# Patient Record
Sex: Male | Born: 1955 | Race: Black or African American | Hispanic: No | Marital: Single | Smoking: Former smoker
Health system: Southern US, Community
[De-identification: ages and names within clinical notes are randomized; demographics above are authoritative.]

## PROBLEM LIST (undated history)

## (undated) DIAGNOSIS — I5032 Chronic diastolic (congestive) heart failure: Secondary | ICD-10-CM

## (undated) DIAGNOSIS — Z972 Presence of dental prosthetic device (complete) (partial): Secondary | ICD-10-CM

## (undated) DIAGNOSIS — F1490 Cocaine use, unspecified, uncomplicated: Secondary | ICD-10-CM

## (undated) DIAGNOSIS — Z8679 Personal history of other diseases of the circulatory system: Secondary | ICD-10-CM

## (undated) DIAGNOSIS — N138 Other obstructive and reflux uropathy: Secondary | ICD-10-CM

## (undated) DIAGNOSIS — N189 Chronic kidney disease, unspecified: Secondary | ICD-10-CM

## (undated) DIAGNOSIS — N320 Bladder-neck obstruction: Secondary | ICD-10-CM

## (undated) DIAGNOSIS — D735 Infarction of spleen: Secondary | ICD-10-CM

## (undated) DIAGNOSIS — K759 Inflammatory liver disease, unspecified: Secondary | ICD-10-CM

## (undated) DIAGNOSIS — Z87898 Personal history of other specified conditions: Secondary | ICD-10-CM

## (undated) DIAGNOSIS — M1990 Unspecified osteoarthritis, unspecified site: Secondary | ICD-10-CM

## (undated) DIAGNOSIS — F1020 Alcohol dependence, uncomplicated: Secondary | ICD-10-CM

## (undated) DIAGNOSIS — E785 Hyperlipidemia, unspecified: Secondary | ICD-10-CM

## (undated) DIAGNOSIS — D563 Thalassemia minor: Secondary | ICD-10-CM

## (undated) DIAGNOSIS — N529 Male erectile dysfunction, unspecified: Secondary | ICD-10-CM

## (undated) DIAGNOSIS — F1411 Cocaine abuse, in remission: Secondary | ICD-10-CM

## (undated) DIAGNOSIS — C61 Malignant neoplasm of prostate: Secondary | ICD-10-CM

## (undated) DIAGNOSIS — J45909 Unspecified asthma, uncomplicated: Secondary | ICD-10-CM

## (undated) DIAGNOSIS — I10 Essential (primary) hypertension: Secondary | ICD-10-CM

## (undated) DIAGNOSIS — N401 Enlarged prostate with lower urinary tract symptoms: Secondary | ICD-10-CM

## (undated) HISTORY — PX: PROSTATE BIOPSY: SHX241

## (undated) HISTORY — PX: OTHER SURGICAL HISTORY: SHX169

## (undated) HISTORY — PX: TOTAL KNEE ARTHROPLASTY: SHX125

---
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
## 1998-03-07 ENCOUNTER — Emergency Department (HOSPITAL_COMMUNITY): Admission: EM | Admit: 1998-03-07 | Discharge: 1998-03-07 | Payer: Self-pay | Admitting: Emergency Medicine

## 2003-03-13 ENCOUNTER — Emergency Department (HOSPITAL_COMMUNITY): Admission: EM | Admit: 2003-03-13 | Discharge: 2003-03-13 | Payer: Self-pay | Admitting: Emergency Medicine

## 2006-11-03 ENCOUNTER — Emergency Department (HOSPITAL_COMMUNITY): Admission: EM | Admit: 2006-11-03 | Discharge: 2006-11-04 | Payer: Self-pay | Admitting: Emergency Medicine

## 2007-05-03 ENCOUNTER — Emergency Department (HOSPITAL_COMMUNITY): Admission: EM | Admit: 2007-05-03 | Discharge: 2007-05-03 | Payer: Self-pay | Admitting: Emergency Medicine

## 2007-06-04 ENCOUNTER — Emergency Department (HOSPITAL_COMMUNITY): Admission: EM | Admit: 2007-06-04 | Discharge: 2007-06-04 | Payer: Self-pay | Admitting: Emergency Medicine

## 2007-11-08 ENCOUNTER — Emergency Department (HOSPITAL_COMMUNITY): Admission: EM | Admit: 2007-11-08 | Discharge: 2007-11-08 | Payer: Self-pay | Admitting: Emergency Medicine

## 2008-04-23 ENCOUNTER — Emergency Department (HOSPITAL_COMMUNITY): Admission: EM | Admit: 2008-04-23 | Discharge: 2008-04-23 | Payer: Self-pay | Admitting: Emergency Medicine

## 2008-11-06 ENCOUNTER — Emergency Department (HOSPITAL_COMMUNITY): Admission: EM | Admit: 2008-11-06 | Discharge: 2008-11-06 | Payer: Self-pay | Admitting: Emergency Medicine

## 2009-08-21 ENCOUNTER — Encounter: Admission: RE | Admit: 2009-08-21 | Discharge: 2009-10-07 | Payer: Self-pay | Admitting: Orthopaedic Surgery

## 2010-08-25 ENCOUNTER — Ambulatory Visit (HOSPITAL_COMMUNITY): Admission: RE | Admit: 2010-08-25 | Discharge: 2010-08-25 | Payer: Self-pay | Admitting: Orthopaedic Surgery

## 2010-12-23 LAB — BODY FLUID CULTURE: Culture: NO GROWTH

## 2010-12-23 LAB — SYNOVIAL CELL COUNT + DIFF, W/ CRYSTALS: WBC, Synovial: 85 /mm3 (ref 0–200)

## 2011-06-24 ENCOUNTER — Other Ambulatory Visit (HOSPITAL_COMMUNITY): Payer: Self-pay | Admitting: Orthopaedic Surgery

## 2011-06-24 DIAGNOSIS — T148XXA Other injury of unspecified body region, initial encounter: Secondary | ICD-10-CM

## 2011-07-10 ENCOUNTER — Ambulatory Visit (HOSPITAL_COMMUNITY): Payer: Self-pay

## 2011-07-10 ENCOUNTER — Encounter (HOSPITAL_COMMUNITY)
Admission: RE | Admit: 2011-07-10 | Discharge: 2011-07-10 | Disposition: A | Discharge status: Home / Self Care | Payer: Medicaid Other | Source: Ambulatory Visit | Attending: Orthopaedic Surgery | Admitting: Orthopaedic Surgery

## 2011-07-10 DIAGNOSIS — T148XXA Other injury of unspecified body region, initial encounter: Secondary | ICD-10-CM

## 2011-07-10 DIAGNOSIS — IMO0002 Reserved for concepts with insufficient information to code with codable children: Secondary | ICD-10-CM | POA: Insufficient documentation

## 2011-07-10 DIAGNOSIS — M1710 Unilateral primary osteoarthritis, unspecified knee: Secondary | ICD-10-CM | POA: Insufficient documentation

## 2011-07-10 MED ORDER — TECHNETIUM TC 99M MEDRONATE IV KIT
25.0000 | PACK | Freq: Once | INTRAVENOUS | Status: AC | PRN
  Administered 2011-07-10: 25 via INTRAVENOUS

## 2011-07-24 LAB — URIC ACID: Uric Acid, Serum: 6.6

## 2011-07-24 LAB — SEDIMENTATION RATE: Sed Rate: 1

## 2011-07-24 LAB — SYNOVIAL CELL COUNT + DIFF, W/ CRYSTALS
Eosinophils-Synovial: 1
Lymphocytes-Synovial Fld: 14
Monocyte-Macrophage-Synovial Fluid: 81
Neutrophil, Synovial: 4
WBC, Synovial: 510 — ABNORMAL HIGH

## 2011-07-24 LAB — DIFFERENTIAL
Basophils Absolute: 1 — ABNORMAL HIGH
Lymphocytes Relative: 43
Lymphs Abs: 2.4
Neutro Abs: 1.5 — ABNORMAL LOW

## 2011-07-24 LAB — CBC
Platelets: 207
RDW: 16.4 — ABNORMAL HIGH
WBC: 5.6

## 2011-07-24 LAB — BODY FLUID CULTURE: Gram Stain: NONE SEEN

## 2011-09-08 ENCOUNTER — Emergency Department (HOSPITAL_COMMUNITY): Admission: EM | Admit: 2011-09-08 | Discharge: 2011-09-08 | Discharge status: Absent without leave | Payer: Self-pay

## 2015-11-26 ENCOUNTER — Emergency Department (HOSPITAL_COMMUNITY): Payer: Medicaid Other

## 2015-11-26 ENCOUNTER — Observation Stay (HOSPITAL_COMMUNITY)
Admission: EM | Admit: 2015-11-26 | Discharge: 2015-11-28 | Disposition: A | Discharge status: Home / Self Care | Payer: Medicaid Other | Attending: Internal Medicine | Admitting: Internal Medicine

## 2015-11-26 ENCOUNTER — Encounter (HOSPITAL_COMMUNITY): Payer: Self-pay

## 2015-11-26 DIAGNOSIS — Z7901 Long term (current) use of anticoagulants: Secondary | ICD-10-CM | POA: Diagnosis not present

## 2015-11-26 DIAGNOSIS — R1013 Epigastric pain: Secondary | ICD-10-CM | POA: Diagnosis present

## 2015-11-26 DIAGNOSIS — D563 Thalassemia minor: Secondary | ICD-10-CM | POA: Diagnosis present

## 2015-11-26 DIAGNOSIS — F1410 Cocaine abuse, uncomplicated: Secondary | ICD-10-CM | POA: Diagnosis not present

## 2015-11-26 DIAGNOSIS — E8729 Other acidosis: Secondary | ICD-10-CM | POA: Diagnosis present

## 2015-11-26 DIAGNOSIS — I2510 Atherosclerotic heart disease of native coronary artery without angina pectoris: Secondary | ICD-10-CM | POA: Insufficient documentation

## 2015-11-26 DIAGNOSIS — I10 Essential (primary) hypertension: Secondary | ICD-10-CM | POA: Diagnosis not present

## 2015-11-26 DIAGNOSIS — E872 Acidosis: Secondary | ICD-10-CM

## 2015-11-26 DIAGNOSIS — Z7982 Long term (current) use of aspirin: Secondary | ICD-10-CM | POA: Diagnosis not present

## 2015-11-26 DIAGNOSIS — D735 Infarction of spleen: Principal | ICD-10-CM | POA: Insufficient documentation

## 2015-11-26 DIAGNOSIS — F10220 Alcohol dependence with intoxication, uncomplicated: Secondary | ICD-10-CM

## 2015-11-26 DIAGNOSIS — D509 Iron deficiency anemia, unspecified: Secondary | ICD-10-CM | POA: Diagnosis not present

## 2015-11-26 DIAGNOSIS — F1911 Other psychoactive substance abuse, in remission: Secondary | ICD-10-CM | POA: Diagnosis present

## 2015-11-26 DIAGNOSIS — R718 Other abnormality of red blood cells: Secondary | ICD-10-CM | POA: Diagnosis not present

## 2015-11-26 DIAGNOSIS — F1020 Alcohol dependence, uncomplicated: Secondary | ICD-10-CM | POA: Diagnosis not present

## 2015-11-26 HISTORY — DX: Essential (primary) hypertension: I10

## 2015-11-26 LAB — COMPREHENSIVE METABOLIC PANEL
ALT: 27 U/L (ref 17–63)
ANION GAP: 20 — AB (ref 5–15)
AST: 31 U/L (ref 15–41)
Albumin: 3.7 g/dL (ref 3.5–5.0)
Alkaline Phosphatase: 54 U/L (ref 38–126)
BUN: 14 mg/dL (ref 6–20)
CHLORIDE: 107 mmol/L (ref 101–111)
CO2: 18 mmol/L — ABNORMAL LOW (ref 22–32)
Calcium: 9.6 mg/dL (ref 8.9–10.3)
Creatinine, Ser: 0.82 mg/dL (ref 0.61–1.24)
Glucose, Bld: 134 mg/dL — ABNORMAL HIGH (ref 65–99)
POTASSIUM: 3.9 mmol/L (ref 3.5–5.1)
Sodium: 145 mmol/L (ref 135–145)
Total Bilirubin: 0.7 mg/dL (ref 0.3–1.2)
Total Protein: 7.2 g/dL (ref 6.5–8.1)

## 2015-11-26 LAB — I-STAT TROPONIN, ED: Troponin i, poc: 0 ng/mL (ref 0.00–0.08)

## 2015-11-26 LAB — URINALYSIS, ROUTINE W REFLEX MICROSCOPIC
Bilirubin Urine: NEGATIVE
GLUCOSE, UA: NEGATIVE mg/dL
Hgb urine dipstick: NEGATIVE
Ketones, ur: 15 mg/dL — AB
LEUKOCYTES UA: NEGATIVE
NITRITE: NEGATIVE
PH: 7 (ref 5.0–8.0)
Protein, ur: NEGATIVE mg/dL
SPECIFIC GRAVITY, URINE: 1.023 (ref 1.005–1.030)

## 2015-11-26 LAB — CBC
HEMATOCRIT: 40.9 % (ref 39.0–52.0)
HEMOGLOBIN: 13.4 g/dL (ref 13.0–17.0)
MCH: 21.6 pg — ABNORMAL LOW (ref 26.0–34.0)
MCHC: 32.8 g/dL (ref 30.0–36.0)
MCV: 65.9 fL — AB (ref 78.0–100.0)
Platelets: 187 10*3/uL (ref 150–400)
RBC: 6.21 MIL/uL — AB (ref 4.22–5.81)
RDW: 17.3 % — ABNORMAL HIGH (ref 11.5–15.5)
WBC: 6.3 10*3/uL (ref 4.0–10.5)

## 2015-11-26 LAB — LIPASE, BLOOD: LIPASE: 21 U/L (ref 11–51)

## 2015-11-26 LAB — RAPID URINE DRUG SCREEN, HOSP PERFORMED
Amphetamines: NOT DETECTED
BARBITURATES: NOT DETECTED
Benzodiazepines: NOT DETECTED
Cocaine: POSITIVE — AB
Opiates: NOT DETECTED
TETRAHYDROCANNABINOL: NOT DETECTED

## 2015-11-26 LAB — LACTIC ACID, PLASMA: LACTIC ACID, VENOUS: 1.5 mmol/L (ref 0.5–2.0)

## 2015-11-26 LAB — ETHANOL: Alcohol, Ethyl (B): 107 mg/dL — ABNORMAL HIGH (ref <5)

## 2015-11-26 MED ORDER — OXYCODONE-ACETAMINOPHEN 5-325 MG PO TABS
1.0000 | ORAL_TABLET | Freq: Once | ORAL | Status: AC
Start: 2015-11-26 — End: 2015-11-26
  Administered 2015-11-26: 1 via ORAL

## 2015-11-26 MED ORDER — ASPIRIN 81 MG PO CHEW
81.0000 mg | CHEWABLE_TABLET | Freq: Every day | ORAL | Status: DC
  Administered 2015-11-26 – 2015-11-27 (×2): 81 mg via ORAL
  Filled 2015-11-26 (×2): qty 1

## 2015-11-26 MED ORDER — PANTOPRAZOLE SODIUM 40 MG PO TBEC
40.0000 mg | DELAYED_RELEASE_TABLET | Freq: Once | ORAL | Status: AC
  Administered 2015-11-26: 40 mg via ORAL
  Filled 2015-11-26: qty 1

## 2015-11-26 MED ORDER — THIAMINE HCL 100 MG/ML IJ SOLN: 100.0000 mg | Freq: Every day | INTRAMUSCULAR | Status: DC

## 2015-11-26 MED ORDER — LORAZEPAM 1 MG PO TABS: 1.0000 mg | ORAL_TABLET | Freq: Four times a day (QID) | ORAL | Status: DC | PRN

## 2015-11-26 MED ORDER — GI COCKTAIL ~~LOC~~
30.0000 mL | Freq: Once | ORAL | Status: AC
  Administered 2015-11-26: 30 mL via ORAL
  Filled 2015-11-26: qty 30

## 2015-11-26 MED ORDER — DIPHENHYDRAMINE HCL 25 MG PO CAPS
50.0000 mg | ORAL_CAPSULE | Freq: Once | ORAL | Status: AC
  Administered 2015-11-26: 50 mg via ORAL
  Filled 2015-11-26: qty 2

## 2015-11-26 MED ORDER — SODIUM CHLORIDE 0.9 % IV SOLN
Freq: Once | INTRAVENOUS | Status: AC
  Administered 2015-11-26: 19:00:00 via INTRAVENOUS

## 2015-11-26 MED ORDER — HYDROMORPHONE HCL 1 MG/ML IJ SOLN
0.5000 mg | Freq: Once | INTRAMUSCULAR | Status: AC
  Administered 2015-11-26: 0.5 mg via INTRAVENOUS
  Filled 2015-11-26: qty 1

## 2015-11-26 MED ORDER — OXYCODONE-ACETAMINOPHEN 5-325 MG PO TABS
ORAL_TABLET | ORAL | Status: AC
  Filled 2015-11-26: qty 1

## 2015-11-26 MED ORDER — IOHEXOL 300 MG/ML  SOLN
100.0000 mL | Freq: Once | INTRAMUSCULAR | Status: AC | PRN
  Administered 2015-11-26: 100 mL via INTRAVENOUS

## 2015-11-26 MED ORDER — SODIUM CHLORIDE 0.9 % IV SOLN
INTRAVENOUS | Status: DC
  Administered 2015-11-27 (×2): via INTRAVENOUS

## 2015-11-26 MED ORDER — SODIUM CHLORIDE 0.9 % IV BOLUS (SEPSIS)
1000.0000 mL | Freq: Once | INTRAVENOUS | Status: AC
  Administered 2015-11-26: 1000 mL via INTRAVENOUS

## 2015-11-26 MED ORDER — ENOXAPARIN SODIUM 40 MG/0.4ML ~~LOC~~ SOLN
40.0000 mg | SUBCUTANEOUS | Status: DC
  Administered 2015-11-26: 40 mg via SUBCUTANEOUS
  Filled 2015-11-26: qty 0.4

## 2015-11-26 MED ORDER — HYDROMORPHONE HCL 1 MG/ML IJ SOLN
1.0000 mg | Freq: Once | INTRAMUSCULAR | Status: AC
  Administered 2015-11-26: 1 mg via INTRAVENOUS
  Filled 2015-11-26: qty 1

## 2015-11-26 MED ORDER — OXYCODONE HCL 5 MG PO TABS: 5.0000 mg | ORAL_TABLET | ORAL | Status: DC | PRN

## 2015-11-26 MED ORDER — DIPHENHYDRAMINE HCL 25 MG PO CAPS
25.0000 mg | ORAL_CAPSULE | ORAL | Status: DC | PRN
  Administered 2015-11-27 (×2): 25 mg via ORAL
  Filled 2015-11-26 (×3): qty 1

## 2015-11-26 MED ORDER — HYDROMORPHONE HCL 1 MG/ML IJ SOLN
1.0000 mg | INTRAMUSCULAR | Status: DC | PRN
  Administered 2015-11-26: 1 mg via INTRAVENOUS
  Filled 2015-11-26: qty 1

## 2015-11-26 MED ORDER — VITAMIN B-1 100 MG PO TABS
100.0000 mg | ORAL_TABLET | Freq: Every day | ORAL | Status: DC
  Administered 2015-11-26 – 2015-11-28 (×3): 100 mg via ORAL
  Filled 2015-11-26 (×3): qty 1

## 2015-11-26 MED ORDER — HYDROCHLOROTHIAZIDE 12.5 MG PO CAPS
12.5000 mg | ORAL_CAPSULE | Freq: Every day | ORAL | Status: DC
  Administered 2015-11-26 – 2015-11-28 (×3): 12.5 mg via ORAL
  Filled 2015-11-26 (×3): qty 1

## 2015-11-26 MED ORDER — ADULT MULTIVITAMIN W/MINERALS CH
1.0000 | ORAL_TABLET | Freq: Every day | ORAL | Status: DC
  Administered 2015-11-26 – 2015-11-28 (×3): 1 via ORAL
  Filled 2015-11-26 (×3): qty 1

## 2015-11-26 MED ORDER — FOLIC ACID 1 MG PO TABS
1.0000 mg | ORAL_TABLET | Freq: Every day | ORAL | Status: DC
  Administered 2015-11-26 – 2015-11-28 (×3): 1 mg via ORAL
  Filled 2015-11-26 (×3): qty 1

## 2015-11-26 MED ORDER — LORAZEPAM 2 MG/ML IJ SOLN: 1.0000 mg | Freq: Four times a day (QID) | INTRAMUSCULAR | Status: DC | PRN

## 2015-11-26 MED ORDER — LORAZEPAM 2 MG/ML IJ SOLN
1.0000 mg | Freq: Once | INTRAMUSCULAR | Status: AC
  Administered 2015-11-26: 1 mg via INTRAVENOUS
  Filled 2015-11-26: qty 1

## 2015-11-26 NOTE — ED Notes (Signed)
Patient here with acute onset on epigastric pain this am, reports cramping and sharp. No nausea, no vomiting, no diarrhea. Reports that he drinks daily

## 2015-11-26 NOTE — ED Notes (Signed)
Pt returns from ct  

## 2015-11-26 NOTE — ED Provider Notes (Signed)
CSN: [id number]     Arrival date & time 11/26/15  1215 History   First MD Initiated Contact with Patient 11/26/15 1519     Chief Complaint  Patient presents with  . Abdominal Pain   Patient is a 60 y.o. male presenting with abdominal pain. The history is provided by the patient.  Abdominal Pain Pain location:  Epigastric Pain quality: aching and sharp   Pain radiates to:  Does not radiate Pain severity:  Severe Onset quality:  Gradual Duration:  1 day Timing:  Intermittent Progression:  Unchanged Chronicity:  New Context: alcohol use   Context: not awakening from sleep, not diet changes, not eating, not previous surgeries and not sick contacts   Relieved by:  None tried Worsened by:  Nothing tried Ineffective treatments:  None tried Associated symptoms: no anorexia, no chest pain, no chills, no constipation, no cough, no diarrhea, no dysuria, no fever, no hematuria, no nausea, no shortness of breath and no vomiting   Risk factors: alcohol abuse   Risk factors: has not had multiple surgeries     Past Medical History  Diagnosis Date  . Hypertension    History reviewed. No pertinent past surgical history. No family history on file. Social History  Substance Use Topics  . Smoking status: Never Smoker   . Smokeless tobacco: None  . Alcohol Use: Yes    Review of Systems  Constitutional: Negative for fever and chills.  HENT: Negative for congestion and dental problem.   Eyes: Negative for pain, discharge and itching.  Respiratory: Negative for cough and shortness of breath.   Cardiovascular: Negative for chest pain.  Gastrointestinal: Positive for abdominal pain. Negative for nausea, vomiting, diarrhea, constipation, blood in stool and anorexia.  Endocrine: Negative for polydipsia and polyuria.  Genitourinary: Negative for dysuria, urgency, hematuria and decreased urine volume.  Musculoskeletal: Negative for neck pain and neck stiffness.  Skin: Negative for rash and wound.   Allergic/Immunologic: Negative for immunocompromised state.  Neurological: Negative for light-headedness, numbness and headaches.  Psychiatric/Behavioral: Negative for behavioral problems and agitation.  All other systems reviewed and are negative.     Allergies  Morphine and related  Home Medications   Prior to Admission medications   Not on File   BP 157/101 mmHg  Pulse 86  Temp(Src) 98.1 F (36.7 C) (Oral)  Resp 20  SpO2 99% Physical Exam  Constitutional: He is oriented to person, place, and time. He appears well-developed and well-nourished. No distress.  HENT:  Head: Normocephalic and atraumatic.  Eyes: Pupils are equal, round, and reactive to light.  Neck: Normal range of motion.  Cardiovascular: Normal rate and regular rhythm.  Exam reveals no friction rub.   No murmur heard. Pulmonary/Chest: Effort normal and breath sounds normal. No respiratory distress. He has no wheezes. He has no rales. He exhibits no tenderness.  Abdominal: Soft. Bowel sounds are normal. He exhibits no distension. There is no tenderness. There is no rebound and no guarding.  Genitourinary: Guaiac negative stool.  Musculoskeletal: Normal range of motion. He exhibits no edema or tenderness.  Neurological: He is alert and oriented to person, place, and time. No cranial nerve deficit. Coordination normal.  Skin: Skin is warm and dry. He is not diaphoretic.  Psychiatric: He has a normal mood and affect. His behavior is normal.  Vitals reviewed.   ED Course  Procedures (including critical care time) Labs Review Labs Reviewed  COMPREHENSIVE METABOLIC PANEL - Abnormal; Notable for the following:    CO2  18 (*)    Glucose, Bld 134 (*)    Anion gap 20 (*)    All other components within normal limits  CBC - Abnormal; Notable for the following:    RBC 6.21 (*)    MCV 65.9 (*)    MCH 21.6 (*)    RDW 17.3 (*)    All other components within normal limits  LIPASE, BLOOD  URINALYSIS, ROUTINE W  REFLEX MICROSCOPIC (NOT AT [facility])  I-STAT TROPOININ, ED  POC OCCULT BLOOD, ED    Imaging Review No results found. I have personally reviewed and evaluated these images and lab results as part of my medical decision-making.   EKG Interpretation None      MDM   Final diagnoses:  Epigastric pain   Patient is a 60 year old man who presents via private vehicle for evaluation treatment of epigastric pain that is intermittent, sharp in nature. It occurred at rest this morning. Patient never had this pain before. Patient denies any nausea vomiting or diarrhea.  Patient does have history of hypertension.  Upon arrival patient afebrile with heart rate 100, blood pressure 137/108. He has a soft nontender abdomen. Hemoccult negative.  Differential diagnosis includes pancreatitis versus gastritis. Do not suspect small bowel obstruction.  Patient given Percocet in triage with minimal relief. Patient given Maalox here with minimal relief. Patient also given Protonix emergency department.  Patient is a heavy alcohol user and drinks a sixpack as well as several 40s per day. Strongly suspect gastritis due to alcohol use. Patient given IV fluids, Ativan to prevent withdrawals and dilaudid for pain.  5:18 PM: Discussed case with hospitalist who requests CT abdomen and pelvis, UDS, ethanol level. Ordered these tests and will call back with results.  CT results showed acute per subacute splenic infarct. Discussed with hospitalist who will admit patient for fluid hydration, evaluation of splenic infarct. Patient given pain control while in the emergency department with Dilaudid.  He is admitted to the hospital service with no further emergency department events.  Discussed case with my attending Dr. Roxanne Mins.  [place] Blanco, [place] [phone]  Delora Fuel, [place] [id number] [phone]

## 2015-11-26 NOTE — H&P (Signed)
History and Physical  Patient Name: Bradley Ray     [id number]    DOB: 1956-05-09    DOA: 11/26/2015 Referring physician: Vira Blanco, MD PCP: Philis Fendt, MD      Chief Complaint: Epigastric pain  HPI: Bradley Ray is a 60 y.o. male with a past medical history significant for HTN and alcoholism who presents with acute epigastric pain.  The patient was in his usual state of health until this morning, when he had sudden onset epigastric pain.  The pain was severe, constant, and worsened through the day until he couldn't do anything but go to the ER.  It was not associated with vomiting, diarrhea, fever.  It was not worse with food.  In the ED, the patient was afebrile, tachycardic, and hypertensive.  K 3.9, HCO3 18, AG 20, Cr 0.8. Lipase normal.  AST/ALT and bili normal.  WBC 6.3, Hgb 13.4 but marked microcytosis.  TNI normal.  EtOH 107.  UDS positive for cocaine.  A CT of the abdomen and pelvis with contrast was remarkable only for a splenic infarct.  Fluids were administered, hydromorphone was given for pain control, and TRH were asked to evaluate for admission.  The patient reported ~2 pints of spirits plus 12 pack of beer plus 1-2 40 oz beverages daily.  He has a long history of alcohol.  He specifically denied cocaine and IVDU, despite UDS results.  No personal history of clot (except reported taking a blood thinner years ago in context of knee replacement, he is unclear if he had DVT/PE).  No family history of thrombosis.  Family history of sickle cell disease and trait, no known personal history.       Review of Systems:  Pt complains of abdominal pain, tremor, diaphoresis.  Some weight loss in context of depression.   Pt denies any dysphagia, unexplained weight loss, swollen glands, B symptoms.  All other systems negative except as just noted or noted in the history of present illness.  Allergies  Allergen Reactions  . Morphine And Related Rash    Prior to  Admission medications   Not on File    Past Medical History  Diagnosis Date  . Hypertension     Past Surgical History  Procedure Laterality Date  . Total knee arthroplasty      Family history: family history includes Arrhythmia in his mother; Pancreatic cancer in his sister; Sickle cell trait in his son; Throat cancer in his father.  No family history of thrombosis. No family history of anemia other than SCD.  Social History: Patient lives alone.  He is on Disability.  He drinks daily (see above).  He denies IVDU.  He walks without a cane or walker.    His partner of 22 years recently left him, and he has been depressed.     Physical Exam: BP 168/93 mmHg  Pulse 114  Temp(Src) 98.1 F (36.7 C) (Oral)  Resp 23  SpO2 96% General appearance: Well-developed, adult male, alert and in no acute distress.   Eyes: Anicteric, conjunctiva pink, lids and lashes normal.  Pupils small, symmetric. ENT: No nasal deformity, discharge, or epistaxis.  OP moist without lesions.   Lymph: No cervical, axillary or supraclavicular lymphadenopathy. Skin: Warm and dry.  No jaundice.  No suspicious rashes or lesions.  No lesions on hands/palms or feet. Cardiac: RRR, nl S1-S2, no murmurs appreciated at all.  Capillary refill is brisk.  JVP normal.  No LE edema.  Radial and  DP pulses 2+ and symmetric. Respiratory: Normal respiratory rate and rhythm.  CTAB without rales or wheezes. Abdomen: Abdomen soft without rigidity.  No real TTP, some voluntary guarding. No ascites, distension.   MSK: No deformities or effusions. Neuro: Cranial nerves all normal.  Sensorium intact and responding to questions, attention normal.  Speech is fluent.  Moves all extremities equally and with normal coordination.   Mild tremor, mild diaphoresis. Psych: Behavior appropriate.  Affect normal.  No evidence of aural or visual hallucinations or delusions.       Labs on Admission:  The metabolic panel shows normal sodium,  potassium, and creatinine. The bicarb is low.  The AG is elevated. Lipase normal EtOH 107 Troponin negative. UDS positive for cocaine. UA only positive for ketones. The complete blood count shows no leukocytosis.  There is microcytosis, marked, without anemia.  Normal platelets.   Radiological Exams on Admission: Ct Abdomen Pelvis W Contrast 11/26/2015 IMPRESSION: Findings consistent with a splenic infarct, likely acute or subacute. Cause for the infarct is not identified. No other acute abnormality is seen. Calcific aortic and coronary atherosclerosis. Small nonobstructing stone lower pole left kidney. Electronically Signed   By: Inge Rise M.D.   On: 11/26/2015 17:59    EKG: Independently reviewed. Sinus tachycardia, rate 106.  QTc long normal 490.  No ST changes.      Assessment/Plan 1. Splenic infarct:  This is new.    Possible diagnostic considerations include IVDU/endocarditis, embolic atherosclerosis.  No afib on ECG, and arrhythmia is doubted.  Cocaine alone or sickle cell trait alone seem unlikely to cause splenic infarct, but are possible.  No evidence of DVT or malignancy. -Echocardiogram -Blood cultures   2. Anion gap acidosis:  This is new.  Suspect alcoholic ketoacidosis/lactic acidosis. -Supportive care with fluids -Check lactate -Repeat BMP in AM  3. Microcytosis:  Thalassemia possible.  Seems out of proportiion for sickle cell trait. -Could pursue Hgb electrophoresis as outpatient  4. Alcoholism:  -SW consult for alcohol recovery resources  5. HTN:  -Restart home HCTZ and aspirin     DVT PPx: Lovenox Diet: Regular Consultants: SW Code Status: FULL Family Communication: Sister, present at bedside  Medical decision making: What exists of the patient's previous chart was reviewed in depth and the case was discussed with Dr. Adela Glimpse. Patient seen 8:05 PM on 11/26/2015.  Disposition Plan:  I recommend admission to med surg bed.  Clinical  condition: stable.  Anticipate observation overnight, follow blood cultures and echocardiogram.  If echo normal, could conceivably be discharged home tomorrow with PCP follow up, +/- Holter monitor.      Edwin Dada [facility] Pager [phone]

## 2015-11-27 ENCOUNTER — Other Ambulatory Visit: Payer: Self-pay | Admitting: Hematology and Oncology

## 2015-11-27 ENCOUNTER — Encounter: Payer: Self-pay | Admitting: Hematology and Oncology

## 2015-11-27 ENCOUNTER — Other Ambulatory Visit (HOSPITAL_COMMUNITY): Payer: Medicaid Other

## 2015-11-27 DIAGNOSIS — I10 Essential (primary) hypertension: Secondary | ICD-10-CM | POA: Diagnosis not present

## 2015-11-27 DIAGNOSIS — D735 Infarction of spleen: Principal | ICD-10-CM

## 2015-11-27 DIAGNOSIS — E860 Dehydration: Secondary | ICD-10-CM | POA: Diagnosis not present

## 2015-11-27 DIAGNOSIS — R718 Other abnormality of red blood cells: Secondary | ICD-10-CM | POA: Diagnosis not present

## 2015-11-27 DIAGNOSIS — R109 Unspecified abdominal pain: Secondary | ICD-10-CM | POA: Diagnosis not present

## 2015-11-27 DIAGNOSIS — F1490 Cocaine use, unspecified, uncomplicated: Secondary | ICD-10-CM

## 2015-11-27 LAB — IRON AND TIBC
Iron: 58 ug/dL (ref 45–182)
Saturation Ratios: 15 % — ABNORMAL LOW (ref 17.9–39.5)
TIBC: 388 ug/dL (ref 250–450)
UIBC: 330 ug/dL

## 2015-11-27 LAB — CBC WITH DIFFERENTIAL/PLATELET
BASOS ABS: 0 10*3/uL (ref 0.0–0.1)
Basophils Relative: 0 %
EOS PCT: 2 %
Eosinophils Absolute: 0.1 10*3/uL (ref 0.0–0.7)
HEMATOCRIT: 41.1 % (ref 39.0–52.0)
HEMOGLOBIN: 13.5 g/dL (ref 13.0–17.0)
LYMPHS PCT: 33 %
Lymphs Abs: 1.4 10*3/uL (ref 0.7–4.0)
MCH: 21.9 pg — AB (ref 26.0–34.0)
MCHC: 32.8 g/dL (ref 30.0–36.0)
MCV: 66.6 fL — ABNORMAL LOW (ref 78.0–100.0)
MONOS PCT: 11 %
Monocytes Absolute: 0.5 10*3/uL (ref 0.1–1.0)
NEUTROS ABS: 2.3 10*3/uL (ref 1.7–7.7)
Neutrophils Relative %: 54 %
Platelets: 171 10*3/uL (ref 150–400)
RBC: 6.17 MIL/uL — AB (ref 4.22–5.81)
RDW: 17.2 % — ABNORMAL HIGH (ref 11.5–15.5)
WBC: 4.3 10*3/uL (ref 4.0–10.5)

## 2015-11-27 LAB — BASIC METABOLIC PANEL
ANION GAP: 13 (ref 5–15)
BUN: 8 mg/dL (ref 6–20)
CHLORIDE: 102 mmol/L (ref 101–111)
CO2: 23 mmol/L (ref 22–32)
Calcium: 9.2 mg/dL (ref 8.9–10.3)
Creatinine, Ser: 0.69 mg/dL (ref 0.61–1.24)
GFR calc Af Amer: >60 mL/min (ref >60)
GLUCOSE: 74 mg/dL (ref 65–99)
POTASSIUM: 4.5 mmol/L (ref 3.5–5.1)
Sodium: 138 mmol/L (ref 135–145)

## 2015-11-27 LAB — FERRITIN: FERRITIN: 624 ng/mL — AB (ref 24–336)

## 2015-11-27 MED ORDER — RIVAROXABAN 15 MG PO TABS
15.0000 mg | ORAL_TABLET | Freq: Two times a day (BID) | ORAL | Status: DC
  Administered 2015-11-27 – 2015-11-28 (×2): 15 mg via ORAL
  Filled 2015-11-27 (×2): qty 1

## 2015-11-27 MED ORDER — RIVAROXABAN 20 MG PO TABS: 20.0000 mg | ORAL_TABLET | Freq: Every day | ORAL | Status: DC

## 2015-11-27 MED ORDER — AMLODIPINE BESYLATE 5 MG PO TABS
5.0000 mg | ORAL_TABLET | Freq: Every day | ORAL | Status: DC
  Administered 2015-11-27 – 2015-11-28 (×2): 5 mg via ORAL
  Filled 2015-11-27 (×2): qty 1

## 2015-11-27 NOTE — Progress Notes (Signed)
ANTICOAGULATION CONSULT NOTE - Initial Consult  Pharmacy Consult for Xarelto Indication: Splenic infarct  Allergies  Allergen Reactions  . Morphine And Related Rash    Patient Measurements: Height: 5\' 5"  (165.1 cm) Weight: 159 lb 1.6 oz (72.167 kg) IBW/kg (Calculated) : 61.5  Vital Signs: Temp: 98.4 F (36.9 C) (02/15 1400) Temp Source: Oral (02/15 1400) BP: 155/92 mmHg (02/15 1400) Pulse Rate: 80 (02/15 1400)  Labs:  Recent Labs  11/26/15 1252 11/27/15 0730  HGB 13.4  --   HCT 40.9  --   PLT 187  --   CREATININE 0.82 0.69    Estimated Creatinine Clearance: 87.6 mL/min (by C-G formula based on Cr of 0.69).   Medical History: Past Medical History  Diagnosis Date  . Hypertension     Assessment: 60 yo M presented with complaints of gastric pain. CT showed splenic infarct. Pharmacy consulted to start Xarelto for DVT treatment. Last VTE prophylactic lovenox dose was yesterday. Hgb stable, plts wnl. No s/s of bleed. SCr stable, CrCL ~65ml/min.  Goal of Therapy:  Monitor platelets by anticoagulation protocol: Yes   Plan:  Start Xarelto 15mg  PO BID x 21 days Followed by Xarelto 20mg  PO daily Monitor CBC, s/s of bleed  Elenor Quinones, PharmD, BCPS Clinical Pharmacist Pager [phone] 11/27/2015 2:25 PM

## 2015-11-27 NOTE — Progress Notes (Signed)
[facility] PROGRESS NOTE  Bradley Ray [id number] DOB: 1956-02-06 DOA: 11/26/2015 PCP: Philis Fendt, MD  Assessment/Plan: 1. Splenic Infarct -Bradley Ray is a pleasant 60 year old gentleman presented to the emergency department with complaints of a gastric pain. Workup in the emergency room included a CT scan of abdomen and pelvis with IV contrast that revealed a splenic infarct. He denies having a history of atrial fibrillation. EKG and presentation showed normal sinus rhythm.  -He appears nontoxic, afebrile, blood cultures pending at the time of dictation although doubt this would be related to septic emboli from endocarditis. -Labs did reveal he was cocaine positive although this would be unusual. -Case was discussed with Dr. Alvy Bimler who reviewed labs, including CBC. Patient with microcytic anemia, Hg of 13.4. Will obtain CBC with differential. Dr Alvy Bimler recommended CML workup. She also recommended starting Bradley Ray on anticoagulation therapy, novel agent would be acceptable.  -Starting Xarelto.  -Await further recs from hematology   2.  Cocaine abuse. -Urine drug screen was positive for cocaine, patient extensively counseled  3.  Microcytic anemia -Labs showing MCV of 65.9. Pending CBC with differential -Hematology consulted  4.  Hypertension -Will start patient on Norvasc 5 mg by mouth daily  Code Status: Full code Family Communication: Family not present Disposition Plan: Hematology consulted, anticipate discharge in the next 24 hours if remains stable   Consultants:  Hematology   HPI/Subjective: Bradley. Ray is a pleasant 60 year old general with a past medical history of hypertension, alcoholism, admitted to medicine service on 11/26/2015 presented with complaints of epigastric pain. Reports symptoms were of sudden onset that occurred the day prior to admission. He denied bloody stools, melena, nausea, vomiting. Initial workup included a CT scan of abdomen with  IV contrast performed in the emergency department which revealed splenic infarct. Patient instruction was positive for cocaine. Case was discussed with Dr. Alvy Bimler who recommended    Objective: Filed Vitals:   11/27/15 0155 11/27/15 0635  BP: 153/94 148/98  Pulse: 89 90  Temp: 98.2 F (36.8 C) 98 F (36.7 C)  Resp: 19 19    Intake/Output Summary (Last 24 hours) at 11/27/15 1406 Last data filed at 11/27/15 1100  Gross per 24 hour  Intake 1024.58 ml  Output   2050 ml  Net -1025.42 ml   Filed Weights   11/26/15 2045  Weight: 72.167 kg (159 lb 1.6 oz)    Exam:   General:  No acute distress, patient is awake and alert oriented  Cardiovascular: Regular rate and rhythm normal S1-S2 no murmurs or gallops  Respiratory: Normal respiratory effort  Abdomen: Abdomen is soft nontender nondistended overall benign abdominal examination  Musculoskeletal: No edema  Data Reviewed: Basic Metabolic Panel:  Recent Labs Lab 11/26/15 1252 11/27/15 0730  NA 145 138  K 3.9 4.5  CL 107 102  CO2 18* 23  GLUCOSE 134* 74  BUN 14 8  CREATININE 0.82 0.69  CALCIUM 9.6 9.2   Liver Function Tests:  Recent Labs Lab 11/26/15 1252  AST 31  ALT 27  ALKPHOS 54  BILITOT 0.7  PROT 7.2  ALBUMIN 3.7    Recent Labs Lab 11/26/15 1252  LIPASE 21   No results for input(s): AMMONIA in the last 168 hours. CBC:  Recent Labs Lab 11/26/15 1252  WBC 6.3  HGB 13.4  HCT 40.9  MCV 65.9*  PLT 187   Cardiac Enzymes: No results for input(s): CKTOTAL, CKMB, CKMBINDEX, TROPONINI in the last 168 hours. BNP (last 3 results)  No results for input(s): BNP in the last 8760 hours.  ProBNP (last 3 results) No results for input(s): PROBNP in the last 8760 hours.  CBG: No results for input(s): GLUCAP in the last 168 hours.  Recent Results (from the past 240 hour(s))  Culture, blood (routine x 2)     Status: None (Preliminary result)   Collection Time: 11/26/15  8:05 PM  Result Value Ref  Range Status   Specimen Description BLOOD RIGHT ANTECUBITAL  Final   Special Requests BOTTLES DRAWN AEROBIC AND ANAEROBIC 5CC EA  Final   Culture NO GROWTH < 24 HOURS  Final   Report Status PENDING  Incomplete  Culture, blood (routine x 2)     Status: None (Preliminary result)   Collection Time: 11/26/15  8:05 PM  Result Value Ref Range Status   Specimen Description BLOOD RIGHT HAND  Final   Special Requests BOTTLES DRAWN AEROBIC AND ANAEROBIC 5CC EA  Final   Culture NO GROWTH < 24 HOURS  Final   Report Status PENDING  Incomplete     Studies: Ct Abdomen Pelvis W Contrast  11/26/2015  CLINICAL DATA:  Acute onset severe epigastric pain today. Initial encounter. EXAM: CT ABDOMEN AND PELVIS WITH CONTRAST TECHNIQUE: Multidetector CT imaging of the abdomen and pelvis was performed using the standard protocol following bolus administration of intravenous contrast. CONTRAST:  100 mL OMNIPAQUE IOHEXOL 300 MG/ML  SOLN COMPARISON:  None. FINDINGS: Mild dependent atelectasis is seen in the lung bases. No pleural or pericardial effusion. Calcific coronary artery disease is noted. Heart size is upper normal. The gallbladder, liver, right adrenal gland, pancreas and right kidney appear normal. A small nonobstructing stone is identified in the lower pole of the left kidney. Wedge-shaped peripheral hypoattenuation measuring approximately 2.5 cm transverse x 2.5 cm AP x 2.1 cm craniocaudal is seen in the posterior, superior spleen consistent with splenic infarct. Cause for the infarct is not identified. Scattered atherosclerosis without aneurysm is noted. The prostate gland is mildly prominent. Urinary bladder and seminal vesicles are unremarkable. The stomach and small and large bowel appear normal. There is no evidence of appendicitis. No lymphadenopathy or fluid is seen. No lytic or sclerotic bony lesion is identified. There is loss of disc space height with endplate spurring at 075-GRM. IMPRESSION: Findings  consistent with a splenic infarct, likely acute or subacute. Cause for the infarct is not identified. No other acute abnormality is seen. Calcific aortic and coronary atherosclerosis. Small nonobstructing stone lower pole left kidney. Electronically Signed   By: Inge Rise M.D.   On: 11/26/2015 17:59    Scheduled Meds: . enoxaparin (LOVENOX) injection  40 mg Subcutaneous Q24H  . folic acid  1 mg Oral Daily  . hydrochlorothiazide  12.5 mg Oral Daily  . multivitamin with minerals  1 tablet Oral Daily  . thiamine  100 mg Oral Daily   Continuous Infusions:   Active Problems:   Splenic infarct   Essential hypertension   Microcytosis   High anion gap metabolic acidosis   Alcohol dependence ([place])    Time spent: 30 min    Kelvin Cellar  [facility] Pager [phone]. If 7PM-7AM, please contact night-coverage at [email or web address], password [facility] 11/27/2015, 2:06 PM

## 2015-11-27 NOTE — Progress Notes (Signed)
[facility] CONSULT NOTE  Patient Care Team: Nolene Ebbs, MD as PCP - General (Internal Medicine)  CHIEF COMPLAINTS/PURPOSE OF CONSULTATION:  Splenic infarct  HISTORY OF PRESENTING ILLNESS:  Bradley Ray 60 y.o. male was admitted through the emergency room after presentation with severe epigastric pain. The patient was off sudden onset and was rated a 10 out of 10 at the time of presentation. Currently, he is pain-free. CT scan of the abdomen showed splenic infarct. The patient had no known risk factors for blood clots. He does not smoke now and does not use testosterone replacement therapy. There is positive family history in his sister which was thought to be provoked by surgeries. He denies any recent change in bowel habits. Denies nausea. In the ER, he was noted to have high alcohol level with testing positive for cocaine He denies recent dehydration No recent immobilization or surgery Denies recent chest pain or shortness of breath No recent leg swelling  MEDICAL HISTORY:  Past Medical History  Diagnosis Date  . Hypertension     SURGICAL HISTORY: Past Surgical History  Procedure Laterality Date  . Total knee arthroplasty      SOCIAL HISTORY: Social History   Social History  . Marital Status: Single    Spouse Name: N/A  . Number of Children: N/A  . Years of Education: N/A   Occupational History  . Not on file.   Social History Main Topics  . Smoking status: Never Smoker   . Smokeless tobacco: Never Used  . Alcohol Use: Yes  . Drug Use: Yes  . Sexual Activity: Not on file   Other Topics Concern  . Not on file   Social History Narrative    FAMILY HISTORY: Family History  Problem Relation Age of Onset  . Arrhythmia Mother     Has pacer  . Throat cancer Father   . Pancreatic cancer Sister   . Clotting disorder Sister     related knee surgeries  . Sickle cell trait Son     ALLERGIES:  is allergic to morphine and  related.  MEDICATIONS:  Current Facility-Administered Medications  Medication Dose Route Frequency Provider Last Rate Last Dose  . amLODipine (NORVASC) tablet 5 mg  5 mg Oral Daily Kelvin Cellar, MD      . diphenhydrAMINE (BENADRYL) capsule 25 mg  25 mg Oral Q4H PRN Rhetta Mura Schorr, NP   25 mg at 11/27/15 1124  . folic acid (FOLVITE) tablet 1 mg  1 mg Oral Daily Edwin Dada, MD   1 mg at 11/27/15 1115  . hydrochlorothiazide (MICROZIDE) capsule 12.5 mg  12.5 mg Oral Daily Edwin Dada, MD   12.5 mg at 11/27/15 1115  . HYDROmorphone (DILAUDID) injection 1 mg  1 mg Intravenous Q4H PRN Edwin Dada, MD   1 mg at 11/26/15 2153  . LORazepam (ATIVAN) tablet 1 mg  1 mg Oral Q6H PRN Edwin Dada, MD       Or  . LORazepam (ATIVAN) injection 1 mg  1 mg Intravenous Q6H PRN Edwin Dada, MD      . multivitamin with minerals tablet 1 tablet  1 tablet Oral Daily Edwin Dada, MD   1 tablet at 11/27/15 1117  . oxyCODONE (Oxy IR/ROXICODONE) immediate release tablet 5 mg  5 mg Oral Q4H PRN Edwin Dada, MD      . Rivaroxaban (XARELTO) tablet 15 mg  15 mg Oral BID WC Reginia Naas, [facility]  Followed by  . [START ON 12/19/2015] rivaroxaban (XARELTO) tablet 20 mg  20 mg Oral Q supper Cecilio Asper Batchelder, RPH      . thiamine (VITAMIN B-1) tablet 100 mg  100 mg Oral Daily Edwin Dada, MD   100 mg at 11/27/15 1115    REVIEW OF SYSTEMS:   Constitutional: Denies fevers, chills or abnormal night sweats Eyes: Denies blurriness of vision, double vision or watery eyes Ears, nose, mouth, throat, and face: Denies mucositis or sore throat Respiratory: Denies cough, dyspnea or wheezes Cardiovascular: Denies palpitation, chest discomfort or lower extremity swelling Gastrointestinal:  Denies nausea, heartburn or change in bowel habits Skin: Denies abnormal skin rashes Lymphatics: Denies new lymphadenopathy or easy  bruising Neurological:Denies numbness, tingling or new weaknesses Behavioral/Psych: Mood is stable, no new changes  All other systems were reviewed with the patient and are negative.  PHYSICAL EXAMINATION: ECOG PERFORMANCE STATUS: 0 - Asymptomatic  Filed Vitals:   11/27/15 0635 11/27/15 1400  BP: 148/98 155/92  Pulse: 90 80  Temp: 98 F (36.7 C) 98.4 F (36.9 C)  Resp: 19 20   Filed Weights   11/26/15 2045  Weight: 159 lb 1.6 oz (72.167 kg)    GENERAL:alert, no distress and comfortable SKIN: skin color, texture, turgor are normal, no rashes or significant lesions EYES: normal, conjunctiva are pink and non-injected, sclera clear OROPHARYNX:no exudate, no erythema and lips, buccal mucosa, and tongue normal  NECK: supple, thyroid normal size, non-tender, without nodularity LYMPH:  no palpable lymphadenopathy in the cervical, axillary or inguinal LUNGS: clear to auscultation and percussion with normal breathing effort HEART: regular rate & rhythm and no murmurs and no lower extremity edema ABDOMEN:abdomen soft, non-tender and normal bowel sounds. No palpable splenomegaly Musculoskeletal:no cyanosis of digits and no clubbing  PSYCH: alert & oriented x 3 with fluent speech NEURO: no focal motor/sensory deficits  LABORATORY DATA:  I have reviewed the data as listed Lab Results  Component Value Date   WBC 6.3 11/26/2015   HGB 13.4 11/26/2015   HCT 40.9 11/26/2015   MCV 65.9* 11/26/2015   PLT 187 11/26/2015    Recent Labs  11/26/15 1252 11/27/15 0730  NA 145 138  K 3.9 4.5  CL 107 102  CO2 18* 23  GLUCOSE 134* 74  BUN 14 8  CREATININE 0.82 0.69  CALCIUM 9.6 9.2  GFRNONAA >60 >60  GFRAA >60 >60  PROT 7.2  --   ALBUMIN 3.7  --   AST 31  --   ALT 27  --   ALKPHOS 54  --   BILITOT 0.7  --     RADIOGRAPHIC STUDIES: I have personally reviewed the radiological images as listed and agreed with the findings in the report. Ct Abdomen Pelvis W Contrast  11/26/2015   CLINICAL DATA:  Acute onset severe epigastric pain today. Initial encounter. EXAM: CT ABDOMEN AND PELVIS WITH CONTRAST TECHNIQUE: Multidetector CT imaging of the abdomen and pelvis was performed using the standard protocol following bolus administration of intravenous contrast. CONTRAST:  100 mL OMNIPAQUE IOHEXOL 300 MG/ML  SOLN COMPARISON:  None. FINDINGS: Mild dependent atelectasis is seen in the lung bases. No pleural or pericardial effusion. Calcific coronary artery disease is noted. Heart size is upper normal. The gallbladder, liver, right adrenal gland, pancreas and right kidney appear normal. A small nonobstructing stone is identified in the lower pole of the left kidney. Wedge-shaped peripheral hypoattenuation measuring approximately 2.5 cm transverse x 2.5 cm AP x 2.1 cm craniocaudal  is seen in the posterior, superior spleen consistent with splenic infarct. Cause for the infarct is not identified. Scattered atherosclerosis without aneurysm is noted. The prostate gland is mildly prominent. Urinary bladder and seminal vesicles are unremarkable. The stomach and small and large bowel appear normal. There is no evidence of appendicitis. No lymphadenopathy or fluid is seen. No lytic or sclerotic bony lesion is identified. There is loss of disc space height with endplate spurring at 075-GRM. IMPRESSION: Findings consistent with a splenic infarct, likely acute or subacute. Cause for the infarct is not identified. No other acute abnormality is seen. Calcific aortic and coronary atherosclerosis. Small nonobstructing stone lower pole left kidney. Electronically Signed   By: Inge Rise M.D.   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, could be related to cocaine use and dehydration with recent alcohol ingestion I recommend treatment with NOAC for minimum 3 months This is an highly unusual site for blood clot and I would recommend ordering testing to exclude  CML/PV as myeloproliferative disorder could cause splenic infarct I noted that his CBC is abnormal with microcytosis and his prior differential from 2008 show basophilia I will follow-up on these test results next week in the outpatient clinic  Microcytosis He is not symptomatic It could be related to undiagnosed thalassemia I will order additional workup for this  Discharge planning The patient can be discharged tomorrow I have tentatively placed in order to see him back in my office on 12/05/2015 around 11:00 to follow-up on the test results above Please call if questions arise  All questions were answered. The patient knows to call the clinic with any problems, questions or concerns.    [facility], [place], MD 11/27/2015 2:27 PM

## 2015-11-28 ENCOUNTER — Telehealth: Payer: Self-pay | Admitting: Hematology and Oncology

## 2015-11-28 ENCOUNTER — Observation Stay (HOSPITAL_BASED_OUTPATIENT_CLINIC_OR_DEPARTMENT_OTHER): Payer: Medicaid Other

## 2015-11-28 DIAGNOSIS — I10 Essential (primary) hypertension: Secondary | ICD-10-CM | POA: Diagnosis not present

## 2015-11-28 DIAGNOSIS — D735 Infarction of spleen: Secondary | ICD-10-CM | POA: Diagnosis not present

## 2015-11-28 HISTORY — PX: TRANSTHORACIC ECHOCARDIOGRAM: SHX275

## 2015-11-28 MED ORDER — RIVAROXABAN (XARELTO) VTE STARTER PACK (15 & 20 MG): ORAL_TABLET | ORAL | Status: DC

## 2015-11-28 MED ORDER — ATORVASTATIN CALCIUM 40 MG PO TABS: 40.0000 mg | ORAL_TABLET | Freq: Every day | ORAL | Status: AC

## 2015-11-28 MED ORDER — AMLODIPINE BESYLATE 10 MG PO TABS: 10.0000 mg | ORAL_TABLET | Freq: Every day | ORAL | Status: DC

## 2015-11-28 MED ORDER — ATORVASTATIN CALCIUM 40 MG PO TABS: 40.0000 mg | ORAL_TABLET | Freq: Every day | ORAL | Status: DC

## 2015-11-28 NOTE — Discharge Summary (Signed)
Physician Discharge Summary  Bradley Ray [id number] DOB: 07/10/1956 DOA: 11/26/2015  PCP: Philis Fendt, MD  Admit date: 11/26/2015 Discharge date: 11/28/2015  Time spent: 35 minutes  Recommendations for Outpatient Follow-up:  1. Patient admitted for splenic infarct, started on Xarelto during this hospitalization. Case manager consulted for prescription assistance.   2. Follow up on 2D Echo, results pending at the time of discharge.  3. Followup on blood pressures, he was hypertensive during this hospitalization     Discharge Diagnoses:  Active Problems:   Splenic infarct   Essential hypertension   Microcytosis   High anion gap metabolic acidosis   Alcohol dependence (Startex)   Discharge Condition: Stable  Diet recommendation: Heart Healthy  Filed Weights   11/26/15 2045  Weight: 72.167 kg (159 lb 1.6 oz)    History of present illness:  Bradley Ray is a 60 y.o. male with a past medical history significant for HTN and alcoholism who presents with acute epigastric pain.  The patient was in his usual state of health until this morning, when he had sudden onset epigastric pain. The pain was severe, constant, and worsened through the day until he couldn't do anything but go to the ER. It was not associated with vomiting, diarrhea, fever. It was not worse with food.  In the ED, the patient was afebrile, tachycardic, and hypertensive. K 3.9, HCO3 18, AG 20, Cr 0.8. Lipase normal. AST/ALT and bili normal. WBC 6.3, Hgb 13.4 but marked microcytosis. TNI normal. EtOH 107. UDS positive for cocaine.  A CT of the abdomen and pelvis with contrast was remarkable only for a splenic infarct. Fluids were administered, hydromorphone was given for pain control, and TRH were asked to evaluate for admission.  The patient reported ~2 pints of spirits plus 12 pack of beer plus 1-2 40 oz beverages daily. He has a long history of alcohol. He specifically denied cocaine and IVDU, despite  UDS results. No personal history of clot (except reported taking a blood thinner years ago in context of knee replacement, he is unclear if he had DVT/PE). No family history of thrombosis. Family history of sickle cell disease and trait, no known personal history.  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 Reports symptoms were of sudden onset that occurred the day prior to admission. He denied bloody stools, melena, nausea, vomiting. Initial workup included a CT scan of abdomen with IV contrast performed in the emergency department which revealed splenic infarct. Patient instruction was positive for cocaine. Case was discussed with Dr. Alvy Bimler who recommendedinitiating anticoagulation. She also recommended myelodysplastic workup. 2D Echo is pending at the time of this dictation, please follow up on his hospital follow up visit. Blood cultures remained sterile. He did not have further episodes of abdominal discomfort and was discharged on 11/28/2015 to his home in stable condition. Has a follow up appointment with Dr Alvy Bimler in week in the office.    Consultations:  Dr Alvy Bimler Hematology  Discharge Exam: Filed Vitals:   11/27/15 2132 11/28/15 0602  BP: 160/97 160/104  Pulse: 79 78  Temp: 98.6 F (37 C) 98.2 F (36.8 C)  Resp: 18 18    General: No acute distress, patient is awake and alert oriented  Cardiovascular: Regular rate and rhythm normal S1-S2 no murmurs or gallops  Respiratory: Normal respiratory effort  Abdomen: Abdomen is soft nontender  nondistended overall benign abdominal examination  Musculoskeletal: No edema  Discharge Instructions   Discharge Instructions    Call MD for:  difficulty breathing, headache or visual disturbances    Complete by:  As directed      Call MD for:  extreme fatigue     Complete by:  As directed      Call MD for:  hives    Complete by:  As directed      Call MD for:  persistant dizziness or light-headedness    Complete by:  As directed      Call MD for:  persistant nausea and vomiting    Complete by:  As directed      Call MD for:  redness, tenderness, or signs of infection (pain, swelling, redness, odor or green/yellow discharge around incision site)    Complete by:  As directed      Call MD for:  severe uncontrolled pain    Complete by:  As directed      Call MD for:  temperature >100.4    Complete by:  As directed      Call MD for:    Complete by:  As directed      Diet - low sodium heart healthy    Complete by:  As directed      Increase activity slowly    Complete by:  As directed           Current Discharge Medication List    START taking these medications   Details  amLODipine (NORVASC) 10 MG tablet Take 1 tablet (10 mg total) by mouth daily. Qty: 30 tablet, Refills: 1    atorvastatin (LIPITOR) 40 MG tablet Take 1 tablet (40 mg total) by mouth daily. Qty: 30 tablet, Refills: 1    Rivaroxaban (XARELTO STARTER PACK) 15 & 20 MG TBPK Take as directed on package: Start with one 15mg  tablet by mouth twice a day with food. On Day 22, switch to one 20mg  tablet once a day with food. Qty: 51 each, Refills: 0      CONTINUE these medications which have NOT CHANGED   Details  aspirin 81 MG tablet Take 81 mg by mouth daily.    hydrochlorothiazide (MICROZIDE) 12.5 MG capsule Take 12.5 mg by mouth daily.       Allergies  Allergen Reactions  . Morphine And Related Rash   Follow-up Information    Follow up with Philis Fendt, MD In 1 week.   Specialty:  Internal Medicine   Contact information:   [place] [address] [phone]       Follow up with Philis Fendt, MD.   Specialty:  Internal Medicine   Contact information:   [place] [address] [phone]       Follow up with [facility],  NI, MD On 12/05/2015.   Specialty:  Hematology and Oncology   Contact information:   [place] [id number] [phone]        The results of significant diagnostics from this hospitalization (including imaging, microbiology, ancillary and laboratory) are listed below for reference.    Significant Diagnostic Studies: Ct Abdomen Pelvis W Contrast  11/26/2015  CLINICAL DATA:  Acute onset severe epigastric pain today. Initial encounter. EXAM: CT ABDOMEN AND PELVIS WITH CONTRAST TECHNIQUE: Multidetector CT imaging of the abdomen and pelvis was performed using the standard protocol following bolus administration of intravenous contrast. CONTRAST:  100 mL OMNIPAQUE IOHEXOL 300 MG/ML  SOLN COMPARISON:  None. FINDINGS: Mild dependent atelectasis is seen in the lung bases. No pleural or pericardial effusion. Calcific coronary artery disease is noted. Heart size is upper normal. The gallbladder, liver, right adrenal gland, pancreas and right kidney appear normal. A small nonobstructing stone is identified in the lower pole of the left kidney. Wedge-shaped peripheral hypoattenuation measuring approximately 2.5 cm transverse x 2.5 cm AP x 2.1 cm craniocaudal is seen in the posterior, superior spleen consistent with splenic infarct. Cause for the infarct is not identified. Scattered atherosclerosis without aneurysm is noted. The prostate gland is mildly prominent. Urinary bladder and seminal vesicles are unremarkable. The stomach and small and large bowel appear normal. There is no evidence of appendicitis. No lymphadenopathy or fluid is seen. No lytic or sclerotic bony lesion is identified. There is loss of disc space height with endplate spurring at 075-GRM. IMPRESSION: Findings consistent with a splenic infarct, likely acute or subacute. Cause for the infarct is not identified. No other acute abnormality is seen. Calcific aortic and coronary atherosclerosis. Small nonobstructing stone lower pole  left kidney. Electronically Signed   By: Inge Rise M.D.   On: 11/26/2015 17:59    Microbiology: Recent Results (from the past 240 hour(s))  Culture, blood (routine x 2)     Status: None (Preliminary result)   Collection Time: 11/26/15  8:05 PM  Result Value Ref Range Status   Specimen Description BLOOD RIGHT ANTECUBITAL  Final   Special Requests BOTTLES DRAWN AEROBIC AND ANAEROBIC 5CC   Final   Culture NO GROWTH 2 DAYS  Final   Report Status PENDING  Incomplete  Culture, blood (routine x 2)     Status: None (Preliminary result)   Collection Time: 11/26/15  8:05 PM  Result Value Ref Range Status   Specimen Description BLOOD RIGHT HAND  Final   Special Requests BOTTLES DRAWN AEROBIC AND ANAEROBIC 5CC  Final   Culture NO GROWTH 2 DAYS  Final   Report Status PENDING  Incomplete     Labs: Basic Metabolic Panel:  Recent Labs Lab 11/26/15 1252 11/27/15 0730  NA 145 138  K 3.9 4.5  CL 107 102  CO2 18* 23  GLUCOSE 134* 74  BUN 14 8  CREATININE 0.82 0.69  CALCIUM 9.6 9.2   Liver Function Tests:  Recent Labs Lab 11/26/15 1252  AST 31  ALT 27  ALKPHOS 54  BILITOT 0.7  PROT 7.2  ALBUMIN 3.7    Recent Labs Lab 11/26/15 1252  LIPASE 21   No results for input(s): AMMONIA in the last 168 hours. CBC:  Recent Labs Lab 11/26/15 1252 11/27/15 1549  WBC 6.3 4.3  NEUTROABS  --  2.3  HGB 13.4 13.5  HCT 40.9 41.1  MCV 65.9* 66.6*  PLT 187 171   Cardiac Enzymes: No results for input(s): CKTOTAL, CKMB, CKMBINDEX, TROPONINI in the last 168 hours. BNP: BNP (last 3 results) No results for input(s): BNP in the last 8760 hours.  ProBNP (last 3 results) No results for input(s): PROBNP in the last 8760 hours.  CBG: No results for input(s): GLUCAP in the last 168 hours.     Signed:  Kelvin Cellar MD.  [facility] 11/28/2015, 1:31 PM

## 2015-11-28 NOTE — Discharge Instructions (Signed)
Information on my medicine - XARELTO (rivaroxaban)  This medication education was reviewed with me or my healthcare representative as part of my discharge preparation.   WHY WAS XARELTO PRESCRIBED FOR YOU? Xarelto was prescribed to treat blood clots that may have been found in the veins of your legs (deep vein thrombosis) or in your lungs (pulmonary embolism) and to reduce the risk of them occurring again.  What do you need to know about Xarelto? The starting dose is one 15 mg tablet taken TWICE daily with food for the FIRST 21 DAYS then on 3/9  the dose is changed to one 20 mg tablet taken ONCE A DAY with your evening meal.  DO NOT stop taking Xarelto without talking to the health care provider who prescribed the medication.  Refill your prescription for 20 mg tablets before you run out.  After discharge, you should have regular check-up appointments with your healthcare provider that is prescribing your Xarelto.  In the future your dose may need to be changed if your kidney function changes by a significant amount.  What do you do if you miss a dose? If you are taking Xarelto TWICE DAILY and you miss a dose, take it as soon as you remember. You may take two 15 mg tablets (total 30 mg) at the same time then resume your regularly scheduled 15 mg twice daily the next day.  If you are taking Xarelto ONCE DAILY and you miss a dose, take it as soon as you remember on the same day then continue your regularly scheduled once daily regimen the next day. Do not take two doses of Xarelto at the same time.   Important Safety Information Xarelto is a blood thinner medicine that can cause bleeding. You should call your healthcare provider right away if you experience any of the following: ? Bleeding from an injury or your nose that does not stop. ? Unusual colored urine (red or dark brown) or unusual colored stools (red or black). ? Unusual bruising for unknown reasons. ? A serious fall or if  you hit your head (even if there is no bleeding).  Some medicines may interact with Xarelto and might increase your risk of bleeding while on Xarelto. To help avoid this, consult your healthcare provider or pharmacist prior to using any new prescription or non-prescription medications, including herbals, vitamins, non-steroidal anti-inflammatory drugs (NSAIDs) and supplements.  This website has more information on Xarelto: [email or web address]-benson.[email or web address]

## 2015-11-28 NOTE — Telephone Encounter (Signed)
Spoke with pt's  Nurse in ref to hosp, f/u on 12/05/15 @10 :30.-per Dr. Alvy Bimler Mailed np packet to pt

## 2015-11-28 NOTE — Care Management (Signed)
Consult for Xarelto. Confirmed with [employer] Xarelto rep , Xarelto is on Medicaid Preferred List , co pay is $3. Spoke with patient , confirmed he does have Medicaid .    Magdalen Spatz RN BSN [phone]

## 2015-11-28 NOTE — Progress Notes (Signed)
Discharge paperwork given to patient. IV removed. Prescriptions given. No questions verbalized.

## 2015-11-28 NOTE — Progress Notes (Signed)
  Echocardiogram 2D Echocardiogram has been performed.  Diamond Nickel 11/28/2015, 12:37 PM

## 2015-11-29 LAB — HEMOGLOBINOPATHY EVALUATION
HGB A2 QUANT: 4.8 % — AB (ref 0.7–3.1)
HGB F QUANT: 0 % (ref 0.0–2.0)
HGB S QUANTITAION: 0 %
Hgb A: 95.2 % (ref 94.0–98.0)
Hgb C: 0 %

## 2015-12-01 LAB — CULTURE, BLOOD (ROUTINE X 2)
CULTURE: NO GROWTH
CULTURE: NO GROWTH

## 2015-12-02 LAB — JAK2 GENOTYPR

## 2015-12-05 ENCOUNTER — Encounter: Payer: Self-pay | Admitting: Hematology and Oncology

## 2015-12-05 ENCOUNTER — Ambulatory Visit (HOSPITAL_BASED_OUTPATIENT_CLINIC_OR_DEPARTMENT_OTHER): Payer: Medicaid Other | Admitting: Hematology and Oncology

## 2015-12-05 ENCOUNTER — Telehealth: Payer: Self-pay | Admitting: Hematology and Oncology

## 2015-12-05 VITALS — BP 123/78 | HR 74 | Temp 98.3°F | Resp 18 | Ht 65.0 in | Wt 164.3 lb

## 2015-12-05 DIAGNOSIS — D563 Thalassemia minor: Secondary | ICD-10-CM | POA: Diagnosis not present

## 2015-12-05 DIAGNOSIS — F1911 Other psychoactive substance abuse, in remission: Secondary | ICD-10-CM

## 2015-12-05 DIAGNOSIS — R718 Other abnormality of red blood cells: Secondary | ICD-10-CM

## 2015-12-05 DIAGNOSIS — D735 Infarction of spleen: Secondary | ICD-10-CM | POA: Diagnosis not present

## 2015-12-05 DIAGNOSIS — F1910 Other psychoactive substance abuse, uncomplicated: Secondary | ICD-10-CM

## 2015-12-05 LAB — BCR-ABL1, CML/ALL, PCR, QUANT

## 2015-12-05 NOTE — Telephone Encounter (Signed)
per pof to sch pt appt-gave pt copy of avs °

## 2015-12-05 NOTE — Assessment & Plan Note (Signed)
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 Multiple investigations were performed. Ultimately, I felt that the most likely cause of the splenic infarct was related to cocaine abuse.  Additional screening past for myeloproliferative disorder were negative  He will need minimum 3 months of anticoagulation therapy. I recommend he continue Xarelto until end of May 2017. Afterwards, I recommend he switch to 162 mg of aspirin. I will bring him back at the end of June to repeat blood work and D-dimer test If d-dimer is elevated, he might need extended duration of anticoagulation therapy

## 2015-12-05 NOTE — Assessment & Plan Note (Signed)
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 I explained to the patient and his daughter the genetic implication to multiple family members

## 2015-12-05 NOTE — Assessment & Plan Note (Signed)
He had history of substance abuse with alcoholism and recent illicit drug use with cocaine I spent some time explaining to the patient the importance of staying abstinent especially in view that the cocaine could be the contributing factor to the splenic infarct.  the patient reassured me that he will not used illicit drugs anymore and will refrain from drinking excessively

## 2015-12-05 NOTE — Progress Notes (Signed)
[facility] Ray OFFICE PROGRESS NOTE  Philis Fendt, MD SUMMARY OF HEMATOLOGIC HISTORY: Please see my original consult note dated 11/27/2015 for further details. He was admitted through the emergency room after presentation with severe epigastric pain. The pain was of sudden onset and was rated a 10 out of 10 at the time of presentation. CT scan of the abdomen dated 11/26/15 showed splenic infarct. The patient had no known risk factors for blood clots. He does not smoke now and does not use testosterone replacement therapy. There is positive family history in his sister which was thought to be provoked by surgeries. He denies any recent change in bowel habits. Denies nausea. In the ER, he was noted to have high alcohol level with testing positive for cocaine He denies recent dehydration No recent immobilization or surgery Denies recent chest pain or shortness of breath No recent leg swelling  The patient was admitted to the hospital from 11/26/2015 to 11/28/2015 and was anticoagulated with Xarelto  INTERVAL HISTORY: Bradley Ray 60 y.o. male returns for  Further follow-up. He denies further abdominal pain. The patient denies any recent signs or symptoms of bleeding such as spontaneous epistaxis, hematuria or hematochezia.  he denies recurrence of substance abuse since recent hospitalization  I have reviewed the past medical history, past surgical history, social history and family history with the patient and they are unchanged from previous note.  ALLERGIES:  is allergic to morphine and related.  MEDICATIONS:  Current Outpatient Prescriptions  Medication Sig Dispense Refill  . amLODipine (NORVASC) 10 MG tablet Take 1 tablet (10 mg total) by mouth daily. 30 tablet 1  . aspirin 81 MG tablet Take 81 mg by mouth daily.    Marland Kitchen atorvastatin (LIPITOR) 40 MG tablet Take 1 tablet (40 mg total) by mouth daily. 30 tablet 1  . hydrochlorothiazide (MICROZIDE) 12.5 MG capsule Take  12.5 mg by mouth daily.    . Rivaroxaban (XARELTO STARTER PACK) 15 & 20 MG TBPK Take as directed on package: Start with one 15mg  tablet by mouth twice a day with food. On Day 22, switch to one 20mg  tablet once a day with food. 51 each 0   No current facility-administered medications for this visit.     REVIEW OF SYSTEMS:   Constitutional: Denies fevers, chills or night sweats Eyes: Denies blurriness of vision Ears, nose, mouth, throat, and face: Denies mucositis or sore throat Respiratory: Denies cough, dyspnea or wheezes Cardiovascular: Denies palpitation, chest discomfort or lower extremity swelling Gastrointestinal:  Denies nausea, heartburn or change in bowel habits Skin: Denies abnormal skin rashes Lymphatics: Denies new lymphadenopathy or easy bruising Neurological:Denies numbness, tingling or new weaknesses Behavioral/Psych: Mood is stable, no new changes  All other systems were reviewed with the patient and are negative.  PHYSICAL EXAMINATION: ECOG PERFORMANCE STATUS: 0 - Asymptomatic  Filed Vitals:   12/05/15 1050  BP: 123/78  Pulse: 74  Temp: 98.3 F (36.8 C)  Resp: 18   Filed Weights   12/05/15 1050  Weight: 164 lb 4.8 oz (74.526 kg)    GENERAL:alert, no distress and comfortable SKIN: skin color, texture, turgor are normal, no rashes or significant lesions EYES: normal, Conjunctiva are pink and non-injected, sclera clear Musculoskeletal:no cyanosis of digits and no clubbing  NEURO: alert & oriented x 3 with fluent speech, no focal motor/sensory deficits  LABORATORY DATA:  I have reviewed the data as listed No results found for this or any previous visit (from the past 48 hour(s)).  Lab Results  Component Value Date   WBC 4.3 11/27/2015   HGB 13.5 11/27/2015   HCT 41.1 11/27/2015   MCV 66.6* 11/27/2015   PLT 171 11/27/2015    RADIOGRAPHIC STUDIES: I have personally reviewed the radiological images as listed and agreed with the findings in the  report.  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 Multiple investigations were performed. Ultimately, I felt that the most likely cause of the splenic infarct was related to cocaine abuse.  Additional screening past for myeloproliferative disorder were negative  He will need minimum 3 months of anticoagulation therapy. I recommend he continue Xarelto until end of May 2017. Afterwards, I recommend he switch to 162 mg of aspirin. I will bring him back at the end of June to repeat blood work and D-dimer test If d-dimer is elevated, he might need extended duration of anticoagulation therapy  Thalassemia trait, beta  The patient is noted to have microcytosis and subsequent workup revealed that he  Has thalassemia trait. I explained to the patient and his daughter the genetic implication to multiple family members    Substance abuse in remission  He had history of substance abuse with alcoholism and recent illicit drug use with cocaine I spent some time explaining to the patient the importance of staying abstinent especially in view that the cocaine could be the contributing factor to the splenic infarct.  the patient reassured me that he will not used illicit drugs anymore and will refrain from drinking excessively   All questions were answered. The patient knows to call the clinic with any problems, questions or concerns. No barriers to learning was detected.  I spent 20 minutes counseling the patient face to face. The total time spent in the appointment was 25 minutes and more than 50% was on counseling.     [facility], Claude Waldman, MD 2/23/20173:01 PM

## 2016-04-01 ENCOUNTER — Other Ambulatory Visit (HOSPITAL_BASED_OUTPATIENT_CLINIC_OR_DEPARTMENT_OTHER): Payer: Medicaid Other

## 2016-04-01 DIAGNOSIS — D735 Infarction of spleen: Secondary | ICD-10-CM

## 2016-04-01 DIAGNOSIS — R718 Other abnormality of red blood cells: Secondary | ICD-10-CM

## 2016-04-01 LAB — CBC WITH DIFFERENTIAL/PLATELET
BASO%: 0.9 % (ref 0.0–2.0)
Basophils Absolute: 0.1 10*3/uL (ref 0.0–0.1)
EOS%: 2.2 % (ref 0.0–7.0)
Eosinophils Absolute: 0.1 10*3/uL (ref 0.0–0.5)
HEMATOCRIT: 41.7 % (ref 38.4–49.9)
HEMOGLOBIN: 13.7 g/dL (ref 13.0–17.1)
LYMPH#: 2 10*3/uL (ref 0.9–3.3)
LYMPH%: 37.5 % (ref 14.0–49.0)
MCH: 20.8 pg — AB (ref 27.2–33.4)
MCHC: 32.9 g/dL (ref 32.0–36.0)
MCV: 63.3 fL — ABNORMAL LOW (ref 79.3–98.0)
MONO#: 0.6 10*3/uL (ref 0.1–0.9)
MONO%: 11.5 % (ref 0.0–14.0)
NEUT%: 47.9 % (ref 39.0–75.0)
NEUTROS ABS: 2.6 10*3/uL (ref 1.5–6.5)
NRBC: 0 % (ref 0–0)
Platelets: 272 10*3/uL (ref 140–400)
RBC: 6.59 10*6/uL — ABNORMAL HIGH (ref 4.20–5.82)
RDW: 16.5 % — AB (ref 11.0–14.6)
WBC: 5.4 10*3/uL (ref 4.0–10.3)

## 2016-04-02 LAB — D-DIMER, QUANTITATIVE: D-DIMER: <0.2 mg/L FEU (ref 0.00–0.49)

## 2016-04-06 ENCOUNTER — Ambulatory Visit (HOSPITAL_BASED_OUTPATIENT_CLINIC_OR_DEPARTMENT_OTHER): Payer: Medicaid Other | Admitting: Hematology and Oncology

## 2016-04-06 VITALS — BP 131/80 | HR 85 | Temp 98.1°F | Resp 17 | Ht 65.0 in | Wt 168.4 lb

## 2016-04-06 DIAGNOSIS — D735 Infarction of spleen: Secondary | ICD-10-CM

## 2016-04-06 DIAGNOSIS — D563 Thalassemia minor: Secondary | ICD-10-CM

## 2016-04-07 ENCOUNTER — Encounter: Payer: Self-pay | Admitting: Hematology and Oncology

## 2016-04-07 NOTE — Progress Notes (Signed)
[facility] OFFICE PROGRESS NOTE  Philis Fendt, MD SUMMARY OF HEMATOLOGIC HISTORY:  Please see my original consult note dated 11/27/2015 for further details. He was admitted through the emergency room after presentation with severe epigastric pain. The pain was of sudden onset and was rated a 10 out of 10 at the time of presentation. CT scan of the abdomen dated 11/26/15 showed splenic infarct. The patient had no known risk factors for blood clots. He does not smoke now and does not use testosterone replacement therapy. There is positive family history in his sister which was thought to be provoked by surgeries. He denies any recent change in bowel habits. Denies nausea. In the ER, he was noted to have high alcohol level with testing positive for cocaine He denies recent dehydration No recent immobilization or surgery Denies recent chest pain or shortness of breath No recent leg swelling  The patient was admitted to the hospital from 11/26/2015 to 11/28/2015 and was anticoagulated with Xarelto until end of May 2017. After that, he remained on aspirin therapy  INTERVAL HISTORY: Bradley Ray 60 y.o. male returns for further follow-up. He feels well. He is compliant taking aspirin therapy. The patient denies any recent signs or symptoms of bleeding such as spontaneous epistaxis, hematuria or hematochezia. He denies recent drug abuse  I have reviewed the past medical history, past surgical history, social history and family history with the patient and they are unchanged from previous note.  ALLERGIES:  is allergic to morphine and related.  MEDICATIONS:  Current Outpatient Prescriptions  Medication Sig Dispense Refill  . amLODipine (NORVASC) 10 MG tablet Take 1 tablet (10 mg total) by mouth daily. 30 tablet 1  . aspirin 81 MG tablet Take 81 mg by mouth daily.    Marland Kitchen atorvastatin (LIPITOR) 40 MG tablet Take 1 tablet (40 mg total) by mouth daily. 30 tablet 1  .  hydrochlorothiazide (MICROZIDE) 12.5 MG capsule Take 12.5 mg by mouth daily.     No current facility-administered medications for this visit.     REVIEW OF SYSTEMS:   Constitutional: Denies fevers, chills or night sweats Eyes: Denies blurriness of vision Ears, nose, mouth, throat, and face: Denies mucositis or sore throat Respiratory: Denies cough, dyspnea or wheezes Cardiovascular: Denies palpitation, chest discomfort or lower extremity swelling Gastrointestinal:  Denies nausea, heartburn or change in bowel habits Skin: Denies abnormal skin rashes Lymphatics: Denies new lymphadenopathy or easy bruising Neurological:Denies numbness, tingling or new weaknesses Behavioral/Psych: Mood is stable, no new changes  All other systems were reviewed with the patient and are negative.  PHYSICAL EXAMINATION: ECOG PERFORMANCE STATUS: 0 - Asymptomatic  Filed Vitals:   04/06/16 1041  BP: 131/80  Pulse: 85  Temp: 98.1 F (36.7 C)  Resp: 17   Filed Weights   04/06/16 1041  Weight: 168 lb 6.4 oz (76.386 kg)    GENERAL:alert, no distress and comfortable SKIN: skin color, texture, turgor are normal, no rashes or significant lesions EYES: normal, Conjunctiva are pink and non-injected, sclera clear Musculoskeletal:no cyanosis of digits and no clubbing  NEURO: alert & oriented x 3 with fluent speech, no focal motor/sensory deficits  LABORATORY DATA:  I have reviewed the data as listed     Component Value Date/Time   NA 138 11/27/2015 0730   K 4.5 11/27/2015 0730   CL 102 11/27/2015 0730   CO2 23 11/27/2015 0730   GLUCOSE 74 11/27/2015 0730   BUN 8 11/27/2015 0730   CREATININE 0.69  11/27/2015 0730   CALCIUM 9.2 11/27/2015 0730   PROT 7.2 11/26/2015 1252   ALBUMIN 3.7 11/26/2015 1252   AST 31 11/26/2015 1252   ALT 27 11/26/2015 1252   ALKPHOS 54 11/26/2015 1252   BILITOT 0.7 11/26/2015 1252   GFRNONAA >60 11/27/2015 0730   GFRAA >60 11/27/2015 0730    No results found for: SPEP,  UPEP  Lab Results  Component Value Date   WBC 5.4 04/01/2016   NEUTROABS 2.6 04/01/2016   HGB 13.7 04/01/2016   HCT 41.7 04/01/2016   MCV 63.3* 04/01/2016   PLT 272 04/01/2016      Chemistry      Component Value Date/Time   NA 138 11/27/2015 0730   K 4.5 11/27/2015 0730   CL 102 11/27/2015 0730   CO2 23 11/27/2015 0730   BUN 8 11/27/2015 0730   CREATININE 0.69 11/27/2015 0730      Component Value Date/Time   CALCIUM 9.2 11/27/2015 0730   ALKPHOS 54 11/26/2015 1252   AST 31 11/26/2015 1252   ALT 27 11/26/2015 1252   BILITOT 0.7 11/26/2015 1252     ASSESSMENT & PLAN:  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 Multiple investigations were performed. Ultimately, I felt that the most likely cause of the splenic infarct was related to cocaine abuse.  Additional screening past for myeloproliferative disorder were negative  He will need minimum 3 months of anticoagulation therapy. He had completed his treatment with Xarelto at the end of May 2017. Afterwards, I recommend he switch to 162 mg of aspirin indefinitely  Recent repeat blood work and D-dimer test were negative  He does not need return follow-up.  Thalassemia trait, beta  The patient is noted to have microcytosis and subsequent workup revealed that he has thalassemia trait. I explained to the patient and his daughter the genetic implication to multiple family members       All questions were answered. The patient knows to call the clinic with any problems, questions or concerns. No barriers to learning was detected.  I spent 15 minutes counseling the patient face to face. The total time spent in the appointment was 20 minutes and more than 50% was on counseling.     [facility], Narely Nobles, MD 6/27/20178:26 AM

## 2016-04-07 NOTE — Assessment & Plan Note (Signed)
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 Multiple investigations were performed. Ultimately, I felt that the most likely cause of the splenic infarct was related to cocaine abuse.  Additional screening past for myeloproliferative disorder were negative  He will need minimum 3 months of anticoagulation therapy. He had completed his treatment with Xarelto at the end of May 2017. Afterwards, I recommend he switch to 162 mg of aspirin indefinitely  Recent repeat blood work and D-dimer test were negative  He does not need return follow-up.

## 2016-04-07 NOTE — Assessment & Plan Note (Signed)
The patient is noted to have microcytosis and subsequent workup revealed that he has thalassemia trait. I explained to the patient and his daughter the genetic implication to multiple family members

## 2016-05-28 ENCOUNTER — Emergency Department (HOSPITAL_COMMUNITY)
Admission: EM | Admit: 2016-05-28 | Discharge: 2016-05-28 | Disposition: A | Discharge status: Home / Self Care | Payer: Medicaid Other | Attending: Emergency Medicine | Admitting: Emergency Medicine

## 2016-05-28 ENCOUNTER — Encounter (HOSPITAL_COMMUNITY): Payer: Self-pay | Admitting: Emergency Medicine

## 2016-05-28 ENCOUNTER — Emergency Department (HOSPITAL_COMMUNITY): Payer: Medicaid Other

## 2016-05-28 DIAGNOSIS — W293XXA Contact with powered garden and outdoor hand tools and machinery, initial encounter: Secondary | ICD-10-CM | POA: Insufficient documentation

## 2016-05-28 DIAGNOSIS — Z7982 Long term (current) use of aspirin: Secondary | ICD-10-CM | POA: Insufficient documentation

## 2016-05-28 DIAGNOSIS — Y939 Activity, unspecified: Secondary | ICD-10-CM | POA: Diagnosis not present

## 2016-05-28 DIAGNOSIS — Z96659 Presence of unspecified artificial knee joint: Secondary | ICD-10-CM | POA: Diagnosis not present

## 2016-05-28 DIAGNOSIS — I10 Essential (primary) hypertension: Secondary | ICD-10-CM | POA: Insufficient documentation

## 2016-05-28 DIAGNOSIS — S61210A Laceration without foreign body of right index finger without damage to nail, initial encounter: Secondary | ICD-10-CM | POA: Diagnosis not present

## 2016-05-28 DIAGNOSIS — Y999 Unspecified external cause status: Secondary | ICD-10-CM | POA: Insufficient documentation

## 2016-05-28 DIAGNOSIS — Y929 Unspecified place or not applicable: Secondary | ICD-10-CM | POA: Insufficient documentation

## 2016-05-28 DIAGNOSIS — S61219A Laceration without foreign body of unspecified finger without damage to nail, initial encounter: Secondary | ICD-10-CM

## 2016-05-28 MED ORDER — CEPHALEXIN 500 MG PO CAPS
500.0000 mg | ORAL_CAPSULE | Freq: Two times a day (BID) | ORAL | 0 refills | Status: DC
Start: 2016-05-28 — End: 2016-08-10

## 2016-05-28 MED ORDER — TETANUS-DIPHTH-ACELL PERTUSSIS 5-2.5-18.5 LF-MCG/0.5 IM SUSP
0.5000 mL | Freq: Once | INTRAMUSCULAR | Status: AC
  Administered 2016-05-28: 0.5 mL via INTRAMUSCULAR
  Filled 2016-05-28: qty 0.5

## 2016-05-28 MED ORDER — LIDOCAINE HCL (PF) 1 % IJ SOLN
5.0000 mL | Freq: Once | INTRAMUSCULAR | Status: AC
  Administered 2016-05-28: 5 mL
  Filled 2016-05-28: qty 5

## 2016-05-28 NOTE — ED Notes (Signed)
Declined W/C at D/C and was escorted to lobby by RN. 

## 2016-05-28 NOTE — ED Provider Notes (Signed)
[facility] DEPT Provider Note   CSN: [id number] Arrival date & time: 05/28/16  1140   By signing my name below, I, Bradley Ray, attest that this documentation has been prepared under the direction and in the presence of non-physician practitioner, Shary Decamp, PA-C. Electronically Signed: Evelene Ray, Scribe. 05/28/2016. 11:54 AM.   History   Chief Complaint Chief Complaint  Patient presents with  . Hand Injury    The history is provided by the patient. No language interpreter was used.     HPI Comments:  Bradley Ray is a 60 y.o. male who presents to the Emergency Department complaining of laceration to the right hand which occurred ~ 20 minutes ago. Pt was using a battery powered hedge trimmer when he injured the finger; states he heard a snap. throbbing, 3/10, pain to the right index fingerTetanus status is unknown. Pt has no other complaints or symptoms at this time. No alleviating factors noted.   Past Medical History:  Diagnosis Date  . Hypertension     Patient Active Problem List   Diagnosis Date Noted  . 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 Essential hypertension 11/26/2015  . 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 High anion gap metabolic acidosis [id number]  . Substance abuse in remission 11/26/2015    Past Surgical History:  Procedure Laterality Date  . TOTAL KNEE ARTHROPLASTY         Home Medications    Prior to Admission medications   Medication Sig Start Date End Date Taking? Authorizing Provider  amLODipine (NORVASC) 10 MG tablet Take 1 tablet (10 mg total) by mouth daily. 11/29/15   Kelvin Cellar, MD  aspirin 81 MG tablet Take 81 mg by mouth daily.    Historical Provider, MD  atorvastatin (LIPITOR) 40 MG tablet Take 1 tablet (40 mg total) by mouth daily. 11/28/15   Kelvin Cellar, MD  hydrochlorothiazide (MICROZIDE) 12.5 MG capsule Take 12.5 mg by mouth daily.    Historical Provider, MD    Family History Family History  Problem Relation Age of  Onset  . Arrhythmia Mother     Has pacer  . Throat cancer Father   . Pancreatic cancer Sister   . Clotting disorder Sister     related knee surgeries  . Sickle cell trait Son     Social History Social History  Substance Use Topics  . Smoking status: Never Smoker  . Smokeless tobacco: Never Used  . Alcohol use Yes     Allergies   Morphine and related   Review of Systems Review of Systems  Constitutional: Negative for fever.  Skin: Positive for wound.  Neurological: Negative for weakness.   Physical Exam Updated Vital Signs BP 135/88 (BP Location: Right Arm)   Pulse 103   Temp 98 F (36.7 C) (Oral)   SpO2 99%   Physical Exam  Constitutional: He is oriented to person, place, and time. He appears well-developed and well-nourished. No distress.  HENT:  Head: Normocephalic and atraumatic.  Eyes: Conjunctivae are normal.  Cardiovascular: Normal rate.   Pulmonary/Chest: Effort normal.  Abdominal: He exhibits no distension.  Musculoskeletal:  3rd Digit: Pt able to extended PIP joint, but unable to extend DIP with isolation.  Neurological: He is alert and oriented to person, place, and time.  Skin: Skin is warm and dry. Capillary refill takes less than 2 seconds.  1 cm linear laceration to right second digit in between the DIP and PIP; Bottom of wound visualized  Sensation and motor intact Bleeding controlled  Psychiatric: He has a normal mood and affect.  Nursing note and vitals reviewed.  ED Treatments / Results  DIAGNOSTIC STUDIES:  Oxygen Saturation is 99% on RA, normal by my interpretation.    COORDINATION OF CARE:  11:54 AM Discussed treatment plan with pt at bedside and pt agreed to plan.  Labs (all labs ordered are listed, but only abnormal results are displayed) Labs Reviewed - No data to display  EKG  EKG Interpretation None       Radiology Dg Finger Middle Right  Result Date: 05/28/2016 CLINICAL DATA:  Finger cut with hedge clippers  EXAM: RIGHT THIRD FINGER 2+V COMPARISON:  None. FINDINGS: Frontal, oblique, and lateral views were obtained. There it is an incomplete fracture along the dorsal aspect of the cortex of the midportion of the third middle phalanx with a small a avulsed fragment. Complete fracture not seen. There is soft tissue swelling in this area. No other fracture. No dislocation. The joint spaces appear normal. No radiopaque foreign body. IMPRESSION: Incomplete fracture along the cortex of the dorsal aspect of the third middle phalanx with a small avulsed fragment. No other fracture. No dislocation. Soft tissue swelling. Electronically Signed   By: Lowella Grip III M.D.   On: 05/28/2016 12:29    Procedures .Marland KitchenLaceration Repair Date/Time: 05/28/2016 1:43 PM Performed by: Shary Decamp Authorized by: Shary Decamp   Consent:    Consent obtained:  Verbal Anesthesia (see MAR for exact dosages):    Anesthesia method:  Nerve block   Block needle gauge:  25 G   Block anesthetic:  Lidocaine 1% w/o epi   Block injection procedure:  Anatomic landmarks identified Laceration details:    Location:  Finger   Finger location:  R long finger   Length (cm):  1 Repair type:    Repair type:  Simple Pre-procedure details:    Preparation:  Patient was prepped and draped in usual sterile fashion Exploration:    Hemostasis achieved with:  Direct pressure   Wound exploration: wound explored through full range of motion   Treatment:    Area cleansed with:  Betadine   Amount of cleaning:  Extensive   Irrigation solution:  Sterile saline   Irrigation method:  Pressure wash Skin repair:    Repair method:  Sutures   Suture size:  6-0   Wound skin closure material used: Vicryl.   Suture technique:  Simple interrupted   Number of sutures:  3 Approximation:    Vermilion border: well-aligned   Post-procedure details:    Dressing:  Antibiotic ointment, sterile dressing and bulky dressing   Patient tolerance of procedure:   Tolerated well, no immediate complications   (including critical care time)  Medications Ordered in ED Medications - No data to display  12:56 PM Discussed case with Dr. Grandville Silos (Hand Surgeon)   Initial Impression / Assessment and Plan / ED Course  I have reviewed the triage vital signs and the nursing notes.  Pertinent labs & imaging results that were available during my care of the patient were reviewed by me and considered in my medical decision making (see chart for details).  Clinical Course    Final Clinical Impressions(s) / ED Diagnoses  I have reviewed and evaluated the relevant imaging studies.  I have reviewed the relevant previous healthcare records.I obtained HPI from historian.  ED Course:  Assessment: Pt is a 60yM who presents with right 3rd digit laceration between DIP and PIP. On exam, pt in NAD. Nontoxic/nonseptic appearing. VSS.  Afebrile. Pt unable to extend DIP, able to extend PIP. Laceration 1cm in length. Bottom of wound visualized. Imaging shows incomplete fracture along cortex of dorsal aspect of third middle phalanx. Suspect extensor tendon injury with DIP. Consult to Hand (Dr. Grandville Silos) who has seen pt. Repaired in ED with Three 6-0 Vicryl after copious irrigation. Placed in splint. Tetanus given. Given Rx ABX. Plan is to DC home with follow up to hand Surgery. At time of discharge, Patient is in no acute distress. Vital Signs are stable. Patient is able to ambulate. Patient able to tolerate PO.   Disposition/Plan:  DC Home Additional Verbal discharge instructions given and discussed with patient.  Pt Instructed to f/u with Hand in the next week for evaluation and treatment of symptoms. Return precautions given Pt acknowledges and agrees with plan  Supervising Physician Leo Grosser, MD   Final diagnoses:  Finger laceration, initial encounter    New Prescriptions New Prescriptions   No medications on file   I personally performed the services  described in this documentation, which was scribed in my presence. The recorded information has been reviewed and is accurate.     Shary Decamp, PA-C 05/28/16 1345    Leo Grosser, MD 05/29/16 [phone]

## 2016-05-28 NOTE — Discharge Instructions (Addendum)
Keep bandage on, keep it clean and dry until you return to the office next week     Please read and follow all provided instructions.  Your diagnoses today include:  1. Finger laceration, initial encounter     Tests performed today include: Vital signs. See below for your results today.   Medications prescribed:  Take as prescribed   Home care instructions:  Follow any educational materials contained in this packet.  Follow-up instructions: Please follow-up with Orthopedics for further evaluation of symptoms and treatment   Return instructions:  Please return to the Emergency Department if you do not get better, if you get worse, or new symptoms OR  - Fever (temperature greater than 101.66F)  - Bleeding that does not stop with holding pressure to the area    -Severe pain (please note that you may be more sore the day after your accident)  - Chest Pain  - Difficulty breathing  - Severe nausea or vomiting  - Inability to tolerate food and liquids  - Passing out  - Skin becoming red around your wounds  - Change in mental status (confusion or lethargy)  - New numbness or weakness    Please return if you have any other emergent concerns.  Additional Information:  Your vital signs today were: BP 135/88 (BP Location: Right Arm)    Pulse 103    Temp 98 F (36.7 C) (Oral)    SpO2 99%  If your blood pressure (BP) was elevated above 135/85 this visit, please have this repeated by your doctor within one month. ---------------

## 2016-05-28 NOTE — Consult Note (Signed)
ORTHOPAEDIC CONSULTATION HISTORY & PHYSICAL REQUESTING PHYSICIAN: Leo Grosser, MD  Chief Complaint: R LF laceration  HPI: Bradley Ray is a 60 y.o. male who lacerated his R LF on the dorsum today with a hedge trimmer.  Has been noted to have mild DIP ext lag  Past Medical History:  Diagnosis Date  . Hypertension    Past Surgical History:  Procedure Laterality Date  . TOTAL KNEE ARTHROPLASTY     Social History   Social History  . Marital status: Single    Spouse name: N/A  . Number of children: N/A  . Years of education: N/A   Social History Main Topics  . Smoking status: Never Smoker  . Smokeless tobacco: Never Used  . Alcohol use Yes  . Drug use:   . Sexual activity: Not Asked   Other Topics Concern  . None   Social History Narrative  . None   Family History  Problem Relation Age of Onset  . Arrhythmia Mother     Has pacer  . Throat cancer Father   . Pancreatic cancer Sister   . Clotting disorder Sister     related knee surgeries  . Sickle cell trait Son    Allergies  Allergen Reactions  . Morphine And Related Rash   Prior to Admission medications   Medication Sig Start Date End Date Taking? Authorizing Provider  amLODipine (NORVASC) 10 MG tablet Take 1 tablet (10 mg total) by mouth daily. 11/29/15   Kelvin Cellar, MD  aspirin 81 MG tablet Take 81 mg by mouth daily.    Historical Provider, MD  atorvastatin (LIPITOR) 40 MG tablet Take 1 tablet (40 mg total) by mouth daily. 11/28/15   Kelvin Cellar, MD  hydrochlorothiazide (MICROZIDE) 12.5 MG capsule Take 12.5 mg by mouth daily.    Historical Provider, MD   Dg Finger Middle Right  Result Date: 05/28/2016 CLINICAL DATA:  Finger cut with hedge clippers EXAM: RIGHT THIRD FINGER 2+V COMPARISON:  None. FINDINGS: Frontal, oblique, and lateral views were obtained. There it is an incomplete fracture along the dorsal aspect of the cortex of the midportion of the third middle phalanx with a small a avulsed  fragment. Complete fracture not seen. There is soft tissue swelling in this area. No other fracture. No dislocation. The joint spaces appear normal. No radiopaque foreign body. IMPRESSION: Incomplete fracture along the cortex of the dorsal aspect of the third middle phalanx with a small avulsed fragment. No other fracture. No dislocation. Soft tissue swelling. Electronically Signed   By: Lowella Grip III M.D.   On: 05/28/2016 12:29    Positive ROS: All other systems have been reviewed and were otherwise negative with the exception of those mentioned in the HPI and as above.  Physical Exam: Vitals: Refer to EMR. Constitutional:  WD, WN, NAD HEENT:  NCAT, EOMI Neuro/Psych:  Alert & oriented to person, place, and time; appropriate mood & affect Lymphatic: No generalized extremity edema or lymphadenopathy Extremities / MSK:  The extremities are normal with respect to appearance, ranges of motion, joint stability, muscle strength/tone, sensation, & perfusion except as otherwise noted:  25 degree ext lag at DIPJ of R LF, with xverse dorsal lac 1cm in length in zone 2  Assessment: Probably R LF extensor tendon lac in Zone 2  Plan: EDP to provide 1st aid measures (irrigation, closure, splinting, antibiotics on D/C) Office will call patient to arrange f/u for Monday for wound check, and if still with significant DIP ext lag,  proceed with surgery, possibly Tuesday afternoon.  Rayvon Char [place] Silos, [address] [place], [place]  [address] Office: [phone] Mobile: [phone]  05/28/2016, 1:24 PM

## 2016-05-28 NOTE — ED Triage Notes (Signed)
Pt reports 3rd digit right hand cut by hedge trimmer. Pt holding pressure to wound. Bleeding controlled. CR <2sec.

## 2016-05-28 NOTE — ED Notes (Signed)
Suture cart set up at bedside  

## 2016-05-29 ENCOUNTER — Other Ambulatory Visit: Payer: Self-pay | Admitting: Orthopedic Surgery

## 2016-06-01 ENCOUNTER — Encounter (HOSPITAL_BASED_OUTPATIENT_CLINIC_OR_DEPARTMENT_OTHER): Payer: Self-pay | Admitting: *Deleted

## 2016-06-01 NOTE — H&P (Signed)
Bradley Ray is an 60 y.o. male.   CC / Reason for Visit: Right long finger laceration HPI: This patient returns to clinic today after being seen in the emergency department for a transverse right long finger laceration just distal to his PIP joint.  He has a DIP droop, and a laceration with 3 dissolvable sutures.  He reports that he has approximately 2 tablets left of antibiotics and that he has not been taking any pain medication.  Past Medical History:  Diagnosis Date  . Hypertension     Past Surgical History:  Procedure Laterality Date  . TOTAL KNEE ARTHROPLASTY      Family History  Problem Relation Age of Onset  . Arrhythmia Mother     Has pacer  . Throat cancer Father   . Pancreatic cancer Sister   . Clotting disorder Sister     related knee surgeries  . Sickle cell trait Son    Social History:  reports that he quit smoking about 10 years ago. His smoking use included Cigarettes. He has a 10.00 pack-year smoking history. He has never used smokeless tobacco. He reports that he drinks about 1.8 oz of alcohol per week . He reports that he uses drugs, including "Crack" cocaine, about 1 time per week.  Allergies:  Allergies  Allergen Reactions  . Morphine And Related Rash    No prescriptions prior to admission.    No results found for this or any previous visit (from the past 48 hour(s)). No results found.  Review of Systems  All other systems reviewed and are negative.   Height 5' 5.5" (1.664 m), weight 76.2 kg (168 lb). Physical Exam  Constitutional:  WD, WN, NAD HEENT:  NCAT, EOMI Neuro/Psych:  Alert & oriented to person, place, and time; appropriate mood & affect Lymphatic: No generalized UE edema or lymphadenopathy Extremities / MSK:  Both UE are normal with respect to appearance, ranges of motion, joint stability, muscle strength/tone, sensation, & perfusion except as otherwise noted:  The right long finger has a transverse laceration just distal to the PIP.   There is a 30 droop and the patient is unable to actively extend his DIP joint.  There is light touch sensibility on both the radial and ulnar aspects of the finger distal to the laceration,  however there is altered sensibility on the dorsum distal to the laceration.  All other digits have full range of motion.  Labs / Xrays:  No radiographic studies obtained today.  Assessment:  Right long finger extensor tendon laceration  Plan: Findings are discussed with the patient.  He will proceed with surgical repair of the right long finger extensor tendon. The details of the operative procedure were discussed with the patient.  Questions were invited and answered.  In addition to the goal of the procedure, the risks of the procedure to include but not limited to bleeding; infection; damage to the nerves or blood vessels that could result in bleeding, numbness, weakness, chronic pain, and the need for additional procedures; stiffness; the need for revision surgery; and anesthetic risks were reviewed.  No specific outcome was guaranteed or implied.  Informed consent was obtained.   Jalyah Weinheimer A., MD 06/01/2016, 6:07 PM

## 2016-06-02 ENCOUNTER — Ambulatory Visit (HOSPITAL_BASED_OUTPATIENT_CLINIC_OR_DEPARTMENT_OTHER)
Admission: RE | Admit: 2016-06-02 | Discharge: 2016-06-02 | Disposition: A | Discharge status: Home / Self Care | Payer: Medicaid Other | Source: Ambulatory Visit | Attending: Orthopedic Surgery | Admitting: Orthopedic Surgery

## 2016-06-02 ENCOUNTER — Encounter (HOSPITAL_BASED_OUTPATIENT_CLINIC_OR_DEPARTMENT_OTHER): Payer: Self-pay

## 2016-06-02 ENCOUNTER — Encounter (HOSPITAL_BASED_OUTPATIENT_CLINIC_OR_DEPARTMENT_OTHER)
Admission: RE | Disposition: A | Discharge status: Home / Self Care | Payer: Self-pay | Source: Ambulatory Visit | Attending: Orthopedic Surgery

## 2016-06-02 ENCOUNTER — Ambulatory Visit (HOSPITAL_BASED_OUTPATIENT_CLINIC_OR_DEPARTMENT_OTHER): Payer: Medicaid Other | Admitting: Anesthesiology

## 2016-06-02 DIAGNOSIS — E669 Obesity, unspecified: Secondary | ICD-10-CM | POA: Insufficient documentation

## 2016-06-02 DIAGNOSIS — I10 Essential (primary) hypertension: Secondary | ICD-10-CM | POA: Diagnosis not present

## 2016-06-02 DIAGNOSIS — S62622B Displaced fracture of medial phalanx of right middle finger, initial encounter for open fracture: Secondary | ICD-10-CM | POA: Insufficient documentation

## 2016-06-02 DIAGNOSIS — Z87891 Personal history of nicotine dependence: Secondary | ICD-10-CM | POA: Diagnosis not present

## 2016-06-02 DIAGNOSIS — Z7982 Long term (current) use of aspirin: Secondary | ICD-10-CM | POA: Insufficient documentation

## 2016-06-02 DIAGNOSIS — S66322A Laceration of extensor muscle, fascia and tendon of right middle finger at wrist and hand level, initial encounter: Secondary | ICD-10-CM | POA: Diagnosis present

## 2016-06-02 DIAGNOSIS — W278XXA Contact with other nonpowered hand tool, initial encounter: Secondary | ICD-10-CM | POA: Insufficient documentation

## 2016-06-02 DIAGNOSIS — Z96659 Presence of unspecified artificial knee joint: Secondary | ICD-10-CM | POA: Insufficient documentation

## 2016-06-02 DIAGNOSIS — Z79899 Other long term (current) drug therapy: Secondary | ICD-10-CM | POA: Insufficient documentation

## 2016-06-02 DIAGNOSIS — E785 Hyperlipidemia, unspecified: Secondary | ICD-10-CM | POA: Diagnosis not present

## 2016-06-02 DIAGNOSIS — Z6828 Body mass index (BMI) 28.0-28.9, adult: Secondary | ICD-10-CM | POA: Insufficient documentation

## 2016-06-02 HISTORY — PX: REPAIR EXTENSOR TENDON: SHX5382

## 2016-06-02 SURGERY — REPAIR, TENDON, EXTENSOR
Anesthesia: Monitor Anesthesia Care | Site: Finger | Laterality: Right

## 2016-06-02 MED ORDER — GLYCOPYRROLATE 0.2 MG/ML IJ SOLN: 0.2000 mg | Freq: Once | INTRAMUSCULAR | Status: DC | PRN

## 2016-06-02 MED ORDER — SCOPOLAMINE 1 MG/3DAYS TD PT72: 1.0000 | MEDICATED_PATCH | Freq: Once | TRANSDERMAL | Status: DC | PRN

## 2016-06-02 MED ORDER — PROMETHAZINE HCL 25 MG/ML IJ SOLN: 6.2500 mg | INTRAMUSCULAR | Status: DC | PRN

## 2016-06-02 MED ORDER — CEFAZOLIN SODIUM-DEXTROSE 2-4 GM/100ML-% IV SOLN
INTRAVENOUS | Status: AC
Start: 2016-06-02 — End: 2016-06-02
  Filled 2016-06-02: qty 100

## 2016-06-02 MED ORDER — LIDOCAINE HCL (PF) 1 % IJ SOLN
INTRAMUSCULAR | Status: DC | PRN
  Administered 2016-06-02: 5 mL

## 2016-06-02 MED ORDER — LACTATED RINGERS IV SOLN
INTRAVENOUS | Status: DC
  Administered 2016-06-02: 11:00:00 via INTRAVENOUS

## 2016-06-02 MED ORDER — LIDOCAINE HCL (PF) 1 % IJ SOLN
INTRAMUSCULAR | Status: AC
  Filled 2016-06-02: qty 30

## 2016-06-02 MED ORDER — LIDOCAINE-EPINEPHRINE (PF) 1 %-1:200000 IJ SOLN
INTRAMUSCULAR | Status: AC
  Filled 2016-06-02: qty 30

## 2016-06-02 MED ORDER — MIDAZOLAM HCL 2 MG/2ML IJ SOLN
1.0000 mg | INTRAMUSCULAR | Status: DC | PRN
  Administered 2016-06-02: 2 mg via INTRAVENOUS

## 2016-06-02 MED ORDER — PROPOFOL 10 MG/ML IV BOLUS
INTRAVENOUS | Status: DC | PRN
  Administered 2016-06-02 (×5): 20 mg via INTRAVENOUS

## 2016-06-02 MED ORDER — CEFAZOLIN SODIUM-DEXTROSE 2-4 GM/100ML-% IV SOLN
2.0000 g | INTRAVENOUS | Status: AC
  Administered 2016-06-02: 2 g via INTRAVENOUS

## 2016-06-02 MED ORDER — FENTANYL CITRATE (PF) 100 MCG/2ML IJ SOLN
50.0000 ug | INTRAMUSCULAR | Status: DC | PRN
  Administered 2016-06-02: 100 ug via INTRAVENOUS

## 2016-06-02 MED ORDER — BUPIVACAINE-EPINEPHRINE (PF) 0.5% -1:200000 IJ SOLN
INTRAMUSCULAR | Status: AC
  Filled 2016-06-02: qty 30

## 2016-06-02 MED ORDER — MIDAZOLAM HCL 2 MG/2ML IJ SOLN
INTRAMUSCULAR | Status: AC
  Filled 2016-06-02: qty 2

## 2016-06-02 MED ORDER — BUPIVACAINE-EPINEPHRINE 0.5% -1:200000 IJ SOLN
INTRAMUSCULAR | Status: DC | PRN
  Administered 2016-06-02: 5 mL

## 2016-06-02 MED ORDER — BUPIVACAINE HCL (PF) 0.25 % IJ SOLN
INTRAMUSCULAR | Status: AC
  Filled 2016-06-02: qty 30

## 2016-06-02 MED ORDER — ONDANSETRON HCL 4 MG/2ML IJ SOLN
INTRAMUSCULAR | Status: DC | PRN
  Administered 2016-06-02: 4 mg via INTRAVENOUS

## 2016-06-02 MED ORDER — LIDOCAINE 2% (20 MG/ML) 5 ML SYRINGE
INTRAMUSCULAR | Status: DC | PRN
  Administered 2016-06-02: 30 mg via INTRAVENOUS

## 2016-06-02 MED ORDER — LIDOCAINE-EPINEPHRINE 1 %-1:100000 IJ SOLN
INTRAMUSCULAR | Status: AC
  Filled 2016-06-02: qty 1

## 2016-06-02 MED ORDER — FENTANYL CITRATE (PF) 100 MCG/2ML IJ SOLN: 25.0000 ug | INTRAMUSCULAR | Status: DC | PRN

## 2016-06-02 MED ORDER — LACTATED RINGERS IV SOLN
INTRAVENOUS | Status: DC
  Administered 2016-06-02 (×2): via INTRAVENOUS

## 2016-06-02 MED ORDER — FENTANYL CITRATE (PF) 100 MCG/2ML IJ SOLN
INTRAMUSCULAR | Status: AC
Start: 2016-06-02 — End: 2016-06-02
  Filled 2016-06-02: qty 2

## 2016-06-02 SURGICAL SUPPLY — 62 items
BANDAGE COBAN STERILE 2 (GAUZE/BANDAGES/DRESSINGS) ×2 IMPLANT
BLADE MINI RND TIP GREEN BEAV (BLADE) IMPLANT
BLADE SURG 15 STRL LF DISP TIS (BLADE) ×1 IMPLANT
BLADE SURG 15 STRL SS (BLADE) ×1
BNDG COHESIVE 4X5 TAN STRL (GAUZE/BANDAGES/DRESSINGS) ×2 IMPLANT
BNDG CONFORM 2 STRL LF (GAUZE/BANDAGES/DRESSINGS) ×2 IMPLANT
BNDG ESMARK 4X9 LF (GAUZE/BANDAGES/DRESSINGS) ×2 IMPLANT
BNDG GAUZE ELAST 4 BULKY (GAUZE/BANDAGES/DRESSINGS) ×2 IMPLANT
CHLORAPREP W/TINT 26ML (MISCELLANEOUS) ×2 IMPLANT
CORDS BIPOLAR (ELECTRODE) ×2 IMPLANT
COVER BACK TABLE 60X90IN (DRAPES) ×2 IMPLANT
COVER MAYO STAND STRL (DRAPES) ×2 IMPLANT
CUFF TOURNIQUET SINGLE 18IN (TOURNIQUET CUFF) IMPLANT
DECANTER SPIKE VIAL GLASS SM (MISCELLANEOUS) IMPLANT
DRAPE EXTREMITY T 121X128X90 (DRAPE) ×2 IMPLANT
DRAPE SURG 17X23 STRL (DRAPES) ×2 IMPLANT
DRSG EMULSION OIL 3X3 NADH (GAUZE/BANDAGES/DRESSINGS) ×2 IMPLANT
GAUZE SPONGE 4X4 12PLY STRL (GAUZE/BANDAGES/DRESSINGS) ×2 IMPLANT
GLOVE BIO SURGEON STRL SZ7.5 (GLOVE) ×2 IMPLANT
GLOVE BIOGEL PI IND STRL 7.0 (GLOVE) ×1 IMPLANT
GLOVE BIOGEL PI IND STRL 8 (GLOVE) ×1 IMPLANT
GLOVE BIOGEL PI INDICATOR 7.0 (GLOVE) ×1
GLOVE BIOGEL PI INDICATOR 8 (GLOVE) ×1
GLOVE ECLIPSE 6.5 STRL STRAW (GLOVE) ×2 IMPLANT
GOWN STRL REUS W/ TWL LRG LVL3 (GOWN DISPOSABLE) ×2 IMPLANT
GOWN STRL REUS W/TWL LRG LVL3 (GOWN DISPOSABLE) ×2
GOWN STRL REUS W/TWL XL LVL3 (GOWN DISPOSABLE) ×2 IMPLANT
K-WIRE .045X4 (WIRE) ×2 IMPLANT
LOOP VESSEL MAXI BLUE (MISCELLANEOUS) IMPLANT
LOOP VESSEL MINI RED (MISCELLANEOUS) IMPLANT
NEEDLE HYPO 25X1 1.5 SAFETY (NEEDLE) IMPLANT
NS IRRIG 1000ML POUR BTL (IV SOLUTION) ×2 IMPLANT
PACK BASIN DAY SURGERY FS (CUSTOM PROCEDURE TRAY) ×2 IMPLANT
PADDING CAST ABS 3INX4YD NS (CAST SUPPLIES)
PADDING CAST ABS 4INX4YD NS (CAST SUPPLIES) ×1
PADDING CAST ABS COTTON 3X4 (CAST SUPPLIES) IMPLANT
PADDING CAST ABS COTTON 4X4 ST (CAST SUPPLIES) ×1 IMPLANT
SLEEVE SCD COMPRESS KNEE MED (MISCELLANEOUS) IMPLANT
SLING ARM FOAM STRAP LRG (SOFTGOODS) IMPLANT
SPLINT PLASTER CAST XFAST 3X15 (CAST SUPPLIES) IMPLANT
SPLINT PLASTER CAST XFAST 4X15 (CAST SUPPLIES) IMPLANT
SPLINT PLASTER XTRA FAST SET 4 (CAST SUPPLIES)
SPLINT PLASTER XTRA FASTSET 3X (CAST SUPPLIES)
SPONGE GAUZE 4X4 12PLY STER LF (GAUZE/BANDAGES/DRESSINGS) ×4 IMPLANT
STOCKINETTE 6  STRL (DRAPES) ×1
STOCKINETTE 6 STRL (DRAPES) ×1 IMPLANT
SUT ETHIBOND 3-0 V-5 (SUTURE) IMPLANT
SUT FIBERWIRE 2-0 18 17.9 3/8 (SUTURE)
SUT MERSILENE 4 0 P 3 (SUTURE) IMPLANT
SUT PROLENE 6 0 P 1 18 (SUTURE) ×2 IMPLANT
SUT SILK 4 0 PS 2 (SUTURE) IMPLANT
SUT STEEL 4 (SUTURE) IMPLANT
SUT SUPRAMID 3-0 (SUTURE) IMPLANT
SUT SUPRAMID 4-0 (SUTURE) ×2 IMPLANT
SUT VICRYL RAPIDE 4-0 (SUTURE) ×2 IMPLANT
SUT VICRYL RAPIDE 4/0 PS 2 (SUTURE) ×2 IMPLANT
SUTURE FIBERWR 2-0 18 17.9 3/8 (SUTURE) IMPLANT
SYR BULB 3OZ (MISCELLANEOUS) ×2 IMPLANT
SYRINGE 10CC LL (SYRINGE) IMPLANT
TOWEL OR 17X24 6PK STRL BLUE (TOWEL DISPOSABLE) ×2 IMPLANT
TUBE CONNECTING 20X1/4 (TUBING) IMPLANT
UNDERPAD 30X30 (UNDERPADS AND DIAPERS) ×2 IMPLANT

## 2016-06-02 NOTE — Anesthesia Postprocedure Evaluation (Signed)
Anesthesia Post Note  Patient: Bradley Ray  Procedure(s) Performed: Procedure(s) (LRB): RIGHT LONG FINGER EXTENSOR TENDON REPAIR (Right)  Patient location during evaluation: PACU Anesthesia Type: MAC Level of consciousness: awake and alert Pain management: pain level controlled Vital Signs Assessment: post-procedure vital signs reviewed and stable Respiratory status: spontaneous breathing, nonlabored ventilation, respiratory function stable and patient connected to nasal cannula oxygen Cardiovascular status: stable and blood pressure returned to baseline Anesthetic complications: no    Last Vitals:  Vitals:   06/02/16 1345 06/02/16 1400  BP: 106/73 123/68  Pulse: 72 78  Resp: 16 20  Temp: 36.7 C     Last Pain:  Vitals:   06/02/16 1345  TempSrc:   PainSc: 0-No pain                 Effie Berkshire

## 2016-06-02 NOTE — Interval H&P Note (Signed)
History and Physical Interval Note:  06/02/2016 12:51 PM  Bradley Ray  has presented today for surgery, with the diagnosis of RIGHT LONG FINGER EXTENSOR TENDON INJURY [phone]  The various methods of treatment have been discussed with the patient and family. After consideration of risks, benefits and other options for treatment, the patient has consented to  Procedure(s): RIGHT LONG FINGER EXTENSOR TENDON REPAIR (Right) as a surgical intervention .  The patient's history has been reviewed, patient examined, no change in status, stable for surgery.  I have reviewed the patient's chart and labs.  Questions were answered to the patient's satisfaction.     Thomasena Vandenheuvel A.

## 2016-06-02 NOTE — H&P (View-Only) (Signed)
ORTHOPAEDIC CONSULTATION HISTORY & PHYSICAL REQUESTING PHYSICIAN: Leo Grosser, MD  Chief Complaint: R LF laceration  HPI: Bradley Ray is a 60 y.o. male who lacerated his R LF on the dorsum today with a hedge trimmer.  Has been noted to have mild DIP ext lag  Past Medical History:  Diagnosis Date  . Hypertension    Past Surgical History:  Procedure Laterality Date  . TOTAL KNEE ARTHROPLASTY     Social History   Social History  . Marital status: Single    Spouse name: N/A  . Number of children: N/A  . Years of education: N/A   Social History Main Topics  . Smoking status: Never Smoker  . Smokeless tobacco: Never Used  . Alcohol use Yes  . Drug use:   . Sexual activity: Not Asked   Other Topics Concern  . None   Social History Narrative  . None   Family History  Problem Relation Age of Onset  . Arrhythmia Mother     Has pacer  . Throat cancer Father   . Pancreatic cancer Sister   . Clotting disorder Sister     related knee surgeries  . Sickle cell trait Son    Allergies  Allergen Reactions  . Morphine And Related Rash   Prior to Admission medications   Medication Sig Start Date End Date Taking? Authorizing Provider  amLODipine (NORVASC) 10 MG tablet Take 1 tablet (10 mg total) by mouth daily. 11/29/15   Kelvin Cellar, MD  aspirin 81 MG tablet Take 81 mg by mouth daily.    Historical Provider, MD  atorvastatin (LIPITOR) 40 MG tablet Take 1 tablet (40 mg total) by mouth daily. 11/28/15   Kelvin Cellar, MD  hydrochlorothiazide (MICROZIDE) 12.5 MG capsule Take 12.5 mg by mouth daily.    Historical Provider, MD   Dg Finger Middle Right  Result Date: 05/28/2016 CLINICAL DATA:  Finger cut with hedge clippers EXAM: RIGHT THIRD FINGER 2+V COMPARISON:  None. FINDINGS: Frontal, oblique, and lateral views were obtained. There it is an incomplete fracture along the dorsal aspect of the cortex of the midportion of the third middle phalanx with a small a avulsed  fragment. Complete fracture not seen. There is soft tissue swelling in this area. No other fracture. No dislocation. The joint spaces appear normal. No radiopaque foreign body. IMPRESSION: Incomplete fracture along the cortex of the dorsal aspect of the third middle phalanx with a small avulsed fragment. No other fracture. No dislocation. Soft tissue swelling. Electronically Signed   By: Lowella Grip III M.D.   On: 05/28/2016 12:29    Positive ROS: All other systems have been reviewed and were otherwise negative with the exception of those mentioned in the HPI and as above.  Physical Exam: Vitals: Refer to EMR. Constitutional:  WD, WN, NAD HEENT:  NCAT, EOMI Neuro/Psych:  Alert & oriented to person, place, and time; appropriate mood & affect Lymphatic: No generalized extremity edema or lymphadenopathy Extremities / MSK:  The extremities are normal with respect to appearance, ranges of motion, joint stability, muscle strength/tone, sensation, & perfusion except as otherwise noted:  25 degree ext lag at DIPJ of R LF, with xverse dorsal lac 1cm in length in zone 2  Assessment: Probably R LF extensor tendon lac in Zone 2  Plan: EDP to provide 1st aid measures (irrigation, closure, splinting, antibiotics on D/C) Office will call patient to arrange f/u for Monday for wound check, and if still with significant DIP ext lag,  proceed with surgery, possibly Tuesday afternoon.  Rayvon Char [place] Silos, [address] [place], [place]  [address] Office: [phone] Mobile: [phone]  05/28/2016, 1:24 PM

## 2016-06-02 NOTE — Op Note (Signed)
06/02/2016  12:51 PM  PATIENT:  Bradley Ray  60 y.o. male  PRE-OPERATIVE DIAGNOSIS:  R LF skin and extensor tendon laceration in zone 2  POST-OPERATIVE DIAGNOSIS:  Same  PROCEDURE:   1. Excisional debridement of skin & tendon & bone associated with open fracture    2. Simple closure of traumatic wound, R LF, 2.5cm    3. Repair of R LF extensor tendon laceration in zone 2, including DIP pinning    4. Removal of sutures under anesthesia, placed by another practitioner   SURGEON: Rayvon Char. [place] Silos, MD  PHYSICIAN ASSISTANT: Morley Kos, OPA-C  ANESTHESIA:  local and MAC  SPECIMENS:  None  DRAINS:   None  EBL:  less than 50 mL  PREOPERATIVE INDICATIONS:  Bradley Ray is a  60 y.o. male with an injury to the right long finger created from hedge tremors, resulting in extensor tendon laceration in zone 2 in addition to the traumatic wound of the skin.  The risks benefits and alternatives were discussed with the patient preoperatively including but not limited to the risks of infection, bleeding, nerve injury, cardiopulmonary complications, the need for revision surgery, among others, and the patient verbalized understanding and consented to proceed.  OPERATIVE IMPLANTS: 0.045 inch K wire 1  OPERATIVE PROCEDURE:  After receiving prophylactic antibiotics, the patient was escorted to the operative theatre and placed in a supine position.  A surgical "time-out" was performed during which the planned procedure, proposed operative site, and the correct patient identity were compared to the operative consent and agreement confirmed by the circulating nurse according to current facility policy.  A digital block was performed by me with a mixture of lidocaine and Marcaine bearing epinephrine.  Following application of a tourniquet to the operative extremity, the exposed skin was prepped with Chloraprep and draped in the usual sterile fashion.  The limb was exsanguinated with an Esmarch  bandage and the tourniquet inflated to approximately 143mmHg higher than systolic BP.  The aspects of the open traumatic wound were addressed first, with removal of the sutures.  Next, the skin edges were sharply debrided excisionally with scissors and forceps, to bring the d-dimer lies jagged tissue at the edges.  Next, to create greater exposure, the traumatic wound was incorporated into a surgical approach for the deeper structures, extending one limb proximally and one limb distally on opposite sides of the digit.  This created flaps a to be retracted.  There were a couple of bone fragments where the hedge trimmer had injured the tendon and also the bone, and these were excised.  The distal tendon edges, particularly of the distal stump, were also trimmed back 1/2-1 mm to arrive it more healthy-appearing tendon.  The wound was copiously irrigated.  Thus with the open wound and devitalized structures having been thus treated, attention was directed to repair of the extensor tendon.  It was lacerated transversely in zone 2.  This was repaired with a running suture of 4-0 Supramid, as a 2 strand running stitch.  This nearly fully approximated the tendon, although it was snug with the joint fully extended.  The suture was tied.  A 0.045 inch K wire was driven longitudinally from the tip of the digit across the DIP joint securing it, protecting the tendon repair.  It was clipped and the end allowed to retract deep to the skin.  With the tendon having been repaired in the finger thus stabilized, the tourniquet was released, some additional hemostasis obtained with  bipolar electrocautery.  The traumatic open wound was first closed with interrupted 4-0 Vicryl Rapide suture.  Next, the surgically created incision was closed with similar suture.  A splint dressing was applied with a dorsal tongue blade component that immobilized the DIP joint alone, allowing the MP and PIP joints to flex.  He was taken to the recovery  room in stable condition.  DISPOSITION: He'll be discharged home today, returning to see therapy in a proximally 5 days to have the splint dressing removed, a protective splint fabricated and initiation of rehabilitation.  He will return to see me in approximately 10-15 days for a splint and wound check.

## 2016-06-02 NOTE — Discharge Instructions (Addendum)
Discharge Instructions   You have a dressing with a splint incorporated in it. Move your fingers as much as possible, making a full fist and fully opening the fist. Elevate your hand to reduce pain & swelling of the digits.  Ice over the operative site may be helpful to reduce pain & swelling.  DO NOT USE HEAT. Pain medicine has been prescribed for you.  Use your medicine as needed over the first 48 hours, and then you can begin to taper your use.  You may use Tylenol in place of your prescribed pain medication, but not IN ADDITION to it. Leave the dressing in place until you return to our office.  You may shower, but keep the bandage clean & dry.  You may drive a car when you are off of prescription pain medications and can safely control your vehicle with both hands. Our office will call you to arrange follow-up   Please call [phone] during normal business hours or [phone] after hours for any problems. Including the following:  - excessive redness of the incisions - drainage for more than 4 days - fever of more than 101.5 F  *Please note that pain medications will not be refilled after hours or on weekends.   Post Anesthesia Home Care Instructions  Activity: Get plenty of rest for the remainder of the day. A responsible adult should stay with you for 24 hours following the procedure.  For the next 24 hours, DO NOT: -Drive a car -Paediatric nurse -Drink alcoholic beverages -Take any medication unless instructed by your physician -Make any legal decisions or sign important papers.  Meals: Start with liquid foods such as gelatin or soup. Progress to regular foods as tolerated. Avoid greasy, spicy, heavy foods. If nausea and/or vomiting occur, drink only clear liquids until the nausea and/or vomiting subsides. Call your physician if vomiting continues.  Special Instructions/Symptoms: Your throat may feel dry or sore from the anesthesia or the breathing tube placed in  your throat during surgery. If this causes discomfort, gargle with warm salt water. The discomfort should disappear within 24 hours.  If you had a scopolamine patch placed behind your ear for the management of post- operative nausea and/or vomiting:  1. The medication in the patch is effective for 72 hours, after which it should be removed.  Wrap patch in a tissue and discard in the trash. Wash hands thoroughly with soap and water. 2. You may remove the patch earlier than 72 hours if you experience unpleasant side effects which may include dry mouth, dizziness or visual disturbances. 3. Avoid touching the patch. Wash your hands with soap and water after contact with the patch.

## 2016-06-02 NOTE — Interval H&P Note (Signed)
History and Physical Interval Note:  06/02/2016 12:51 PM  Bradley Ray  has presented today for surgery, with the diagnosis of RIGHT LONG FINGER EXTENSOR TENDON INJURY [phone]  The various methods of treatment have been discussed with the patient and family. After consideration of risks, benefits and other options for treatment, the patient has consented to  Procedure(s): RIGHT LONG FINGER EXTENSOR TENDON REPAIR (Right) as a surgical intervention .  The patient's history has been reviewed, patient examined, no change in status, stable for surgery.  I have reviewed the patient's chart and labs.  Questions were answered to the patient's satisfaction.     Shaquayla Klimas A.

## 2016-06-02 NOTE — Anesthesia Preprocedure Evaluation (Addendum)
Anesthesia Evaluation  Patient identified by MRN, date of birth, ID band Patient awake    Reviewed: Allergy & Precautions, NPO status , Patient's Chart, lab work & pertinent test results  History of Anesthesia Complications Negative for: history of anesthetic complications  Airway Mallampati: III  TM Distance: >3 FB Neck ROM: Full    Dental  (+) Upper Dentures, Lower Dentures   Pulmonary neg shortness of breath, neg sleep apnea, neg COPD, neg recent URI, former smoker,    Pulmonary exam normal breath sounds clear to auscultation       Cardiovascular hypertension, Pt. on medications (-) angina(-) Past MI, (-) Cardiac Stents, (-) CABG and (-) DOE  Rhythm:Regular Rate:Normal  TTE 11/28/2015: Study Conclusions  - Left ventricle: The cavity size was normal. There was mild focal   basal hypertrophy of the septum. Systolic function was normal.   The estimated ejection fraction was in the range of 50% to 55%.   Possible hypokinesis of the mid inferoseptal and apical septal   myocardium. There was an increased relative contribution of   atrial contraction to ventricular filling. Doppler parameters are   consistent with abnormal left ventricular relaxation (grade 1   diastolic dysfunction). - Aortic valve: Trileaflet; normal thickness, mildly calcified   leaflets. - Mitral valve: Calcified annulus. Mild diffuse thickening and   calcification of the anterior leaflet. - Pulmonic valve: There was trivial regurgitation.   Neuro/Psych neg Seizures    GI/Hepatic negative GI ROS, (+)     substance abuse (reports last use was 11/2015)  cocaine use,   Endo/Other  negative endocrine ROSneg diabetes  Renal/GU negative Renal ROS     Musculoskeletal   Abdominal (+) - obese,   Peds  Hematology  (+) Blood dyscrasia, anemia ,   Anesthesia Other Findings HLD, h/o splenic infarct  Reproductive/Obstetrics                             Anesthesia Physical Anesthesia Plan  ASA: II  Anesthesia Plan: MAC   Post-op Pain Management:    Induction: Intravenous  Airway Management Planned: Natural Airway and Nasal Cannula  Additional Equipment:   Intra-op Plan:   Post-operative Plan:   Informed Consent: I have reviewed the patients History and Physical, chart, labs and discussed the procedure including the risks, benefits and alternatives for the proposed anesthesia with the patient or authorized representative who has indicated his/her understanding and acceptance.   Dental advisory given  Plan Discussed with:   Anesthesia Plan Comments:         Anesthesia Quick Evaluation

## 2016-06-02 NOTE — Transfer of Care (Signed)
Immediate Anesthesia Transfer of Care Note  Patient: Bradley Ray  Procedure(s) Performed: Procedure(s): RIGHT LONG FINGER EXTENSOR TENDON REPAIR (Right)  Patient Location: PACU  Anesthesia Type:MAC  Level of Consciousness: awake, alert  and oriented  Airway & Oxygen Therapy: Patient Spontanous Breathing and Patient connected to face mask oxygen  Post-op Assessment: Report given to RN and Post -op Vital signs reviewed and stable  Post vital signs: Reviewed and stable  Last Vitals:  Vitals:   06/02/16 1037 06/02/16 1345  BP: 115/77   Pulse: 75   Resp: 20   Temp: 36.8 C 36.7 C    Last Pain:  Vitals:   06/02/16 1345  TempSrc:   PainSc: 0-No pain         Complications: No apparent anesthesia complications

## 2016-06-03 NOTE — Addendum Note (Signed)
Addendum  created 06/03/16 1327 by Ernesta Amble Jelisha Weed, CRNA   Charge Capture section accepted

## 2016-06-04 ENCOUNTER — Encounter (HOSPITAL_BASED_OUTPATIENT_CLINIC_OR_DEPARTMENT_OTHER): Payer: Self-pay | Admitting: Orthopedic Surgery

## 2016-06-11 ENCOUNTER — Encounter: Payer: Self-pay | Admitting: Occupational Therapy

## 2016-06-11 ENCOUNTER — Ambulatory Visit: Payer: Medicaid Other | Attending: Orthopedic Surgery | Admitting: Occupational Therapy

## 2016-06-11 DIAGNOSIS — M6281 Muscle weakness (generalized): Secondary | ICD-10-CM

## 2016-06-11 DIAGNOSIS — M79644 Pain in right finger(s): Secondary | ICD-10-CM | POA: Insufficient documentation

## 2016-06-11 DIAGNOSIS — R29898 Other symptoms and signs involving the musculoskeletal system: Secondary | ICD-10-CM | POA: Diagnosis present

## 2016-06-11 DIAGNOSIS — M25641 Stiffness of right hand, not elsewhere classified: Secondary | ICD-10-CM | POA: Diagnosis not present

## 2016-06-11 NOTE — Patient Instructions (Signed)
  WEARING SCHEDULE:  Wear splint at ALL times except for hygiene care 1x/day. Keep finger straight while cleaning  PURPOSE:  To prevent movement and for protection until injury can heal  CARE OF SPLINT:  Keep splint away from heat sources including: stove, radiator or furnace, or a car in sunlight. The splint can melt and will no longer fit you properly  Keep away from pets and children  Clean the splint with rubbing alcohol 1-2 times per day.  * During this time, make sure you also clean your hand/arm as instructed by your therapist and/or perform dressing changes as needed. Then dry hand/arm completely before replacing splint. (When cleaning hand/arm, keep it immobilized in same position until splint is replaced)  PRECAUTIONS/POTENTIAL PROBLEMS: *If you notice or experience increased pain, swelling, numbness, or a lingering reddened area from the splint: Contact your therapist immediately by calling [phone]. You must wear the splint for protection, but we will get you scheduled for adjustments as quickly as possible.  (If only straps or hooks need to be replaced and NO adjustments to the splint need to be made, just call the office ahead and let them know you are coming in)  If you have any medical concerns or signs of infection, please call your doctor immediately

## 2016-06-11 NOTE — Therapy (Signed)
[facility] [address] [place], [place], [address] Phone: [phone]   Fax:  [phone]  Occupational Therapy Evaluation  Patient Details  Name: Bradley Ray MRN: [id number] Date of Birth: [birth year] Referring Provider: Dr. Rayvon Char. [place] Silos  Encounter Date: 06/11/2016      OT End of Session - 06/11/16 1138    Visit Number 1   Number of Visits 4   Authorization Type MCD - awaiting authorization   OT Start Time [id number]   OT Stop Time 1115   OT Time Calculation (min) 60 min   Equipment Utilized During Treatment splint   Activity Tolerance Patient tolerated treatment well      Past Medical History:  Diagnosis Date  . Hypertension     Past Surgical History:  Procedure Laterality Date  . REPAIR EXTENSOR TENDON Right 06/02/2016   Procedure: RIGHT LONG FINGER EXTENSOR TENDON REPAIR;  Surgeon: Milly Jakob, MD;  Location: [facility];  Service: Orthopedics;  Laterality: Right;  . TOTAL KNEE ARTHROPLASTY      There were no vitals filed for this visit.      Subjective Assessment - 06/11/16 1023    Subjective  No pain today, it just itches now   Pertinent History see EPIC   Limitations per extensor tendon protocol zone II   Patient Stated Goals to get my finger better   Currently in Pain? No/denies           [facility] OT Assessment - 06/11/16 0001      Assessment   Diagnosis s/p extensor tendon repair zone II Rt long finger   Referring Provider Dr. Rayvon Char. Thompson   Onset Date 06/02/16  is surgery date (original injury 05/28/16)   Assessment Pt arrived wrapped and protected Rt long finger   Prior Therapy none     Precautions   Precautions --  per protocol   Precaution Comments per extensor tendon protocol zone II   Required Braces or Orthoses Other Brace/Splint   Other Brace/Splint finger gutter splint to immobolize PIP/DIP joints of involved finger in full extension (per protocol)      Restrictions   Weight Bearing Restrictions No     Balance Screen   Has the patient fallen in the past 6 months No   Has the patient had a decrease in activity level because of a fear of falling?  No   Is the patient reluctant to leave their home because of a fear of falling?  No     Home  Environment   Additional Comments Pt lives in apt.    Lives With Alone  but girlfriend currently there to assist with all ADL's     Prior Function   Level of Independence Independent   Vocation On disability   Vocation Requirements none     ADL   ADL comments Pt requires assist for dressing/bathing. Girlfriend doing all IADLS at this time     Mobility   Mobility Status Independent   Mobility Status Comments occasionally walks with cane if Lt knee is bothering him     Written Expression   Dominant Hand Right   Handwriting --  currently unable      Edema   Edema mild Rt long finger                  OT Treatments/Exercises (OP) - 06/11/16 0001      ADLs   ADL Comments Carefully unwrapped protective coban and dorsal  finger splint pt arrived in while keeping finger in full extension. Cleaned hand and finger and instructed pt in hygiene care, and precautions. Pt also instructed to wear finger splint while in shower with latex free glove on over finger splint. Pt provided with finger compression stockinette, several gloves, coban, and extra straps/hooks.      Splinting   Splinting Fabricated and fitted finger gutter splint for Rt long finger with PIP and DIP joints in full extension per protocol. Issued splint and educated pt in wear and care.               OT Education - 06/11/16 1107    Education provided Yes   Education Details splint wear and care, hygiene care, and precautions   Person(s) Educated Patient   Methods Explanation;Demonstration;Handout   Comprehension Verbalized understanding;Returned demonstration          OT Short Term Goals - 06/11/16 1145       OT SHORT TERM GOAL #1   Title Independent with splint wear and care   Baseline issued, may need adjustments   Time 4   Period Weeks   Status On-going     OT SHORT TERM GOAL #2   Title Independent with initial HEP for A/ROM   Baseline Dependent - unable to begin until 4 weeks post-op   Time 4   Period Weeks   Status New           OT Long Term Goals - 06/11/16 1146      OT [place] #1   Title Independent with updated HEP    Baseline Dependent d/t current precautions   Time 8   Period Weeks   Status New     OT LONG TERM GOAL #2   Title Pt to demo adequate ROM Rt long finger to make 90% or greater composite fist    Baseline dependent d/t current precautions   Time 8   Period Weeks   Status New     OT LONG TERM GOAL #3   Title Pt to return to using Rt hand as dominant hand for BADLS including writing and eating   Baseline dependent at this time   Time 8   Period Weeks   Status New     OT LONG TERM GOAL #4   Title Pt to demo 25 lbs grip strength or greater Rt hand for opening containers/jars   Baseline dependent d/t current precautions   Time 8   Period Weeks   Status New               Plan - 06/11/16 1140    Clinical Impression Statement Pt is a 60 y.o. male who presents to outpatient rehab fully wrapped and protected s/p extensor tendon repair, zone II [id number]) Rt long finger with surgery date 06/02/16. Pt is Rt handed, therefore limited with all ADLS including writing and feeding self.    Rehab Potential Good   OT Frequency --  3 visits over 10 week duration requested from MCD (Due to protocol)    OT Treatment/Interventions Self-care/ADL training;Moist Heat;Fluidtherapy;DME and/or AE instruction;Splinting;Patient/family education;Contrast Bath;Compression bandaging;Therapeutic exercises;Scar mobilization;Therapeutic activities;Ultrasound;Cryotherapy;Passive range of motion;Electrical Stimulation;Parrafin;Manual Therapy   Plan Pt to return on 06/30/16  at 4 weeks post-op to progress per protocol. (Pt advised to call and return sooner if any problems arise with splint - pt agreed)    Consulted and Agree with Plan of Care Patient      Patient will benefit from skilled  therapeutic intervention in order to improve the following deficits and impairments:  Decreased coordination, Decreased range of motion, Impaired flexibility, Increased edema, Impaired sensation, Decreased knowledge of precautions, Decreased skin integrity, Impaired UE functional use, Pain, Decreased mobility, Decreased strength  Visit Diagnosis: Stiffness of right hand, not elsewhere classified - Plan: Ot plan of care cert/re-cert  Pain in right finger(s) - Plan: Ot plan of care cert/re-cert  Other symptoms and signs involving the musculoskeletal system - Plan: Ot plan of care cert/re-cert  Muscle weakness (generalized) - Plan: Ot plan of care cert/re-cert    Problem List Patient Active Problem List   Diagnosis Date Noted  . 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 Essential hypertension 11/26/2015  . 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 High anion gap metabolic acidosis [id number]  . 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 [place], [place], [address] Phone: [phone]   Fax:  [phone]  Name: Bradley Ray MRN: [id number] Date of Birth: 1955/11/19

## 2016-06-29 ENCOUNTER — Ambulatory Visit: Payer: Medicaid Other | Attending: Orthopedic Surgery | Admitting: *Deleted

## 2016-06-29 ENCOUNTER — Encounter: Payer: Self-pay | Admitting: *Deleted

## 2016-06-29 DIAGNOSIS — M6281 Muscle weakness (generalized): Secondary | ICD-10-CM

## 2016-06-29 DIAGNOSIS — M79644 Pain in right finger(s): Secondary | ICD-10-CM | POA: Insufficient documentation

## 2016-06-29 DIAGNOSIS — R29898 Other symptoms and signs involving the musculoskeletal system: Secondary | ICD-10-CM | POA: Diagnosis present

## 2016-06-29 DIAGNOSIS — M25641 Stiffness of right hand, not elsewhere classified: Secondary | ICD-10-CM | POA: Insufficient documentation

## 2016-06-29 NOTE — Therapy (Signed)
[facility] [address] [place], [place], [address] Phone: [phone]   Fax:  [phone]  Occupational Therapy Treatment  Patient Details  Name: Bradley Ray MRN: [id number] Date of Birth: 1956/03/19 Referring Provider: Dr. Rayvon Char. [place] Silos  Encounter Date: 06/29/2016      OT End of Session - 06/29/16 1125    Visit Number 2   Number of Visits 4   Authorization Type MCD -    Authorization - Visit Number 2   Authorization - Number of Visits 4   OT Start Time 0933   OT Stop Time 1011   OT Time Calculation (min) 38 min   Equipment Utilized During Treatment splint   Activity Tolerance Patient tolerated treatment well   Behavior During Therapy WFL for tasks assessed/performed      Past Medical History:  Diagnosis Date  . Hypertension     Past Surgical History:  Procedure Laterality Date  . REPAIR EXTENSOR TENDON Right 06/02/2016   Procedure: RIGHT LONG FINGER EXTENSOR TENDON REPAIR;  Surgeon: Milly Jakob, MD;  Location: [facility];  Service: Orthopedics;  Laterality: Right;  . TOTAL KNEE ARTHROPLASTY      There were no vitals filed for this visit.      Subjective Assessment - 06/29/16 0938    Subjective  Pt denies pain, he reports itching "especially at the tip" of R LF.   Pertinent History see EPIC   Limitations per extensor tendon protocol zone II   Patient Stated Goals to get my finger better   Currently in Pain? No/denies   Multiple Pain Sites No                      OT Treatments/Exercises (OP) - 06/29/16 0001      Exercises   Exercises Hand     Hand Exercises   Other Hand Exercises Upgraded home program as per protocol for R LF extensor tendon repair zone II at 4 weeks post-op. Pt was instructed in and performed AROM for blocked MP, blocked PIP, active gentle composite fist, all exercises followed by full extension of RLF.  Pt was educated in precaution of  wearing splint between exercise sessions and at noc for protection, as well as monitoring for possible extensor lag to LF. He performed all ex's in clinic today and verbalized understanding of monitoring for extensor lag RLF.     Splinting   Splinting Splint check and adjustments to straps that were not adhering. Issued finger stockinette as well. Pt I'ly reports cleaning splint and using sterile water for wound care. No drainage noted, no s/s of possible infection noted at this time L LF. Pt I'ly donned and doffed splint in clinic                OT Education - 06/29/16 1124    Education provided Yes   Education Details Splint wear and care, home program per protocol for extensor tendon repair @ 4 weeks post-op.   Person(s) Educated Patient   Methods Explanation;Demonstration;Handout   Comprehension Verbalized understanding;Returned demonstration          OT Short Term Goals - 06/11/16 1145      OT SHORT TERM GOAL #1   Title Independent with splint wear and care   Baseline issued, may need adjustments   Time 4   Period Weeks   Status On-going     OT SHORT TERM GOAL #2   Title Independent with  initial HEP for A/ROM   Baseline Dependent - unable to begin until 4 weeks post-op   Time 4   Period Weeks   Status New           OT Long Term Goals - 06/11/16 1146      OT LONG TERM GOAL #1   Title Independent with updated HEP    Baseline Dependent d/t current precautions   Time 8   Period Weeks   Status New     OT LONG TERM GOAL #2   Title Pt to demo adequate ROM Rt long finger to make 90% or greater composite fist    Baseline dependent d/t current precautions   Time 8   Period Weeks   Status New     OT LONG TERM GOAL #3   Title Pt to return to using Rt hand as dominant hand for BADLS including writing and eating   Baseline dependent at this time   Time 8   Period Weeks   Status New     OT LONG TERM GOAL #4   Title Pt to demo 25 lbs grip strength or  greater Rt hand for opening containers/jars   Baseline dependent d/t current precautions   Time 8   Period Weeks   Status New               Plan - 06/29/16 1126    Clinical Impression Statement Pt home program was upgraded today per protocol following extensor tendon repair to RLF. He is currently 4 weeks post op and began gentle active blocked MP, PIP flexion/extension and gentle composite fisting today followed by full digital extension in clinic. He was also instructed to monitor for possible extensor lag and verbalized understanding of this. He will follow up in 2 weeks for progression of home program per protocol R LF.   Rehab Potential Good   OT Frequency --  Eval + 3 visits over 10 week duration (due to insurance - MCD)   OT Treatment/Interventions Self-care/ADL training;Moist Heat;Fluidtherapy;DME and/or AE instruction;Splinting;Patient/family education;Contrast Bath;Compression bandaging;Therapeutic exercises;Scar mobilization;Therapeutic activities;Ultrasound;Cryotherapy;Passive range of motion;Electrical Stimulation;Parrafin;Manual Therapy   Plan Pt will return in 2 weeks for progression and upgrade of home program per protocol following RLF extensor tendon repair zone 2. He will be 6 weeks post-op in 2 weeks (Pt was advised to call sooner if any questions/concern arise esp. extenor tendon lag - pt agreed)   Consulted and Agree with Plan of Care Patient      Patient will benefit from skilled therapeutic intervention in order to improve the following deficits and impairments:  Decreased coordination, Decreased range of motion, Impaired flexibility, Increased edema, Impaired sensation, Decreased knowledge of precautions, Decreased skin integrity, Impaired UE functional use, Pain, Decreased mobility, Decreased strength  Visit Diagnosis: Pain in right finger(s)  Other symptoms and signs involving the musculoskeletal system  Muscle weakness (generalized)  Stiffness of right  hand, not elsewhere classified    Problem List Patient Active Problem List   Diagnosis Date Noted  . 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 Essential hypertension 11/26/2015  . 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 High anion gap metabolic acidosis [id number]  . Substance abuse in remission 11/26/2015    Almyra Deforest, OTR/L 06/29/2016, 11:34 AM  [facility] [address] [place], [place], [address] Phone: [phone]   Fax:  [phone]  Name: JACCOB MONDO MRN: [id number] Date of Birth: 1956-01-26

## 2016-06-29 NOTE — Patient Instructions (Addendum)
MP Flexion (Active Blocked)    Using other hand to brace base of middle finger, bend as far as possible with tip joint held straight. Repeat _5-10_ times. Do _4-6_ sessions per day.  MP Extension (Active Blocked)    Hold base of middle finger with other hand. Straighten thumb. Repeat 5-10_ times. Do _4-6_ sessions per day.  PIP Flexion (Active Blocked)    Hold large knuckle straight using other hand. Bend middle joint of middle finger as far as possible. Hold _5__ seconds. Repeat 5-10_ times. Do __4-6_ sessions per day.   Gently make a fist with your Right hand, do not force movement. Hold 3-5 seconds, repeat 5-10 times, 4-6 sessions a day. Straighten fingers after bending.   Wear splint in-between all exercises and at night for protection.  Continue to clean finger as previously instructed.

## 2016-07-14 ENCOUNTER — Ambulatory Visit: Payer: Medicaid Other | Attending: Orthopedic Surgery | Admitting: Occupational Therapy

## 2016-07-14 DIAGNOSIS — M79644 Pain in right finger(s): Secondary | ICD-10-CM | POA: Diagnosis not present

## 2016-07-14 DIAGNOSIS — M25641 Stiffness of right hand, not elsewhere classified: Secondary | ICD-10-CM | POA: Insufficient documentation

## 2016-07-14 DIAGNOSIS — R29898 Other symptoms and signs involving the musculoskeletal system: Secondary | ICD-10-CM | POA: Insufficient documentation

## 2016-07-14 NOTE — Patient Instructions (Signed)
   START WITH ALL ACTIVE EXERCISES FIRST. THEN PROGRESS TO PASSIVE EXERCISES BELOW   1. PIP / DIP Composite Flexion (Passive Stretch)    Use other hand to bend middle and tip joints of ___long___ finger. Hold __5-10__ seconds. Repeat __10__ times. Do __6__ sessions per day.   2. MP / PIP / DIP Composite Flexion (Passive Stretch)    Use other hand to bend __long____ finger at big knuckle and middle joint only - NOT TIP JOINT. Hold _10___ seconds. Repeat __10__ times. Do __6__ sessions per day. Then straighten finger all the way keeping big knuckle bent!  PIP Extension (Active Controlled With Wrist and MP Flexion)    Keep wrist neutral, NOT bent. Hold big knuckles bent at __75__ flexion, straighten end joints of fingers. Repeat _10_ times. Do __6__ sessions per day.  Copyright  VHI. All rights reserved.

## 2016-07-14 NOTE — Therapy (Signed)
[facility] [address] [place], [place], [address] Phone: [phone]   Fax:  [phone]  Occupational Therapy Treatment  Patient Details  Name: Bradley Ray MRN: [id number] Date of Birth: Jul 21, 1956 Referring Provider: Dr. Rayvon Ray. [place] Silos  Encounter Date: 07/14/2016      OT End of Session - 07/14/16 1010    Visit Number 3   Number of Visits 4   Authorization Type MCD -    OT Start Time 0925   OT Stop Time 1015   OT Time Calculation (min) 50 min   Activity Tolerance Patient tolerated treatment well      Past Medical History:  Diagnosis Date  . Hypertension     Past Surgical History:  Procedure Laterality Date  . REPAIR EXTENSOR TENDON Right 06/02/2016   Procedure: RIGHT LONG FINGER EXTENSOR TENDON REPAIR;  Surgeon: Bradley Jakob, MD;  Location: [facility];  Service: Orthopedics;  Laterality: Right;  . TOTAL KNEE ARTHROPLASTY      There were no vitals filed for this visit.      Subjective Assessment - 07/14/16 0934    Subjective  No pain at rest. I have a "rod" at my tip joint that will not be removed until my next MD appt on 08/08/16 and I can't bend my tip joint   Pertinent History Rt long finger extensor tendon repair zone II on 06/02/16 with DIP pinning    Limitations per extensor tendon protocol zone II, no DIP motion until pin removed   Patient Stated Goals to get my finger better   Currently in Pain? Yes   Pain Score 7    Pain Location Finger (Comment which one)  long   Pain Orientation Right   Pain Descriptors / Indicators Sharp;Sore   Pain Type Acute pain   Pain Onset More than a month ago   Aggravating Factors  P/ROM   Pain Relieving Factors Rest            [facility] OT Assessment - 07/14/16 0001      Right Hand AROM   R Long PIP 0-100 75 Degrees  ext WNL's     Right Hand PROM   R Long PIP 0-100 85 Degrees  after stretching                  OT  Treatments/Exercises (OP) - 07/14/16 0001      ADLs   ADL Comments Pt reports "rod" at tip joint. Surgery notes indicate DIP pinning and pt reports will not be removed until 08/08/16. Pt not allowed to bend DIP joint until then.  Also noted pain up to 9/10 with P/ROM at moderate edema at PIP joint Rt long finger. Pt advised to take tylenol 30 min. prior to next appt and to ice as needed after exercising     Exercises   Exercises Hand     Hand Exercises   PIPJ Flexion AROM;PROM;10 reps   PIPJ Extension AROM;PROM;10 reps  with MP blocked in flexion   Other Hand Exercises Reviewed previously issued A/ROM exercises. Began P/ROM ex's per 6 week post op protocol (with exception to no movement at DIP joint)      Modalities   Modalities Fluidotherapy     RUE Fluidotherapy   Number Minutes Fluidotherapy 12 Minutes   RUE Fluidotherapy Location Hand;Wrist   Comments to decrease pain and stiffness                OT  Education - 07/14/16 1002    Education provided Yes   Education Details Review of A/ROM HEP, Issued P/ROM HEP per 6 week post op protocol   Person(s) Educated Patient   Methods Explanation;Demonstration;Handout   Comprehension Verbalized understanding;Returned demonstration          OT Short Term Goals - 07/14/16 1011      OT SHORT TERM GOAL #1   Title Independent with splint wear and care   Baseline issued, may need adjustments   Time 4   Period Weeks   Status Achieved     OT SHORT TERM GOAL #2   Title Independent with initial HEP for A/ROM   Baseline Dependent - unable to begin until 4 weeks post-op   Time 4   Period Weeks   Status Achieved           OT Long Term Goals - 06/11/16 1146      OT LONG TERM GOAL #1   Title Independent with updated HEP    Baseline Dependent d/t current precautions   Time 8   Period Weeks   Status New     OT LONG TERM GOAL #2   Title Pt to demo adequate ROM Rt long finger to make 90% or greater composite fist     Baseline dependent d/t current precautions   Time 8   Period Weeks   Status New     OT LONG TERM GOAL #3   Title Pt to return to using Rt hand as dominant hand for BADLS including writing and eating   Baseline dependent at this time   Time 8   Period Weeks   Status New     OT LONG TERM GOAL #4   Title Pt to demo 25 lbs grip strength or greater Rt hand for opening containers/jars   Baseline dependent d/t current precautions   Time 8   Period Weeks   Status New               Plan - 07/14/16 1011    Clinical Impression Statement Pt met STG's. Pt progressing with protocol, but remains stiff, sore and swollen at PIP joint Rt long finger.    Rehab Potential Good   OT Treatment/Interventions Self-care/ADL training;Moist Heat;Fluidtherapy;DME and/or AE instruction;Splinting;Patient/family education;Contrast Bath;Compression bandaging;Therapeutic exercises;Scar mobilization;Therapeutic activities;Ultrasound;Cryotherapy;Passive range of motion;Electrical Stimulation;Parrafin;Manual Therapy   Plan Pt to return in 2 weeks to progress protocol - begin light strengthening and  d/c splint during day per 8 week post-op protocol   Consulted and Agree with Plan of Care Patient      Patient will benefit from skilled therapeutic intervention in order to improve the following deficits and impairments:  Decreased coordination, Decreased range of motion, Impaired flexibility, Increased edema, Impaired sensation, Decreased knowledge of precautions, Decreased skin integrity, Impaired UE functional use, Pain, Decreased mobility, Decreased strength  Visit Diagnosis: Pain in right finger(s)  Other symptoms and signs involving the musculoskeletal system  Stiffness of right hand, not elsewhere classified    Problem List Patient Active Problem List   Diagnosis Date Noted  . 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 Essential hypertension 11/26/2015  . 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 High anion gap  metabolic acidosis 11/26/2015  . Substance abuse in remission 11/26/2015    Bradley Ray, OTR/L 07/14/2016, 10:17 AM  [facility] [address] [place], , [address] Phone: [phone]   Fax:  [phone]  Name: Bradley Ray MRN: [id number] Date of   Birth: 04-01-1956

## 2016-07-28 ENCOUNTER — Ambulatory Visit: Payer: Medicaid Other | Admitting: Occupational Therapy

## 2016-08-10 ENCOUNTER — Ambulatory Visit
Admission: RE | Admit: 2016-08-10 | Discharge: 2016-08-10 | Disposition: A | Discharge status: Home / Self Care | Payer: Medicaid Other | Source: Ambulatory Visit | Attending: Radiation Oncology | Admitting: Radiation Oncology

## 2016-08-10 ENCOUNTER — Encounter: Payer: Self-pay | Admitting: Medical Oncology

## 2016-08-10 ENCOUNTER — Encounter: Payer: Self-pay | Admitting: Radiation Oncology

## 2016-08-10 VITALS — BP 133/87 | HR 75 | Resp 16 | Ht 65.0 in | Wt 178.2 lb

## 2016-08-10 DIAGNOSIS — C61 Malignant neoplasm of prostate: Secondary | ICD-10-CM | POA: Diagnosis not present

## 2016-08-10 DIAGNOSIS — Z79899 Other long term (current) drug therapy: Secondary | ICD-10-CM | POA: Diagnosis not present

## 2016-08-10 DIAGNOSIS — Z808 Family history of malignant neoplasm of other organs or systems: Secondary | ICD-10-CM | POA: Diagnosis not present

## 2016-08-10 DIAGNOSIS — Z885 Allergy status to narcotic agent status: Secondary | ICD-10-CM | POA: Diagnosis not present

## 2016-08-10 DIAGNOSIS — Z87891 Personal history of nicotine dependence: Secondary | ICD-10-CM | POA: Insufficient documentation

## 2016-08-10 DIAGNOSIS — Z800 Family history of malignant neoplasm of digestive organs: Secondary | ICD-10-CM | POA: Diagnosis not present

## 2016-08-10 DIAGNOSIS — Z7982 Long term (current) use of aspirin: Secondary | ICD-10-CM | POA: Insufficient documentation

## 2016-08-10 DIAGNOSIS — I10 Essential (primary) hypertension: Secondary | ICD-10-CM | POA: Insufficient documentation

## 2016-08-10 HISTORY — DX: Malignant neoplasm of prostate: C61

## 2016-08-10 NOTE — Progress Notes (Signed)
GU Location of Tumor / Histology:   If Prostate Cancer, Gleason Score is ( 3+3=6) and PSA is (6.48)  Bradley Ray had a routine follow up with [facility] and a PSA was drawn which proved to be elevated. [facility] then, referred him to Dr. Jeffie Pollock.  Biopsies of prostate (if applicable) revealed:   Past/Anticipated interventions by urology, if any: biopsy, referral to Dr. Tammi Klippel to discuss "seeds."  Past/Anticipated interventions by medical oncology, if any: no  Weight changes, if any: reports a weight gain of 8 lbs in two months   Bowel/Bladder complaints, if any: Denies dysuria, hematuria, leakage or incontinence. IPSS 16 with incomplete emptying, frequency, intermittency, weak stream and nocturia x 3. Denies any bowel complaints.  Nausea/Vomiting, if any: no  Pain issues, if any:  no  SAFETY ISSUES:  Prior radiation? no  Pacemaker/ICD? no  Possible current pregnancy? no  Is the patient on methotrexate? no  Current Complaints / other details:  60 year old male. Has a girlfriend (same one for 24 years but, they live seperately). Disabled. Three kids. [employer] bus to consult.

## 2016-08-10 NOTE — Progress Notes (Signed)
See progress note under physician encounter. 

## 2016-08-10 NOTE — Progress Notes (Signed)
Radiation Oncology         [phone] ________________________________  Initial Outpatient Consultation  Name: Bradley Ray MRN: [id number]  Date: 08/10/2016  DOB: Jan 30, 1956  [id number] A Bradley Cooks, MD  Bradley Seal, MD   REFERRING PHYSICIAN: [name], MD  DIAGNOSIS: 60 y.o. gentleman with stage T1c adenocarcinoma of the prostate with a Gleason's score of 3+3 and a PSA of 6.48    ICD-9-CM ICD-10-CM   1. Malignant neoplasm of prostate ([place]) Bradley Ray is a 60 y.o. gentleman.  He was noted to have an elevated PSA of 6.48 at a routine follow up with [facility].  Accordingly, he was referred for evaluation in urology by Dr. Jeffie Ray on 04/15/16,  digital rectal examination was performed at that time revealing no nodularity and a normal sized prostate.  The patient proceeded to transrectal ultrasound with 12 biopsies of the prostate on 07/02/16.  The prostate volume measured 48.44 cc.  Out of 12 core biopsies,4 were positive.  The maximum Gleason score was 6, and this was seen in the left mid, left apex, right mid lateral, and right apex lateral.  The patient reviewed the biopsy results with his urologist and he has kindly been referred today for discussion of potential radiation treatment options.    PREVIOUS RADIATION THERAPY: No  PAST MEDICAL HISTORY:  has a past medical history of Hypertension and Prostate cancer ([place]).    PAST SURGICAL HISTORY: Past Surgical History:  Procedure Laterality Date  . PROSTATE BIOPSY    . REPAIR EXTENSOR TENDON Right 06/02/2016   Procedure: RIGHT LONG FINGER EXTENSOR TENDON REPAIR;  Surgeon: Bradley Jakob, MD;  Location: [facility];  Service: Orthopedics;  Laterality: Right;  . TOTAL KNEE ARTHROPLASTY      FAMILY HISTORY: family history includes Arrhythmia in his mother; Cancer in his maternal aunt, maternal uncle, other, and sister; Clotting disorder in his sister; Pancreatic cancer in his  sister; Sickle cell trait in his son; Throat cancer in his father.  SOCIAL HISTORY:  reports that he quit smoking about 10 years ago. His smoking use included Cigarettes. He has a 10.00 pack-year smoking history. He has never used smokeless tobacco. He reports that he drinks about 1.8 oz of alcohol per week . He reports that he uses drugs, including "Crack" cocaine, about 1 time per week.  ALLERGIES: Morphine and related  MEDICATIONS:  Current Outpatient Prescriptions  Medication Sig Dispense Refill  . amLODipine (NORVASC) 10 MG tablet Take 1 tablet (10 mg total) by mouth daily. 30 tablet 1  . aspirin 81 MG tablet Take 81 mg by mouth daily.    Marland Kitchen atorvastatin (LIPITOR) 40 MG tablet Take 1 tablet (40 mg total) by mouth daily. 30 tablet 1  . hydrochlorothiazide (MICROZIDE) 12.5 MG capsule Take 12.5 mg by mouth daily.     No current facility-administered medications for this encounter.     REVIEW OF SYSTEMS:  A 15 point review of systems is documented in the electronic medical record. This was obtained by the nursing staff. However, I reviewed this with the patient to discuss relevant findings and make appropriate changes.  Pertinent items are noted in HPI..  The patient completed an IPSS and IIEF questionnaire.  His IPSS score was 16 indicating moderate urinary outflow obstructive symptoms. He is positive for incomplete emptying, frequency, intermittency, weak stream, and nocturia x 3. He indicated that his erectile function is 15 to complete sexual activity  half of the time. Additionally, patient notes an 8 lb weight gain in the past 2 months. He denies nausea/vomiting, or pain at this time.   PHYSICAL EXAM: This patient is in no acute distress.  He is alert and oriented.   height is 5\' 5"  (1.651 m) and weight is 178 lb 3.2 oz (80.8 kg). His blood pressure is 133/87 and his pulse is 75. His respiration is 16 and oxygen saturation is 100%.  He exhibits no respiratory distress or labored breathing.   He appears neurologically intact.  His mood is pleasant.  His affect is appropriate.  Please note the digital rectal exam findings described above.  KPS = 100  100 - Normal; no complaints; no evidence of disease. 90   - Able to carry on normal activity; minor signs or symptoms of disease. 80   - Normal activity with effort; some signs or symptoms of disease. 52   - Cares for self; unable to carry on normal activity or to do active work. 60   - Requires occasional assistance, but is able to care for most of his personal needs. 50   - Requires considerable assistance and frequent medical care. 66   - Disabled; requires special care and assistance. 30   - Severely disabled; hospital admission is indicated although death not imminent. 106   - Very sick; hospital admission necessary; active supportive treatment necessary. 10   - Moribund; fatal processes progressing rapidly. 0     - Dead  Karnofsky DA, Abelmann [place], Craver LS and Burchenal [facility] [phone]) The use of the nitrogen mustards in the palliative treatment of carcinoma: with particular reference to bronchogenic carcinoma Cancer 1 634-56   LABORATORY DATA:  Lab Results  Component Value Date   WBC 5.4 04/01/2016   HGB 13.7 04/01/2016   HCT 41.7 04/01/2016   MCV 63.3 (L) 04/01/2016   PLT 272 04/01/2016   Lab Results  Component Value Date   NA 138 11/27/2015   K 4.5 11/27/2015   CL 102 11/27/2015   CO2 23 11/27/2015   Lab Results  Component Value Date   ALT 27 11/26/2015   AST 31 11/26/2015   ALKPHOS 54 11/26/2015   BILITOT 0.7 11/26/2015     RADIOGRAPHY: No results found.    IMPRESSION: This gentleman is a 60 yo gentleman with stage T1c adenocarcinoma of the prostate with a Gleason's score of 3+3 and a PSA of 6.48.  His T-Stage, Gleason's Score, and PSA put him into the favorable risk group.  Accordingly he is eligible for a variety of potential treatment options including active surveillance, prostatectomy, IMRT and prostate  seed implant.  PLAN:Today I reviewed the findings and workup thus far.  We discussed the natural history of prostate cancer.  We reviewed the the implications of T-stage, Gleason's Score, and PSA on decision-making and outcomes in prostate cancer.  We discussed radiation treatment in the management of prostate cancer with regard to the logistics and delivery of external beam radiation treatment as well as the logistics and delivery of prostate brachytherapy.  We compared and contrasted each of these approaches and also compared these against prostatectomy.  The patient expressed interest in prostate brachytherapy.  I filled out a patient counseling form for him with relevant treatment diagrams and we retained a copy for our records.   The patient would like to proceed with prostate brachytherapy.  I will share my findings with Dr. Jeffie Ray and move forward with scheduling the procedure in the  near future.     I enjoyed meeting with him today, and will look forward to participating in the care of this very nice gentleman.   I spent time face to face with the patient and more than 50% of that time was spent in counseling and/or coordination of care.   ------------------------------------------------  Sheral Apley. Tammi Klippel, M.D.  This document serves as a record of services personally performed by Tyler Pita, MD. It was created on his behalf by Bethann Humble, a trained medical scribe. The creation of this record is based on the scribe's personal observations and the provider's statements to them. This document has been checked and approved by the attending provider.

## 2016-08-13 ENCOUNTER — Telehealth: Payer: Self-pay | Admitting: *Deleted

## 2016-08-13 NOTE — Telephone Encounter (Signed)
CALLED PATIENT TO INFORM OF PRE-SEED APPT. FOR 08-21-16, SPOKE WITH PATIENT AND HE IS AWARE OF THIS APPT.

## 2016-08-17 ENCOUNTER — Other Ambulatory Visit: Payer: Self-pay | Admitting: Urology

## 2016-08-18 ENCOUNTER — Telehealth: Payer: Self-pay | Admitting: *Deleted

## 2016-08-18 NOTE — Telephone Encounter (Signed)
CALLED PATIENT TO INFORM OF IMPLANT DATE, SPOKE WITH PATIENT AND HE IS AWARE OF HIS IMPLANT DATE

## 2016-08-20 ENCOUNTER — Telehealth: Payer: Self-pay | Admitting: *Deleted

## 2016-08-20 NOTE — Progress Notes (Signed)
  Radiation Oncology         [phone] ________________________________  Name: Bradley Ray MRN: [id number]  Date: 08/21/2016  DOB: [birth year]  SIMULATION AND TREATMENT PLANNING NOTE PUBIC ARCH STUDY  [id number] A Avbuere, MD  Nolene Ebbs, MD  DIAGNOSIS: 60 y.o. gentleman with stage T1c adenocarcinoma of the prostate with a Gleason's score of 3+3 and a PSA of 6.48     ICD-9-CM ICD-10-CM   1. 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 He was brought to the radiation planning suite and placed supine on the CT couch. A 3-dimensional image study set was obtained in upload to the planning computer. There, on each axial slice, I contoured the prostate gland. Then, using three-dimensional radiation planning tools I reconstructed the prostate in view of the structures from the transperineal needle pathway to assess for possible pubic arch interference. In doing so, I did not appreciate any pubic arch interference. Also, the patient's prostate volume was estimated based on the drawn structure. The volume was 46 cc.  Given the pubic arch appearance and prostate volume, patient remains a good candidate to proceed with prostate seed implant. Today, he freely provided informed written consent to proceed.    PLAN: The patient will undergo prostate seed implant.   ________________________________  Sheral Apley. Tammi Klippel, M.D.

## 2016-08-20 NOTE — Telephone Encounter (Signed)
Called patient to remind of appts. for 08-21-16, spoke with patient and he is aware of these appts.

## 2016-08-21 ENCOUNTER — Encounter (HOSPITAL_BASED_OUTPATIENT_CLINIC_OR_DEPARTMENT_OTHER)
Admission: RE | Admit: 2016-08-21 | Discharge: 2016-08-21 | Disposition: A | Discharge status: Home / Self Care | Payer: Medicaid Other | Source: Ambulatory Visit | Attending: Urology | Admitting: Urology

## 2016-08-21 ENCOUNTER — Ambulatory Visit
Admission: RE | Admit: 2016-08-21 | Discharge: 2016-08-21 | Disposition: A | Discharge status: Home / Self Care | Payer: Medicaid Other | Source: Ambulatory Visit | Attending: Radiation Oncology | Admitting: Radiation Oncology

## 2016-08-21 ENCOUNTER — Ambulatory Visit (HOSPITAL_BASED_OUTPATIENT_CLINIC_OR_DEPARTMENT_OTHER)
Admission: RE | Admit: 2016-08-21 | Discharge: 2016-08-21 | Disposition: A | Discharge status: Home / Self Care | Payer: Medicaid Other | Source: Ambulatory Visit | Attending: Urology | Admitting: Urology

## 2016-08-21 DIAGNOSIS — C61 Malignant neoplasm of prostate: Secondary | ICD-10-CM | POA: Diagnosis present

## 2016-08-21 DIAGNOSIS — C7949 Secondary malignant neoplasm of other parts of nervous system: Secondary | ICD-10-CM

## 2016-08-21 NOTE — Progress Notes (Addendum)
Radiation Oncology         [phone] ________________________________  Initial Outpatient Consultation  Name: Bradley Ray MRN: [id number]  Date: 08/21/2016  DOB: 1956/02/13  [id number] A Bradley Cooks, MD  Bradley Ebbs, MD   REFERRING PHYSICIAN: Nolene Ebbs, MD  DIAGNOSIS: 60 y.o. gentleman with stage T1c adenocarcinoma of the prostate with a Gleason's score of 3+3 and a PSA of 6.48    ICD-9-CM ICD-10-CM   1. Malignant neoplasm of prostate (Bradley Ray) Bradley Ray is a 60 y.o. gentleman.  He was noted to have an elevated PSA of 6.48 at a routine follow up with [facility].  Accordingly, he was referred for evaluation in urology by Dr. Jeffie Pollock on 04/15/16,  digital rectal examination was performed at that time revealing no nodularity and a normal sized prostate.  The patient proceeded to transrectal ultrasound with 12 biopsies of the prostate on 07/02/16.  The prostate volume measured 48.44 cc.  Out of 12 core biopsies,4 were positive.  The maximum Gleason score was 6, and this was seen in the left mid, left apex, right mid lateral, and right apex lateral.  The patient reviewed the biopsy results with his urologist and he was seen on 08/10/16 to discuss options for his treatment. He has elected to proceed with radioactive seed implant. He comes today for pre-seed simulation to confirm gland size and to make sure his anatomy remains appropriate for seed placement. He is tentatively scheduled for this procedure 10/16/15. Of note his pre-seed IPSS was 16 indicating moderate symptoms.    PREVIOUS RADIATION THERAPY: No  PAST MEDICAL HISTORY: Past Medical History:  Diagnosis Date  . Hypertension   . Prostate cancer ([place])      PAST SURGICAL HISTORY: Past Surgical History:  Procedure Laterality Date  . PROSTATE BIOPSY    . REPAIR EXTENSOR TENDON Right 06/02/2016   Procedure: RIGHT LONG FINGER EXTENSOR TENDON REPAIR;  Surgeon: Milly Jakob, MD;   Location: [place];  Service: Orthopedics;  Laterality: Right;  . TOTAL KNEE ARTHROPLASTY      FAMILY HISTORY: . Family History  Problem Relation Age of Onset  . Arrhythmia Mother     Has pacer  . Throat cancer Father   . Pancreatic cancer Sister   . Clotting disorder Sister     related knee surgeries  . Cancer Sister     pancreatic  . Sickle cell trait Son   . Cancer Maternal Aunt   . Cancer Maternal Uncle   . Cancer Other     SOCIAL HISTORY:  reports that he quit smoking about 10 years ago. His smoking use included Cigarettes. He has a 10.00 pack-year smoking history. He has never used smokeless tobacco. He reports that he drinks about 1.8 oz of alcohol per week . He reports that he uses drugs, including "Crack" cocaine, about 1 time per week.  ALLERGIES: Morphine and related  MEDICATIONS:  Current Outpatient Prescriptions  Medication Sig Dispense Refill  . amLODipine (NORVASC) 10 MG tablet Take 1 tablet (10 mg total) by mouth daily. 30 tablet 1  . aspirin 81 MG tablet Take 81 mg by mouth daily.    Bradley Ray atorvastatin (LIPITOR) 40 MG tablet Take 1 tablet (40 mg total) by mouth daily. 30 tablet 1  . hydrochlorothiazide (MICROZIDE) 12.5 MG capsule Take 12.5 mg by mouth daily.     No current facility-administered medications for this encounter.     REVIEW OF  SYSTEMS: The patient reports he is doing well and denies any concerns with bowel or bladder activity at this time. No other complaints are verbalized.  PHYSICAL EXAM:  In general this is a well appearing African American male in no acute distress. He's alert and oriented x4 and appropriate throughout the examination. Cardiopulmonary assessment is negative for acute distress and he exhibits normal effort.    KPS = 100  100 - Normal; no complaints; no evidence of disease. 90   - Able to carry on normal activity; minor signs or symptoms of disease. 80   - Normal activity with effort; some signs or symptoms of  disease. 10   - Cares for self; unable to carry on normal activity or to do active work. 60   - Requires occasional assistance, but is able to care for most of his personal needs. 50   - Requires considerable assistance and frequent medical care. 52   - Disabled; requires special care and assistance. 12   - Severely disabled; hospital admission is indicated although death not imminent. 4   - Very sick; hospital admission necessary; active supportive treatment necessary. 10   - Moribund; fatal processes progressing rapidly. 0     - Dead  Karnofsky DA, Abelmann [place], Craver LS and Burchenal [facility] [phone]) The use of the nitrogen mustards in the palliative treatment of carcinoma: with particular reference to bronchogenic carcinoma Cancer 1 634-56   LABORATORY DATA:  Lab Results  Component Value Date   WBC 5.4 04/01/2016   HGB 13.7 04/01/2016   HCT 41.7 04/01/2016   MCV 63.3 (L) 04/01/2016   PLT 272 04/01/2016   Lab Results  Component Value Date   NA 138 11/27/2015   K 4.5 11/27/2015   CL 102 11/27/2015   CO2 23 11/27/2015   Lab Results  Component Value Date   ALT 27 11/26/2015   AST 31 11/26/2015   ALKPHOS 54 11/26/2015   BILITOT 0.7 11/26/2015     RADIOGRAPHY: Dg Chest 2 View  Result Date: 08/21/2016 CLINICAL DATA:  Preop for seed implant prostate cancer EXAM: CHEST  2 VIEW COMPARISON:  None. FINDINGS: Cardiomediastinal silhouette is unremarkable. Mild elevation of the right hemidiaphragm. No infiltrate or pleural effusion. No pulmonary edema. Mild degenerative changes lower thoracic spine. IMPRESSION: No active cardiopulmonary disease. Electronically Signed   By: Lahoma Crocker M.D.   On: 08/21/2016 14:11      IMPRESSION:  1. 60 y.o. Bradley Ray is a 60 yo gentleman with stage T1c adenocarcinoma of the prostate with a Gleason's score of 3+3 and a PSA of 6.48.  Again we discussed the role of radiotherapy to treat the patient's cancer, and specifically we reviewed the imaging of his  contours from his pre-seed simulation today. His gland size is 46 cc, and his skeletal pelvic anatomy is appropriate to proceed. We then reviewed Dr. Johny Shears recommendations for radioactive seed implant. The patient is interested in moving forward, and we have discussed risks, benefits, short, and long term effects, as well as perioperative expectations. He has signed written consent and will call with any questions or concerns prior to his procedure.  In a visit lasting 25 minutes, greater than 50% of the time was spent face to face discussing perioperative expectations, and coordinating the patient's care.    Carola Rhine, PAC

## 2016-08-31 ENCOUNTER — Encounter: Payer: Self-pay | Admitting: Occupational Therapy

## 2016-08-31 NOTE — Therapy (Signed)
[facility] [address]  [place], [place], [address] Phone: [phone]   Fax:  [phone]  Patient Details  Name: Bradley Ray MRN: [id number] Date of Birth: Mar 25, 1956 Referring Provider:  Dr. Milly Jakob Encounter Date: 08/31/2016   OCCUPATIONAL THERAPY DISCHARGE SUMMARY  Visits from Start of Care: 3  Current functional level related to goals / functional outcomes:     OT Short Term Goals - 07/14/16 1011      OT SHORT TERM GOAL #1   Title Independent with splint wear and care   Baseline issued, may need adjustments   Time 4   Period Weeks   Status Achieved     OT SHORT TERM GOAL #2   Title Independent with initial HEP for A/ROM   Baseline Dependent - unable to begin until 4 weeks post-op   Time 4   Period Weeks   Status Achieved     Pt met no LTG's d/t not returning for last visit to begin strengthening and assess goals.    Remaining deficits: Unknown secondary to not returning   Education / Equipment: Splint wear and care, A/ROM and P/ROM HEP   Plan: Patient agrees to discharge.  Patient goals were partially met. Patient is being discharged due to not returning since the last visit.  on 07/14/16. ????       Carey Bullocks, OTR/L 08/31/2016, 10:03 AM  [facility] [address]  [place], [place], [address] Phone: [phone]   Fax:  [phone]

## 2016-10-07 ENCOUNTER — Telehealth: Payer: Self-pay | Admitting: *Deleted

## 2016-10-07 ENCOUNTER — Encounter (HOSPITAL_BASED_OUTPATIENT_CLINIC_OR_DEPARTMENT_OTHER): Payer: Self-pay | Admitting: *Deleted

## 2016-10-07 NOTE — Telephone Encounter (Signed)
Called patient to remind of labs for 10-08-16 for implant on 10-15-16, lvm for a return call

## 2016-10-08 ENCOUNTER — Encounter (HOSPITAL_BASED_OUTPATIENT_CLINIC_OR_DEPARTMENT_OTHER): Payer: Self-pay | Admitting: *Deleted

## 2016-10-08 LAB — CBC
HEMATOCRIT: 40.2 % (ref 39.0–52.0)
HEMOGLOBIN: 13.2 g/dL (ref 13.0–17.0)
MCH: 20.6 pg — AB (ref 26.0–34.0)
MCHC: 32.8 g/dL (ref 30.0–36.0)
MCV: 62.6 fL — AB (ref 78.0–100.0)
Platelets: 215 10*3/uL (ref 150–400)
RBC: 6.42 MIL/uL — ABNORMAL HIGH (ref 4.22–5.81)
RDW: 16.5 % — AB (ref 11.5–15.5)
WBC: 5.2 10*3/uL (ref 4.0–10.5)

## 2016-10-08 LAB — COMPREHENSIVE METABOLIC PANEL
ALBUMIN: 4.2 g/dL (ref 3.5–5.0)
ALK PHOS: 58 U/L (ref 38–126)
ALT: 39 U/L (ref 17–63)
ANION GAP: 10 (ref 5–15)
AST: 47 U/L — ABNORMAL HIGH (ref 15–41)
BILIRUBIN TOTAL: 0.9 mg/dL (ref 0.3–1.2)
BUN: 14 mg/dL (ref 6–20)
CALCIUM: 9 mg/dL (ref 8.9–10.3)
CO2: 25 mmol/L (ref 22–32)
Chloride: 105 mmol/L (ref 101–111)
Creatinine, Ser: 0.8 mg/dL (ref 0.61–1.24)
GLUCOSE: 104 mg/dL — AB (ref 65–99)
POTASSIUM: 3 mmol/L — AB (ref 3.5–5.1)
Sodium: 140 mmol/L (ref 135–145)
TOTAL PROTEIN: 7.1 g/dL (ref 6.5–8.1)

## 2016-10-08 LAB — APTT: APTT: 26 s (ref 24–36)

## 2016-10-08 LAB — PROTIME-INR
INR: 0.91
Prothrombin Time: 12.2 seconds (ref 11.4–15.2)

## 2016-10-08 NOTE — Progress Notes (Addendum)
NPO AFTER MN.  ARRIVE AT 0600.  CURRENT LAB RESULTS, CXR, AND EKG IN CHART AND EPIC.  WILL TAKE LIPITOR AND NORVASC AM DOS W/ SIPS OF WATER AND DO FLEET ENEMA.  NOTED ABNORMAL LAB RESULTS AND LEFT MESSAGE VIA PHONE FOR PAM, OR SCHEDULER, FOR DR [facility] FOR FURTHER ORDERS.    ADDENDUM:  RECEIVED CALL BACK FROM DR [facility] OFFICE,  STATED POTASSIUM IS LOW BUT OK BUT TO REVIEW W/ ANESTHESIA AND PT TO CONTINUE ASA.  ADDENDUM:  REVIEWED CHART W/ DR Iona Beard ROSE MDA,  STATED DUE TO PT BEING COCAINE USER , PT WILL NEED TO BE DONE IN MAIN OR SETTING.  CALLED AND LM VIA PHONE FOR PAM , OR SCHEDULER, ABOUT MOVING PT AND TO CALL IF ANY QUESTIONS.

## 2016-10-09 ENCOUNTER — Ambulatory Visit: Admission: RE | Admit: 2016-10-09 | Payer: Medicaid Other | Source: Ambulatory Visit

## 2016-10-13 ENCOUNTER — Encounter (HOSPITAL_COMMUNITY): Payer: Self-pay | Admitting: *Deleted

## 2016-10-14 ENCOUNTER — Telehealth: Payer: Self-pay | Admitting: *Deleted

## 2016-10-14 NOTE — Anesthesia Preprocedure Evaluation (Signed)
Anesthesia Evaluation  Patient identified by MRN, date of birth, ID band Patient awake    Reviewed: Allergy & Precautions, NPO status , Patient's Chart, lab work & pertinent test results  Airway Mallampati: II  TM Distance: >3 FB Neck ROM: Full    Dental no notable dental hx.    Pulmonary neg pulmonary ROS, former smoker,    Pulmonary exam normal breath sounds clear to auscultation       Cardiovascular hypertension, Normal cardiovascular exam Rhythm:Regular Rate:Normal  Increased risk for periop MI and/or CVA because of cocaine use   Neuro/Psych negative neurological ROS  negative psych ROS   GI/Hepatic negative GI ROS, (+)     substance abuse  alcohol use and cocaine use, Hepatitis -  Endo/Other  negative endocrine ROS  Renal/GU negative Renal ROS  negative genitourinary   Musculoskeletal negative musculoskeletal ROS (+)   Abdominal   Peds negative pediatric ROS (+)  Hematology negative hematology ROS (+)   Anesthesia Other Findings   Reproductive/Obstetrics negative OB ROS                             Anesthesia Physical Anesthesia Plan  ASA: III  Anesthesia Plan: General   Post-op Pain Management:    Induction: Intravenous  Airway Management Planned: LMA  Additional Equipment:   Intra-op Plan:   Post-operative Plan: Extubation in OR  Informed Consent: I have reviewed the patients History and Physical, chart, labs and discussed the procedure including the risks, benefits and alternatives for the proposed anesthesia with the patient or authorized representative who has indicated his/her understanding and acceptance.   Dental advisory given  Plan Discussed with: CRNA and Surgeon  Anesthesia Plan Comments:         Anesthesia Quick Evaluation

## 2016-10-14 NOTE — H&P (Signed)
CC/HPI: [name] will be undergoing brachytherapy with placement of radioactive seeds next month with Dr Jeffie Pollock. Since last evaluation, the patient has not had any exacerbation of lower urinary tract symptoms.  Denies any other unexplained health problems. No recent fevers or infections. He states feeling well and is eager to proceed with the planned procedure to treat prostate cancer.     ALLERGIES: Morphine Sulfate - Skin Rash    MEDICATIONS: Aspirin 81 mg tablet, chewable 1 tablet PO Daily  Hydrochlorothiazide 12.5 mg capsule 1 capsule PO Daily  Levaquin 500 mg tablet 1 tablet morning before procedure, 1 tablet morning of procedure, 1 tablet morning after procedure  Amlodipine Besylate 10 mg tablet 1 tablet PO Daily  Atorvastatin Calcium 40 mg tablet 1 tablet PO Daily     GU PSH: Complex Uroflow - 07/02/2016 Prostate Needle Biopsy - 07/02/2016      PSH Notes: left ankle surgery 6 screws 2 metal rods placed   NON-GU PSH: Surgical Pathology, Gross And Microscopic Examination For Prostate Needle - 07/02/2016 Total Knee Replacement, Right    GU PMH: Prostate Cancer, T1c Nx Mx Gleason 6 disease. - 07/29/2016 Elevated PSA - 07/02/2016, He has an elevated PSA with a benign exam. This was his first PSA. , - 04/15/2016 Atrophy of testis, He has moderate left testicular atrophy. - 04/15/2016 Bladder-neck obstruction, He has obstructive and irritative voiding symptoms. - 04/15/2016    NON-GU PMH: Arthritis Encounter for general adult medical examination without abnormal findings, Encounter for preventive health examination Hypercholesterolemia Hypertension    FAMILY HISTORY: Hypertension - Mother Pacemaker Placement - Mother   SOCIAL HISTORY: Marital Status: Single Current Smoking Status: Patient does not smoke anymore.  Does drink.  Drinks 1 caffeinated drink per day. Patient's occupation is/was disabled.     Notes: 2 sons 1 daughter    REVIEW OF SYSTEMS:    GU Review Male:   Patient  reports frequent urination, get up at night to urinate, and trouble starting your stream. Patient denies hard to postpone urination, burning/ pain with urination, leakage of urine, stream starts and stops, have to strain to urinate , erection problems, and penile pain.  Gastrointestinal (Upper):   Patient denies nausea, vomiting, and indigestion/ heartburn.  Gastrointestinal (Lower):   Patient denies diarrhea and constipation.  Constitutional:   Patient reports night sweats. Patient denies fever, weight loss, and fatigue.  Skin:   Patient denies skin rash/ lesion and itching.  Eyes:   Patient denies blurred vision and double vision.  Ears/ Nose/ Throat:   Patient denies sore throat and sinus problems.  Hematologic/Lymphatic:   Patient denies swollen glands and easy bruising.  Cardiovascular:   Patient denies leg swelling and chest pains.  Respiratory:   Patient denies cough and shortness of breath.  Endocrine:   Patient denies excessive thirst.  Musculoskeletal:   Patient reports back pain and joint pain.   Neurological:   Patient denies headaches and dizziness.  Psychologic:   Patient denies depression and anxiety.   VITAL SIGNS:      10/02/2016 11:15 AM  Weight 178 lb / 80.74 kg  Height 65 in / 165.1 cm  BP 136/88 mmHg  Pulse 89 /min  Temperature 97.7 F / 36 C  BMI 29.6 kg/m   GU PHYSICAL EXAMINATION:    Anus and Perineum: No hemorrhoids. No anal stenosis. No rectal fissure, no anal fissure. No edema, no dimple, no perineal tenderness, no anal tenderness.  Scrotum: No lesions. No edema. No cysts. No  warts.  Epididymides: Right: no spermatocele, no masses, no cysts, no tenderness, no induration, no enlargement. Left: no spermatocele, no masses, no cysts, no tenderness, no induration, no enlargement.  Testes: Atrophic left testis. No tenderness, no swelling, no enlargement left testis. No tenderness, no swelling, no enlargement right testis. Normal location left testis. Normal location  right testis. No mass, no cyst, no varicocele, no hydrocele left testis. No mass, no cyst, no varicocele, no hydrocele right testis.   Urethral Meatus: Normal size. No lesion, no wart, no discharge, no polyp. Normal location.  Penis: Penis uncircumcised. No foreskin warts, no cracks. No dorsal peyronie's plaques, no left corporal peyronie's plaques, no right corporal peyronie's plaques, no scarring, no shaft warts. No balanitis, no meatal stenosis.   Prostate: Prostate 2 + size. Left lobe normal consistency, right lobe normal consistency. Symmetrical lobes. No prostate nodule. Left lobe no tenderness, right lobe no tenderness.   Seminal Vesicles: Nonpalpable.  Sphincter Tone: Normal sphincter. No rectal tenderness. No rectal mass.    MULTI-SYSTEM PHYSICAL EXAMINATION:    Constitutional: Well-nourished. No physical deformities. Normally developed. Good grooming.  Neck: Neck symmetrical, not swollen. Normal tracheal position.  Respiratory: No labored breathing, no use of accessory muscles. CTA.  Cardiovascular: Normal temperature, normal extremity pulses, no swelling, no varicosities. S1/S2. RRR.  Lymphatic: No enlargement of neck, axillae, groin.  Skin: No paleness, no jaundice, no cyanosis. No lesion, no ulcer, no rash.  Neurologic / Psychiatric: Oriented to time, oriented to place, oriented to person. No depression, no anxiety, no agitation.  Gastrointestinal: Obese abdomen. No mass, no tenderness, no rigidity.   Musculoskeletal: Spine, ribs, pelvis no bilateral tenderness. Normal gait and station of head and neck.     PAST DATA REVIEWED:  Source Of History:  Patient  Lab Test Review:   PSA  Records Review:   AUA Symptom Score, Pathology Reports, Previous Patient Records  Urine Test Review:   Urinalysis   05/18/16 04/15/16 12/13/15  PSA  Total PSA 3.87  3.69 ng/dl 6.48 ng/dl  Free PSA  0.93 ng/dl   % Free PSA  25 %     04/15/16  Hormones  Testosterone, Total 274.9 pg/dL    10/02/16   Urinalysis  Urine Appearance Clear   Urine Color Yellow   Urine Glucose Neg   Urine Bilirubin Neg   Urine Ketones Neg   Urine Specific Gravity 1.025   Urine Blood Neg   Urine pH 5.5   Urine Protein Neg   Urine Urobilinogen 0.2   Urine Nitrites Neg   Urine Leukocyte Esterase Neg    PROCEDURES:          Urinalysis Dipstick Dipstick Cont'd  Color: Yellow Bilirubin: Neg  Appearance: Clear Ketones: Neg  Specific Gravity: 1.025 Blood: Neg  pH: 5.5 Protein: Neg  Glucose: Neg Urobilinogen: 0.2    Nitrites: Neg    Leukocyte Esterase: Neg    ASSESSMENT:      ICD-10 Details  1 GU:   Prostate Cancer - C61    PLAN:           Orders Labs Urine Culture and Sensitivity          Schedule Return Visit/Planned Activity: Keep Scheduled Appointment - Schedule Surgery          Document Letter(s):  Created for Patient: Clinical Summary         Notes:   PE and UA wnl. He is doing well with no changes in voiding symptoms or other s/s of  systemic infection. I see no barriers from My perspective that would prevent him from proceeding with brachytherapy next month.

## 2016-10-14 NOTE — Telephone Encounter (Signed)
Called patient to remind of implant for 10-15-16, spoke with patient and he is aware of this procedure.

## 2016-10-15 ENCOUNTER — Ambulatory Visit (HOSPITAL_COMMUNITY): Payer: Medicaid Other | Admitting: Anesthesiology

## 2016-10-15 ENCOUNTER — Encounter (HOSPITAL_COMMUNITY)
Admission: RE | Disposition: A | Discharge status: Home / Self Care | Payer: Self-pay | Source: Ambulatory Visit | Attending: Urology

## 2016-10-15 ENCOUNTER — Ambulatory Visit (HOSPITAL_COMMUNITY)
Admission: RE | Admit: 2016-10-15 | Discharge: 2016-10-15 | Disposition: A | Discharge status: Home / Self Care | Payer: Medicaid Other | Source: Ambulatory Visit | Attending: Urology | Admitting: Urology

## 2016-10-15 ENCOUNTER — Ambulatory Visit (HOSPITAL_COMMUNITY): Payer: Medicaid Other

## 2016-10-15 ENCOUNTER — Encounter (HOSPITAL_COMMUNITY): Payer: Self-pay | Admitting: *Deleted

## 2016-10-15 DIAGNOSIS — C61 Malignant neoplasm of prostate: Secondary | ICD-10-CM | POA: Insufficient documentation

## 2016-10-15 DIAGNOSIS — Z885 Allergy status to narcotic agent status: Secondary | ICD-10-CM | POA: Insufficient documentation

## 2016-10-15 DIAGNOSIS — E7800 Pure hypercholesterolemia, unspecified: Secondary | ICD-10-CM | POA: Insufficient documentation

## 2016-10-15 DIAGNOSIS — Z96651 Presence of right artificial knee joint: Secondary | ICD-10-CM | POA: Insufficient documentation

## 2016-10-15 DIAGNOSIS — Z7982 Long term (current) use of aspirin: Secondary | ICD-10-CM | POA: Diagnosis not present

## 2016-10-15 DIAGNOSIS — Z8249 Family history of ischemic heart disease and other diseases of the circulatory system: Secondary | ICD-10-CM | POA: Insufficient documentation

## 2016-10-15 DIAGNOSIS — Z87891 Personal history of nicotine dependence: Secondary | ICD-10-CM | POA: Insufficient documentation

## 2016-10-15 DIAGNOSIS — I10 Essential (primary) hypertension: Secondary | ICD-10-CM | POA: Insufficient documentation

## 2016-10-15 HISTORY — DX: Inflammatory liver disease, unspecified: K75.9

## 2016-10-15 HISTORY — PX: CYSTOSCOPY: SHX5120

## 2016-10-15 HISTORY — DX: Alcohol dependence, uncomplicated: F10.20

## 2016-10-15 HISTORY — DX: Benign prostatic hyperplasia with lower urinary tract symptoms: N40.1

## 2016-10-15 HISTORY — DX: Hyperlipidemia, unspecified: E78.5

## 2016-10-15 HISTORY — DX: Male erectile dysfunction, unspecified: N52.9

## 2016-10-15 HISTORY — DX: Benign prostatic hyperplasia with lower urinary tract symptoms: N13.8

## 2016-10-15 HISTORY — DX: Infarction of spleen: D73.5

## 2016-10-15 HISTORY — DX: Cocaine use, unspecified, uncomplicated: F14.90

## 2016-10-15 HISTORY — DX: Thalassemia minor: D56.3

## 2016-10-15 HISTORY — PX: RADIOACTIVE SEED IMPLANT: SHX5150

## 2016-10-15 HISTORY — DX: Presence of dental prosthetic device (complete) (partial): Z97.2

## 2016-10-15 LAB — RAPID URINE DRUG SCREEN, HOSP PERFORMED
Amphetamines: NOT DETECTED
BENZODIAZEPINES: NOT DETECTED
Barbiturates: NOT DETECTED
COCAINE: NOT DETECTED
Opiates: NOT DETECTED
Tetrahydrocannabinol: POSITIVE — AB

## 2016-10-15 SURGERY — INSERTION, RADIATION SOURCE, PROSTATE
Anesthesia: General | Site: Prostate

## 2016-10-15 MED ORDER — MIDAZOLAM HCL 2 MG/2ML IJ SOLN
INTRAMUSCULAR | Status: AC
  Filled 2016-10-15: qty 2

## 2016-10-15 MED ORDER — OXYCODONE HCL 5 MG/5ML PO SOLN: 5.0000 mg | Freq: Once | ORAL | Status: DC | PRN

## 2016-10-15 MED ORDER — LIDOCAINE 2% (20 MG/ML) 5 ML SYRINGE
INTRAMUSCULAR | Status: DC | PRN
  Administered 2016-10-15: 75 mg via INTRAVENOUS

## 2016-10-15 MED ORDER — CIPROFLOXACIN IN D5W 400 MG/200ML IV SOLN
INTRAVENOUS | Status: AC
  Filled 2016-10-15: qty 200

## 2016-10-15 MED ORDER — MIDAZOLAM HCL 5 MG/5ML IJ SOLN
INTRAMUSCULAR | Status: DC | PRN
  Administered 2016-10-15: 2 mg via INTRAVENOUS

## 2016-10-15 MED ORDER — IOHEXOL 300 MG/ML  SOLN
INTRAMUSCULAR | Status: DC | PRN
  Administered 2016-10-15: 7 mL

## 2016-10-15 MED ORDER — PROPOFOL 10 MG/ML IV BOLUS
INTRAVENOUS | Status: DC | PRN
  Administered 2016-10-15: 200 mg via INTRAVENOUS

## 2016-10-15 MED ORDER — ONDANSETRON HCL 4 MG/2ML IJ SOLN
INTRAMUSCULAR | Status: AC
  Filled 2016-10-15: qty 2

## 2016-10-15 MED ORDER — SODIUM CHLORIDE 0.9 % IR SOLN
Status: DC | PRN
  Administered 2016-10-15: 1000 mL

## 2016-10-15 MED ORDER — ONDANSETRON HCL 4 MG/2ML IJ SOLN
INTRAMUSCULAR | Status: DC | PRN
  Administered 2016-10-15: 4 mg via INTRAVENOUS

## 2016-10-15 MED ORDER — LIDOCAINE 2% (20 MG/ML) 5 ML SYRINGE
INTRAMUSCULAR | Status: AC
  Filled 2016-10-15: qty 5

## 2016-10-15 MED ORDER — PROMETHAZINE HCL 25 MG/ML IJ SOLN: 6.2500 mg | INTRAMUSCULAR | Status: DC | PRN

## 2016-10-15 MED ORDER — FENTANYL CITRATE (PF) 100 MCG/2ML IJ SOLN
INTRAMUSCULAR | Status: AC
  Filled 2016-10-15: qty 2

## 2016-10-15 MED ORDER — HYDROCODONE-ACETAMINOPHEN 5-325 MG PO TABS: 1.0000 | ORAL_TABLET | Freq: Four times a day (QID) | ORAL | 0 refills | Status: DC | PRN

## 2016-10-15 MED ORDER — LACTATED RINGERS IV SOLN
INTRAVENOUS | Status: DC
  Administered 2016-10-15: 07:00:00 via INTRAVENOUS

## 2016-10-15 MED ORDER — CIPROFLOXACIN HCL 500 MG PO TABS: 500.0000 mg | ORAL_TABLET | Freq: Two times a day (BID) | ORAL | 0 refills | Status: DC

## 2016-10-15 MED ORDER — FLEET ENEMA 7-19 GM/118ML RE ENEM: 1.0000 | ENEMA | Freq: Once | RECTAL | Status: DC

## 2016-10-15 MED ORDER — PROPOFOL 10 MG/ML IV BOLUS
INTRAVENOUS | Status: AC
  Filled 2016-10-15: qty 20

## 2016-10-15 MED ORDER — KETOROLAC TROMETHAMINE 30 MG/ML IJ SOLN
30.0000 mg | Freq: Once | INTRAMUSCULAR | Status: AC | PRN
  Administered 2016-10-15: 30 mg via INTRAVENOUS
  Filled 2016-10-15: qty 1

## 2016-10-15 MED ORDER — FENTANYL CITRATE (PF) 100 MCG/2ML IJ SOLN
25.0000 ug | INTRAMUSCULAR | Status: DC | PRN
  Administered 2016-10-15 (×2): 50 ug via INTRAVENOUS

## 2016-10-15 MED ORDER — CIPROFLOXACIN IN D5W 400 MG/200ML IV SOLN
400.0000 mg | INTRAVENOUS | Status: AC
  Administered 2016-10-15: 400 mg via INTRAVENOUS

## 2016-10-15 MED ORDER — FENTANYL CITRATE (PF) 100 MCG/2ML IJ SOLN
INTRAMUSCULAR | Status: DC | PRN
  Administered 2016-10-15: 50 ug via INTRAVENOUS
  Administered 2016-10-15 (×2): 25 ug via INTRAVENOUS
  Administered 2016-10-15 (×2): 50 ug via INTRAVENOUS

## 2016-10-15 MED ORDER — OXYCODONE HCL 5 MG PO TABS: 5.0000 mg | ORAL_TABLET | Freq: Once | ORAL | Status: DC | PRN

## 2016-10-15 MED ORDER — ARTIFICIAL TEARS OP OINT
TOPICAL_OINTMENT | OPHTHALMIC | Status: AC
  Filled 2016-10-15: qty 3.5

## 2016-10-15 SURGICAL SUPPLY — 24 items
BAG URINE DRAINAGE (UROLOGICAL SUPPLIES) ×3 IMPLANT
BLADE CLIPPER SURG (BLADE) ×3 IMPLANT
CATH FOLEY 2WAY SLVR  5CC 16FR (CATHETERS) ×1
CATH FOLEY 2WAY SLVR 5CC 16FR (CATHETERS) ×2 IMPLANT
CATH ROBINSON RED A/P 20FR (CATHETERS) ×3 IMPLANT
DRAPE SURG IRRIG POUCH 19X23 (DRAPES) ×3 IMPLANT
DRSG TEGADERM 4X4.75 (GAUZE/BANDAGES/DRESSINGS) ×3 IMPLANT
DRSG TEGADERM 8X12 (GAUZE/BANDAGES/DRESSINGS) ×3 IMPLANT
GAUZE SPONGE 4X4 12PLY STRL (GAUZE/BANDAGES/DRESSINGS) IMPLANT
GLOVE BIOGEL PI IND STRL 8 (GLOVE) ×10 IMPLANT
GLOVE BIOGEL PI INDICATOR 8 (GLOVE) ×5
GLOVE SURG SS PI 8.0 STRL IVOR (GLOVE) IMPLANT
GOWN STRL REUS W/TWL LRG LVL3 (GOWN DISPOSABLE) ×3 IMPLANT
GOWN STRL REUS W/TWL XL LVL3 (GOWN DISPOSABLE) ×3 IMPLANT
HOLDER FOLEY CATH W/STRAP (MISCELLANEOUS) ×3 IMPLANT
IV NS 1000ML (IV SOLUTION) ×1
IV NS 1000ML BAXH (IV SOLUTION) ×2 IMPLANT
MARKER SKIN DUAL TIP RULER LAB (MISCELLANEOUS) ×3 IMPLANT
PACK CYSTO (CUSTOM PROCEDURE TRAY) ×3 IMPLANT
STRIP CLOSURE SKIN 1/2X4 (GAUZE/BANDAGES/DRESSINGS) ×3 IMPLANT
SYR 10ML LL (SYRINGE) ×3 IMPLANT
TOWEL OR 17X26 10 PK STRL BLUE (TOWEL DISPOSABLE) ×3 IMPLANT
UNDERPAD 30X30 INCONTINENT (UNDERPADS AND DIAPERS) ×3 IMPLANT
WATER STERILE IRR 1000ML UROMA (IV SOLUTION) ×3 IMPLANT

## 2016-10-15 NOTE — Op Note (Signed)
PATIENT:  Bradley Ray  PRE-OPERATIVE DIAGNOSIS:  Adenocarcinoma of the prostate  POST-OPERATIVE DIAGNOSIS:  Same  PROCEDURE:  Procedure(s): 1. I-125 radioactive seed implantation 2. Cystoscopy  SURGEON:  Surgeon(s): [name] MD  Radiation oncologist: Dr. Tyler Pita  ANESTHESIA:  General  EBL:  Minimal  DRAINS: 31 French Foley catheter  INDICATION: [name] is a 61 y.o. with Stage T1c Nx Mx, Gleason 6 prostate cancer who has elected brachytherapy for treatment.  Description of procedure: After informed consent the patient was brought to the major OR, placed on the table and administered general anesthesia. He was then moved to the modified lithotomy position with his perineum perpendicular to the floor. His perineum and genitalia were then sterilely prepped. An official timeout was then performed. A 16 French Foley catheter was then placed in the bladder and filled with dilute contrast, a rectal tube was placed in the rectum and the transrectal ultrasound probe was placed in the rectum and affixed to the stand. He was then sterilely draped.  The sterile grid was installed.   Anchor needles were then placed.   Real time ultrasonography was used along with the seed planning software spot-pro version 3.1-00. This was used to develop the seed plan including the number of needles as well as number of seeds required for complete and adequate coverage. Real-time ultrasonography was then used along with the previously developed plan and the Nucletron device to implant a total of 65 seeds using 22 needles for a target dose of 145 Gy. This proceeded without difficulty or complication.  A Foley catheter was then removed as well as the transrectal ultrasound probe and rectal probe. Flexible cystoscopy was then performed using the 17 French flexible scope which revealed a normal urethra throughout its length down to the sphincter which appeared intact.  There was a small clot in the  urethra.  The prostatic urethra was 3cm with bilobar hyperplasia with mild obstruction. The bladder was then entered and fully and systematically inspected.  The ureteral orifices were noted to be of normal configuration and position. The mucosa revealed no evidence of tumors. There were also no stones identified within the bladder.  no seeds or spacers were seen and/or removed from the bladder.  The cystoscope was then removed.  The drapes were removed.  The perineum was cleaned and dressed.  He was taken out of the lithotomy position and was awakened and taken to recovery room in stable and satisfactory condition. He tolerated procedure well and there were no intraoperative complications.

## 2016-10-15 NOTE — Progress Notes (Signed)
Pt states he has not smoked marijuana today. Poor historian with meds, unsure which med he took this am, microzide vs norvasc

## 2016-10-15 NOTE — Anesthesia Procedure Notes (Signed)
Procedure Name: LMA Insertion Date/Time: 10/15/2016 7:56 AM Performed by: Lollie Sails Pre-anesthesia Checklist: Patient identified, Emergency Drugs available, Suction available, Patient being monitored and Timeout performed Patient Re-evaluated:Patient Re-evaluated prior to inductionOxygen Delivery Method: Circle system utilized Preoxygenation: Pre-oxygenation with 100% oxygen Intubation Type: IV induction Ventilation: Mask ventilation without difficulty LMA: LMA inserted LMA Size: 5.0 Number of attempts: 1 Placement Confirmation: positive ETCO2 and breath sounds checked- equal and bilateral Tube secured with: Tape Dental Injury: Teeth and Oropharynx as per pre-operative assessment

## 2016-10-15 NOTE — Interval H&P Note (Signed)
History and Physical Interval Note:  He is doing well without complaints and is ready to proceed with the seed implant for his Gleason 6 prostate cancer.   10/15/2016 7:22 AM  Bradley Ray  has presented today for surgery, with the diagnosis of prostate cancer  The various methods of treatment have been discussed with the patient and family. After consideration of risks, benefits and other options for treatment, the patient has consented to  Procedure(s): RADIOACTIVE SEED IMPLANT/BRACHYTHERAPY IMPLANT (N/A) as a surgical intervention .  The patient's history has been reviewed, patient examined, no change in status, stable for surgery.  I have reviewed the patient's chart and labs.  Questions were answered to the patient's satisfaction.     Scotti Kosta J

## 2016-10-15 NOTE — Anesthesia Postprocedure Evaluation (Addendum)
Anesthesia Post Note  Patient: Bradley Ray  Procedure(s) Performed: Procedure(s) (LRB): RADIOACTIVE SEED IMPLANT/BRACHYTHERAPY IMPLANT (N/A) CYSTOSCOPY FLEXIBLE (N/A)  Patient location during evaluation: PACU Anesthesia Type: General Level of consciousness: awake and alert Pain management: pain level controlled Vital Signs Assessment: post-procedure vital signs reviewed and stable Respiratory status: spontaneous breathing, nonlabored ventilation, respiratory function stable and patient connected to nasal cannula oxygen Cardiovascular status: blood pressure returned to baseline and stable Postop Assessment: no signs of nausea or vomiting Anesthetic complications: no       Last Vitals:  Vitals:   10/15/16 0937 10/15/16 0945  BP:  (!) 145/91  Pulse: 88 88  Resp: 14 16  Temp:      Last Pain:  Vitals:   10/15/16 0945  TempSrc:   PainSc: 7                  Shadow Stiggers S

## 2016-10-15 NOTE — Transfer of Care (Signed)
Immediate Anesthesia Transfer of Care Note  Patient: Bradley Ray  Procedure(s) Performed: Procedure(s) with comments: RADIOACTIVE SEED IMPLANT/BRACHYTHERAPY IMPLANT (N/A) -    104    SEEDS IMPLANTED CYSTOSCOPY FLEXIBLE (N/A) - NO SEEDS FOUND IN BLADDER  Patient Location: PACU  Anesthesia Type:General  Level of Consciousness: awake, sedated and patient cooperative  Airway & Oxygen Therapy: Patient Spontanous Breathing and Patient connected to face mask oxygen  Post-op Assessment: Report given to RN and Post -op Vital signs reviewed and stable  Post vital signs: Reviewed and stable  Last Vitals:  Vitals:   10/15/16 0608  BP: (!) 147/79  Pulse: 91  Resp: 18  Temp: 36.8 C    Last Pain:  Vitals:   10/15/16 0608  TempSrc: Oral  PainSc:       Patients Stated Pain Goal: 4 ([phone] [id number])  Complications: No apparent anesthesia complications

## 2016-10-15 NOTE — Discharge Instructions (Addendum)
General Anesthesia, Adult, Care After These instructions provide you with information about caring for yourself after your procedure. Your health care provider may also give you more specific instructions. Your treatment has been planned according to current medical practices, but problems sometimes occur. Call your health care provider if you have any problems or questions after your procedure. What can I expect after the procedure? After the procedure, it is common to have:  Vomiting.  A sore throat.  Mental slowness. It is common to feel:  Nauseous.  Cold or shivery.  Sleepy.  Tired.  Sore or achy, even in parts of your body where you did not have surgery. Follow these instructions at home: For at least 24 hours after the procedure:  Do not:  Participate in activities where you could fall or become injured.  Drive.  Use heavy machinery.  Drink alcohol.  Take sleeping pills or medicines that cause drowsiness.  Make important decisions or sign legal documents.  Take care of children on your own.  Rest. Eating and drinking  If you vomit, drink water, juice, or soup when you can drink without vomiting.  Drink enough fluid to keep your urine clear or pale yellow.  Make sure you have little or no nausea before eating solid foods.  Follow the diet recommended by your health care provider. General instructions  Have a responsible adult stay with you until you are awake and alert.  Return to your normal activities as told by your health care provider. Ask your health care provider what activities are safe for you.  Take over-the-counter and prescription medicines only as told by your health care provider.  If you smoke, do not smoke without supervision.  Keep all follow-up visits as told by your health care provider. This is important. Contact a health care provider if:  You continue to have nausea or vomiting at home, and medicines are not helpful.  You  cannot drink fluids or start eating again.  You cannot urinate after 8-12 hours.  You develop a skin rash.  You have fever.  You have increasing redness at the site of your procedure. Get help right away if:  You have difficulty breathing.  You have chest pain.  You have unexpected bleeding.  You feel that you are having a life-threatening or urgent problem. This information is not intended to replace advice given to you by your health care provider. Make sure you discuss any questions you have with your health care provider. Document Released: 01/04/2001 Document Revised: 03/02/2016 Document Reviewed: 09/12/2015 Elsevier Interactive Patient Education  2017 [employer] for Prostate Cancer, Care After Refer to this sheet in the next few weeks. These instructions provide you with information on caring for yourself after your procedure. Your health care provider may also give you more specific instructions. Your treatment has been planned according to current medical practices, but problems sometimes occur. Call your health care provider if you have any problems or questions after your procedure. What can I expect after the procedure? The area behind the scrotum will probably be tender and bruised. For a short period of time you may have:  Difficulty passing urine. You may need a catheter for a few days to a month.  Blood in the urine or semen.  A feeling of constipation because of prostate swelling.  Frequent feeling of an urgent need to urinate. For a long period of time you may have:  Inflammation of the rectum. This  happens in about 2% of people who have the procedure.  Erection problems. These vary with age and occur in about 15-40% of men.  Difficulty urinating. This is caused by scarring in the urethra.  Diarrhea. Follow these instructions at home:  Take medicines only as directed by your health care provider.  You will probably have a  catheter in your bladder for several days. You will have blood in the urine bag and should drink a lot of fluids to keep it a light red color.  Keep all follow-up visits as directed by your health care provider. If you have a catheter, it will be removed during one of these visits.  Try not to sit directly on the area behind the scrotum. A soft cushion can decrease the discomfort. Ice packs may also be helpful for the discomfort. Do not put ice directly on the skin.  Shower and wash the area behind the scrotum gently. Do not sit in a tub.  If you have had the brachytherapy that uses the seeds, limit your close contact with children and pregnant women for 2 months because of the radiation still in the prostate. After that period of time, the levels drop off quickly. Get help right away if:  You have a fever.  You have chills.  You have shortness of breath.  You have chest pain.  You have thick blood, like tomato juice, in the urine bag.  Your catheter is blocked so urine cannot get into the bag. Your bladder area or lower abdomen may be swollen.  There is excessive bleeding from your rectum. It is normal to have a little blood mixed with your stool.  There is severe discomfort in the treated area that does not go away with pain medicine.  You have abdominal discomfort.  You have severe nausea or vomiting.  You develop any new or unusual symptoms. This information is not intended to replace advice given to you by your health care provider. Make sure you discuss any questions you have with your health care provider. Document Released: 10/31/2010 Document Revised: 03/11/2016 Document Reviewed: 03/21/2013 Elsevier Interactive Patient Education  2017 [employer].

## 2016-10-15 NOTE — Progress Notes (Signed)
  Radiation Oncology         [phone] ________________________________  Name: Bradley Ray MRN: [id number]  Date: 10/15/2016  DOB: [birth year]       Prostate Seed Implant  [id number] A Avbuere, MD  No ref. provider found  DIAGNOSIS:  61 y.o. gentleman with stage T1c adenocarcinoma of the prostate with a Gleason's score of 3+3 and a PSA of 6.48     ICD-9-CM ICD-10-CM   1. Prostate cancer ([place]) 185 C61 DG Chest 2 View     DG Chest 2 View    PROCEDURE: Insertion of radioactive I-125 seeds into the prostate gland.  RADIATION DOSE: 145 Gy, definitive therapy.  TECHNIQUE: [name] was brought to the operating room with the urologist. He was placed in the dorsolithotomy position. He was catheterized and a rectal tube was inserted. The perineum was shaved, prepped and draped. The ultrasound probe was then introduced into the rectum to see the prostate gland.  TREATMENT DEVICE: A needle grid was attached to the ultrasound probe stand and anchor needles were placed.  3D PLANNING: The prostate was imaged in 3D using a sagittal sweep of the prostate probe. These images were transferred to the planning computer. There, the prostate, urethra and rectum were defined on each axial reconstructed image. Then, the software created an optimized 3D plan and a few seed positions were adjusted. The quality of the plan was reviewed using [facility] information for the target and the following two organs at risk:  Urethra and Rectum.  Then the accepted plan was uploaded to the seed Selectron afterloading unit.  PROSTATE VOLUME STUDY:  Using transrectal ultrasound the volume of the prostate was verified to be 43.79 cc.  SPECIAL TREATMENT PROCEDURE/SUPERVISION AND HANDLING: The Nucletron FIRST system was used to place the needles under sagittal guidance. A total of 22 needles were used to deposit 65 seeds in the prostate gland. The individual seed activity was 0.504 mCi.  COMPLEX SIMULATION: At the end of the  procedure, an anterior radiograph of the pelvis was obtained to document seed positioning and count. Cystoscopy was performed to check the urethra and bladder.  MICRODOSIMETRY: At the end of the procedure, the patient was emitting 0.1 mR/hr at 1 meter. Accordingly, he was considered safe for hospital discharge.  PLAN: The patient will return to the radiation oncology clinic for post implant CT dosimetry in three weeks.   ________________________________  Sheral Apley Tammi Klippel, M.D.

## 2016-10-15 NOTE — Progress Notes (Signed)
Bradley Ray -Certified Therapeutic Physicist- in - scanned area- area cleared by [place].

## 2016-10-15 NOTE — Progress Notes (Signed)
  October 15, 2016  Patient: Bradley Ray  Date of Birth: 1955-11-03  Date of Visit: 08/17/2016    To Whom It May Concern:  Anna Genre was present with her father, Bradley Ray, for his surgery on 10/15/16.  Sincerely,  Gaynelle Cage RN

## 2016-10-16 ENCOUNTER — Encounter (HOSPITAL_COMMUNITY): Payer: Self-pay | Admitting: Urology

## 2016-11-04 ENCOUNTER — Telehealth: Payer: Self-pay | Admitting: *Deleted

## 2016-11-04 NOTE — Telephone Encounter (Signed)
CALLED PATIENT TO REMIND OF POST SEED APPTS. FOR 11-05-16, LVM FOR A RETURN CALL

## 2016-11-05 ENCOUNTER — Ambulatory Visit
Admission: RE | Admit: 2016-11-05 | Discharge: 2016-11-05 | Disposition: A | Discharge status: Home / Self Care | Payer: Medicaid Other | Source: Ambulatory Visit | Attending: Radiation Oncology | Admitting: Radiation Oncology

## 2016-11-05 DIAGNOSIS — Y842 Radiological procedure and radiotherapy as the cause of abnormal reaction of the patient, or of later complication, without mention of misadventure at the time of the procedure: Secondary | ICD-10-CM | POA: Insufficient documentation

## 2016-11-05 DIAGNOSIS — R350 Frequency of micturition: Secondary | ICD-10-CM | POA: Insufficient documentation

## 2016-11-05 DIAGNOSIS — R3911 Hesitancy of micturition: Secondary | ICD-10-CM | POA: Insufficient documentation

## 2016-11-05 DIAGNOSIS — Z7982 Long term (current) use of aspirin: Secondary | ICD-10-CM | POA: Insufficient documentation

## 2016-11-05 DIAGNOSIS — C61 Malignant neoplasm of prostate: Secondary | ICD-10-CM | POA: Diagnosis present

## 2016-11-05 DIAGNOSIS — Z885 Allergy status to narcotic agent status: Secondary | ICD-10-CM | POA: Insufficient documentation

## 2016-11-05 DIAGNOSIS — Z79899 Other long term (current) drug therapy: Secondary | ICD-10-CM | POA: Diagnosis not present

## 2016-11-05 NOTE — Progress Notes (Signed)
Radiation Oncology         [phone]) [phone] ________________________________  Name: AADEN LONARDO MRN: [id number]  Date: 11/05/2016  DOB: 06-13-1956  Follow-Up Visit Note  CC: Philis Fendt, MD  Nolene Ebbs, MD  Diagnosis:  61 y.o. gentleman with stage T1c adenocarcinoma of the prostate with a Gleason's score of 3+3 and a PSA of 6.48  No diagnosis found.  Interval Since Last Radiation: 3 weeks 10/15/16 radioactive seed implant  Narrative:  The patient returns today for routine follow-up.  He reports increased urinary frequency and urinary hesitation symptoms. He filled out a questionnaire regarding urinary function today providing and overall IPSS score of 16 which is stable as compared with his pretreatment IPSS.  He reports mild dysuria that resolves with AZO. He denies hematuria, leakage, or urinary incontinence.  His pre-implant score was 16. He denies any bowel symptoms. He denies significant fatigue.  ALLERGIES:  is allergic to morphine and related.  Meds: Current Outpatient Prescriptions  Medication Sig Dispense Refill  . acetaminophen (TYLENOL) 500 MG tablet Take 1,000 mg by mouth daily as needed for moderate pain or headache.    Marland Kitchen amLODipine (NORVASC) 10 MG tablet Take 1 tablet (10 mg total) by mouth daily. (Patient taking differently: Take 10 mg by mouth every morning. ) 30 tablet 1  . aspirin 81 MG tablet Take 162 mg by mouth daily.     Marland Kitchen atorvastatin (LIPITOR) 40 MG tablet Take 1 tablet (40 mg total) by mouth daily. (Patient taking differently: Take 40 mg by mouth every morning. ) 30 tablet 1  . hydrochlorothiazide (MICROZIDE) 12.5 MG capsule Take 12.5 mg by mouth every morning.     Marland Kitchen HYDROcodone-acetaminophen (NORCO) 5-325 MG tablet Take 1 tablet by mouth every 6 (six) hours as needed for moderate pain. 10 tablet 0   No current facility-administered medications for this encounter.     Physical Findings: The patient is in no acute distress. Patient is alert and  oriented.  oral temperature is 98.2 F (36.8 C). His blood pressure is 115/79 and his pulse is 72. His respiration is 16 and oxygen saturation is 100%. .  No significant changes.  Lab Findings: Lab Results  Component Value Date   WBC 5.2 10/08/2016   HGB 13.2 10/08/2016   HCT 40.2 10/08/2016   MCV 62.6 (L) 10/08/2016   PLT 215 10/08/2016    Radiographic Findings:  Patient underwent CT imaging in our clinic for post implant dosimetry. The CT appears to demonstrate an adequate distribution of radioactive seeds throughout the prostate gland. There no seeds in her near the rectum. I suspect the final radiation plan and dosimetry will show appropriate coverage of the prostate gland.   Impression: T1c adenocarcinoma of the prostate with a Gleason's score of 3+3 and a PSA of 6.48. The patient is recovering from the effects of radiation. His urinary symptoms should gradually improve over the next 4-6 months. We talked about this today. He is encouraged by his improvement already and is otherwise please with his outcome.   Plan: Today, I spent time talking to the patient about his prostate seed implant and resolving urinary symptoms. We also talked about long-term follow-up for prostate cancer following seed implant. He understands that ongoing PSA determinations and digital rectal exams will help perform surveillance to rule out disease recurrence. He understands what to expect with his PSA measures. Patient was also educated today about some of the long-term effects from radiation including a small risk for rectal  bleeding and possibly erectile dysfunction. We talked about some of the general management approaches to these potential complications. However, I did encourage the patient to contact our office or return at any point if he has questions or concerns related to his previous radiation and prostate cancer.  Freeman Caldron, PAC

## 2016-11-05 NOTE — Progress Notes (Signed)
  Radiation Oncology         [phone]) [phone] ________________________________  Name: Bradley Ray MRN: [id number]  Date: 11/05/2016  DOB: Nov 19, 1955  COMPLEX SIMULATION NOTE  NARRATIVE:  The patient was brought to the [facility] suite today following prostate seed implantation approximately one month ago.  Identity was confirmed.  All relevant records and images related to the planned course of therapy were reviewed.  Then, the patient was set-up supine.  CT images were obtained.  The CT images were loaded into the planning software.  Then the prostate and rectum were contoured.  Treatment planning then occurred.  The implanted iodine 125 seeds were identified by the physics staff for projection of radiation distribution  I have requested : 3D Simulation  I have requested a DVH of the following structures: Prostate and rectum.    ________________________________  Sheral Apley Tammi Klippel, M.D.

## 2016-11-05 NOTE — Progress Notes (Signed)
Vitals stable. Denies pain. Pre seed IPSS 16 and post seed IPSS 16. Patient reports dysuria. Reports dysuria resolved with AZO for two day but, has since returned. Denies hematuria, leakage or urinary incontinence. Denies fatigue. Denies bowel any complaints.  BP 115/79 (BP Location: Left Arm, Patient Position: Sitting, Cuff Size: Normal)   Pulse 72   Temp 98.2 F (36.8 C) (Oral)   Resp 16   SpO2 100%  Wt Readings from Last 3 Encounters:  10/15/16 171 lb (77.6 kg)  08/10/16 178 lb 3.2 oz (80.8 kg)  06/02/16 170 lb 3.2 oz (77.2 kg)

## 2016-11-16 ENCOUNTER — Encounter: Payer: Self-pay | Admitting: Radiation Oncology

## 2016-11-16 DIAGNOSIS — C61 Malignant neoplasm of prostate: Secondary | ICD-10-CM | POA: Diagnosis not present

## 2016-11-17 ENCOUNTER — Emergency Department (HOSPITAL_COMMUNITY)
Admission: EM | Admit: 2016-11-17 | Discharge: 2016-11-17 | Disposition: A | Discharge status: Home / Self Care | Payer: Medicaid Other | Attending: Emergency Medicine | Admitting: Emergency Medicine

## 2016-11-17 ENCOUNTER — Encounter (HOSPITAL_COMMUNITY): Payer: Self-pay

## 2016-11-17 DIAGNOSIS — Z79899 Other long term (current) drug therapy: Secondary | ICD-10-CM | POA: Insufficient documentation

## 2016-11-17 DIAGNOSIS — Z8546 Personal history of malignant neoplasm of prostate: Secondary | ICD-10-CM | POA: Insufficient documentation

## 2016-11-17 DIAGNOSIS — N3000 Acute cystitis without hematuria: Secondary | ICD-10-CM | POA: Diagnosis not present

## 2016-11-17 DIAGNOSIS — I10 Essential (primary) hypertension: Secondary | ICD-10-CM | POA: Insufficient documentation

## 2016-11-17 DIAGNOSIS — Z7982 Long term (current) use of aspirin: Secondary | ICD-10-CM | POA: Insufficient documentation

## 2016-11-17 DIAGNOSIS — R339 Retention of urine, unspecified: Secondary | ICD-10-CM | POA: Diagnosis present

## 2016-11-17 DIAGNOSIS — Z87891 Personal history of nicotine dependence: Secondary | ICD-10-CM | POA: Insufficient documentation

## 2016-11-17 DIAGNOSIS — Z96651 Presence of right artificial knee joint: Secondary | ICD-10-CM | POA: Insufficient documentation

## 2016-11-17 LAB — CBC WITH DIFFERENTIAL/PLATELET
BASOS ABS: 0 10*3/uL (ref 0.0–0.1)
BASOS PCT: 0 %
EOS ABS: 0.1 10*3/uL (ref 0.0–0.7)
Eosinophils Relative: 2 %
HCT: 38.1 % — ABNORMAL LOW (ref 39.0–52.0)
HEMOGLOBIN: 12.6 g/dL — AB (ref 13.0–17.0)
LYMPHS PCT: 26 %
Lymphs Abs: 1.9 10*3/uL (ref 0.7–4.0)
MCH: 20.4 pg — AB (ref 26.0–34.0)
MCHC: 33.1 g/dL (ref 30.0–36.0)
MCV: 61.7 fL — ABNORMAL LOW (ref 78.0–100.0)
MONO ABS: 0.6 10*3/uL (ref 0.1–1.0)
Monocytes Relative: 8 %
NEUTROS PCT: 64 %
Neutro Abs: 4.6 10*3/uL (ref 1.7–7.7)
PLATELETS: 188 10*3/uL (ref 150–400)
RBC: 6.18 MIL/uL — AB (ref 4.22–5.81)
RDW: 16.5 % — ABNORMAL HIGH (ref 11.5–15.5)
WBC: 7.2 10*3/uL (ref 4.0–10.5)

## 2016-11-17 LAB — BASIC METABOLIC PANEL
Anion gap: 13 (ref 5–15)
BUN: 11 mg/dL (ref 6–20)
CO2: 24 mmol/L (ref 22–32)
Calcium: 9.4 mg/dL (ref 8.9–10.3)
Chloride: 100 mmol/L — ABNORMAL LOW (ref 101–111)
Creatinine, Ser: 0.84 mg/dL (ref 0.61–1.24)
GFR calc Af Amer: >60 mL/min (ref >60)
GFR calc non Af Amer: >60 mL/min (ref >60)
Glucose, Bld: 97 mg/dL (ref 65–99)
POTASSIUM: 2.6 mmol/L — AB (ref 3.5–5.1)
SODIUM: 137 mmol/L (ref 135–145)

## 2016-11-17 LAB — URINALYSIS, ROUTINE W REFLEX MICROSCOPIC
BILIRUBIN URINE: NEGATIVE
Glucose, UA: NEGATIVE mg/dL
Hgb urine dipstick: NEGATIVE
KETONES UR: NEGATIVE mg/dL
LEUKOCYTES UA: NEGATIVE
Nitrite: POSITIVE — AB
PROTEIN: NEGATIVE mg/dL
SQUAMOUS EPITHELIAL / LPF: NONE SEEN
Specific Gravity, Urine: 1.017 (ref 1.005–1.030)
pH: 5 (ref 5.0–8.0)

## 2016-11-17 MED ORDER — LEVOFLOXACIN 750 MG PO TABS
750.0000 mg | ORAL_TABLET | Freq: Once | ORAL | Status: AC
  Administered 2016-11-17: 750 mg via ORAL
  Filled 2016-11-17: qty 1

## 2016-11-17 MED ORDER — POTASSIUM CHLORIDE CRYS ER 20 MEQ PO TBCR
40.0000 meq | EXTENDED_RELEASE_TABLET | Freq: Once | ORAL | Status: AC
  Administered 2016-11-17: 40 meq via ORAL
  Filled 2016-11-17: qty 2

## 2016-11-17 MED ORDER — LEVOFLOXACIN 750 MG PO TABS: 750.0000 mg | ORAL_TABLET | Freq: Every day | ORAL | 0 refills | Status: AC

## 2016-11-17 NOTE — ED Triage Notes (Signed)
Pt states that on Jan 4 he had radiation seeds for prostate cancer. Pt states he has been unable to void in the past three days, just dribbling. C/o of dysuria

## 2016-11-17 NOTE — Discharge Instructions (Signed)
Please take your antibiotics starting tomorrow once a day. Please call to schedule appointment with your urologist in the next few days. Please take care of your fully catheter. If symptoms return or worsen, please return to the nearest emergency department.

## 2016-11-17 NOTE — ED Notes (Signed)
Bladder scan 936

## 2016-11-17 NOTE — ED Notes (Signed)
ED Provider at bedside. 

## 2016-11-17 NOTE — ED Notes (Signed)
Patient able to ambulate independently  

## 2016-11-17 NOTE — ED Provider Notes (Signed)
[facility] DEPT Provider Note   CSN: [id number] Arrival date & time: 11/17/16  1927     History   Chief Complaint Chief Complaint  Patient presents with  . Urinary Retention    HPI Bradley Ray is a 61 y.o. male with past medical history significant for prostate cancer status post radiation seed placement last month who presents with 3 days of urinary retention, dysuria, and worsening abdominal pain/pressure. Patient reports that he had a prostate procedure placing radioactive seeds to treat his prostate cancer 1 month ago. He says that initially, for the first 2 weeks, he had dysuria that improved with Azo. He says that he continued to do well until Sunday when he had onset of dysuria again. He said this led to inability to urinate. He says that he then had worsening abdominal pressure building in his lower abdomen. He says that pain continue to a 10 out of 10 severity prompting him to seek evaluation today. He says he was able to dribble out small amounts of urine.  Fevers, chills, chest pain, shortness of breath, nausea, vomiting, diarrhea. He says that he was having constipation because of his bladder pressure and abdominal pain. He says the pain did not radiate and it was tightness.     HPI  Past Medical History:  Diagnosis Date  . Alcoholism (HCC)    per pt as of 10-08-2016 as cut down drinking to 2-3 cans per day --- per epic documentation long hx dependence  . Beta thalassemia trait    per hematologist/ oncologist note- dr gorrsch--  per blood work-up  dx 02/ 2017  . BPH with obstruction/lower urinary tract symptoms   . Cocaine use    last used as of 10-08-2016 pt stated "had a little powder before christmas"  . ED (erectile dysfunction)   . Hepatitis    states was 30 yrs ago from "food"  . Hyperlipidemia   . Hypertension   . Prostate cancer (HCC) urologist-  dr wrenn/  oncologist-  dr manning   dx 07-02-2016,  T1c,  Gleason 3+3,  PSA 6.48,  48.44cc  . 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 Wears dentures    upper    Patient Active Problem List   Diagnosis Date Noted  . Malignant neoplasm of prostate (HCC) 08/10/2016  . 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 Essential hypertension 11/26/2015  . 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 High anion gap metabolic acidosis 11/26/2015  . Substance abuse in remission 11/26/2015    Past Surgical History:  Procedure Laterality Date  . CYSTOSCOPY N/A 10/15/2016   Procedure: CYSTOSCOPY FLEXIBLE;  Surgeon: John Wrenn, MD;  Location: [facility];  Service: Urology;  Laterality: N/A;  NO SEEDS FOUND IN BLADDER  . ORIF LEFT ANKLE  1980\'s  . PROSTATE BIOPSY    . RADIOACTIVE SEED IMPLANT N/A 10/15/2016   Procedure: RADIOACTIVE SEED IMPLANT/BRACHYTHERAPY IMPLANT;  Surgeon: John Wrenn, MD;  Location: [facility];  Service: Urology;  Laterality: N/A;     66     SEEDS IMPLANTED  . REPAIR EXTENSOR TENDON Right 06/02/2016   Procedure: RIGHT LONG FINGER EXTENSOR TENDON REPAIR;  Surgeon: Milly Jakob, MD;  Location: [place];  Service: Orthopedics;  Laterality: Right;  . TOTAL KNEE ARTHROPLASTY Right 2010  approx.  . TRANSTHORACIC ECHOCARDIOGRAM  11/28/2015   mild focal basal hypertrophy of septum,  ef 50-55%, possible hypokinesis of mid inferoseptal and apical septal myocardium, grade 1 diastolic dysfunction/  trivial PR       Home Medications    Prior to Admission medications   Medication Sig Start Date End Date Taking? Authorizing Provider  acetaminophen (TYLENOL) 500 MG tablet Take 1,000 mg by mouth daily as needed for moderate pain or headache.    Historical Provider, MD  amLODipine (NORVASC) 10 MG tablet Take 1 tablet (10 mg total) by mouth daily. Patient taking differently: Take 10 mg by mouth every morning.  11/29/15   Kelvin Cellar, MD  aspirin 81 MG tablet Take 162 mg by mouth daily.      Historical Provider, MD  atorvastatin (LIPITOR) 40 MG tablet Take 1 tablet (40 mg total) by mouth daily. Patient taking differently: Take 40 mg by mouth every morning.  11/28/15   Kelvin Cellar, MD  hydrochlorothiazide (MICROZIDE) 12.5 MG capsule Take 12.5 mg by mouth every morning.     Historical Provider, MD  HYDROcodone-acetaminophen (NORCO) 5-325 MG tablet Take 1 tablet by mouth every 6 (six) hours as needed for moderate pain. 10/15/16   [name], MD    Family History Family History  Problem Relation Age of Onset  . Arrhythmia Mother     Has pacer  . Throat cancer Father   . Pancreatic cancer Sister   . Clotting disorder Sister     related knee surgeries  . Cancer Sister     pancreatic  . Sickle cell trait Son   . Cancer Maternal Aunt   . Cancer Maternal Uncle   . Cancer Other     Social History Social History  Substance Use Topics  . Smoking status: Former Smoker    Packs/day: 1.00    Years: 30.00    Types: Cigarettes    Quit date: 06/01/2006  . Smokeless tobacco: Never Used  . Alcohol use 9.6 - 13.8 oz/week    14 - 21 Cans of beer, 2 Shots of liquor per week     Comment: per pt 10/13/16- 2-3 cans daily, shots 2 per weekend (per epic documentation (feb 2017) admitted to  2 pints spirits, 12 pk beer, and 1-2 40oz drink daily)     Allergies   Morphine and related   Review of Systems Review of Systems  Constitutional: Negative for activity change, chills, diaphoresis, fatigue and fever.  HENT: Negative for congestion and rhinorrhea.   Eyes: Negative for visual disturbance.  Respiratory: Negative for cough, chest tightness, shortness of breath, wheezing and stridor.   Cardiovascular: Negative for chest pain, palpitations and leg swelling.  Gastrointestinal: Positive for abdominal distention and abdominal pain. Negative for blood in stool, constipation, diarrhea, nausea and vomiting.  Genitourinary: Positive for difficulty urinating and dysuria. Negative for flank  pain, frequency and hematuria.  Musculoskeletal: Negative for back pain, gait problem, neck pain and neck stiffness.  Skin: Negative for rash and wound.  Neurological: Negative for dizziness, weakness, light-headedness and headaches.  Psychiatric/Behavioral: Negative for agitation.  All other systems reviewed and are negative.    Physical Exam Updated Vital Signs BP 116/89   Pulse 92   Temp 99 F (37.2 C) (Oral)   Resp 22   Ht 5\' 5"  (1.651 m)   Wt 176 lb (79.8 kg)   SpO2 99%   BMI 29.29 kg/m   Physical Exam  Constitutional: He appears well-developed and well-nourished. No distress.  HENT:  Head: Normocephalic and atraumatic.  Mouth/Throat: Oropharynx is clear and moist.  Eyes: Conjunctivae are normal.  Neck: Neck supple.  Cardiovascular: Normal rate and regular rhythm.   No murmur heard. Pulmonary/Chest: Effort normal and breath sounds normal. No stridor. No respiratory distress. He exhibits no tenderness.  Abdominal: Soft. There is no tenderness.  No CVA tenderness on exam.  Genitourinary: No penile tenderness.  Genitourinary Comments: Foley catheter in place upon my initial evaluation. Foley catheter placed in triage.  Musculoskeletal: He exhibits no edema or tenderness.  Neurological: He is alert. No sensory deficit.  Skin: Skin is warm and dry. Capillary refill takes less than 2 seconds. He is not diaphoretic. No erythema.  Psychiatric: He has a normal mood and affect.  Nursing note and vitals reviewed.    ED Treatments / Results  Labs (all labs ordered are listed, but only abnormal results are displayed) Labs Reviewed  URINALYSIS, ROUTINE W REFLEX MICROSCOPIC - Abnormal; Notable for the following:       Result Value   Color, Urine AMBER (*)    Nitrite POSITIVE (*)    Bacteria, UA FEW (*)    All other components within normal limits  CBC WITH DIFFERENTIAL/PLATELET - Abnormal; Notable for the following:    RBC 6.18 (*)    Hemoglobin 12.6 (*)    HCT 38.1 (*)     MCV 61.7 (*)    MCH 20.4 (*)    RDW 16.5 (*)    All other components within normal limits  BASIC METABOLIC PANEL - Abnormal; Notable for the following:    Potassium 2.6 (*)    Chloride 100 (*)    All other components within normal limits  URINE CULTURE    EKG  EKG Interpretation  Date/Time:  Tuesday November 17 2016 22:04:22 EST Ventricular Rate:  91 PR Interval:    QRS Duration: 106 QT Interval:  382 QTC Calculation: 470 R Axis:   6 Text Interpretation:  Sinus rhythm Prolonged PR interval Borderline low voltage, extremity leads When compared to prior, no significant changes were seen.  No sTEMI Confirmed by [facility] MD, [place] [id number]) on 11/17/2016 11:30:24 PM       Radiology No results found.  Procedures Procedures (including critical care time)  Medications Ordered in ED Medications  potassium chloride SA (K-DUR,KLOR-CON) CR tablet 40 mEq (40 mEq Oral Given 11/17/16 2210)  levofloxacin (LEVAQUIN) tablet 750 mg (750 mg Oral Given 11/17/16 2225)     Initial Impression / Assessment and Plan / ED Course  I have reviewed the triage vital signs and the nursing notes.  Pertinent labs & imaging results that were available during my care of the patient were reviewed by me and considered in my medical decision making (see chart for details).     ELYJIAH BOROWSKY is a 61 y.o. male with past medical history significant for prostate cancer status post radiation seed placement last month who presents with 3 days of urinary retention, dysuria, and worsening abdominal pain/pressure.  History and exam are seen above.  While in triage, patient had a bladder scan performed showing almost 1 L of urine in his bladder. Foley catheter was placed with immediate relief of abdominal pain, pressure, and retention. Patient denied any bleeding and says that the urine color is secondary to his Azo use. He says he has continued to have no other symptoms. On exam, patient had no abdominal  tenderness and Foley catheter was in place.  Given the dysuria patient had preceding symptoms, patient will have urinalysis to look for infection causing his retention. Patient  will also have screening blood testing to look for elevation in creatinine given the 3 days of obstruction.   Anticipate discharge with Foley catheter in place if laboratory testing is reassuring.  Urinalysis showed evidence of nitrites and bacteria. Patient will be treated for urinary tract infection. Laboratory testing also showed evidence of hypokalemia with a potassium of 2.6. EKG was unremarkable. Patient given oral potassium supplementation.  Neurology was called and they recommended discharge with Foley remaining in place. They will see him in clinic in the next few days. They agreed with plan for antibiotics and close follow-up. Patient had resolution of his abdominal pain and hypertension with a Foley.  Patient discharged in good condition with understanding of return precautions for worsening infection will return of retention.    Final Clinical Impressions(s) / ED Diagnoses   Final diagnoses:  Urinary retention  Acute cystitis without hematuria    New Prescriptions New Prescriptions   LEVOFLOXACIN (LEVAQUIN) 750 MG TABLET    Take 1 tablet (750 mg total) by mouth daily.    Clinical Impression: 1. Urinary retention   2. Acute cystitis without hematuria     Disposition: Discharge  Condition: Good  I have discussed the results, Dx and Tx plan with the pt(& family if present). He/she/they expressed understanding and agree(s) with the plan. Discharge instructions discussed at great length. Strict return precautions discussed and pt &/or family have verbalized understanding of the instructions. No further questions at time of discharge.    New Prescriptions   LEVOFLOXACIN (LEVAQUIN) 750 MG TABLET    Take 1 tablet (750 mg total) by mouth daily.    Follow Up: [facility] Schedule an appointment as soon as possible for a visit    [place] [id number] [place] [id number]  If symptoms worsen     Courtney Paris, MD 11/17/16 2330

## 2016-11-19 LAB — URINE CULTURE: CULTURE: NO GROWTH

## 2016-11-22 NOTE — Progress Notes (Signed)
  Radiation Oncology         [phone] ________________________________  Name: LAQUARIUS STEMARIE MRN: [id number]  Date: 11/16/2016  DOB: 03/25/1956  3D Planning Note   Prostate Brachytherapy Post-Implant Dosimetry  Diagnosis: 61 y.o. gentleman with stage T1c adenocarcinoma of the prostate with a Gleason's score of 3+3 and a PSA of 6.48  Narrative: On a previous date, VEIKKO BELOTTI returned following prostate seed implantation for post implant planning. He underwent CT scan complex simulation to delineate the three-dimensional structures of the pelvis and demonstrate the radiation distribution.  Since that time, the seed localization, and complex isodose planning with dose volume histograms have now been completed.  Results:   Prostate Coverage - The dose of radiation delivered to the 90% or more of the prostate gland (D90) was 110.87% of the prescription dose. This exceeds our goal of greater than 90%. Rectal Sparing - The volume of rectal tissue receiving the prescription dose or higher was 0.0 cc. This falls under our thresholds tolerance of 1.0 cc.  Impression: The prostate seed implant appears to show adequate target coverage and appropriate rectal sparing.  Plan:  The patient will continue to follow with urology for ongoing PSA determinations. I would anticipate a high likelihood for local tumor control with minimal risk for rectal morbidity.  ________________________________  Sheral Apley Tammi Klippel, M.D.

## 2016-11-26 ENCOUNTER — Emergency Department (HOSPITAL_COMMUNITY)
Admission: EM | Admit: 2016-11-26 | Discharge: 2016-11-26 | Disposition: A | Discharge status: Home / Self Care | Payer: Medicaid Other | Attending: Emergency Medicine | Admitting: Emergency Medicine

## 2016-11-26 ENCOUNTER — Encounter (HOSPITAL_COMMUNITY): Payer: Self-pay | Admitting: *Deleted

## 2016-11-26 DIAGNOSIS — R338 Other retention of urine: Secondary | ICD-10-CM

## 2016-11-26 DIAGNOSIS — Z8546 Personal history of malignant neoplasm of prostate: Secondary | ICD-10-CM | POA: Diagnosis not present

## 2016-11-26 DIAGNOSIS — I10 Essential (primary) hypertension: Secondary | ICD-10-CM | POA: Insufficient documentation

## 2016-11-26 DIAGNOSIS — R339 Retention of urine, unspecified: Secondary | ICD-10-CM | POA: Diagnosis present

## 2016-11-26 DIAGNOSIS — Z79899 Other long term (current) drug therapy: Secondary | ICD-10-CM | POA: Insufficient documentation

## 2016-11-26 DIAGNOSIS — Z87891 Personal history of nicotine dependence: Secondary | ICD-10-CM | POA: Insufficient documentation

## 2016-11-26 DIAGNOSIS — Z96651 Presence of right artificial knee joint: Secondary | ICD-10-CM | POA: Diagnosis not present

## 2016-11-26 DIAGNOSIS — Z7982 Long term (current) use of aspirin: Secondary | ICD-10-CM | POA: Diagnosis not present

## 2016-11-26 LAB — URINALYSIS, ROUTINE W REFLEX MICROSCOPIC
BACTERIA UA: NONE SEEN
Bilirubin Urine: NEGATIVE
Glucose, UA: NEGATIVE mg/dL
Ketones, ur: NEGATIVE mg/dL
Leukocytes, UA: NEGATIVE
Nitrite: POSITIVE — AB
PH: 6 (ref 5.0–8.0)
Protein, ur: NEGATIVE mg/dL
SPECIFIC GRAVITY, URINE: 1.012 (ref 1.005–1.030)

## 2016-11-26 NOTE — ED Provider Notes (Signed)
[facility] DEPT Provider Note   CSN: [id number] Arrival date & time: 11/26/16  0419  Time seen 05:05 AM   History   Chief Complaint Chief Complaint  Patient presents with  . Urinary Retention    HPI Bradley Ray is a 61 y.o. male.  HPI  patient states he had radiation seeds inserted and is prostate on January 4. He was seen on February 6 with inability to urinate for about 3 days and was found to have about a liter of urine in his bladder. He had a Foley catheter inserted at that time. He was seen yesterday, February 14 at urologist office and they removed his Foley catheter. He was able to urinate in the office. He states however during the day he started noticing his stream was getting smaller. He finally was unable to urinate about 7 PM. He states about 1 AM he started having lower abdominal discomfort similar to what he had before. He denies nausea, vomiting, or diaphoresis.  PCP Philis Fendt, MD Urology Dr Jeffie Pollock  Past Medical History:  Diagnosis Date  . Alcoholism ([place])    per pt as of 10-08-2016 as cut down drinking to 2-3 cans per day --- per epic documentation long hx dependence  . Beta thalassemia trait    per hematologist/ oncologist note- dr Dorita Sciara--  per blood work-up  dx 02/ 2017  . BPH with obstruction/lower urinary tract symptoms   . Cocaine use    last used as of 10-08-2016 pt stated "had a little powder before christmas"  . ED (erectile dysfunction)   . Hepatitis    states was 30 yrs ago from "food"  . Hyperlipidemia   . Hypertension   . Prostate cancer [facility]) urologist-  dr wrenn/  oncologist-  dr Tammi Klippel   dx 07-02-2016,  T1c,  Gleason 3+3,  PSA 6.48,  48.44cc  . 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 Wears dentures    upper    Patient Active Problem List   Diagnosis Date Noted  . Malignant neoplasm of prostate ([place])  08/10/2016  . 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 Essential hypertension 11/26/2015  . 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 High anion gap metabolic acidosis [id number]  . Substance abuse in remission 11/26/2015    Past Surgical History:  Procedure Laterality Date  . CYSTOSCOPY N/A 10/15/2016   Procedure: CYSTOSCOPY FLEXIBLE;  Surgeon: [name], MD;  Location: [facility];  Service: Urology;  Laterality: N/A;  NO SEEDS FOUND IN BLADDER  . ORIF LEFT ANKLE  1980's  . PROSTATE BIOPSY    . RADIOACTIVE SEED IMPLANT N/A 10/15/2016   Procedure: RADIOACTIVE SEED IMPLANT/BRACHYTHERAPY IMPLANT;  Surgeon: [name], MD;  Location: [facility];  Service: Urology;  Laterality: N/A;     66    SEEDS IMPLANTED  . REPAIR EXTENSOR TENDON Right 06/02/2016   Procedure: RIGHT LONG FINGER EXTENSOR TENDON REPAIR;  Surgeon: Milly Jakob, MD;  Location: [facility];  Service: Orthopedics;  Laterality: Right;  . TOTAL KNEE ARTHROPLASTY Right 2010  approx.  . TRANSTHORACIC ECHOCARDIOGRAM  11/28/2015   mild focal basal hypertrophy of septum, ef 50-55%, possible hypokinesis of mid inferoseptal and apical septal myocardium, grade 1 diastolic dysfunction/  trivial PR       Home Medications    Prior to Admission medications   Medication Sig Start Date End Date  Taking? Authorizing Provider  acetaminophen (TYLENOL) 500 MG tablet Take 1,000 mg by mouth daily as needed for moderate pain or headache.   Yes Historical Provider, MD  amLODipine (NORVASC) 10 MG tablet Take 1 tablet (10 mg total) by mouth daily. Patient taking differently: Take 10 mg by mouth every morning.  11/29/15  Yes Kelvin Cellar, MD  aspirin 81 MG tablet Take 162 mg by mouth daily.    Yes Historical Provider, MD  atorvastatin (LIPITOR) 40 MG tablet Take 1 tablet (40 mg total) by mouth daily. Patient taking differently: Take 40 mg by mouth every morning.  11/28/15  Yes Kelvin Cellar, MD  hydrochlorothiazide (MICROZIDE) 12.5 MG capsule Take 12.5 mg  by mouth every morning.    Yes Historical Provider, MD  HYDROcodone-acetaminophen (NORCO) 5-325 MG tablet Take 1 tablet by mouth every 6 (six) hours as needed for moderate pain. 10/15/16  Yes [name], MD  tamsulosin (FLOMAX) 0.4 MG CAPS capsule Take 0.4 mg by mouth at bedtime.   Yes Historical Provider, MD    Family History Family History  Problem Relation Age of Onset  . Arrhythmia Mother     Has pacer  . Throat cancer Father   . Pancreatic cancer Sister   . Clotting disorder Sister     related knee surgeries  . Cancer Sister     pancreatic  . Sickle cell trait Son   . Cancer Maternal Aunt   . Cancer Maternal Uncle   . Cancer Other     Social History Social History  Substance Use Topics  . Smoking status: Former Smoker    Packs/day: 1.00    Years: 30.00    Types: Cigarettes    Quit date: 06/01/2006  . Smokeless tobacco: Never Used  . Alcohol use 9.6 - 13.8 oz/week    14 - 21 Cans of beer, 2 Shots of liquor per week     Comment: per pt 10/13/16- 2-3 cans daily, shots 2 per weekend (per epic documentation (feb 2017) admitted to  2 pints spirits, 12 pk beer, and 1-2 40oz drink daily)  on disability for his back   Allergies   Morphine and related   Review of Systems Review of Systems  All other systems reviewed and are negative.    Physical Exam Updated Vital Signs BP 129/98 (BP Location: Left Arm)   Pulse 111   Temp 98.1 F (36.7 C) (Oral)   Resp 24   Ht 5\' 5"  (1.651 m)   Wt 176 lb (79.8 kg)   SpO2 96%   BMI 29.29 kg/m   Vital signs normal except for tachycardia   Physical Exam  Constitutional: He appears well-developed and well-nourished. No distress.  Patient seen after his Foley catheter was inserted. He states he feels much improved.  HENT:  Head: Normocephalic and atraumatic.  Right Ear: External ear normal.  Left Ear: External ear normal.  Nose: Nose normal.  Eyes: Conjunctivae and EOM are normal.  Neck: Normal range of motion.    Pulmonary/Chest: Effort normal. No respiratory distress.  Abdominal: Soft. Bowel sounds are normal. He exhibits no distension. There is no tenderness.  Abdomen examined after Foley catheter was placed. Patient's noted to have about 600 mL in his Foley bag.  Nursing note and vitals reviewed.    ED Treatments / Results  Labs (all labs ordered are listed, but only abnormal results are displayed) Results for orders placed or performed during the hospital encounter of 11/26/16  Urinalysis, Routine w reflex microscopic  Result Value Ref Range   Color, Urine AMBER (A) YELLOW   APPearance CLEAR CLEAR   Specific Gravity, Urine 1.012 1.005 - 1.030   pH 6.0 5.0 - 8.0   Glucose, UA NEGATIVE NEGATIVE mg/dL   Hgb urine dipstick LARGE (A) NEGATIVE   Bilirubin Urine NEGATIVE NEGATIVE   Ketones, ur NEGATIVE NEGATIVE mg/dL   Protein, ur NEGATIVE NEGATIVE mg/dL   Nitrite POSITIVE (A) NEGATIVE   Leukocytes, UA NEGATIVE NEGATIVE   RBC / HPF TOO NUMEROUS TO COUNT 0 - 5 RBC/hpf   WBC, UA 0-5 0 - 5 WBC/hpf   Bacteria, UA NONE SEEN NONE SEEN   Squamous Epithelial / LPF 0-5 (A) NONE SEEN   Laboratory interpretation all normal except hematuria c/w catheter insertion    Procedures Procedures (including critical care time)    Initial Impression / Assessment and Plan / ED Course  I have reviewed the triage vital signs and the nursing notes.  Pertinent labs & imaging results that were available during my care of the patient were reviewed by me and considered in my medical decision making (see chart for details).  Bladder scan done by nursing staff on arrival shows 622 mL. Foley catheter was inserted by nursing staff.  UA was sent off to make sure he does not have infection that is making him have urinary retention again.  Patient's urine just has a lot of blood in it. He was not started on antibiotics. He is to let Dr. Ralene Muskrat office know that he had to come back this morning and have the catheter  reinserted. They will probably want to recheck him again in the next 1-2 weeks.  Final Clinical Impressions(s) / ED Diagnoses   Final diagnoses:  Acute urinary retention    Plan discharge  Rolland Porter, MD, Barbette Or, MD 11/26/16 [phone]

## 2016-11-26 NOTE — Discharge Instructions (Signed)
Call Dr Ralene Muskrat office this morning to let them know you had to have the catheter reinserted this morning. He will probably want to check you again in 1-2 weeks.

## 2016-11-26 NOTE — ED Triage Notes (Signed)
Patient states he just had a foley removed yest after having it in for 2 weeks. States he had radiation seeds implanted Jan. 2018, states he hasn't been able to urinated since 9pm last night

## 2016-11-26 NOTE — ED Notes (Signed)
Pt given leg bag to go home with.

## 2016-11-26 NOTE — ED Notes (Signed)
Dr. Tomi Bamberger made aware of pt bladder scan of 634ml. VO for foley obtained.

## 2017-01-08 ENCOUNTER — Emergency Department (HOSPITAL_COMMUNITY)
Admission: EM | Admit: 2017-01-08 | Discharge: 2017-01-08 | Disposition: A | Discharge status: Home / Self Care | Payer: Medicaid Other | Attending: Emergency Medicine | Admitting: Emergency Medicine

## 2017-01-08 ENCOUNTER — Encounter (HOSPITAL_COMMUNITY): Payer: Self-pay | Admitting: *Deleted

## 2017-01-08 DIAGNOSIS — Z87891 Personal history of nicotine dependence: Secondary | ICD-10-CM | POA: Insufficient documentation

## 2017-01-08 DIAGNOSIS — Z7982 Long term (current) use of aspirin: Secondary | ICD-10-CM | POA: Diagnosis not present

## 2017-01-08 DIAGNOSIS — Z96641 Presence of right artificial hip joint: Secondary | ICD-10-CM | POA: Insufficient documentation

## 2017-01-08 DIAGNOSIS — R339 Retention of urine, unspecified: Secondary | ICD-10-CM | POA: Diagnosis not present

## 2017-01-08 DIAGNOSIS — I10 Essential (primary) hypertension: Secondary | ICD-10-CM | POA: Insufficient documentation

## 2017-01-08 DIAGNOSIS — Z8546 Personal history of malignant neoplasm of prostate: Secondary | ICD-10-CM | POA: Insufficient documentation

## 2017-01-08 NOTE — ED Triage Notes (Signed)
Pt reports having foley cath placed here about a month ago, since then pt has seen urologist who placed a foley, pt reports here for replacement bag, pt states, "I cannot mess my mom's car up." pt unable to purchase a bag d/t being on a fixed income

## 2017-01-08 NOTE — Discharge Instructions (Signed)
Please go to your scheduled urology appointment with [facility] on Monday.   Get help right away if: You develop chills or fever. Your catheter stops draining urine. Your catheter falls out. You start to develop increased bleeding that does not respond to rest and increased fluid intake.

## 2017-01-08 NOTE — ED Provider Notes (Signed)
[place] DEPT Provider Note   CSN: [id number] Arrival date & time: 01/08/17  1623   By signing my name below, I, Delton Prairie, attest that this documentation has been prepared under the direction and in the presence of  Javeon Macmurray- PA-C. Electronically Signed: Delton Prairie, ED Scribe. 01/08/17. 5:51 PM.   History   Chief Complaint Chief Complaint  Patient presents with  . Urinary Retention    needs replacement foley cath bag    HPI Comments:  Bradley Ray is a 61 y.o. male who presents to the Emergency Department requesting to have his foley catheter bag after it "burst" today. Pt states he has had the foley catheter in place for about 1 month now. Pt has a follow up with an urologist at [facility] on 01/11/17. No alleviating factors noted. Pt denies fevers, chills, nausea, vomiting or any other associated symptoms. No other complaints noted.   The history is provided by the patient. No language interpreter was used.    Past Medical History:  Diagnosis Date  . Alcoholism ([place])    per pt as of 10-08-2016 as cut down drinking to 2-3 cans per day --- per epic documentation long hx dependence  . Beta thalassemia trait    per hematologist/ oncologist note- dr Dorita Sciara--  per blood work-up  dx 02/ 2017  . BPH with obstruction/lower urinary tract symptoms   . Cocaine use    last used as of 10-08-2016 pt stated "had a little powder before christmas"  . ED (erectile dysfunction)   . Hepatitis    states was 30 yrs ago from "food"  . Hyperlipidemia   . Hypertension   . Prostate cancer [facility]) urologist-  dr wrenn/  oncologist-  dr Tammi Klippel   dx 07-02-2016,  T1c,  Gleason 3+3,  PSA 6.48,  48.44cc  . 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 Wears dentures    upper    Patient Active Problem List   Diagnosis Date Noted  . Malignant neoplasm of prostate ([place])  08/10/2016  . 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 Essential hypertension 11/26/2015  . 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 High anion gap metabolic acidosis 28/41/3244  . Substance abuse in remission 11/26/2015    Past Surgical History:  Procedure Laterality Date  . CYSTOSCOPY N/A 10/15/2016   Procedure: CYSTOSCOPY FLEXIBLE;  Surgeon: [name], MD;  Location: [facility];  Service: Urology;  Laterality: N/A;  NO SEEDS FOUND IN BLADDER  . ORIF LEFT ANKLE  1980's  . PROSTATE BIOPSY    . RADIOACTIVE SEED IMPLANT N/A 10/15/2016   Procedure: RADIOACTIVE SEED IMPLANT/BRACHYTHERAPY IMPLANT;  Surgeon: [name], MD;  Location: [facility];  Service: Urology;  Laterality: N/A;     66    SEEDS IMPLANTED  . REPAIR EXTENSOR TENDON Right 06/02/2016   Procedure: RIGHT LONG FINGER EXTENSOR TENDON REPAIR;  Surgeon: Milly Jakob, MD;  Location: [facility];  Service: Orthopedics;  Laterality: Right;  . TOTAL KNEE ARTHROPLASTY Right 2010  approx.  . TRANSTHORACIC ECHOCARDIOGRAM  11/28/2015   mild focal basal hypertrophy of septum, ef 50-55%, possible hypokinesis of mid inferoseptal and apical septal myocardium, grade 1 diastolic dysfunction/  trivial PR    Home Medications    Prior to Admission medications   Medication Sig Start Date End Date Taking? Authorizing Provider  acetaminophen (TYLENOL) 500 MG tablet Take 1,000  mg by mouth daily as needed for moderate pain or headache.    Historical Provider, MD  amLODipine (NORVASC) 10 MG tablet Take 1 tablet (10 mg total) by mouth daily. Patient taking differently: Take 10 mg by mouth every morning.  11/29/15   Kelvin Cellar, MD  aspirin 81 MG tablet Take 162 mg by mouth daily.     Historical Provider, MD  atorvastatin (LIPITOR) 40 MG tablet Take 1 tablet (40 mg total) by mouth daily. Patient taking differently: Take 40 mg by mouth every morning.  11/28/15   Kelvin Cellar, MD  hydrochlorothiazide (MICROZIDE) 12.5 MG capsule Take 12.5 mg by mouth every  morning.     Historical Provider, MD  HYDROcodone-acetaminophen (NORCO) 5-325 MG tablet Take 1 tablet by mouth every 6 (six) hours as needed for moderate pain. 10/15/16   [name], MD  tamsulosin (FLOMAX) 0.4 MG CAPS capsule Take 0.4 mg by mouth at bedtime.    Historical Provider, MD    Family History Family History  Problem Relation Age of Onset  . Arrhythmia Mother     Has pacer  . Throat cancer Father   . Pancreatic cancer Sister   . Clotting disorder Sister     related knee surgeries  . Cancer Sister     pancreatic  . Sickle cell trait Son   . Cancer Maternal Aunt   . Cancer Maternal Uncle   . Cancer Other     Social History Social History  Substance Use Topics  . Smoking status: Former Smoker    Packs/day: 1.00    Years: 30.00    Types: Cigarettes    Quit date: 06/01/2006  . Smokeless tobacco: Never Used  . Alcohol use 9.6 - 13.8 oz/week    14 - 21 Cans of beer, 2 Shots of liquor per week     Comment: per pt 10/13/16- 2-3 cans daily, shots 2 per weekend (per epic documentation (feb 2017) admitted to  2 pints spirits, 12 pk beer, and 1-2 40oz drink daily)     Allergies   Morphine and related   Review of Systems Review of Systems  Constitutional: Negative for chills and fever.  Gastrointestinal: Negative for nausea and vomiting.    Physical Exam Updated Vital Signs BP 136/80 (BP Location: Left Arm)   Pulse 98   Temp 98.3 F (36.8 C) (Oral)   Resp 18   Ht 5\' 5"  (1.651 m)   Wt 79.8 kg   SpO2 98%   BMI 29.29 kg/m   Physical Exam  Constitutional: He appears well-developed and well-nourished.  Well appearing  HENT:  Head: Normocephalic and atraumatic.  Nose: Nose normal.  Eyes: Conjunctivae and EOM are normal.  Neck: Normal range of motion.  Cardiovascular: Normal rate, regular rhythm and normal heart sounds.   Pulmonary/Chest: Effort normal and breath sounds normal. No respiratory distress.  Normal work of breathing. No respiratory distress noted.     Abdominal: Soft. There is no tenderness.  Musculoskeletal: Normal range of motion.  Neurological: He is alert.  Skin: Skin is warm.  Psychiatric: He has a normal mood and affect. His behavior is normal.  Nursing note and vitals reviewed.    ED Treatments / Results  DIAGNOSTIC STUDIES:  Oxygen Saturation is 96% on RA, normal by my interpretation.    COORDINATION OF CARE:  5:47 PM Discussed treatment plan with pt at bedside and pt agreed to plan.  Labs (all labs ordered are listed, but only abnormal results are displayed) Labs  Reviewed - No data to display  EKG  EKG Interpretation None       Radiology No results found.  Procedures Procedures (including critical care time)  Medications Ordered in ED Medications - No data to display   Initial Impression / Assessment and Plan / ED Course  I have reviewed the triage vital signs and the nursing notes.  Pertinent labs & imaging results that were available during my care of the patient were reviewed by me and considered in my medical decision making (see chart for details).    Patient presents to ED to have Foley catheter replaced due to it "bursting" today. He has no fevers, chills, or any other systemic symptoms. No apparent distress, afebrile, hemodynamically stable. Heart and lung sounds clear. Abdomen soft and nontender. Patient has an appointment already made on Monday with his urologist at [facility]. Patient given strict instructions to make sure to go to his scheduled appointment. I feel patient is safe for discharge at this time. Reasons to immediately return to the ED discussed.   Final Clinical Impressions(s) / ED Diagnoses   Final diagnoses:  Urinary retention    New Prescriptions Discharge Medication List as of 01/08/2017  6:05 PM    I personally performed the services described in this documentation, which was scribed in my presence. The recorded information has been reviewed and is accurate.     [place], [place] 01/08/17 1813    Veryl Speak, MD 01/08/17 [phone]

## 2017-01-12 ENCOUNTER — Emergency Department (HOSPITAL_COMMUNITY)
Admission: EM | Admit: 2017-01-12 | Discharge: 2017-01-12 | Disposition: A | Discharge status: Home / Self Care | Payer: Medicaid Other | Attending: Emergency Medicine | Admitting: Emergency Medicine

## 2017-01-12 ENCOUNTER — Encounter (HOSPITAL_COMMUNITY): Payer: Self-pay | Admitting: Emergency Medicine

## 2017-01-12 DIAGNOSIS — Z8546 Personal history of malignant neoplasm of prostate: Secondary | ICD-10-CM | POA: Diagnosis not present

## 2017-01-12 DIAGNOSIS — Z7982 Long term (current) use of aspirin: Secondary | ICD-10-CM | POA: Diagnosis not present

## 2017-01-12 DIAGNOSIS — Z87891 Personal history of nicotine dependence: Secondary | ICD-10-CM | POA: Insufficient documentation

## 2017-01-12 DIAGNOSIS — I10 Essential (primary) hypertension: Secondary | ICD-10-CM | POA: Insufficient documentation

## 2017-01-12 DIAGNOSIS — R339 Retention of urine, unspecified: Secondary | ICD-10-CM

## 2017-01-12 DIAGNOSIS — N390 Urinary tract infection, site not specified: Secondary | ICD-10-CM | POA: Insufficient documentation

## 2017-01-12 LAB — URINALYSIS, ROUTINE W REFLEX MICROSCOPIC
Bilirubin Urine: NEGATIVE
GLUCOSE, UA: NEGATIVE mg/dL
Ketones, ur: NEGATIVE mg/dL
Nitrite: NEGATIVE
Protein, ur: 100 mg/dL — AB
SPECIFIC GRAVITY, URINE: 1.006 (ref 1.005–1.030)
SQUAMOUS EPITHELIAL / LPF: NONE SEEN
pH: 6 (ref 5.0–8.0)

## 2017-01-12 MED ORDER — LIDOCAINE HCL 2 % EX GEL
1.0000 "application " | Freq: Once | CUTANEOUS | Status: AC
  Administered 2017-01-12: 1 via TOPICAL

## 2017-01-12 MED ORDER — CIPROFLOXACIN HCL 500 MG PO TABS: 500.0000 mg | ORAL_TABLET | Freq: Two times a day (BID) | ORAL | 0 refills | Status: DC

## 2017-01-12 MED ORDER — CIPROFLOXACIN HCL 500 MG PO TABS
500.0000 mg | ORAL_TABLET | Freq: Once | ORAL | Status: AC
  Administered 2017-01-12: 500 mg via ORAL
  Filled 2017-01-12: qty 1

## 2017-01-12 NOTE — ED Provider Notes (Addendum)
By signing my name below, I, Georgette Shell, attest that this documentation has been prepared under the direction and in the presence of Roman Forest, DO. Electronically Signed: Georgette Shell, ED Scribe. 01/12/17. 1:35 AM.  TIME SEEN: 1:29 AM  CHIEF COMPLAINT:  Chief Complaint  Patient presents with  . Urinary Retention   HPI:  HPI Comments: Bradley Ray is a 61 y.o. male with h/o alcoholism, cocaine use, hepatitis, prostate cancer, HLD, and HTN, who presents to the Emergency Department complaining of urinary retention onset 10pm last night. Pt has h/o prostate cancer and reports he has had reoccurring problems with urinary retention. He was seen by a urologist Quillian Quince B. Rukstalis, MD) at [facility] for catheter removal early yesterday and given one dose of Cipro. Has been followed by Dr. Jeffie Pollock.  He has not tried any OTC medications PTA. Pt denies fever, chills, nausea, vomiting, or any other associated symptoms.Catheter has been in placed in the emergency department and patient reports feeling much better.  No new medications other than a dose of Cipro at the urologist office yesterday. No back pain. No focal neurologic deficits. Urinary retention thought secondary to his history of prostate cancer per patient.  PCP: Philis Fendt, MD  Urologist: [name], MD  ROS: See HPI Constitutional: no fever  Eyes: no drainage  ENT: no runny nose   Cardiovascular:  no chest pain  Resp: no SOB  GI: no vomiting GU: no dysuria Integumentary: no rash  Allergy: no hives  Musculoskeletal: no leg swelling  Neurological: no slurred speech ROS otherwise negative  PAST MEDICAL HISTORY/PAST SURGICAL HISTORY:  Past Medical History:  Diagnosis Date  . Alcoholism ([place])    per pt as of 10-08-2016 as cut down drinking to 2-3 cans per day --- per epic documentation long hx dependence  . Beta thalassemia trait    per hematologist/ oncologist note- dr Dorita Sciara--  per blood work-up  dx 02/ 2017  . BPH with  obstruction/lower urinary tract symptoms   . Cocaine use    last used as of 10-08-2016 pt stated "had a little powder before christmas"  . ED (erectile dysfunction)   . Hepatitis    states was 30 yrs ago from "food"  . Hyperlipidemia   . Hypertension   . Prostate cancer [facility]) urologist-  dr wrenn/  oncologist-  dr Tammi Klippel   dx 07-02-2016,  T1c,  Gleason 3+3,  PSA 6.48,  48.44cc  . 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 Wears dentures    upper    MEDICATIONS:  Prior to Admission medications   Medication Sig Start Date End Date Taking? Authorizing Provider  acetaminophen (TYLENOL) 500 MG tablet Take 1,000 mg by mouth daily as needed for moderate pain or headache.    Historical Provider, MD  amLODipine (NORVASC) 10 MG tablet Take 1 tablet (10 mg total) by mouth daily. Patient taking differently: Take 10 mg by mouth every morning.  11/29/15   Kelvin Cellar, MD  aspirin 81 MG tablet Take 162 mg by mouth daily.     Historical Provider, MD  atorvastatin (LIPITOR) 40 MG tablet Take 1 tablet (40 mg total) by mouth daily. Patient taking differently: Take 40 mg by mouth every morning.  11/28/15   Kelvin Cellar, MD  hydrochlorothiazide (MICROZIDE) 12.5 MG capsule Take 12.5 mg by mouth every morning.  Historical Provider, MD  HYDROcodone-acetaminophen (NORCO) 5-325 MG tablet Take 1 tablet by mouth every 6 (six) hours as needed for moderate pain. 10/15/16   [name], MD  tamsulosin (FLOMAX) 0.4 MG CAPS capsule Take 0.4 mg by mouth at bedtime.    Historical Provider, MD    ALLERGIES:  Allergies  Allergen Reactions  . Morphine And Related Rash    SOCIAL HISTORY:  Social History  Substance Use Topics  . Smoking status: Former Smoker    Packs/day: 1.00    Years: 30.00    Types: Cigarettes    Quit date: 06/01/2006  . Smokeless tobacco: Never Used  . Alcohol use 9.6  - 13.8 oz/week    14 - 21 Cans of beer, 2 Shots of liquor per week     Comment: per pt 10/13/16- 2-3 cans daily, shots 2 per weekend (per epic documentation (feb 2017) admitted to  2 pints spirits, 12 pk beer, and 1-2 40oz drink daily)    FAMILY HISTORY: Family History  Problem Relation Age of Onset  . Arrhythmia Mother     Has pacer  . Throat cancer Father   . Pancreatic cancer Sister   . Clotting disorder Sister     related knee surgeries  . Cancer Sister     pancreatic  . Sickle cell trait Son   . Cancer Maternal Aunt   . Cancer Maternal Uncle   . Cancer Other     EXAM: BP (!) 147/102 (BP Location: Left Arm)   Pulse (!) 136   Temp 98.8 F (37.1 C) (Oral)   Resp (!) 24   SpO2 98%  CONSTITUTIONAL: Alert and oriented and responds appropriately to questions. Well-appearing; well-nourished HEAD: Normocephalic EYES: Conjunctivae clear, pupils appear equal, EOMI ENT: normal nose; moist mucous membranes NECK: Supple, no meningismus, no nuchal rigidity, no LAD  CARD: RRR; S1 and S2 appreciated; no murmurs, no clicks, no rubs, no gallops RESP: Normal chest excursion without splinting or tachypnea; breath sounds clear and equal bilaterally; no wheezes, no rhonchi, no rales, no hypoxia or respiratory distress, speaking full sentences ABD/GI: Normal bowel sounds; non-distended; soft, non-tender, no rebound, no guarding, no peritoneal signs, no hepatosplenomegaly BACK:  The back appears normal and is non-tender to palpation, there is no CVA tenderness EXT: Normal ROM in all joints; non-tender to palpation; no edema; normal capillary refill; no cyanosis, no calf tenderness or swelling    SKIN: Normal color for age and race; warm; no rash NEURO: Moves all extremities equally, Normal gait, normal sensation diffusely PSYCH: The patient's mood and manner are appropriate. Grooming and personal hygiene are appropriate.  MEDICAL DECISION MAKING: Patient here with recurrent urinary retention. He  states he was told by the [place] urologist that if he had another episode of urinary retention he would likely have to perform self-catheterization at home. Fully catheter was placed here for a bladder that had over 500 mL of urine on bladder scan. Initially was tachycardic, tachypneic but this has resolved since catheter has been placed and he feels much better. Abdominal exam is benign. No fever. No vomiting, flank pain. Urine does show large blood, large leukocytes but rare bacteria. This may be contamination from frequent Foley catheters. We'll send urine culture and start him on Cipro. We'll have him call his urologist in the morning for further recommendations. Patient and wife are comfortable with this plan.  At this time, I do not feel there is any life-threatening condition present. I have reviewed and discussed  all results (EKG, imaging, lab, urine as appropriate) and exam findings with patient/family. I have reviewed nursing notes and appropriate previous records.  I feel the patient is safe to be discharged home without further emergent workup and can continue workup as an outpatient as needed. Discussed usual and customary return precautions. Patient/family verbalize understanding and are comfortable with this plan.  Outpatient follow-up has been provided if needed. All questions have been answered.   I personally performed the services described in this documentation, which was scribed in my presence. The recorded information has been reviewed and is accurate.    [place], DO 01/12/17 [place], DO 01/12/17 7001

## 2017-01-12 NOTE — ED Triage Notes (Signed)
Pt presents with urinary retention; last urination was 2 hr PTA; pt recently seen at [facility] for catheter removal

## 2017-01-12 NOTE — ED Notes (Signed)
Patient left at this time with all belongings. Attached leg bag and provided extra foley equipment as requested. Pt denies questions about foley care.

## 2017-01-13 LAB — URINE CULTURE: Culture: NO GROWTH

## 2017-03-15 NOTE — Addendum Note (Signed)
Addendum  created 03/15/17 0952 by Tristen Pennino, MD   Sign clinical note    

## 2018-04-11 ENCOUNTER — Other Ambulatory Visit: Payer: Self-pay

## 2018-04-11 ENCOUNTER — Emergency Department (HOSPITAL_COMMUNITY): Payer: Medicaid Other

## 2018-04-11 ENCOUNTER — Emergency Department (HOSPITAL_COMMUNITY)
Admission: EM | Admit: 2018-04-11 | Discharge: 2018-04-11 | Disposition: A | Discharge status: Home / Self Care | Payer: Medicaid Other | Attending: Emergency Medicine | Admitting: Emergency Medicine

## 2018-04-11 ENCOUNTER — Encounter (HOSPITAL_COMMUNITY): Payer: Self-pay | Admitting: Emergency Medicine

## 2018-04-11 DIAGNOSIS — Z7982 Long term (current) use of aspirin: Secondary | ICD-10-CM | POA: Diagnosis not present

## 2018-04-11 DIAGNOSIS — Z87891 Personal history of nicotine dependence: Secondary | ICD-10-CM | POA: Diagnosis not present

## 2018-04-11 DIAGNOSIS — M25571 Pain in right ankle and joints of right foot: Secondary | ICD-10-CM | POA: Diagnosis not present

## 2018-04-11 DIAGNOSIS — I10 Essential (primary) hypertension: Secondary | ICD-10-CM | POA: Diagnosis not present

## 2018-04-11 DIAGNOSIS — Z79899 Other long term (current) drug therapy: Secondary | ICD-10-CM | POA: Diagnosis not present

## 2018-04-11 MED ORDER — OXYCODONE-ACETAMINOPHEN 5-325 MG PO TABS: 1.0000 | ORAL_TABLET | Freq: Four times a day (QID) | ORAL | 0 refills | Status: DC | PRN

## 2018-04-11 MED ORDER — OXYCODONE-ACETAMINOPHEN 5-325 MG PO TABS
1.0000 | ORAL_TABLET | Freq: Once | ORAL | Status: AC
  Administered 2018-04-11: 1 via ORAL
  Filled 2018-04-11: qty 1

## 2018-04-11 NOTE — Discharge Instructions (Addendum)
The x-ray and exam of your ankle was reassuring.  I have written you prescription for Percocet which can help with pain.  This medicine can make you drowsy so please do not drive or work while taking it.  Please follow-up with the bone doctor if your pain is not improving (I have listed his information below.)  Return to the ER if you have any new or concerning symptoms like fever, redness or increased swelling around the joint.

## 2018-04-11 NOTE — ED Provider Notes (Signed)
[facility] EMERGENCY DEPARTMENT Provider Note   CSN: [id number] Arrival date & time: 04/11/18  0354     History   Chief Complaint Chief Complaint  Patient presents with  . Ankle Pain    HPI Bradley Ray is a 62 y.o. male.  HPI  Bradley Ray is a 62yo male with a history of hypertension, hyperlipidemia, alcoholism who presents to the emergency department for evaluation of right ankle pain.  Patient reports that he was doing the dishes 3 weeks ago and abruptly noticed right medial ankle pain.  He denies inciting injury or twisting the ankle in any way.  States that the pain has gradually worsened throughout this week.  Reports pain is severe, constant and feels throbbing in nature.  Pain is located over the medial aspect of the right ankle and does not radiate.  He also noticed some mild swelling in that area.  Pain is acutely worsened with weightbearing or ankle movement.  He is tried taking Aleve without significant improvement.  He is also been soaking the foot in warm water, but that has not helped much either.  He has been using a crutch to help him ambulate.  He denies fever, chills, redness over the ankle, joint pain elsewhere, open wound, numbness, weakness.  Past Medical History:  Diagnosis Date  . Alcoholism ([place])    per pt as of 10-08-2016 as cut down drinking to 2-3 cans per day --- per epic documentation long hx dependence  . Beta thalassemia trait    per hematologist/ oncologist note- dr Dorita Sciara--  per blood work-up  dx 02/ 2017  . BPH with obstruction/lower urinary tract symptoms   . Cocaine use    last used as of 10-08-2016 pt stated "had a little powder before christmas"  . ED (erectile dysfunction)   . Hepatitis    states was 30 yrs ago from "food"  . Hyperlipidemia   . Hypertension   . Prostate cancer [facility]) urologist-  dr wrenn/  oncologist-  dr Tammi Klippel   dx 07-02-2016,  T1c,  Gleason 3+3,  PSA 6.48,  48.44cc  . 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 Wears dentures    upper    Patient Active Problem List   Diagnosis Date Noted  . Malignant neoplasm of prostate ([place]) 08/10/2016  . 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 Essential hypertension 11/26/2015  . 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 High anion gap metabolic acidosis 65/68/1275  . Substance abuse in remission ([place]) 11/26/2015    Past Surgical History:  Procedure Laterality Date  . CYSTOSCOPY N/A 10/15/2016   Procedure: CYSTOSCOPY FLEXIBLE;  Surgeon: [name], MD;  Location: [facility];  Service: Urology;  Laterality: N/A;  NO SEEDS FOUND IN BLADDER  . ORIF LEFT ANKLE  1980's  . PROSTATE BIOPSY    . RADIOACTIVE SEED IMPLANT N/A 10/15/2016   Procedure: RADIOACTIVE SEED IMPLANT/BRACHYTHERAPY IMPLANT;  Surgeon: [name], MD;  Location: [facility];  Service: Urology;  Laterality: N/A;     66    SEEDS IMPLANTED  . REPAIR EXTENSOR TENDON Right 06/02/2016   Procedure: RIGHT LONG FINGER EXTENSOR TENDON REPAIR;  Surgeon: Milly Jakob, MD;  Location: [facility];  Service: Orthopedics;  Laterality: Right;  . TOTAL KNEE ARTHROPLASTY Right 2010  approx.  . TRANSTHORACIC ECHOCARDIOGRAM  11/28/2015   mild focal basal hypertrophy  of septum, ef 50-55%, possible hypokinesis of mid inferoseptal and apical septal myocardium, grade 1 diastolic dysfunction/  trivial PR        Home Medications    Prior to Admission medications   Medication Sig Start Date End Date Taking? Authorizing Provider  acetaminophen (TYLENOL) 500 MG tablet Take 1,000 mg by mouth daily as needed for moderate pain or headache.    [provider]  amLODipine (NORVASC) 10 MG tablet Take 1 tablet (10 mg total) by mouth daily. Patient taking differently: Take 10 mg by mouth every morning.  11/29/15   Kelvin Cellar, MD  aspirin 81 MG tablet Take 162 mg by mouth daily.     [provider]  atorvastatin (LIPITOR) 40 MG tablet Take 1 tablet (40 mg total) by mouth daily. Patient taking differently: Take 40 mg by mouth every morning.  11/28/15   Kelvin Cellar, MD  ciprofloxacin (CIPRO) 500 MG tablet Take 1 tablet (500 mg total) by mouth 2 (two) times daily. 01/12/17   Ward, Delice Bison, DO  hydrochlorothiazide (MICROZIDE) 12.5 MG capsule Take 12.5 mg by mouth every morning.     [provider]  HYDROcodone-acetaminophen (NORCO) 5-325 MG tablet Take 1 tablet by mouth every 6 (six) hours as needed for moderate pain. 10/15/16   [name], MD  tamsulosin (FLOMAX) 0.4 MG CAPS capsule Take 0.4 mg by mouth at bedtime.    [provider]    Family History Family History  Problem Relation Age of Onset  . Arrhythmia Mother        Has pacer  . Throat cancer Father   . Pancreatic cancer Sister   . Clotting disorder Sister        related knee surgeries  . Cancer Sister        pancreatic  . Sickle cell trait Son   . Cancer Maternal Aunt   . Cancer Maternal Uncle   . Cancer Other     Social History Social History   Tobacco Use  . Smoking status: Former Smoker    Packs/day: 1.00    Years: 30.00    Pack years: 30.00    Types: Cigarettes    Last attempt to quit: 06/01/2006    Years since quitting: 11.8  . Smokeless tobacco: Never Used  Substance Use Topics  . Alcohol use: Yes    Alcohol/week: 9.6 - 13.8 oz    Types: 14 - 21 Cans of beer, 2 Shots of liquor per week    Comment: per pt 10/13/16- 2-3 cans daily, shots 2 per weekend (per epic documentation (feb 2017) admitted to  2 pints spirits, 12 pk beer, and 1-2 40oz drink daily)  . Drug use: Yes    Types: "Crack" cocaine, Marijuana    Comment: 10/13/16  states no cocaine since before Christmas,, 2 marijuana weekly     Allergies   Morphine and related   Review of Systems Review of Systems  Constitutional: Negative for chills and fever.  Musculoskeletal: Positive for arthralgias, gait problem  (painful) and joint swelling.  Skin: Negative for color change, rash and wound.  Neurological: Negative for weakness and numbness.     Physical Exam Updated Vital Signs BP 112/82 (BP Location: Right Arm)   Pulse 94   Temp 98.2 F (36.8 C) (Oral)   Resp 20   SpO2 99%   Physical Exam  Constitutional: He appears well-developed and well-nourished. No distress.  Sitting at bedside in no apparent distress, nontoxic-appearing.  HENT:  Head: Normocephalic and atraumatic.  Eyes: Right eye exhibits no discharge. Left eye exhibits no discharge.  Pulmonary/Chest: Effort normal. No respiratory distress.  Musculoskeletal:  Right ankle with tenderness to palpation over the medial malleolus with mild swelling overlying. Full active dorsiflexion/plantarflexion, although painful. No pain with passive dorsiflexion or plantarflexion.  No erythema, warmth, ecchymosis, or deformity appreciated. No break in skin. No pain to fifth metatarsal area or navicular region. Achilles intact. Good pedal pulse and cap refill of toes. Sensation to light touch intact in the toes.  Neurological: He is alert. Coordination normal.  Skin: Skin is warm and dry. Capillary refill takes less than 2 seconds. He is not diaphoretic.  Psychiatric: He has a normal mood and affect. His behavior is normal.  Nursing note and vitals reviewed.    ED Treatments / Results  Labs (all labs ordered are listed, but only abnormal results are displayed) Labs Reviewed - No data to display  EKG None  Radiology Dg Ankle Complete Right  Result Date: 04/11/2018 CLINICAL DATA:  Progressive right ankle pain for 3 weeks. EXAM: RIGHT ANKLE - COMPLETE 3+ VIEW COMPARISON:  None. FINDINGS: There is no evidence of fracture or dislocation. There is an ankle effusion. There is no evidence of arthropathy or other focal bone abnormality. Vascular calcifications around the ankle. IMPRESSION: Ankle joint effusion. Bones appear normal. Arterial vascular  calcifications. Electronically Signed   By: Lorriane Shire M.D.   On: 04/11/2018 08:48    Procedures Procedures (including critical care time)  Medications Ordered in ED Medications  oxyCODONE-acetaminophen (PERCOCET/ROXICET) 5-325 MG per tablet 1 tablet (has no administration in time range)     Initial Impression / Assessment and Plan / ED Course  I have reviewed the triage vital signs and the nursing notes.  Pertinent labs & imaging results that were available during my care of the patient were reviewed by me and considered in my medical decision making (see chart for details).    X-ray right ankle reveals ankle joint effusion.  No acute bony abnormality.  Foot is neurovascularly intact on exam.  No erythema, warmth, fevers or pain with passive ROM and do not suspect septic joint at this time.  Patient will be discharged with ASO brace for comfort.  Have also discharged him with a short course of pain medication.  Have instructed him to follow-up with orthopedics if his pain is not improving.  Counseled him on reasons to return and he agrees.  Discussed this patient with Dr. Sherry Ruffing who agrees with plan to discharge home.  Final Clinical Impressions(s) / ED Diagnoses   Final diagnoses:  Acute right ankle pain    ED Discharge Orders        Ordered    oxyCODONE-acetaminophen (PERCOCET/ROXICET) 5-325 MG tablet  Every 6 hours PRN     04/11/18 0937       Glyn Ade, PA-C 04/11/18 0941    Tegeler, Gwenyth Allegra, MD 04/11/18 [id number]

## 2018-04-11 NOTE — ED Triage Notes (Signed)
Pt reports right ankle pain x3 weeks, denies any falls or injuries, states pain came on all of a sudden while he was standing up and doing dishes. No redness or swelling noted to ankle, pedal pulses intact. Pt states he cannot bear weight on ankle

## 2018-06-06 ENCOUNTER — Other Ambulatory Visit: Payer: Self-pay | Admitting: Urology

## 2018-06-07 NOTE — Patient Instructions (Addendum)
Bradley Ray  06/07/2018   Your procedure is scheduled on: 06/16/2018   Report to [facility] Main  Entrance   Report to admitting at   [place] AM    Call this number if you have problems the morning of surgery [phone]   Remember: Do not eat food or drink liquids :After Midnight.     Take these medicines the morning of surgery with A SIP OF WATER: Amlodipine ( Norvasc)                                 You may not have any metal on your body including hair pins and              piercings  Do not wear jewelry, , lotions, powders or perfumes, deodorant                          Men may shave face and neck.   Do not bring valuables to the hospital. [name].  Contacts, dentures or bridgework may not be worn into surgery.  Leave suitcase in the car. After surgery it may be brought to your room.                     Please read over the following fact sheets you were given: _____________________________________________________________________             [facility] - Preparing for Surgery Before surgery, you can play an important role.  Because skin is not sterile, your skin needs to be as free of germs as possible.  You can reduce the number of germs on your skin by washing with CHG (chlorahexidine gluconate) soap before surgery.  CHG is an antiseptic cleaner which kills germs and bonds with the skin to continue killing germs even after washing. Please DO NOT use if you have an allergy to CHG or antibacterial soaps.  If your skin becomes reddened/irritated stop using the CHG and inform your nurse when you arrive at Short Stay. Do not shave (including legs and underarms) for at least 48 hours prior to the first CHG shower.  You may shave your face/neck. Please follow these instructions carefully:  1.  Shower with CHG Soap the night before surgery and the  morning of Surgery.  2.  If you choose to wash your  hair, wash your hair first as usual with your  normal  shampoo.  3.  After you shampoo, rinse your hair and body thoroughly to remove the  shampoo.                           4.  Use CHG as you would any other liquid soap.  You can apply chg directly  to the skin and wash                       Gently with a scrungie or clean washcloth.  5.  Apply the CHG Soap to your body ONLY FROM THE NECK DOWN.   Do not use on face/ open  Wound or open sores. Avoid contact with eyes, ears mouth and genitals (private parts).                       Wash face,  Genitals (private parts) with your normal soap.             6.  Wash thoroughly, paying special attention to the area where your surgery  will be performed.  7.  Thoroughly rinse your body with warm water from the neck down.  8.  DO NOT shower/wash with your normal soap after using and rinsing off  the CHG Soap.                9.  Pat yourself dry with a clean towel.            10.  Wear clean pajamas.            11.  Place clean sheets on your bed the night of your first shower and do not  sleep with pets. Day of Surgery : Do not apply any lotions/deodorants the morning of surgery.  Please wear clean clothes to the hospital/surgery center.  FAILURE TO FOLLOW THESE INSTRUCTIONS MAY RESULT IN THE CANCELLATION OF YOUR SURGERY PATIENT SIGNATURE_________________________________  NURSE SIGNATURE__________________________________  ________________________________________________________________________

## 2018-06-08 ENCOUNTER — Encounter (HOSPITAL_COMMUNITY)
Admission: RE | Admit: 2018-06-08 | Discharge: 2018-06-08 | Disposition: A | Discharge status: Home / Self Care | Payer: Medicaid Other | Source: Ambulatory Visit | Attending: Urology | Admitting: Urology

## 2018-06-08 ENCOUNTER — Encounter (HOSPITAL_COMMUNITY): Payer: Self-pay

## 2018-06-08 ENCOUNTER — Other Ambulatory Visit: Payer: Self-pay

## 2018-06-08 DIAGNOSIS — Z01810 Encounter for preprocedural cardiovascular examination: Secondary | ICD-10-CM | POA: Diagnosis present

## 2018-06-08 DIAGNOSIS — Z01812 Encounter for preprocedural laboratory examination: Secondary | ICD-10-CM | POA: Insufficient documentation

## 2018-06-08 HISTORY — DX: Unspecified osteoarthritis, unspecified site: M19.90

## 2018-06-08 LAB — CBC
HEMATOCRIT: 37.6 % — AB (ref 39.0–52.0)
HEMOGLOBIN: 12.3 g/dL — AB (ref 13.0–17.0)
MCH: 20.7 pg — ABNORMAL LOW (ref 26.0–34.0)
MCHC: 32.7 g/dL (ref 30.0–36.0)
MCV: 63.4 fL — AB (ref 78.0–100.0)
Platelets: 236 10*3/uL (ref 150–400)
RBC: 5.93 MIL/uL — AB (ref 4.22–5.81)
RDW: 18.2 % — ABNORMAL HIGH (ref 11.5–15.5)
WBC: 5.5 10*3/uL (ref 4.0–10.5)

## 2018-06-08 LAB — BASIC METABOLIC PANEL
Anion gap: 11 (ref 5–15)
BUN: 10 mg/dL (ref 8–23)
CO2: 28 mmol/L (ref 22–32)
Calcium: 9.1 mg/dL (ref 8.9–10.3)
Chloride: 104 mmol/L (ref 98–111)
Creatinine, Ser: 0.86 mg/dL (ref 0.61–1.24)
GFR calc non Af Amer: >60 mL/min (ref >60)
Glucose, Bld: 113 mg/dL — ABNORMAL HIGH (ref 70–99)
POTASSIUM: 3.1 mmol/L — AB (ref 3.5–5.1)
SODIUM: 143 mmol/L (ref 135–145)

## 2018-06-08 NOTE — Progress Notes (Signed)
Bmp DONE 06-08-18 routed to Dr. Jeffie Pollock via epic.

## 2018-06-08 NOTE — Progress Notes (Signed)
Echo 2017 epic

## 2018-06-15 NOTE — H&P (Signed)
CC: I have prostate cancer.  HPI: Bradley Ray is a 62 year-old male established patient who is here evaluation for treatment of prostate cancer.  His prostate cancer was diagnosed 07/02/2016. His PSA at his time of diagnosis was 3.87. His most recent PSA is 0.13.   05/30/18. Bradley Ray returns today in f/u. He was seen on April 19, 2018 for an exacerbation of voiding symptoms. The tamsulosin was increased to 0.8mg  daily. He still has nocturia 2-3x with a slow intermittent stream with some dysuria. He feels he eventually empties. His IPSS is 22. He has had no hematuria. He has urgency and can have UUI.   He has a history of low risk prostate cancer that was treated with a seed implant on 10/15/16. His prostate was 15ml. His PSA has fallen further to 0.13. His course was complicated by prolonged retention and prostatitis and previously did CIC but not recently.   GU Hx: His prostate was 43ml in size. He had 4 cores with 5% Gleason 6 with 2 on the left and 2 on the right without clustering.       CC: AUA Questions Scoring.  HPI:     AUA Symptom Score: 50% of the time he has the sensation of not emptying his bladder completely when finished urinating. Less than 50% of the time he has to urinate again fewer than two hours after he has finished urinating. 50% of the time he has to start and stop again several times when he urinates. More than 50% of the time he finds it difficult to postpone urination. Almost always he has a weak urinary stream. Less than 50% of the time he has to push or strain to begin urination. He has to get up to urinate 3 times from the time he goes to bed until the time he gets up in the morning.   Calculated AUA Symptom Score: 22    ALLERGIES: Morphine Sulfate - Skin Rash    MEDICATIONS: Aspirin 81 mg tablet,chewable 1 tablet PO Daily  Hydrochlorothiazide 12.5 mg capsule 1 capsule PO Daily  Tamsulosin Hcl 0.4 mg capsule 2 capsule PO Q HS  Amlodipine Besylate 10 mg tablet 1  tablet PO Daily  Atorvastatin Calcium 40 mg tablet 1 tablet PO Daily     GU PSH: Complex cystometrogram, w/ void pressure and urethral pressure profile studies, any technique - 01/15/2017 Complex Uroflow - 01/15/2017, 12/16/2016, 07/02/2016 Cystoscopy - 12/16/2016 Emg surf Electrd - 01/15/2017 Intrabd voidng Press - 01/15/2017 Prostate Needle Biopsy - 07/02/2016 TRANSPERI NEEDLE PLACE, PROS - 2018      PSH Notes: left ankle surgery 6 screws 2 metal rods placed   NON-GU PSH: Surgical Pathology, Gross And Microscopic Examination For Prostate Needle - 07/02/2016 Total Knee Replacement, Right    GU PMH: History of prostate cancer - 04/19/2018, (Improving), His PSA has fallen nicely and I will get a repeat today and at f/u in 6 months. , - 11/02/2017 Straining on Urination - 04/19/2018 Urinary Urgency - 04/19/2018 Nocturia - 11/02/2017 Hemorrhagic cystitis - 08/17/2017 Urinary Retention (Improving), Bradley Ray is voiding to completion but he appears to have a minimally symptomatic UTI today. I will send a culture and treat accordingly. - 06/07/2017, - 02/24/2017 (Stable), He had persistent obstruction on UDS on 4/6. I am going to get a culture today and will have him return in a week. He will remove the foley in the morning and f/u later in the day for a PVR. He will hold the oxybutynin  prior to the foley removal. , - 02/10/2017 (Stable), He failed his voiding trial today and has a 3cm prostate with bilobar hyperplasia. His options are to replace the foley and give him a voiding trial in 3-4 weeks with urodynamics or consider a Urolift procedure. I have reviewed the risks of bleeding, infection, injury to adjacent structures, incontinence, strictures, encrustation, sexual/ejaculatory dysfunction, thrombotic events and anesthetic complications. He wanted to go ahead and schedule the Urolift but after review we found that it is not a covered benefit on Medicaid. I will put him on finasteride and reviewed the side effects. he will  return for urodynamics in 4 weeks with OV to follow. . A foley was replaced today. , - 12/16/2016, - 2018 Urinary Tract Inf, Unspec site - 06/07/2017 Urinary Frequency - 2018 Prostate Cancer, T1c Nx Mx Gleason 6 disease. - 07/29/2016 Elevated PSA - 07/02/2016, He has an elevated PSA with a benign exam. This was his first PSA. , - 2017 Bladder-neck stenosis/contracture, He has obstructive and irritative voiding symptoms. - 2017 Testicular atrophy, He has moderate left testicular atrophy. - 2017    NON-GU PMH: Arthritis Encounter for general adult medical examination without abnormal findings, Encounter for preventive health examination Hypercholesterolemia Hypertension    FAMILY HISTORY: Death - Father Hypertension - Mother Pacemaker Placement - Mother   SOCIAL HISTORY: Marital Status: Single Preferred Language: English; Ethnicity: Not Hispanic Or Latino; Race: Black or African American Current Smoking Status: Patient does not smoke anymore. Has not smoked since 05/12/2008.   Tobacco Use Assessment Completed: Used Tobacco in last 30 days? Does not drink caffeine. Patient's occupation is/was disabled.    REVIEW OF SYSTEMS:    GU Review Male:   Patient reports frequent urination and burning/ pain with urination. Patient denies hard to postpone urination, get up at night to urinate, leakage of urine, stream starts and stops, trouble starting your stream, have to strain to urinate , erection problems, and penile pain.  Gastrointestinal (Upper):   Patient denies nausea, vomiting, and indigestion/ heartburn.  Gastrointestinal (Lower):   Patient denies diarrhea and constipation.  Constitutional:   Patient denies fever, night sweats, weight loss, and fatigue.  Skin:   Patient denies skin rash/ lesion and itching.  Eyes:   Patient denies blurred vision and double vision.  Ears/ Nose/ Throat:   Patient denies sore throat and sinus problems.  Hematologic/Lymphatic:   Patient denies swollen glands  and easy bruising.  Cardiovascular:   Patient denies leg swelling and chest pains.  Respiratory:   Patient denies cough and shortness of breath.  Endocrine:   Patient denies excessive thirst.  Musculoskeletal:   Patient denies back pain and joint pain.  Neurological:   Patient denies headaches and dizziness.  Psychologic:   Patient denies anxiety and depression.   Notes: weak stream    VITAL SIGNS:      05/30/2018 12:29 PM  Weight 171 lb / 77.56 kg  BP 132/84 mmHg  Heart Rate 101 /min   MULTI-SYSTEM PHYSICAL EXAMINATION:    Constitutional: Well-nourished. No physical deformities. Normally developed. Good grooming.  Respiratory: No labored breathing, no use of accessory muscles. CTA  Cardiovascular: Normal temperature, RRR without murmur.     PAST DATA REVIEWED:  Source Of History:  Patient   05/23/18 11/02/17 07/08/17 05/18/16 04/15/16 12/13/15  PSA  Total PSA 0.13 ng/mL 0.61 ng/mL 0.67 ng/mL 3.87  3.69 ng/dl 6.48 ng/dl  Free PSA     0.93 ng/dl   % Free PSA  25 %     04/15/16  Hormones  Testosterone, Total 274.9 pg/dL    PROCEDURES:         Flexible Cystoscopy - 52000  Risks, benefits, and some of the potential complications of the procedure were discussed. 54ml of 2% lidocaine jelly was instilled intraurethrally.     Meatus:  Normal size. Normal location. Normal condition.  Urethra:  No strictures.  External Sphincter:  Normal.  Verumontanum:  Normal.  Prostate:  Obstructing. Enlarged median lobe. Mild hyperplasia. The lateral lobes a small but there is an obstructing, ball valving middle lobe.   Bladder Neck:  Non-obstructing.  Ureteral Orifices:  Normal location. Normal size. Normal shape. Effluxed clear urine.  Bladder:  Mild trabeculation. No tumors. Normal mucosa. No stones.      The procedure was well tolerated and there were no complications.        Flow Rate - 51741  Peak Flow Rate: 2 cc/sec  Total Void Time: 2:12 min:sec  Flow Time: 0:37 min:sec   Voided Volume: 176 cc  Average Flow Rate: 4 cc/sec  Time of Peak Flow: 0:34 min:sec         Urinalysis Dipstick Dipstick Cont'd  Color: Yellow Bilirubin: Neg mg/dL  Appearance: Clear Ketones: Neg mg/dL  Specific Gravity: 1.025 Blood: Neg ery/uL  pH: 6.0 Protein: Neg mg/dL  Glucose: Neg mg/dL Urobilinogen: 0.2 mg/dL    Nitrites: Neg    Leukocyte Esterase: Neg leu/uL    ASSESSMENT:      ICD-10 Details  1 GU:   History of prostate cancer - Z85.46 His PSA continues to fall.   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 I have discussed options and will get him set up for a TURP of the middle lobe. I reviewd the risks of a TURP including bleeding, infection, incontinence, stricture, need for secondary procedures, ejaculatory and erectile dysfunction, thrombotic events, fluid overload and anesthetic complications. I explained that 95% of men will have relief of the obstructive symptoms and about 70% will have relief of the irritative symptoms.   3   Weak Urinary Stream - R39.12   4   Dysuria - R30.0    PLAN:           Schedule Return Visit/Planned Activity: Next Available Appointment - Schedule Surgery          Document Letter(s):  Created for Patient: Clinical Summary

## 2018-06-16 ENCOUNTER — Ambulatory Visit (HOSPITAL_COMMUNITY): Payer: Medicaid Other | Admitting: Registered Nurse

## 2018-06-16 ENCOUNTER — Other Ambulatory Visit: Payer: Self-pay

## 2018-06-16 ENCOUNTER — Encounter (HOSPITAL_COMMUNITY): Payer: Self-pay | Admitting: *Deleted

## 2018-06-16 ENCOUNTER — Encounter (HOSPITAL_COMMUNITY)
Admission: RE | Disposition: A | Discharge status: Home / Self Care | Payer: Self-pay | Source: Ambulatory Visit | Attending: Urology

## 2018-06-16 ENCOUNTER — Observation Stay (HOSPITAL_COMMUNITY)
Admission: RE | Admit: 2018-06-16 | Discharge: 2018-06-17 | Disposition: A | Discharge status: Home / Self Care | Payer: Medicaid Other | Source: Ambulatory Visit | Attending: Urology | Admitting: Urology

## 2018-06-16 DIAGNOSIS — Z8546 Personal history of malignant neoplasm of prostate: Secondary | ICD-10-CM | POA: Insufficient documentation

## 2018-06-16 DIAGNOSIS — I10 Essential (primary) hypertension: Secondary | ICD-10-CM | POA: Diagnosis not present

## 2018-06-16 DIAGNOSIS — Z79899 Other long term (current) drug therapy: Secondary | ICD-10-CM | POA: Diagnosis not present

## 2018-06-16 DIAGNOSIS — Z7982 Long term (current) use of aspirin: Secondary | ICD-10-CM | POA: Diagnosis not present

## 2018-06-16 DIAGNOSIS — Z23 Encounter for immunization: Secondary | ICD-10-CM | POA: Insufficient documentation

## 2018-06-16 DIAGNOSIS — N138 Other obstructive and reflux uropathy: Secondary | ICD-10-CM | POA: Insufficient documentation

## 2018-06-16 DIAGNOSIS — Z96651 Presence of right artificial knee joint: Secondary | ICD-10-CM | POA: Diagnosis not present

## 2018-06-16 DIAGNOSIS — N401 Enlarged prostate with lower urinary tract symptoms: Principal | ICD-10-CM | POA: Insufficient documentation

## 2018-06-16 DIAGNOSIS — N320 Bladder-neck obstruction: Secondary | ICD-10-CM | POA: Diagnosis not present

## 2018-06-16 DIAGNOSIS — R300 Dysuria: Secondary | ICD-10-CM | POA: Diagnosis not present

## 2018-06-16 DIAGNOSIS — R3912 Poor urinary stream: Secondary | ICD-10-CM | POA: Diagnosis not present

## 2018-06-16 DIAGNOSIS — E7800 Pure hypercholesterolemia, unspecified: Secondary | ICD-10-CM | POA: Insufficient documentation

## 2018-06-16 DIAGNOSIS — Z87891 Personal history of nicotine dependence: Secondary | ICD-10-CM | POA: Diagnosis not present

## 2018-06-16 DIAGNOSIS — N4289 Other specified disorders of prostate: Secondary | ICD-10-CM | POA: Diagnosis not present

## 2018-06-16 DIAGNOSIS — Z885 Allergy status to narcotic agent status: Secondary | ICD-10-CM | POA: Diagnosis not present

## 2018-06-16 HISTORY — PX: TRANSURETHRAL RESECTION OF PROSTATE: SHX73

## 2018-06-16 LAB — RAPID URINE DRUG SCREEN, HOSP PERFORMED
Amphetamines: NOT DETECTED
BARBITURATES: NOT DETECTED
Benzodiazepines: NOT DETECTED
COCAINE: NOT DETECTED
OPIATES: NOT DETECTED
TETRAHYDROCANNABINOL: POSITIVE — AB

## 2018-06-16 SURGERY — TURP (TRANSURETHRAL RESECTION OF PROSTATE)
Anesthesia: General

## 2018-06-16 MED ORDER — HYDROMORPHONE HCL 1 MG/ML IJ SOLN
0.5000 mg | INTRAMUSCULAR | Status: DC | PRN
  Administered 2018-06-16: 1 mg via INTRAVENOUS
  Filled 2018-06-16: qty 1

## 2018-06-16 MED ORDER — DEXAMETHASONE SODIUM PHOSPHATE 10 MG/ML IJ SOLN
INTRAMUSCULAR | Status: DC | PRN
  Administered 2018-06-16: 8 mg via INTRAVENOUS

## 2018-06-16 MED ORDER — SENNOSIDES-DOCUSATE SODIUM 8.6-50 MG PO TABS: 1.0000 | ORAL_TABLET | Freq: Every evening | ORAL | Status: DC | PRN

## 2018-06-16 MED ORDER — ONDANSETRON HCL 4 MG/2ML IJ SOLN
INTRAMUSCULAR | Status: DC | PRN
  Administered 2018-06-16: 4 mg via INTRAVENOUS

## 2018-06-16 MED ORDER — LACTATED RINGERS IV SOLN
INTRAVENOUS | Status: DC
  Administered 2018-06-16: 07:00:00 via INTRAVENOUS

## 2018-06-16 MED ORDER — ZOLPIDEM TARTRATE 5 MG PO TABS: 5.0000 mg | ORAL_TABLET | Freq: Every evening | ORAL | Status: DC | PRN

## 2018-06-16 MED ORDER — DEXAMETHASONE SODIUM PHOSPHATE 10 MG/ML IJ SOLN
INTRAMUSCULAR | Status: AC
  Filled 2018-06-16: qty 1

## 2018-06-16 MED ORDER — FENTANYL CITRATE (PF) 100 MCG/2ML IJ SOLN
INTRAMUSCULAR | Status: AC
  Administered 2018-06-16: 50 ug via INTRAVENOUS
  Filled 2018-06-16: qty 2

## 2018-06-16 MED ORDER — BISACODYL 10 MG RE SUPP: 10.0000 mg | Freq: Every day | RECTAL | Status: DC | PRN

## 2018-06-16 MED ORDER — LIDOCAINE 2% (20 MG/ML) 5 ML SYRINGE
INTRAMUSCULAR | Status: AC
  Filled 2018-06-16: qty 5

## 2018-06-16 MED ORDER — ONDANSETRON HCL 4 MG/2ML IJ SOLN: 4.0000 mg | INTRAMUSCULAR | Status: DC | PRN

## 2018-06-16 MED ORDER — FENTANYL CITRATE (PF) 100 MCG/2ML IJ SOLN
INTRAMUSCULAR | Status: AC
  Filled 2018-06-16: qty 2

## 2018-06-16 MED ORDER — AMLODIPINE BESYLATE 10 MG PO TABS
10.0000 mg | ORAL_TABLET | Freq: Every day | ORAL | Status: DC
  Administered 2018-06-17: 10 mg via ORAL
  Filled 2018-06-16: qty 1

## 2018-06-16 MED ORDER — HYOSCYAMINE SULFATE 0.125 MG SL SUBL
0.1250 mg | SUBLINGUAL_TABLET | SUBLINGUAL | Status: DC | PRN
  Administered 2018-06-16: 0.125 mg via SUBLINGUAL
  Filled 2018-06-16 (×2): qty 1

## 2018-06-16 MED ORDER — EPHEDRINE SULFATE-NACL 50-0.9 MG/10ML-% IV SOSY
PREFILLED_SYRINGE | INTRAVENOUS | Status: DC | PRN
  Administered 2018-06-16: 10 mg via INTRAVENOUS

## 2018-06-16 MED ORDER — HYDROCHLOROTHIAZIDE 12.5 MG PO CAPS
12.5000 mg | ORAL_CAPSULE | Freq: Every day | ORAL | Status: DC
  Administered 2018-06-16 – 2018-06-17 (×2): 12.5 mg via ORAL
  Filled 2018-06-16 (×2): qty 1

## 2018-06-16 MED ORDER — PNEUMOCOCCAL VAC POLYVALENT 25 MCG/0.5ML IJ INJ
0.5000 mL | INJECTION | INTRAMUSCULAR | Status: AC
  Administered 2018-06-17: 0.5 mL via INTRAMUSCULAR
  Filled 2018-06-16: qty 0.5

## 2018-06-16 MED ORDER — FENTANYL CITRATE (PF) 100 MCG/2ML IJ SOLN
INTRAMUSCULAR | Status: DC | PRN
  Administered 2018-06-16: 50 ug via INTRAVENOUS
  Administered 2018-06-16 (×2): 25 ug via INTRAVENOUS
  Administered 2018-06-16: 50 ug via INTRAVENOUS
  Administered 2018-06-16 (×2): 25 ug via INTRAVENOUS

## 2018-06-16 MED ORDER — MIDAZOLAM HCL 5 MG/5ML IJ SOLN
INTRAMUSCULAR | Status: DC | PRN
  Administered 2018-06-16: 2 mg via INTRAVENOUS

## 2018-06-16 MED ORDER — POTASSIUM CHLORIDE IN NACL 20-0.45 MEQ/L-% IV SOLN
INTRAVENOUS | Status: DC
  Administered 2018-06-16 – 2018-06-17 (×3): via INTRAVENOUS
  Filled 2018-06-16 (×4): qty 1000

## 2018-06-16 MED ORDER — LIDOCAINE 2% (20 MG/ML) 5 ML SYRINGE
INTRAMUSCULAR | Status: DC | PRN
  Administered 2018-06-16: 80 mg via INTRAVENOUS

## 2018-06-16 MED ORDER — SODIUM CHLORIDE 0.9 % IR SOLN
Status: DC | PRN
  Administered 2018-06-16: 9000 mL

## 2018-06-16 MED ORDER — ATORVASTATIN CALCIUM 40 MG PO TABS
40.0000 mg | ORAL_TABLET | Freq: Every day | ORAL | Status: DC
  Administered 2018-06-16 – 2018-06-17 (×2): 40 mg via ORAL
  Filled 2018-06-16 (×2): qty 1

## 2018-06-16 MED ORDER — PROMETHAZINE HCL 25 MG/ML IJ SOLN: 6.2500 mg | INTRAMUSCULAR | Status: DC | PRN

## 2018-06-16 MED ORDER — PROPOFOL 10 MG/ML IV BOLUS
INTRAVENOUS | Status: DC | PRN
  Administered 2018-06-16: 200 mg via INTRAVENOUS

## 2018-06-16 MED ORDER — ONDANSETRON HCL 4 MG/2ML IJ SOLN
INTRAMUSCULAR | Status: AC
  Filled 2018-06-16: qty 2

## 2018-06-16 MED ORDER — ACETAMINOPHEN 325 MG PO TABS: 650.0000 mg | ORAL_TABLET | ORAL | Status: DC | PRN

## 2018-06-16 MED ORDER — CEFAZOLIN SODIUM-DEXTROSE 2-4 GM/100ML-% IV SOLN
2.0000 g | INTRAVENOUS | Status: AC
  Administered 2018-06-16: 2 g via INTRAVENOUS
  Filled 2018-06-16: qty 100

## 2018-06-16 MED ORDER — SODIUM CHLORIDE 0.9 % IR SOLN
3000.0000 mL | Status: DC
  Administered 2018-06-16: 3000 mL

## 2018-06-16 MED ORDER — FENTANYL CITRATE (PF) 100 MCG/2ML IJ SOLN
25.0000 ug | INTRAMUSCULAR | Status: DC | PRN
  Administered 2018-06-16 (×2): 50 ug via INTRAVENOUS

## 2018-06-16 MED ORDER — FLEET ENEMA 7-19 GM/118ML RE ENEM: 1.0000 | ENEMA | Freq: Once | RECTAL | Status: DC | PRN

## 2018-06-16 MED ORDER — OXYCODONE HCL 5 MG PO TABS: 5.0000 mg | ORAL_TABLET | ORAL | Status: DC | PRN

## 2018-06-16 MED ORDER — PROPOFOL 10 MG/ML IV BOLUS
INTRAVENOUS | Status: AC
  Filled 2018-06-16: qty 20

## 2018-06-16 MED ORDER — MIDAZOLAM HCL 2 MG/2ML IJ SOLN
INTRAMUSCULAR | Status: AC
  Filled 2018-06-16: qty 2

## 2018-06-16 SURGICAL SUPPLY — 15 items
BAG URINE DRAINAGE (UROLOGICAL SUPPLIES) ×2 IMPLANT
BAG URO CATCHER STRL LF (MISCELLANEOUS) ×2 IMPLANT
CATH FOLEY 3WAY 30CC 22FR (CATHETERS) ×2 IMPLANT
ELECT REM PT RETURN 15FT ADLT (MISCELLANEOUS) IMPLANT
GLOVE SURG SS PI 8.0 STRL IVOR (GLOVE) ×2 IMPLANT
GOWN STRL REUS W/TWL XL LVL3 (GOWN DISPOSABLE) ×2 IMPLANT
HOLDER FOLEY CATH W/STRAP (MISCELLANEOUS) ×2 IMPLANT
LOOP CUT BIPOLAR 24F LRG (ELECTROSURGICAL) ×2 IMPLANT
MANIFOLD NEPTUNE II (INSTRUMENTS) ×2 IMPLANT
PACK CYSTO (CUSTOM PROCEDURE TRAY) ×2 IMPLANT
SET ASPIRATION TUBING (TUBING) IMPLANT
SYR 30ML LL (SYRINGE) ×2 IMPLANT
SYRINGE IRR TOOMEY STRL 70CC (SYRINGE) ×2 IMPLANT
TUBING CONNECTING 10 (TUBING) ×2 IMPLANT
TUBING UROLOGY SET (TUBING) ×2 IMPLANT

## 2018-06-16 NOTE — Anesthesia Postprocedure Evaluation (Signed)
Anesthesia Post Note  Patient: Bradley Ray  Procedure(s) Performed: TRANSURETHRAL RESECTION OF THE PROSTATE (TURP) (N/A )     Patient location during evaluation: PACU Anesthesia Type: General Level of consciousness: awake and alert Pain management: pain level controlled Vital Signs Assessment: post-procedure vital signs reviewed and stable Respiratory status: spontaneous breathing, nonlabored ventilation, respiratory function stable and patient connected to nasal cannula oxygen Cardiovascular status: blood pressure returned to baseline and stable Postop Assessment: no apparent nausea or vomiting Anesthetic complications: no    Last Vitals:  Vitals:   06/16/18 1013 06/16/18 1424  BP: 139/90 125/80  Pulse: 96 (!) 105  Resp: 14 16  Temp: 36.5 C 36.7 C  SpO2: 96% 94%    Last Pain:  Vitals:   06/16/18 1424  TempSrc: Oral  PainSc:                  Ranger Petrich S

## 2018-06-16 NOTE — Progress Notes (Signed)
Patient ID: Bradley Ray, male   DOB: 03-Oct-1956, 62 y.o.   MRN: [id number]  [name] is doing well post TURP.   Urine is clear.  .BP 125/80 (BP Location: Right Arm)   Pulse (!) 105   Temp 98.1 F (36.7 C) (Oral)   Resp 16   Ht 5\' 5"  (1.651 m)   Wt 78.8 kg   SpO2 94%   BMI 28.91 kg/m   He will have a voiding trial in the AM and can be discharged when voiding.

## 2018-06-16 NOTE — Anesthesia Procedure Notes (Signed)
Procedure Name: LMA Insertion Date/Time: 06/16/2018 8:37 AM Performed by: Victoriano Lain, CRNA Pre-anesthesia Checklist: Patient identified, Emergency Drugs available, Suction available, Patient being monitored and Timeout performed Patient Re-evaluated:Patient Re-evaluated prior to induction Oxygen Delivery Method: Circle system utilized Preoxygenation: Pre-oxygenation with 100% oxygen Induction Type: IV induction LMA: LMA with gastric port inserted LMA Size: 4.0 Number of attempts: 1 Placement Confirmation: positive ETCO2 and breath sounds checked- equal and bilateral Tube secured with: Tape Dental Injury: Teeth and Oropharynx as per pre-operative assessment

## 2018-06-16 NOTE — Transfer of Care (Signed)
Immediate Anesthesia Transfer of Care Note  Patient: Bradley Ray  Procedure(s) Performed: TRANSURETHRAL RESECTION OF THE PROSTATE (TURP) (N/A )  Patient Location: PACU  Anesthesia Type:General  Level of Consciousness: awake, alert , oriented and patient cooperative  Airway & Oxygen Therapy: Patient Spontanous Breathing and Patient connected to face mask oxygen  Post-op Assessment: Report given to RN, Post -op Vital signs reviewed and stable and Patient moving all extremities  Post vital signs: Reviewed and stable  Last Vitals:  Vitals Value Taken Time  BP    Temp    Pulse 101 06/16/2018  9:23 AM  Resp 20 06/16/2018  9:24 AM  SpO2 97 % 06/16/2018  9:23 AM  Vitals shown include unvalidated device data.  Last Pain:  Vitals:   06/16/18 0716  TempSrc: Oral      Patients Stated Pain Goal: 3 (95/74/73 4037)  Complications: No apparent anesthesia complications

## 2018-06-16 NOTE — Interval H&P Note (Signed)
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 After consideration of risks, benefits and other options for treatment, the patient has consented to  Procedure(s): TRANSURETHRAL RESECTION OF THE PROSTATE (TURP) (N/A) as a surgical intervention .  The patient's history has been reviewed, patient examined, no change in status, stable for surgery.  I have reviewed the patient's chart and labs.  Questions were answered to the patient's satisfaction.     [name]

## 2018-06-16 NOTE — Progress Notes (Signed)
4th floor aware pt will in 1418 in 20 minutes

## 2018-06-16 NOTE — Anesthesia Preprocedure Evaluation (Signed)
Anesthesia Evaluation  Patient identified by MRN, date of birth, ID band Patient awake    Reviewed: Allergy & Precautions, NPO status , Patient's Chart, lab work & pertinent test results  Airway Mallampati: II  TM Distance: >3 FB Neck ROM: Full    Dental no notable dental hx.    Pulmonary neg pulmonary ROS, former smoker,    Pulmonary exam normal breath sounds clear to auscultation       Cardiovascular hypertension, Normal cardiovascular exam Rhythm:Regular Rate:Normal     Neuro/Psych negative neurological ROS  negative psych ROS   GI/Hepatic negative GI ROS, (+)     substance abuse  alcohol use,   Endo/Other  negative endocrine ROS  Renal/GU negative Renal ROS  negative genitourinary   Musculoskeletal negative musculoskeletal ROS (+)   Abdominal   Peds negative pediatric ROS (+)  Hematology negative hematology ROS (+)   Anesthesia Other Findings   Reproductive/Obstetrics negative OB ROS                             Anesthesia Physical Anesthesia Plan  ASA: III  Anesthesia Plan: General   Post-op Pain Management:    Induction: Intravenous  PONV Risk Score and Plan: 2 and Ondansetron, Dexamethasone and Treatment may vary due to age or medical condition  Airway Management Planned: LMA  Additional Equipment:   Intra-op Plan:   Post-operative Plan: Extubation in OR  Informed Consent: I have reviewed the patients History and Physical, chart, labs and discussed the procedure including the risks, benefits and alternatives for the proposed anesthesia with the patient or authorized representative who has indicated his/her understanding and acceptance.   Dental advisory given  Plan Discussed with: CRNA and Surgeon  Anesthesia Plan Comments:         Anesthesia Quick Evaluation

## 2018-06-16 NOTE — Op Note (Signed)
Preoperative diagnosis: 1. Bladder outlet obstruction secondary to BPH with history of prostate cancer and a seed implant  Postoperative diagnosis:  2. Bladder outlet obstruction secondary to BPH with history of prostate cancer and a seed implant  Procedure:  1. Cystoscopy 2. Transurethral Resection of the prostate  Surgeon: [name]. M.D.  Anesthesia: general  Complications: None  EBL: minimal   Specimens: 1. Prostate chips and seeds  Disposition of specimens: Pathology  Indication: Bradley Ray is a patient with bladder outlet obstruction secondary to benign prostatic hyperplasia.  He also has a history of prostate cancer treated with a seed implant.  He has a ball valving middle lobe with obstruction and has failed medical therapy.   After reviewing the management options for treatment, he elected to proceed with the above surgical procedure(s).  I will perform a limited TURP because of the prior radiation. We have discussed the potential benefits and risks of the procedure, side effects of the proposed treatment, the likelihood of the patient achieving the goals of the procedure, and any potential problems that might occur during the procedure or recuperation. Informed consent has been obtained.  Description of procedure:  The patient was taken to the operating room and general  anesthesia was induced.  The patient was placed in the dorsal lithotomy position, prepped and draped in the usual sterile fashion, and preoperative antibiotics were administered. A preoperative time-out was performed.   Cystourethroscopy was performed.  The patient's urethra was examined and he has a 3cm prostatic urethra with minimal lateral lobe enlargement but an obstructing middle lobe.  There is some blanching of the mucosa from radiation effect.    The bladder was then systematically examined in its entirety. There was moderate  trabeculation with no evidence of any bladder tumors or stones.   There was some radiation neovascularity at the bladder neck.  The ureteral orifices were identified and marked so as to be avoided during the procedure.  The prostate adenoma was then resected utilizing loop cautery resection with the bipolar cutting loop.  The middle lobe was resected to bladder neck fibers and the resection was then carried along the floor  back to the verumontanum.  Some resection of the lateral lobes was performed to create a smooth channel but the anterior lateral lobes and anterior prostate was avoided.  Several seeds were unroofed and removed during the resection. Care was taken not to resect distal to the verumontanum.  At the completion of the procedure the bladder was evacuated free of chips and seeds and hemostasis was insured.  Final inspection revealed intact ureteral orifices, a widely patent TUR channel and an intact external sphincter.   Hemostasis was then achieved with the cautery and the bladder was emptied and reinspected with no significant bleeding noted at the end of the procedure.  The scope was removed and pressure on the bladder produced a good stream.    A 68fr 3 way catheter was then placed into the bladder with the aid of a catheter guide and placed on continuous bladder irrigation after hand irrigation returned clear.  The patient appeared to tolerate the procedure well and without complications.  The patient was able to be awakened and transferred to the recovery unit in satisfactory condition.

## 2018-06-16 NOTE — Discharge Instructions (Signed)
Transurethral Resection of the Prostate, Care After °Refer to this sheet in the next few weeks. These instructions provide you with information about caring for yourself after your procedure. Your health care provider may also give you more specific instructions. Your treatment has been planned according to current medical practices, but problems sometimes occur. Call your health care provider if you have any problems or questions after your procedure. °What can I expect after the procedure? °After the procedure, it is common to have: °· Mild pain in your lower abdomen. °· Soreness or mild discomfort in your penis from having the catheter inserted during the procedure. °· A feeling of urgency when you need to urinate. °· A small amount of blood in your urine. You may notice some small blood clots in your urine. These are normal. ° °Follow these instructions at home: °Medicines ° °· Take over-the-counter and prescription medicines only as told by your health care provider. °· Do not drive or operate heavy machinery while taking prescription pain medicine. °· Do not drive for 24 hours if you received a sedative. °· If you were prescribed antibiotic medicine, take it as told by your health care provider. Do not stop taking the antibiotic even if you start to feel better. °Activity °· Return to your normal activities as told by your health care provider. Ask your health care provider what activities are safe for you. °· Do not lift anything that is heavier than 10 lb (4.5 kg) for 3 weeks after your procedure, or as long as told by your health care provider. °· Avoid intense physical activity for as long as told by your health care provider. °· Walk at least one time every day. This helps to prevent blood clots. You may increase your physical activity gradually as you start to feel better. °Lifestyle °· Do not drink alcohol for as long as told by your health care provider. This is especially important if you are taking  prescription pain medicines. °· Do not engage in sexual activity until your health care provider says that you can do this. °General instructions °· Do not take baths, swim, or use a hot tub until your health care provider approves. °· Drink enough fluid to keep your urine clear or pale yellow. °· Urinate as soon as you feel the need to. Do not try to hold your urine for long periods of time. °· If your health care provider approves, you may take a stool softener for 2-3 weeks to prevent you from straining to have a bowel movement. °· Wear compression stockings as told by your health care provider. These stockings help to prevent blood clots and reduce swelling in your legs. °· Keep all follow-up visits as told by your health care provider. This is important. °Contact a health care provider if: °· You have difficulty urinating. °· You have a fever. °· You have pain that gets worse or does not improve with medicine. °· You have blood in your urine that does not go away after 1 week of resting and drinking more fluids. °· You have swelling in your penis or testicles. °Get help right away if: °· You are unable to urinate. °· You are having more blood clots in your urine instead of fewer. °· You have: °? Large blood clots. °? A lot of blood in your urine. °? Pain in your back or lower abdomen. °? Pain or swelling in your legs. °? Chills and you are shaking. °This information is not intended to   replace advice given to you by your health care provider. Make sure you discuss any questions you have with your health care provider. °Document Released: 09/28/2005 Document Revised: 05/31/2016 Document Reviewed: 06/20/2015 °[employer] Interactive Patient Education © 2017 [employer] ° °

## 2018-06-17 ENCOUNTER — Encounter (HOSPITAL_COMMUNITY): Payer: Self-pay | Admitting: Urology

## 2018-06-17 DIAGNOSIS — N401 Enlarged prostate with lower urinary tract symptoms: Secondary | ICD-10-CM | POA: Diagnosis not present

## 2018-06-17 LAB — HIV ANTIBODY (ROUTINE TESTING W REFLEX): HIV SCREEN 4TH GENERATION: NONREACTIVE

## 2018-06-17 NOTE — Discharge Summary (Signed)
Patient ID: Bradley Ray MRN: [id number] DOB/AGE: [birth year] 62 y.o.  Admit date: 06/16/2018 Discharge date: 06/17/2018  Primary Care Physician:  Nolene Ebbs, MD  Discharge Diagnoses:  N40.1 Present on Admission: . BPH with urinary obstruction   Consults:  None   Discharge Medications: Allergies as of 06/17/2018      Reactions   Morphine And Related Rash      Medication List    TAKE these medications   acetaminophen 500 MG tablet Commonly known as:  TYLENOL Take 1,000 mg by mouth daily as needed for moderate pain or headache.   amLODipine 10 MG tablet Commonly known as:  NORVASC Take 1 tablet (10 mg total) by mouth daily. What changed:  when to take this   aspirin 81 MG tablet Take 162 mg by mouth daily.   atorvastatin 40 MG tablet Commonly known as:  LIPITOR Take 1 tablet (40 mg total) by mouth daily. What changed:  when to take this   hydrochlorothiazide 12.5 MG capsule Commonly known as:  MICROZIDE Take 12.5 mg by mouth every morning.   oxyCODONE-acetaminophen 5-325 MG tablet Commonly known as:  PERCOCET/ROXICET Take 1 tablet by mouth every 6 (six) hours as needed for severe pain.        Significant Diagnostic Studies:  No results found.  Brief H and P: For complete details please refer to admission H and P, but in brief the pt is admitted for TURP for mgmt of BPH with obstruction.  Hospital Course: No intraoperative or postoperative issues. He voided adequately following catheter tremoval and was discharged on POD 1 Active Problems:   BPH with urinary obstruction   Day of Discharge BP 122/80 (BP Location: Left Arm)   Pulse 79   Temp 97.6 F (36.4 C) (Oral)   Resp 18   Ht 5\' 5"  (1.651 m)   Wt 78.8 kg   SpO2 94%   BMI 28.91 kg/m   Results for orders placed or performed during the hospital encounter of 06/16/18 (from the past 24 hour(s))  HIV antibody (Routine Testing)     Status: None   Collection Time: 06/16/18 10:31 AM  Result Value  Ref Range   HIV Screen 4th Generation wRfx Non Reactive Non Reactive    Physical Exam: General: Alert and awake oriented x3 not in any acute distress. HEENT: anicteric sclera, pupils reactive to light and accommodation CVS: S1-S2 clear no murmur rubs or gallops Chest: clear to auscultation bilaterally, no wheezing rales or rhonchi Abdomen: soft nontender, nondistended, normal bowel sounds, no organomegaly Extremities: no cyanosis, clubbing or edema noted bilaterally Neuro: Cranial nerves II-XII intact, no focal neurological deficits  Disposition:  Home  Diet:  Regular  Activity:  Discussed with pt   Disposition and Follow-up:    Followup arranged  TESTS THAT NEED FOLLOW-UP  Path review  DISCHARGE FOLLOW-UP Follow-up Information    Karen Kays, NP On 07/06/2018.   Specialty:  Nurse Practitioner Why:  11am Contact information: [address] [place] [address] [phone]           Time spent on Discharge:  10 mins  Signed: Jorja Loa 06/17/2018, 7:38 AM

## 2018-12-11 DIAGNOSIS — Z87448 Personal history of other diseases of urinary system: Secondary | ICD-10-CM

## 2018-12-11 HISTORY — DX: Personal history of other diseases of urinary system: Z87.448

## 2019-01-06 ENCOUNTER — Emergency Department (HOSPITAL_COMMUNITY): Payer: Medicaid Other

## 2019-01-06 ENCOUNTER — Encounter (HOSPITAL_COMMUNITY): Payer: Self-pay | Admitting: Emergency Medicine

## 2019-01-06 ENCOUNTER — Inpatient Hospital Stay (HOSPITAL_COMMUNITY)
Admission: EM | Admit: 2019-01-06 | Discharge: 2019-01-18 | DRG: 441 | Disposition: A | Discharge status: Home / Self Care | Payer: Medicaid Other | Attending: Internal Medicine | Admitting: Internal Medicine

## 2019-01-06 ENCOUNTER — Other Ambulatory Visit: Payer: Self-pay

## 2019-01-06 DIAGNOSIS — I130 Hypertensive heart and chronic kidney disease with heart failure and stage 1 through stage 4 chronic kidney disease, or unspecified chronic kidney disease: Secondary | ICD-10-CM | POA: Diagnosis present

## 2019-01-06 DIAGNOSIS — D696 Thrombocytopenia, unspecified: Secondary | ICD-10-CM | POA: Diagnosis not present

## 2019-01-06 DIAGNOSIS — N138 Other obstructive and reflux uropathy: Secondary | ICD-10-CM | POA: Diagnosis present

## 2019-01-06 DIAGNOSIS — I5032 Chronic diastolic (congestive) heart failure: Secondary | ICD-10-CM | POA: Diagnosis present

## 2019-01-06 DIAGNOSIS — R34 Anuria and oliguria: Secondary | ICD-10-CM | POA: Diagnosis not present

## 2019-01-06 DIAGNOSIS — K7200 Acute and subacute hepatic failure without coma: Principal | ICD-10-CM | POA: Diagnosis present

## 2019-01-06 DIAGNOSIS — Z800 Family history of malignant neoplasm of digestive organs: Secondary | ICD-10-CM

## 2019-01-06 DIAGNOSIS — F1290 Cannabis use, unspecified, uncomplicated: Secondary | ICD-10-CM | POA: Diagnosis present

## 2019-01-06 DIAGNOSIS — R079 Chest pain, unspecified: Secondary | ICD-10-CM | POA: Diagnosis present

## 2019-01-06 DIAGNOSIS — F1020 Alcohol dependence, uncomplicated: Secondary | ICD-10-CM | POA: Diagnosis present

## 2019-01-06 DIAGNOSIS — K824 Cholesterolosis of gallbladder: Secondary | ICD-10-CM | POA: Diagnosis present

## 2019-01-06 DIAGNOSIS — M25571 Pain in right ankle and joints of right foot: Secondary | ICD-10-CM | POA: Diagnosis not present

## 2019-01-06 DIAGNOSIS — Z808 Family history of malignant neoplasm of other organs or systems: Secondary | ICD-10-CM

## 2019-01-06 DIAGNOSIS — E781 Pure hyperglyceridemia: Secondary | ICD-10-CM | POA: Diagnosis present

## 2019-01-06 DIAGNOSIS — N183 Chronic kidney disease, stage 3 (moderate): Secondary | ICD-10-CM | POA: Diagnosis present

## 2019-01-06 DIAGNOSIS — E876 Hypokalemia: Secondary | ICD-10-CM | POA: Diagnosis present

## 2019-01-06 DIAGNOSIS — Z8249 Family history of ischemic heart disease and other diseases of the circulatory system: Secondary | ICD-10-CM

## 2019-01-06 DIAGNOSIS — Z96651 Presence of right artificial knee joint: Secondary | ICD-10-CM | POA: Diagnosis present

## 2019-01-06 DIAGNOSIS — R319 Hematuria, unspecified: Secondary | ICD-10-CM | POA: Diagnosis not present

## 2019-01-06 DIAGNOSIS — Z832 Family history of diseases of the blood and blood-forming organs and certain disorders involving the immune mechanism: Secondary | ICD-10-CM

## 2019-01-06 DIAGNOSIS — Z9079 Acquired absence of other genital organ(s): Secondary | ICD-10-CM

## 2019-01-06 DIAGNOSIS — E785 Hyperlipidemia, unspecified: Secondary | ICD-10-CM | POA: Diagnosis present

## 2019-01-06 DIAGNOSIS — Z885 Allergy status to narcotic agent status: Secondary | ICD-10-CM

## 2019-01-06 DIAGNOSIS — Z87891 Personal history of nicotine dependence: Secondary | ICD-10-CM

## 2019-01-06 DIAGNOSIS — F1911 Other psychoactive substance abuse, in remission: Secondary | ICD-10-CM | POA: Diagnosis present

## 2019-01-06 DIAGNOSIS — N170 Acute kidney failure with tubular necrosis: Secondary | ICD-10-CM | POA: Diagnosis present

## 2019-01-06 DIAGNOSIS — N401 Enlarged prostate with lower urinary tract symptoms: Secondary | ICD-10-CM | POA: Diagnosis present

## 2019-01-06 DIAGNOSIS — R7989 Other specified abnormal findings of blood chemistry: Secondary | ICD-10-CM

## 2019-01-06 DIAGNOSIS — R52 Pain, unspecified: Secondary | ICD-10-CM

## 2019-01-06 DIAGNOSIS — E669 Obesity, unspecified: Secondary | ICD-10-CM | POA: Diagnosis present

## 2019-01-06 DIAGNOSIS — Z6831 Body mass index (BMI) 31.0-31.9, adult: Secondary | ICD-10-CM

## 2019-01-06 DIAGNOSIS — D638 Anemia in other chronic diseases classified elsewhere: Secondary | ICD-10-CM | POA: Diagnosis present

## 2019-01-06 DIAGNOSIS — R945 Abnormal results of liver function studies: Secondary | ICD-10-CM

## 2019-01-06 DIAGNOSIS — K7589 Other specified inflammatory liver diseases: Secondary | ICD-10-CM | POA: Diagnosis present

## 2019-01-06 DIAGNOSIS — F1010 Alcohol abuse, uncomplicated: Secondary | ICD-10-CM

## 2019-01-06 DIAGNOSIS — N179 Acute kidney failure, unspecified: Secondary | ICD-10-CM

## 2019-01-06 DIAGNOSIS — C61 Malignant neoplasm of prostate: Secondary | ICD-10-CM | POA: Diagnosis present

## 2019-01-06 DIAGNOSIS — D563 Thalassemia minor: Secondary | ICD-10-CM | POA: Diagnosis present

## 2019-01-06 DIAGNOSIS — E872 Acidosis: Secondary | ICD-10-CM | POA: Diagnosis present

## 2019-01-06 LAB — CBC WITH DIFFERENTIAL/PLATELET
Abs Immature Granulocytes: 0.03 10*3/uL (ref 0.00–0.07)
BASOS PCT: 1 %
Basophils Absolute: 0 10*3/uL (ref 0.0–0.1)
Eosinophils Absolute: 0.1 10*3/uL (ref 0.0–0.5)
Eosinophils Relative: 1 %
HEMATOCRIT: 38.5 % — AB (ref 39.0–52.0)
Hemoglobin: 13.1 g/dL (ref 13.0–17.0)
IMMATURE GRANULOCYTES: 1 %
LYMPHS PCT: 29 %
Lymphs Abs: 1.6 10*3/uL (ref 0.7–4.0)
MCH: 22.2 pg — ABNORMAL LOW (ref 26.0–34.0)
MCHC: 34 g/dL (ref 30.0–36.0)
MCV: 65.3 fL — ABNORMAL LOW (ref 80.0–100.0)
MONO ABS: 0.1 10*3/uL (ref 0.1–1.0)
MONOS PCT: 2 %
NEUTROS ABS: 3.7 10*3/uL (ref 1.7–7.7)
Neutrophils Relative %: 66 %
PLATELETS: 224 10*3/uL (ref 150–400)
RBC: 5.9 MIL/uL — ABNORMAL HIGH (ref 4.22–5.81)
RDW: 18.6 % — AB (ref 11.5–15.5)
WBC MORPHOLOGY: INCREASED
WBC: 5.5 10*3/uL (ref 4.0–10.5)
nRBC: 0.5 % — ABNORMAL HIGH (ref 0.0–0.2)

## 2019-01-06 LAB — COMPREHENSIVE METABOLIC PANEL
ALBUMIN: 3.7 g/dL (ref 3.5–5.0)
ALT: 2204 U/L — ABNORMAL HIGH (ref 0–44)
AST: 5520 U/L — AB (ref 15–41)
Alkaline Phosphatase: 108 U/L (ref 38–126)
Anion gap: 25 — ABNORMAL HIGH (ref 5–15)
BILIRUBIN TOTAL: 2.2 mg/dL — AB (ref 0.3–1.2)
BUN: 12 mg/dL (ref 8–23)
CHLORIDE: 101 mmol/L (ref 98–111)
CO2: 17 mmol/L — ABNORMAL LOW (ref 22–32)
CREATININE: 1.28 mg/dL — AB (ref 0.61–1.24)
Calcium: 8.5 mg/dL — ABNORMAL LOW (ref 8.9–10.3)
GFR calc Af Amer: >60 mL/min (ref >60)
GFR calc non Af Amer: >60 mL/min (ref >60)
GLUCOSE: 103 mg/dL — AB (ref 70–99)
POTASSIUM: 3.2 mmol/L — AB (ref 3.5–5.1)
Sodium: 143 mmol/L (ref 135–145)
Total Protein: 6.6 g/dL (ref 6.5–8.1)

## 2019-01-06 LAB — TROPONIN I: Troponin I: <0.03 ng/mL (ref <0.03)

## 2019-01-06 LAB — APTT: APTT: 33 s (ref 24–36)

## 2019-01-06 LAB — LIPID PANEL
CHOLESTEROL: 333 mg/dL — AB (ref 0–200)
HDL: 18 mg/dL — ABNORMAL LOW (ref >40)
LDL Cholesterol: UNDETERMINED mg/dL (ref 0–99)
TRIGLYCERIDES: 4875 mg/dL — AB (ref <150)
VLDL: UNDETERMINED mg/dL (ref 0–40)

## 2019-01-06 LAB — PROTIME-INR
INR: 1.1 (ref 0.8–1.2)
Prothrombin Time: 13.9 seconds (ref 11.4–15.2)

## 2019-01-06 LAB — LIPASE, BLOOD: Lipase: 21 U/L (ref 11–51)

## 2019-01-06 LAB — I-STAT CREATININE, ED: CREATININE: 1.1 mg/dL (ref 0.61–1.24)

## 2019-01-06 LAB — MAGNESIUM: Magnesium: 1.7 mg/dL (ref 1.7–2.4)

## 2019-01-06 LAB — ETHANOL: ALCOHOL ETHYL (B): 22 mg/dL — AB (ref <10)

## 2019-01-06 LAB — LACTATE DEHYDROGENASE: LDH: >10000 U/L — ABNORMAL HIGH (ref 98–192)

## 2019-01-06 LAB — CK: Total CK: 248 U/L (ref 49–397)

## 2019-01-06 LAB — SALICYLATE LEVEL: Salicylate Lvl: <7 mg/dL (ref 2.8–30.0)

## 2019-01-06 MED ORDER — IOHEXOL 350 MG/ML SOLN
100.0000 mL | Freq: Once | INTRAVENOUS | Status: AC | PRN
  Administered 2019-01-06: 100 mL via INTRAVENOUS

## 2019-01-06 MED ORDER — FENTANYL CITRATE (PF) 100 MCG/2ML IJ SOLN
50.0000 ug | Freq: Once | INTRAMUSCULAR | Status: AC
  Administered 2019-01-06: 50 ug via INTRAVENOUS
  Filled 2019-01-06: qty 2

## 2019-01-06 MED ORDER — SODIUM CHLORIDE 0.9 % IV BOLUS
1000.0000 mL | Freq: Once | INTRAVENOUS | Status: AC
  Administered 2019-01-06: 1000 mL via INTRAVENOUS

## 2019-01-06 NOTE — ED Triage Notes (Signed)
[facility]- pt from home. Pt complains of chest pain since 1715. Pt describes it as pressure all over. Pt has been hyperventilating. 10/10 cbest pain. 324 ASA and 0.4 nitro given by EMS. Pt vomited x1.  109/72 113 HR 97 RA 22 RR

## 2019-01-06 NOTE — H&P (Addendum)
History and Physical    Bradley Ray JTT:[id number] DOB: [birth year] DOA: 01/06/2019  PCP: Nolene Ebbs, MD Patient coming from: Home  Chief Complaint: Chest pain  HPI: Bradley Ray is a 63 y.o. male with medical history significant of polysubstance use, hypertension, hyperlipidemia, and conditions listed below presenting to the hospital for evaluation of chest pain.  Patient states yesterday around 4 PM he was sitting and watching TV and experienced acute onset pressure across his chest and upper abdomen.  Denies any shortness of breath or nausea at that time.  States this lasted about 45 minutes and when EMS gave him nitroglycerin he vomited.  Denies any history of CAD or prior MI.  States he quit smoking 8 years ago.  States his maternal aunt had CAD.  Denies any fevers or recent sick contacts.  States he was not having any nausea, vomiting, or abdominal pain at home but does report feeling tired lately.  States he drinks 2-3 shots of whiskey every day but yesterday drank more than usual because it was his friend's birthday.  He had a pint of liquor yesterday.  States he smokes marijuana occasionally.  Denies any other drug use. Patient denies any acetaminophen use.  Denies use of any other over-the-counter medications or supplements.  Denies any recent antibiotic use.  Review of Systems: As per HPI otherwise 10 point review of systems negative.  Past Medical History:  Diagnosis Date  . Alcoholism ([place])    per pt as of 10-08-2016 as cut down drinking to 2-3 cans per day --- per epic documentation long hx dependence  . Arthritis    knees  . Beta thalassemia trait    per hematologist/ oncologist note- dr Dorita Sciara--  per blood work-up  dx 02/ 2017  . BPH with obstruction/lower urinary tract symptoms   . Cocaine use    last used as of 10-08-2016 pt stated "had a little powder before christmas"  . ED (erectile dysfunction)   . Hepatitis    states was 30 yrs ago from "food"  .  Hyperlipidemia   . Hypertension   . Prostate cancer [facility]) urologist-  dr wrenn/  oncologist-  dr Tammi Klippel   dx 07-02-2016,  T1c,  Gleason 3+3,  PSA 6.48,  48.44cc  . 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 Wears dentures    upper    Past Surgical History:  Procedure Laterality Date  . CYSTOSCOPY N/A 10/15/2016   Procedure: CYSTOSCOPY FLEXIBLE;  Surgeon: JIrine Seal MD;  Location: [facility];  Service: Urology;  Laterality: N/A;  NO SEEDS FOUND IN BLADDER  . ORIF LEFT ANKLE  1980's  . PROSTATE BIOPSY    . RADIOACTIVE SEED IMPLANT N/A 10/15/2016   Procedure: RADIOACTIVE SEED IMPLANT/BRACHYTHERAPY IMPLANT;  Surgeon: JIrine Seal MD;  Location: [facility];  Service: Urology;  Laterality: N/A;     66    SEEDS IMPLANTED  . REPAIR EXTENSOR TENDON Right 06/02/2016   Procedure: RIGHT LONG FINGER EXTENSOR TENDON REPAIR;  Surgeon: DMilly Jakob MD;  Location: [facility]  Service: Orthopedics;  Laterality: Right;  . TOTAL KNEE ARTHROPLASTY Right 2010  approx.  . TRANSTHORACIC ECHOCARDIOGRAM  11/28/2015   mild focal basal hypertrophy of septum, ef 50-55%, possible hypokinesis of mid inferoseptal and apical septal myocardium, grade 1 diastolic dysfunction/  trivial PR  . TRANSURETHRAL RESECTION OF PROSTATE N/A  06/16/2018   Procedure: TRANSURETHRAL RESECTION OF THE PROSTATE (TURP);  Surgeon: [name], MD;  Location: [facility];  Service: Urology;  Laterality: N/A;     reports that he quit smoking about 12 years ago. His smoking use included cigarettes. He has a 30.00 pack-year smoking history. He has never used smokeless tobacco. He reports current alcohol use of about 16.0 - 23.0 standard drinks of alcohol per week. He reports previous drug use. Drugs: "Crack" cocaine and Marijuana.  Allergies  Allergen Reactions  . Morphine And Related Rash    Family History  Problem  Relation Age of Onset  . Arrhythmia Mother        Has pacer  . Throat cancer Father   . Pancreatic cancer Sister   . Clotting disorder Sister        related knee surgeries  . Cancer Sister        pancreatic  . Sickle cell trait Son   . Cancer Maternal Aunt   . Cancer Maternal Uncle   . Cancer Other     Prior to Admission medications   Medication Sig Start Date End Date Taking? Authorizing Provider  acetaminophen (TYLENOL) 500 MG tablet Take 1,000 mg by mouth daily as needed for moderate pain or headache.    [provider]  amLODipine (NORVASC) 10 MG tablet Take 1 tablet (10 mg total) by mouth daily. Patient taking differently: Take 10 mg by mouth every morning.  11/29/15   Kelvin Cellar, MD  aspirin 81 MG tablet Take 162 mg by mouth daily.     [provider]  atorvastatin (LIPITOR) 40 MG tablet Take 1 tablet (40 mg total) by mouth daily. Patient taking differently: Take 40 mg by mouth every morning.  11/28/15   Kelvin Cellar, MD  hydrochlorothiazide (MICROZIDE) 12.5 MG capsule Take 12.5 mg by mouth every morning.     [provider]  oxyCODONE-acetaminophen (PERCOCET/ROXICET) 5-325 MG tablet Take 1 tablet by mouth every 6 (six) hours as needed for severe pain. 04/11/18   Glyn Ade, PA-C    Physical Exam: Vitals:   01/07/19 0145 01/07/19 0214 01/07/19 0225 01/07/19 0427  BP: 134/82 (!) 131/91  135/87  Pulse: (!) 104 (!) 105  98  Resp: (!) '22 18  18  ' Temp: 98.6 F (37 C) 98.3 F (36.8 C)  98.6 F (37 C)  TempSrc:  Oral  Oral  SpO2: 91% 95%  96%  Weight:   78.8 kg   Height:        Physical Exam  Constitutional: He is oriented to person, place, and time. He appears well-developed and well-nourished. No distress.  HENT:  Head: Normocephalic.  Mouth/Throat: Oropharynx is clear and moist.  Eyes: Right eye exhibits no discharge. Left eye exhibits no discharge. Scleral icterus is present.  Neck: Neck supple.  Cardiovascular: Regular  rhythm and intact distal pulses.  Slightly tachycardic  Pulmonary/Chest: Effort normal and breath sounds normal. No respiratory distress. He has no wheezes. He has no rales.  Abdominal: Bowel sounds are normal. He exhibits distension. There is abdominal tenderness. There is guarding. There is no rebound.  Epigastrium tender to palpation  Musculoskeletal:        General: No edema.  Neurological: He is alert and oriented to person, place, and time.  Skin: Skin is warm and dry. He is not diaphoretic.     Labs on Admission: I have personally reviewed following labs and imaging studies  CBC: Recent Labs  Lab 01/06/19 1903  WBC 5.5  NEUTROABS 3.7  HGB 13.1  HCT 38.5*  MCV 65.3*  PLT 295   Basic Metabolic Panel: Recent Labs  Lab 01/06/19 1903 01/06/19 1921 01/07/19 0114  NA 143  --   --   K 3.2*  --   --   CL 101  --   --   CO2 17*  --   --   GLUCOSE 103*  --   --   BUN 12  --   --   CREATININE 1.28* 1.10  --   CALCIUM 8.5*  --   --   MG 1.7  --  2.3  PHOS  --   --  2.0*   GFR: Estimated Creatinine Clearance: 69.6 mL/min (by C-G formula based on SCr of 1.1 mg/dL). Liver Function Tests: Recent Labs  Lab 01/06/19 1903  AST 5,520*  ALT 2,204*  ALKPHOS 108  BILITOT 2.2*  PROT 6.6  ALBUMIN 3.7   Recent Labs  Lab 01/06/19 1903  LIPASE 21   No results for input(s): AMMONIA in the last 168 hours. Coagulation Profile: Recent Labs  Lab 01/06/19 2058  INR 1.1   Cardiac Enzymes: Recent Labs  Lab 01/06/19 1903 01/06/19 2234 01/07/19 0114  CKTOTAL  --  248  --   TROPONINI <0.03  --  <0.03   BNP (last 3 results) No results for input(s): PROBNP in the last 8760 hours. HbA1C: No results for input(s): HGBA1C in the last 72 hours. CBG: No results for input(s): GLUCAP in the last 168 hours. Lipid Profile: Recent Labs    01/06/19 2058  CHOL 333*  HDL 18*  LDLCALC UNABLE TO CALCULATE IF TRIGLYCERIDE OVER 400 mg/dL  TRIG 4,875*  CHOLHDL NOT REPORTED DUE TO  HIGH TRIGLYCERIDES   Thyroid Function Tests: No results for input(s): TSH, T4TOTAL, FREET4, T3FREE, THYROIDAB in the last 72 hours. Anemia Panel: No results for input(s): VITAMINB12, FOLATE, FERRITIN, TIBC, IRON, RETICCTPCT in the last 72 hours. Urine analysis:    Component Value Date/Time   COLORURINE YELLOW 01/12/2017 0106   APPEARANCEUR HAZY (A) 01/12/2017 0106   LABSPEC 1.006 01/12/2017 0106   PHURINE 6.0 01/12/2017 0106   GLUCOSEU NEGATIVE 01/12/2017 0106   HGBUR LARGE (A) 01/12/2017 0106   BILIRUBINUR NEGATIVE 01/12/2017 0106   KETONESUR NEGATIVE 01/12/2017 0106   PROTEINUR 100 (A) 01/12/2017 0106   NITRITE NEGATIVE 01/12/2017 0106   LEUKOCYTESUR MODERATE (A) 01/12/2017 0106    Radiological Exams on Admission: Dg Chest 2 View  Result Date: 01/06/2019 CLINICAL DATA:  Chest pain beginning 1715 hours.  Chest pressure. EXAM: CHEST - 2 VIEW COMPARISON:  08/21/2016 FINDINGS: Artifact overlies the chest. Heart size is normal. Mediastinal shadows are normal. The pulmonary vascularity is normal. The lungs are clear. No effusions. No acute bone finding. Relative chronic elevation of the right hemidiaphragm. IMPRESSION: No active cardiopulmonary disease. Electronically Signed   By: Nelson Chimes M.D.   On: 01/06/2019 19:51   Ct Angio Chest/abd/pel For Dissection W And/or W/wo  Result Date: 01/06/2019 CLINICAL DATA:  Chest pain starting at 5:15 p.m. Chest pressure. Vomiting. Assessment for aortic dissection. EXAM: CT ANGIOGRAPHY CHEST, ABDOMEN AND PELVIS TECHNIQUE: Initial multi detector CT imaging of the chest was performed prior to contrast administration. Subsequently, multi detector CT imaging through the chest, abdomen and pelvis was performed using the standard protocol during bolus administration of intravenous contrast. Multiplanar reconstructed images and MIPs were obtained and reviewed to evaluate the vascular anatomy. CONTRAST:  100 cc Omnipaque 300 COMPARISON:  Multiple exams,  including 11/26/2015 CT scan FINDINGS: Vascular opacification is suboptimal due to presumed delayed imaging. I reviewed the images and feel that these are adequate to assess for aortic dissection and do not require a repeat dose of contrast and repeat scan of the chest, abdomen, and pelvis, with such a repeat scan also not guaranteed to catch the contrast bolus perfectly. CTA CHEST FINDINGS Cardiovascular: On noncontrast images, there are no findings of acute intramural hematoma. Coronary, aortic arch, and branch vessel atherosclerotic vascular disease. Coronary artery involvement is primarily in the left anterior descending coronary artery. Mild cardiomegaly is present. There is heterogeneity in the pulmonary arterial tree but also in the pulmonary venous tree, and overall the do not feel that this exam is highly reliable in assessing for pulmonary embolus. It was not protocolled to assess for pulmonary embolus. No thoracic aortic or branch vessel dissection or acute vascular finding is noted. Mediastinum/Nodes: Subtle nonspecific stranding in the left lower neck potentially extending into the superior mediastinum, questionable perivascular distribution. No obvious abscess or pneumomediastinum. No pathologic adenopathy observed. Lungs/Pleura: Mild dependent atelectasis in both lower lobes. Subsegmental atelectasis in the superior segments of both lower lobes. Musculoskeletal: Unremarkable Review of the MIP images confirms the above findings. CTA ABDOMEN AND PELVIS FINDINGS VASCULAR Aorta: Subtle periaortic/retroperitoneal stranding. Atherosclerotic calcification noted. No aneurysm or dissection is visible. No significant aortic stenosis. Celiac: Widely patent celiac tree. SMA: Widely patent. The SMA is thought to provide at least some of the right hepatic arterial supply. Renals: Patent and unremarkable. IMA: Patent. Inflow: Subtle perivascular stranding along the external iliac arteries and left common femoral  artery. Atherosclerotic calcification of the common iliac and common femoral arteries without significant atherosclerotic narrowing. No dissection. Veins: Unremarkable Review of the MIP images confirms the above findings. NON-VASCULAR Hepatobiliary: Unremarkable Pancreas: Unremarkable Spleen: Unremarkable Adrenals/Urinary Tract: 1.7 by 1.3 cm left adrenal adenoma. The kidneys appear unremarkable. Stomach/Bowel: Unremarkable Lymphatic: No pathologic adenopathy is observed. Reproductive: Prostate brachytherapy seed implants noted. Other: No supplemental non-categorized findings. Musculoskeletal: Partial bony bridging at L5-S1 with facet and intervertebral spurring resulting in mild bilateral foraminal impingement. Lesser foraminal narrowing at L3-4 and L4-5. Review of the MIP images confirms the above findings. IMPRESSION: 1. No vascular dissection is identified. 2. There is some unusual perivascular stranding along the abdominal aorta, left common femoral artery, and possibly along the left lower neck extending into the upper mediastinum. Although nonspecific, the possibility of large vessel vasculitis is not totally excluded, and correlation with sedimentation rate or other clinical indicators of possible arteritis is suggested. No aneurysm is observed. 3. The contrast bolus was mildly delayed, suboptimal but adequate for assessing the systemic arteries/aorta. Today's exam is not considered highly sensitive in assessing the pulmonary arteries due to the delayed contrast phase. I did not repeat dose the patient given the mildly elevated creatinine and the additional radiation burden rescanning the entire chest, abdomen, and pelvis would incur. 4. Aortic Atherosclerosis (ICD10-I70.0). Coronary atherosclerosis especially in the left anterior descending coronary artery, with mild cardiomegaly. 5. Lower lumbar bony foraminal narrowing. Electronically Signed   By: Van Clines M.D.   On: 01/06/2019 21:58    EKG:  Independently reviewed.  Sinus tachycardia (heart rate 109).  Assessment/Plan Principal Problem:   Shock liver Active Problems:   Hypertriglyceridemia   Chest pain   SIRS (systemic inflammatory response syndrome) (HCC)   AKI (acute kidney injury) ([place])   Elevated LFTs; suspect shock liver: AST 5520 and ALT 2204. T bili 2.2.  Coags normal.  Platelet count normal.  No encephalopathy.  Salicylate level negative.  Ethanol level mildly elevated at 22.  CK normal.    Lactic acid 4.3.  Phosphorus level 2.0.  Extremely high LDH (greater than 10,000).  Patient denies any acetaminophen use.  Denies use of any other over-the-counter medications or supplements.  Denies any recent antibiotic use.  States he smokes marijuana occasionally but denies any other drug use. Plan: Discussed with GI, will consult in a.m.  Hepatitis panel pending.  Check acetaminophen level and ammonia level.  Replete phosphorus.  Check HIV antibody.  Check UDS.  Consider work-up for autoimmune hepatitis; pending GI evaluation.  Continue to monitor LFTs.  Severe hypertriglyceridemia: Lipid panel showing triglycerides 4875, cholesterol 333, and HDL 18. ?whether related to shock liver.  Plan: Encouraged cessation of alcohol use.  Low-fat diet. Avoid starting a statin or fibrate at this time in the setting of acute liver failure.  Chest pain: Does have significant risk factors for CAD including age, gender, hypertension, hyperlipidemia, prior tobacco use, and family history.  Troponin x 2 negative.  EKG not suggestive of ACS.  Currently not complaining of chest pain.  Chest x-ray showing no active cardiopulmonary disease.  CT angiogram negative for dissection and inconclusive for PE.  Not hypoxic.  Lipid panel showing low HDL and severe hypertriglyceridemia.  Echo done in February 2017 with possible hypokinesis of the mid inferoseptal and apical septal myocardium. Received aspirin 324 mg and 0.4 mg nitro by EMS. Plan: Cardiac monitoring.   Continue to trend troponin.  Sublingual nitroglycerin as needed.  Echocardiogram.  SIRS: Afebrile.  No leukocytosis.  Tachycardic and tachypneic.  Not hypotensive.  Lactic acid elevated at 4.3 in the setting of shock liver.  Chest x-ray not suggestive of pneumonia. Plan: IV fluid.  Continue to trend lactate.  Check UA.  Continue to monitor hemodynamics.  Mild AKI: Creatinine 1.2, baseline 0.8. Plan: IV fluid hydration.  Avoid nephrotoxic agents.  Continue to monitor BMP.  Hypokalemia: Potassium 3.2.  Magnesium within normal range. Plan: Replete potassium and continue to monitor BMP.  High anion gap metabolic acidosis: Suspect secondary to lactic acidosis from shock liver.  Salicylate level negative.  Bicarb 17, anion gap 25.  Blood glucose 103. Plan: IV fluid hydration.  Concern for large vessel vasculitis: CT showing some unusual perivascular stranding along the abdominal aorta, left common femoral artery, and possibly along the left lower neck extending into the upper mediastinum; suspicion for large vessel vasculitis. Plan: Check ESR and CRP levels.  Alcohol dependence: CIWA monitoring; Ativan PRN. Thiamine, folate, multivitamin.  DVT prophylaxis: Lovenox Code Status: Full code Family Communication: No family available. Disposition Plan: Anticipate discharge after clinical improvement. Consults called: [facility] Admission status: It is my clinical opinion that admission to INPATIENT is reasonable and necessary in this 62 y.o. male . presenting with shock liver, hypertriglyceridemia, and chest pain . in the context of PMH including: Alcohol dependence . with pertinent positives on physical exam including: Tachycardia, scleral icterus . and pertinent positives on radiographic and laboratory data including: Labs with elevated LFTs and elevated triglycerides. . Workup and treatment include GI evaluation in the morning and treatment plan mentioned above.  Given the aforementioned, the  predictability of an adverse outcome is felt to be significant. I expect that the patient will require at least 2 midnights in the hospital to treat this condition.   This chart was dictated using voice recognition software.  Despite best efforts to proofread, errors can  occur which can change the documentation meaning.  Shela Leff MD [facility] Pager [phone]  If 7PM-7AM, please contact night-coverage [email or web address] Password [facility]  01/07/2019, 5:15 AM

## 2019-01-06 NOTE — ED Provider Notes (Signed)
[place] EMERGENCY DEPARTMENT Provider Note   CSN: [id number] Arrival date & time: 01/06/19  [place]    History   Chief Complaint No chief complaint on file.   HPI Bradley Ray is a 63 y.o. male presenting for evaluation of chest pain.  Patient states he developed acute onset chest pain at 515 tonight, approximately an hour and a half prior to arrival.  Patient reports he had some mild nausea prior to the chest pain, tried to eat, but then had an episode of vomiting.  He reports acute onset chest pain which he describes as a pressure all over.  He is also reporting severe pain coming into his right leg, causing it to feel numb and tingly.  He denies back pain.  Patient reports pain is worse when he takes a deep breath in.  He denies recent fevers, chills, sore throat, cough, abdominal pain, urinary symptoms, normal bowel movements.  Patient states he had a lot to drink yesterday, but has had nothing to drink today.  He denies tobacco or drug use.  He reports a history of high blood pressure and hyperlipidemia for which he takes medication.  He did take his medication today.  He denies other medical problems.  Additional history obtained from chart review.  Patient with a history of alcoholism, BPH, hepatitis, HLD, hypertension, history of prostate cancer status post turp.  Patient reports a history of hepatitis, for which she was treated by the health department.  He used to smoke crack cocaine, quit 7 years ago.  No other current drug use.  GI doctor is with [facility].      HPI  Past Medical History:  Diagnosis Date   Alcoholism ([place])    per pt as of 10-08-2016 as cut down drinking to 2-3 cans per day --- per epic documentation long hx dependence   Arthritis    knees   Beta thalassemia trait    per hematologist/ oncologist note- dr Dorita Sciara--  per blood work-up  dx 02/ 2017   BPH with obstruction/lower urinary tract symptoms    Cocaine use    last used as of  10-08-2016 pt stated "had a little powder before christmas"   ED (erectile dysfunction)    Hepatitis    states was 30 yrs ago from "food"   Hyperlipidemia    Hypertension    Prostate cancer [facility] urologist-  dr wrenn/  oncologist-  dr Tammi Klippel   dx 07-02-2016,  T1c,  Gleason 3+3,  PSA 6.48,  48.44cc   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 ([facility])   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  anticoagulated w/ xarelto until june 2017 changed to asa 162mg  daily   Wears dentures    upper    Patient Active Problem List   Diagnosis Date Noted   BPH with urinary obstruction 06/16/2018   Malignant neoplasm of prostate ([place]) 08/10/2016   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   Essential hypertension 11/26/2015   Thalassemia trait, beta 11/26/2015   High anion gap metabolic acidosis 19/50/9326   Substance abuse in remission ([place]) 11/26/2015    Past Surgical History:  Procedure Laterality Date   CYSTOSCOPY N/A 10/15/2016   Procedure: CYSTOSCOPY FLEXIBLE;  Surgeon: [name], MD;  Location: [facility];  Service: Urology;  Laterality: N/A;  NO SEEDS FOUND IN BLADDER   ORIF LEFT ANKLE  1980's   PROSTATE BIOPSY     RADIOACTIVE SEED IMPLANT N/A 10/15/2016   Procedure: RADIOACTIVE SEED IMPLANT/BRACHYTHERAPY IMPLANT;  Surgeon: [name], MD;  Location: [facility];  Service: Urology;  Laterality: N/A;     66    SEEDS IMPLANTED   REPAIR EXTENSOR TENDON Right 06/02/2016   Procedure: RIGHT LONG FINGER EXTENSOR TENDON REPAIR;  Surgeon: Milly Jakob, MD;  Location: [place];  Service: Orthopedics;  Laterality: Right;   TOTAL KNEE ARTHROPLASTY Right 2010  approx.   TRANSTHORACIC ECHOCARDIOGRAM  11/28/2015   mild focal basal hypertrophy of septum, ef 50-55%, possible hypokinesis of mid inferoseptal and apical septal myocardium, grade 1 diastolic dysfunction/  trivial PR   TRANSURETHRAL RESECTION OF PROSTATE N/A 06/16/2018   Procedure: TRANSURETHRAL RESECTION OF THE PROSTATE (TURP);   Surgeon: [name], MD;  Location: [facility];  Service: Urology;  Laterality: N/A;        Home Medications    Prior to Admission medications   Medication Sig Start Date End Date Taking? Authorizing Provider  acetaminophen (TYLENOL) 500 MG tablet Take 1,000 mg by mouth daily as needed for moderate pain or headache.    [provider]  amLODipine (NORVASC) 10 MG tablet Take 1 tablet (10 mg total) by mouth daily. Patient taking differently: Take 10 mg by mouth every morning.  11/29/15   Kelvin Cellar, MD  aspirin 81 MG tablet Take 162 mg by mouth daily.     [provider]  atorvastatin (LIPITOR) 40 MG tablet Take 1 tablet (40 mg total) by mouth daily. Patient taking differently: Take 40 mg by mouth every morning.  11/28/15   Kelvin Cellar, MD  hydrochlorothiazide (MICROZIDE) 12.5 MG capsule Take 12.5 mg by mouth every morning.     [provider]  oxyCODONE-acetaminophen (PERCOCET/ROXICET) 5-325 MG tablet Take 1 tablet by mouth every 6 (six) hours as needed for severe pain. 04/11/18   Glyn Ade, PA-C    Family History Family History  Problem Relation Age of Onset   Arrhythmia Mother        Has pacer   Throat cancer Father    Pancreatic cancer Sister    Clotting disorder Sister        related knee surgeries   Cancer Sister        pancreatic   Sickle cell trait Son    Cancer Maternal Aunt    Cancer Maternal Uncle    Cancer Other     Social History Social History   Tobacco Use   Smoking status: Former Smoker    Packs/day: 1.00    Years: 30.00    Pack years: 30.00    Types: Cigarettes    Last attempt to quit: 06/01/2006    Years since quitting: 12.6   Smokeless tobacco: Never Used  Substance Use Topics   Alcohol use: Yes    Alcohol/week: 16.0 - 23.0 standard drinks    Types: 14 - 21 Cans of beer, 2 Shots of liquor per week    Comment: per pt 10/13/16- 2-3 cans daily, shots 2 per weekend (per epic documentation (feb 2017)  admitted to  2 pints spirits, 12 pk beer, and 1-2 40oz drink daily)   Drug use: Not Currently    Types: "Crack" cocaine, Marijuana    Comment: 10/13/16  states no cocaine since before Christmas,, 2 marijuana weekly     Allergies   Morphine and related   Review of Systems Review of Systems  Cardiovascular: Positive for chest pain.  Gastrointestinal: Positive for nausea and vomiting (x1).  Musculoskeletal: Positive for myalgias (R leg pain/numbness/tingling).  All other systems  reviewed and are negative.    Physical Exam Updated Vital Signs BP 126/78    Pulse (!) 106    Temp (!) 97.4 F (36.3 C) (Oral)    Resp (!) 29    Ht 5\' 6"  (1.676 m)    Wt 80.7 kg    SpO2 96%    BMI 28.73 kg/m   Physical Exam Vitals signs and nursing note reviewed.  Constitutional:      Appearance: He is well-developed.     Comments: Patient writhing in the bed due to pain  HENT:     Head: Normocephalic and atraumatic.  Eyes:     Conjunctiva/sclera: Conjunctivae normal.     Pupils: Pupils are equal, round, and reactive to light.  Neck:     Musculoskeletal: Normal range of motion and neck supple.  Cardiovascular:     Rate and Rhythm: Regular rhythm. Tachycardia present.     Pulses: Normal pulses.     Comments: tachycardic around 120 Pulmonary:     Effort: Pulmonary effort is normal. No respiratory distress.     Breath sounds: Normal breath sounds. No wheezing.     Comments: Speaking in full sentences. Clear lung sounds Abdominal:     General: There is no distension.     Palpations: Abdomen is soft. There is no mass.     Tenderness: There is abdominal tenderness. There is no guarding or rebound.     Comments: ttp of epigastric abd. No ttp of lower abd.   Musculoskeletal: Normal range of motion.     Comments: No increased pain with palpation or the RLE. no ttp of the calf.  Good pedal pulses, cap refill, and temperature bilaterally.  Skin:    General: Skin is warm and dry.     Capillary Refill:  Capillary refill takes less than 2 seconds.  Neurological:     Mental Status: He is alert and oriented to person, place, and time.      ED Treatments / Results  Labs (all labs ordered are listed, but only abnormal results are displayed) Labs Reviewed  CBC WITH DIFFERENTIAL/PLATELET - Abnormal; Notable for the following components:      Result Value   RBC 5.90 (*)    HCT 38.5 (*)    MCV 65.3 (*)    MCH 22.2 (*)    RDW 18.6 (*)    nRBC 0.5 (*)    All other components within normal limits  COMPREHENSIVE METABOLIC PANEL - Abnormal; Notable for the following components:   Potassium 3.2 (*)    CO2 17 (*)    Glucose, Bld 103 (*)    Creatinine, Ser 1.28 (*)    Calcium 8.5 (*)    AST 5,520 (*)    ALT 2,204 (*)    Total Bilirubin 2.2 (*)    Anion gap 25 (*)    All other components within normal limits  ETHANOL - Abnormal; Notable for the following components:   Alcohol, Ethyl (B) 22 (*)    All other components within normal limits  LIPID PANEL - Abnormal; Notable for the following components:   Cholesterol 333 (*)    Triglycerides 4,875 (*)    HDL 18 (*)    All other components within normal limits  MAGNESIUM  TROPONIN I  LIPASE, BLOOD  SALICYLATE LEVEL  PROTIME-INR  APTT  CK  HEPATITIS PANEL, ACUTE  LACTATE DEHYDROGENASE  I-STAT CREATININE, ED    EKG EKG Interpretation  Date/Time:  Friday January 06 2019 18:35:49 EDT  Ventricular Rate:  109 PR Interval:    QRS Duration: 101 QT Interval:  367 QTC Calculation: 495 R Axis:   74 Text Interpretation:  Sinus tachycardia Borderline low voltage, extremity leads Borderline prolonged QT interval No significant change since last tracing Confirmed by Isla Pence [phone]) on 01/06/2019 7:38:19 PM   Radiology Dg Chest 2 View  Result Date: 01/06/2019 CLINICAL DATA:  Chest pain beginning 1715 hours.  Chest pressure. EXAM: CHEST - 2 VIEW COMPARISON:  08/21/2016 FINDINGS: Artifact overlies the chest. Heart size is normal.  Mediastinal shadows are normal. The pulmonary vascularity is normal. The lungs are clear. No effusions. No acute bone finding. Relative chronic elevation of the right hemidiaphragm. IMPRESSION: No active cardiopulmonary disease. Electronically Signed   By: Nelson Chimes M.D.   On: 01/06/2019 19:51   Ct Angio Chest/abd/pel For Dissection W And/or W/wo  Result Date: 01/06/2019 CLINICAL DATA:  Chest pain starting at 5:15 p.m. Chest pressure. Vomiting. Assessment for aortic dissection. EXAM: CT ANGIOGRAPHY CHEST, ABDOMEN AND PELVIS TECHNIQUE: Initial multi detector CT imaging of the chest was performed prior to contrast administration. Subsequently, multi detector CT imaging through the chest, abdomen and pelvis was performed using the standard protocol during bolus administration of intravenous contrast. Multiplanar reconstructed images and MIPs were obtained and reviewed to evaluate the vascular anatomy. CONTRAST:  100 cc Omnipaque 300 COMPARISON:  Multiple exams, including 11/26/2015 CT scan FINDINGS: Vascular opacification is suboptimal due to presumed delayed imaging. I reviewed the images and feel that these are adequate to assess for aortic dissection and do not require a repeat dose of contrast and repeat scan of the chest, abdomen, and pelvis, with such a repeat scan also not guaranteed to catch the contrast bolus perfectly. CTA CHEST FINDINGS Cardiovascular: On noncontrast images, there are no findings of acute intramural hematoma. Coronary, aortic arch, and branch vessel atherosclerotic vascular disease. Coronary artery involvement is primarily in the left anterior descending coronary artery. Mild cardiomegaly is present. There is heterogeneity in the pulmonary arterial tree but also in the pulmonary venous tree, and overall the do not feel that this exam is highly reliable in assessing for pulmonary embolus. It was not protocolled to assess for pulmonary embolus. No thoracic aortic or branch vessel  dissection or acute vascular finding is noted. Mediastinum/Nodes: Subtle nonspecific stranding in the left lower neck potentially extending into the superior mediastinum, questionable perivascular distribution. No obvious abscess or pneumomediastinum. No pathologic adenopathy observed. Lungs/Pleura: Mild dependent atelectasis in both lower lobes. Subsegmental atelectasis in the superior segments of both lower lobes. Musculoskeletal: Unremarkable Review of the MIP images confirms the above findings. CTA ABDOMEN AND PELVIS FINDINGS VASCULAR Aorta: Subtle periaortic/retroperitoneal stranding. Atherosclerotic calcification noted. No aneurysm or dissection is visible. No significant aortic stenosis. Celiac: Widely patent celiac tree. SMA: Widely patent. The SMA is thought to provide at least some of the right hepatic arterial supply. Renals: Patent and unremarkable. IMA: Patent. Inflow: Subtle perivascular stranding along the external iliac arteries and left common femoral artery. Atherosclerotic calcification of the common iliac and common femoral arteries without significant atherosclerotic narrowing. No dissection. Veins: Unremarkable Review of the MIP images confirms the above findings. NON-VASCULAR Hepatobiliary: Unremarkable Pancreas: Unremarkable Spleen: Unremarkable Adrenals/Urinary Tract: 1.7 by 1.3 cm left adrenal adenoma. The kidneys appear unremarkable. Stomach/Bowel: Unremarkable Lymphatic: No pathologic adenopathy is observed. Reproductive: Prostate brachytherapy seed implants noted. Other: No supplemental non-categorized findings. Musculoskeletal: Partial bony bridging at L5-S1 with facet and intervertebral spurring resulting in mild bilateral foraminal impingement. Lesser foraminal  narrowing at L3-4 and L4-5. Review of the MIP images confirms the above findings. IMPRESSION: 1. No vascular dissection is identified. 2. There is some unusual perivascular stranding along the abdominal aorta, left common  femoral artery, and possibly along the left lower neck extending into the upper mediastinum. Although nonspecific, the possibility of large vessel vasculitis is not totally excluded, and correlation with sedimentation rate or other clinical indicators of possible arteritis is suggested. No aneurysm is observed. 3. The contrast bolus was mildly delayed, suboptimal but adequate for assessing the systemic arteries/aorta. Today's exam is not considered highly sensitive in assessing the pulmonary arteries due to the delayed contrast phase. I did not repeat dose the patient given the mildly elevated creatinine and the additional radiation burden rescanning the entire chest, abdomen, and pelvis would incur. 4. Aortic Atherosclerosis (ICD10-I70.0). Coronary atherosclerosis especially in the left anterior descending coronary artery, with mild cardiomegaly. 5. Lower lumbar bony foraminal narrowing. Electronically Signed   By: Van Clines M.D.   On: 01/06/2019 21:58    Procedures Procedures (including critical care time)  Medications Ordered in ED Medications  fentaNYL (SUBLIMAZE) injection 50 mcg (50 mcg Intravenous Given 01/06/19 1934)  sodium chloride 0.9 % bolus 1,000 mL (1,000 mLs Intravenous New Bag/Given 01/06/19 2137)  iohexol (OMNIPAQUE) 350 MG/ML injection 100 mL (100 mLs Intravenous Contrast Given 01/06/19 2153)     Initial Impression / Assessment and Plan / ED Course  I have reviewed the triage vital signs and the nursing notes.  Pertinent labs & imaging results that were available during my care of the patient were reviewed by me and considered in my medical decision making (see chart for details).        Pt presenting for evaluation of chest pain/upper abdominal pain.  Physical exam concerning, patient is writhing in the bed and reporting pain in his R leg. Concern for possible dissection. Also ACS, pancreatitis, and GB abnormalities. Will order labs, CTA, cxr. Fentanyl for pain.    Troponin negative.  However, labs concerning for significantly elevated AST and ALT.  Will add on PT/INR, APTT, salicylate, ethanol, lipid panel, hepatitis panel.  CTA pending.  I expect patient will likely need to be admitted.  On reassessment, patient reports pain is improved, he no longer appears uncomfortable and is no longer writhing.  CTA negative for dissection.  No obvious hepatobiliary abnormalities.  CT scan did note mild perivascular stranding, although this was mostly along the left side where patient was having fewer symptoms.  As such, doubt cause or relevance to pt's sxs. Will consult with GI.   Discussed with on-call MD for [facility], recommends adding CK, LDH, and admission to hospitalist.   Discussed with Dr. Marlowe Sax from [facility], pt to be admitted.    Final Clinical Impressions(s) / ED Diagnoses   Final diagnoses:  Elevated LFTs  Alcohol abuse    ED Discharge Orders    None       Franchot Heidelberg, PA-C 01/06/19 2335    Isla Pence, MD 01/07/19 1550

## 2019-01-06 NOTE — ED Notes (Signed)
Patient transported to CT 

## 2019-01-07 ENCOUNTER — Other Ambulatory Visit (HOSPITAL_COMMUNITY): Payer: Medicaid Other

## 2019-01-07 ENCOUNTER — Inpatient Hospital Stay (HOSPITAL_COMMUNITY): Payer: Medicaid Other

## 2019-01-07 ENCOUNTER — Encounter (HOSPITAL_COMMUNITY): Payer: Self-pay | Admitting: Gastroenterology

## 2019-01-07 DIAGNOSIS — E781 Pure hyperglyceridemia: Secondary | ICD-10-CM | POA: Diagnosis present

## 2019-01-07 DIAGNOSIS — D638 Anemia in other chronic diseases classified elsewhere: Secondary | ICD-10-CM | POA: Diagnosis present

## 2019-01-07 DIAGNOSIS — F1290 Cannabis use, unspecified, uncomplicated: Secondary | ICD-10-CM | POA: Diagnosis present

## 2019-01-07 DIAGNOSIS — K7200 Acute and subacute hepatic failure without coma: Secondary | ICD-10-CM | POA: Diagnosis not present

## 2019-01-07 DIAGNOSIS — E872 Acidosis: Secondary | ICD-10-CM | POA: Diagnosis present

## 2019-01-07 DIAGNOSIS — E669 Obesity, unspecified: Secondary | ICD-10-CM | POA: Diagnosis present

## 2019-01-07 DIAGNOSIS — R079 Chest pain, unspecified: Secondary | ICD-10-CM

## 2019-01-07 DIAGNOSIS — N170 Acute kidney failure with tubular necrosis: Secondary | ICD-10-CM | POA: Diagnosis present

## 2019-01-07 DIAGNOSIS — F1910 Other psychoactive substance abuse, uncomplicated: Secondary | ICD-10-CM | POA: Diagnosis not present

## 2019-01-07 DIAGNOSIS — N401 Enlarged prostate with lower urinary tract symptoms: Secondary | ICD-10-CM | POA: Diagnosis present

## 2019-01-07 DIAGNOSIS — F1020 Alcohol dependence, uncomplicated: Secondary | ICD-10-CM | POA: Diagnosis present

## 2019-01-07 DIAGNOSIS — D696 Thrombocytopenia, unspecified: Secondary | ICD-10-CM | POA: Diagnosis not present

## 2019-01-07 DIAGNOSIS — R319 Hematuria, unspecified: Secondary | ICD-10-CM | POA: Diagnosis not present

## 2019-01-07 DIAGNOSIS — Z6831 Body mass index (BMI) 31.0-31.9, adult: Secondary | ICD-10-CM | POA: Diagnosis not present

## 2019-01-07 DIAGNOSIS — I5032 Chronic diastolic (congestive) heart failure: Secondary | ICD-10-CM | POA: Diagnosis present

## 2019-01-07 DIAGNOSIS — E876 Hypokalemia: Secondary | ICD-10-CM | POA: Diagnosis present

## 2019-01-07 DIAGNOSIS — N179 Acute kidney failure, unspecified: Secondary | ICD-10-CM

## 2019-01-07 DIAGNOSIS — R34 Anuria and oliguria: Secondary | ICD-10-CM | POA: Diagnosis not present

## 2019-01-07 DIAGNOSIS — I130 Hypertensive heart and chronic kidney disease with heart failure and stage 1 through stage 4 chronic kidney disease, or unspecified chronic kidney disease: Secondary | ICD-10-CM | POA: Diagnosis present

## 2019-01-07 DIAGNOSIS — N183 Chronic kidney disease, stage 3 (moderate): Secondary | ICD-10-CM | POA: Diagnosis present

## 2019-01-07 DIAGNOSIS — K824 Cholesterolosis of gallbladder: Secondary | ICD-10-CM | POA: Diagnosis present

## 2019-01-07 DIAGNOSIS — R945 Abnormal results of liver function studies: Secondary | ICD-10-CM | POA: Diagnosis not present

## 2019-01-07 DIAGNOSIS — N138 Other obstructive and reflux uropathy: Secondary | ICD-10-CM | POA: Diagnosis present

## 2019-01-07 DIAGNOSIS — I361 Nonrheumatic tricuspid (valve) insufficiency: Secondary | ICD-10-CM | POA: Diagnosis not present

## 2019-01-07 DIAGNOSIS — K7589 Other specified inflammatory liver diseases: Secondary | ICD-10-CM | POA: Diagnosis present

## 2019-01-07 DIAGNOSIS — F1010 Alcohol abuse, uncomplicated: Secondary | ICD-10-CM | POA: Diagnosis not present

## 2019-01-07 DIAGNOSIS — D563 Thalassemia minor: Secondary | ICD-10-CM | POA: Diagnosis present

## 2019-01-07 DIAGNOSIS — E785 Hyperlipidemia, unspecified: Secondary | ICD-10-CM | POA: Diagnosis present

## 2019-01-07 DIAGNOSIS — R6510 Systemic inflammatory response syndrome (SIRS) of non-infectious origin without acute organ dysfunction: Secondary | ICD-10-CM

## 2019-01-07 DIAGNOSIS — C61 Malignant neoplasm of prostate: Secondary | ICD-10-CM | POA: Diagnosis present

## 2019-01-07 DIAGNOSIS — M25571 Pain in right ankle and joints of right foot: Secondary | ICD-10-CM | POA: Diagnosis not present

## 2019-01-07 LAB — RAPID URINE DRUG SCREEN, HOSP PERFORMED
Amphetamines: NOT DETECTED
Barbiturates: NOT DETECTED
Benzodiazepines: NOT DETECTED
Cocaine: NOT DETECTED
Opiates: NOT DETECTED
Tetrahydrocannabinol: NOT DETECTED

## 2019-01-07 LAB — URINALYSIS, ROUTINE W REFLEX MICROSCOPIC
Bilirubin Urine: NEGATIVE
Glucose, UA: 50 mg/dL — AB
Ketones, ur: NEGATIVE mg/dL
Leukocytes,Ua: NEGATIVE
Nitrite: NEGATIVE
PH: 7 (ref 5.0–8.0)
Protein, ur: 100 mg/dL — AB
SPECIFIC GRAVITY, URINE: 1.031 — AB (ref 1.005–1.030)

## 2019-01-07 LAB — CBC WITH DIFFERENTIAL/PLATELET
Abs Immature Granulocytes: 0.03 10*3/uL (ref 0.00–0.07)
Basophils Absolute: 0 10*3/uL (ref 0.0–0.1)
Basophils Relative: 1 %
Eosinophils Absolute: 0 10*3/uL (ref 0.0–0.5)
Eosinophils Relative: 0 %
HCT: 36.8 % — ABNORMAL LOW (ref 39.0–52.0)
Hemoglobin: 12 g/dL — ABNORMAL LOW (ref 13.0–17.0)
IMMATURE GRANULOCYTES: 0 %
Lymphocytes Relative: 19 %
Lymphs Abs: 1.4 10*3/uL (ref 0.7–4.0)
MCH: 20.8 pg — ABNORMAL LOW (ref 26.0–34.0)
MCHC: 32.6 g/dL (ref 30.0–36.0)
MCV: 63.7 fL — ABNORMAL LOW (ref 80.0–100.0)
Monocytes Absolute: 0.2 10*3/uL (ref 0.1–1.0)
Monocytes Relative: 3 %
Neutro Abs: 5.8 10*3/uL (ref 1.7–7.7)
Neutrophils Relative %: 77 %
Platelets: 181 10*3/uL (ref 150–400)
RBC: 5.78 MIL/uL (ref 4.22–5.81)
RDW: 17.5 % — AB (ref 11.5–15.5)
WBC: 7.5 10*3/uL (ref 4.0–10.5)
nRBC: 0.3 % — ABNORMAL HIGH (ref 0.0–0.2)

## 2019-01-07 LAB — MAGNESIUM: Magnesium: 2.3 mg/dL (ref 1.7–2.4)

## 2019-01-07 LAB — BASIC METABOLIC PANEL
Anion gap: 15 (ref 5–15)
BUN: 16 mg/dL (ref 8–23)
CO2: 19 mmol/L — AB (ref 22–32)
Calcium: 7.9 mg/dL — ABNORMAL LOW (ref 8.9–10.3)
Chloride: 105 mmol/L (ref 98–111)
Creatinine, Ser: 2.43 mg/dL — ABNORMAL HIGH (ref 0.61–1.24)
GFR calc Af Amer: 32 mL/min — ABNORMAL LOW (ref >60)
GFR calc non Af Amer: 28 mL/min — ABNORMAL LOW (ref >60)
GLUCOSE: 75 mg/dL (ref 70–99)
Potassium: 3.4 mmol/L — ABNORMAL LOW (ref 3.5–5.1)
Sodium: 139 mmol/L (ref 135–145)

## 2019-01-07 LAB — HEPATIC FUNCTION PANEL
ALT: 2784 U/L — ABNORMAL HIGH (ref 0–44)
AST: 8290 U/L — ABNORMAL HIGH (ref 15–41)
Albumin: 3.3 g/dL — ABNORMAL LOW (ref 3.5–5.0)
Alkaline Phosphatase: 129 U/L — ABNORMAL HIGH (ref 38–126)
Bilirubin, Direct: 1.1 mg/dL — ABNORMAL HIGH (ref 0.0–0.2)
Indirect Bilirubin: 1.3 mg/dL — ABNORMAL HIGH (ref 0.3–0.9)
Total Bilirubin: 2.4 mg/dL — ABNORMAL HIGH (ref 0.3–1.2)
Total Protein: 6.2 g/dL — ABNORMAL LOW (ref 6.5–8.1)

## 2019-01-07 LAB — ECHOCARDIOGRAM LIMITED
Height: 65 in
Weight: 2780.8 oz

## 2019-01-07 LAB — TROPONIN I
Troponin I: <0.03 ng/mL (ref <0.03)
Troponin I: 0.03 ng/mL (ref <0.03)
Troponin I: 0.03 ng/mL (ref <0.03)

## 2019-01-07 LAB — LACTIC ACID, PLASMA
Lactic Acid, Venous: 4.3 mmol/L (ref 0.5–1.9)
Lactic Acid, Venous: 2.2 mmol/L (ref 0.5–1.9)

## 2019-01-07 LAB — GLUCOSE, CAPILLARY: GLUCOSE-CAPILLARY: 72 mg/dL (ref 70–99)

## 2019-01-07 LAB — PHOSPHORUS: Phosphorus: 2 mg/dL — ABNORMAL LOW (ref 2.5–4.6)

## 2019-01-07 LAB — GAMMA GT: GGT: 1414 U/L — AB (ref 7–50)

## 2019-01-07 LAB — C-REACTIVE PROTEIN: CRP: <0.8 mg/dL (ref <1.0)

## 2019-01-07 LAB — ACETAMINOPHEN LEVEL

## 2019-01-07 LAB — HIV ANTIBODY (ROUTINE TESTING W REFLEX): HIV Screen 4th Generation wRfx: NONREACTIVE

## 2019-01-07 LAB — SEDIMENTATION RATE: Sed Rate: 1 mm/hr (ref 0–16)

## 2019-01-07 LAB — AMMONIA

## 2019-01-07 MED ORDER — SODIUM PHOSPHATES 45 MMOLE/15ML IV SOLN
10.0000 mmol | Freq: Once | INTRAVENOUS | Status: DC
  Filled 2019-01-07: qty 3.33

## 2019-01-07 MED ORDER — VITAMIN B-1 100 MG PO TABS
100.0000 mg | ORAL_TABLET | Freq: Every day | ORAL | Status: DC
  Administered 2019-01-07 – 2019-01-18 (×12): 100 mg via ORAL
  Filled 2019-01-07 (×12): qty 1

## 2019-01-07 MED ORDER — THIAMINE HCL 100 MG/ML IJ SOLN: 100.0000 mg | Freq: Every day | INTRAMUSCULAR | Status: DC

## 2019-01-07 MED ORDER — SODIUM CHLORIDE 0.9 % IV SOLN
INTRAVENOUS | Status: DC
  Administered 2019-01-07: 03:00:00 via INTRAVENOUS

## 2019-01-07 MED ORDER — POTASSIUM CHLORIDE CRYS ER 20 MEQ PO TBCR
40.0000 meq | EXTENDED_RELEASE_TABLET | Freq: Once | ORAL | Status: AC
  Administered 2019-01-07: 40 meq via ORAL
  Filled 2019-01-07: qty 2

## 2019-01-07 MED ORDER — ADULT MULTIVITAMIN W/MINERALS CH
1.0000 | ORAL_TABLET | Freq: Every day | ORAL | Status: DC
  Administered 2019-01-07 – 2019-01-18 (×12): 1 via ORAL
  Filled 2019-01-07 (×12): qty 1

## 2019-01-07 MED ORDER — LORAZEPAM 2 MG/ML IJ SOLN
1.0000 mg | Freq: Four times a day (QID) | INTRAMUSCULAR | Status: AC | PRN
  Administered 2019-01-07: 1 mg via INTRAVENOUS
  Filled 2019-01-07: qty 1

## 2019-01-07 MED ORDER — ENOXAPARIN SODIUM 40 MG/0.4ML ~~LOC~~ SOLN
40.0000 mg | Freq: Every day | SUBCUTANEOUS | Status: DC
  Administered 2019-01-07: 40 mg via SUBCUTANEOUS
  Filled 2019-01-07: qty 0.4

## 2019-01-07 MED ORDER — NITROGLYCERIN 0.4 MG SL SUBL: 0.4000 mg | SUBLINGUAL_TABLET | SUBLINGUAL | Status: DC | PRN

## 2019-01-07 MED ORDER — SODIUM CHLORIDE 0.9 % IV SOLN
INTRAVENOUS | Status: DC
  Administered 2019-01-07 – 2019-01-08 (×4): via INTRAVENOUS

## 2019-01-07 MED ORDER — LORAZEPAM 1 MG PO TABS
1.0000 mg | ORAL_TABLET | Freq: Four times a day (QID) | ORAL | Status: AC | PRN
  Administered 2019-01-09: 1 mg via ORAL
  Filled 2019-01-07: qty 1

## 2019-01-07 MED ORDER — SODIUM CHLORIDE 0.9 % IV BOLUS
1000.0000 mL | Freq: Once | INTRAVENOUS | Status: AC
  Administered 2019-01-07: 1000 mL via INTRAVENOUS

## 2019-01-07 MED ORDER — FOLIC ACID 1 MG PO TABS
1.0000 mg | ORAL_TABLET | Freq: Every day | ORAL | Status: DC
  Administered 2019-01-07 – 2019-01-18 (×12): 1 mg via ORAL
  Filled 2019-01-07 (×12): qty 1

## 2019-01-07 MED ORDER — FENTANYL CITRATE (PF) 100 MCG/2ML IJ SOLN
12.5000 ug | INTRAMUSCULAR | Status: DC | PRN
  Administered 2019-01-07 – 2019-01-09 (×4): 12.5 ug via INTRAVENOUS
  Filled 2019-01-07 (×5): qty 2

## 2019-01-07 MED ORDER — K PHOS MONO-SOD PHOS DI & MONO 155-852-130 MG PO TABS
500.0000 mg | ORAL_TABLET | Freq: Two times a day (BID) | ORAL | Status: DC
  Administered 2019-01-07: 500 mg via ORAL
  Filled 2019-01-07 (×2): qty 2

## 2019-01-07 MED ORDER — ONDANSETRON HCL 4 MG/2ML IJ SOLN
4.0000 mg | Freq: Four times a day (QID) | INTRAMUSCULAR | Status: DC | PRN
  Administered 2019-01-07 – 2019-01-09 (×3): 4 mg via INTRAVENOUS
  Filled 2019-01-07 (×3): qty 2

## 2019-01-07 NOTE — ED Notes (Signed)
[facility] called and stated, blood specimen is not readable by their equipment because specimen is too "milky". Lab stated if ammonia level is really needed, provider can choose to send to [facility] for analysis.

## 2019-01-07 NOTE — Progress Notes (Signed)
Patient resting comfortably during shift report. Denies complaints.  

## 2019-01-07 NOTE — ED Notes (Addendum)
ED TO INPATIENT HANDOFF REPORT  ED Nurse Name and Phone #: Laverna Peace [phone]  S Name/Age/Gender Bradley Ray 63 y.o. male Room/Bed: 026C/026C  Code Status   Code Status: Full Code  Home/SNF/Other Home Patient oriented to: self, place, time and situation Is this baseline? Yes   Triage Complete: Triage complete  Chief Complaint chest pain  Triage Note [facility]- pt from home. Pt complains of chest pain since 1715. Pt describes it as pressure all over. Pt has been hyperventilating. 10/10 cbest pain. 324 ASA and 0.4 nitro given by EMS. Pt vomited x1.  109/72 113 HR 97 RA 22 RR   Allergies Allergies  Allergen Reactions  . Morphine And Related Rash    Level of Care/Admitting Diagnosis ED Disposition    ED Disposition Condition [facility] Area: [place] [[id number]]  Level of Care: Telemetry Cardiac [103]  Diagnosis: Acute liver failure [[id number]]  Admitting Physician: Shela Leff [[id number]]  Attending Physician: Shela Leff [[id number]]  Estimated length of stay: past midnight tomorrow  Certification:: I certify this patient will need inpatient services for at least 2 midnights  PT Class (Do Not Modify): Inpatient [101]  PT Acc Code (Do Not Modify): Private [1]       B Medical/Surgery History Past Medical History:  Diagnosis Date  . Alcoholism ([place])    per pt as of 10-08-2016 as cut down drinking to 2-3 cans per day --- per epic documentation long hx dependence  . Arthritis    knees  . Beta thalassemia trait    per hematologist/ oncologist note- dr Dorita Sciara--  per blood work-up  dx 02/ 2017  . BPH with obstruction/lower urinary tract symptoms   . Cocaine use    last used as of 10-08-2016 pt stated "had a little powder before christmas"  . ED (erectile dysfunction)   . Hepatitis    states was 30 yrs ago from "food"  . Hyperlipidemia   . Hypertension   . Prostate cancer [facility]) urologist-  dr wrenn/  oncologist-  dr  Tammi Klippel   dx 07-02-2016,  T1c,  Gleason 3+3,  PSA 6.48,  48.44cc  . 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 Wears dentures    upper   Past Surgical History:  Procedure Laterality Date  . CYSTOSCOPY N/A 10/15/2016   Procedure: CYSTOSCOPY FLEXIBLE;  Surgeon: [name], MD;  Location: [facility];  Service: Urology;  Laterality: N/A;  NO SEEDS FOUND IN BLADDER  . ORIF LEFT ANKLE  1980's  . PROSTATE BIOPSY    . RADIOACTIVE SEED IMPLANT N/A 10/15/2016   Procedure: RADIOACTIVE SEED IMPLANT/BRACHYTHERAPY IMPLANT;  Surgeon: [name], MD;  Location: [facility];  Service: Urology;  Laterality: N/A;     66    SEEDS IMPLANTED  . REPAIR EXTENSOR TENDON Right 06/02/2016   Procedure: RIGHT LONG FINGER EXTENSOR TENDON REPAIR;  Surgeon: Milly Jakob, MD;  Location: [place];  Service: Orthopedics;  Laterality: Right;  . TOTAL KNEE ARTHROPLASTY Right 2010  approx.  . TRANSTHORACIC ECHOCARDIOGRAM  11/28/2015   mild focal basal hypertrophy of septum, ef 50-55%, possible hypokinesis of mid inferoseptal and apical septal myocardium, grade 1 diastolic dysfunction/  trivial PR  . TRANSURETHRAL RESECTION OF PROSTATE N/A 06/16/2018   Procedure: TRANSURETHRAL RESECTION OF THE PROSTATE (TURP);  Surgeon: [name], MD;  Location: [facility];  Service: Urology;  Laterality: N/A;     A IV Location/Drains/Wounds Patient Lines/Drains/Airways Status   Active Line/Drains/Airways    Name:   Placement date:   Placement time:   Site:   Days:   Peripheral IV 01/06/19 Left Antecubital   01/06/19    2044    Antecubital   1          Intake/Output Last 24 hours  Intake/Output Summary (Last 24 hours) at 01/07/2019 0123 Last data filed at 01/06/2019 2359 Gross per 24 hour  Intake 1000 ml  Output -  Net 1000 ml    Labs/Imaging Results for orders placed or performed during the hospital  encounter of 01/06/19 (from the past 48 hour(s))  CBC with Differential     Status: Abnormal   Collection Time: 01/06/19  7:03 PM  Result Value Ref Range   WBC 5.5 4.0 - 10.5 K/uL   RBC 5.90 (H) 4.22 - 5.81 MIL/uL   Hemoglobin 13.1 13.0 - 17.0 g/dL   HCT 38.5 (L) 39.0 - 52.0 %   MCV 65.3 (L) 80.0 - 100.0 fL   MCH 22.2 (L) 26.0 - 34.0 pg   MCHC 34.0 30.0 - 36.0 g/dL   RDW 18.6 (H) 11.5 - 15.5 %   Platelets 224 150 - 400 K/uL   nRBC 0.5 (H) 0.0 - 0.2 %   Neutrophils Relative % 66 %   Neutro Abs 3.7 1.7 - 7.7 K/uL   Lymphocytes Relative 29 %   Lymphs Abs 1.6 0.7 - 4.0 K/uL   Monocytes Relative 2 %   Monocytes Absolute 0.1 0.1 - 1.0 K/uL   Eosinophils Relative 1 %   Eosinophils Absolute 0.1 0.0 - 0.5 K/uL   Basophils Relative 1 %   Basophils Absolute 0.0 0.0 - 0.1 K/uL   WBC Morphology INCREASED BANDS (>20% BANDS)    Immature Granulocytes 1 %   Abs Immature Granulocytes 0.03 0.00 - 0.07 K/uL    Comment: Performed at [facility], [address] [place], [place] [address]  Comprehensive metabolic panel     Status: Abnormal   Collection Time: 01/06/19  7:03 PM  Result Value Ref Range   Sodium 143 135 - 145 mmol/L    Comment: POST-ULTRACENTRIFUGATION LIPEMIC SPECIMEN    Potassium 3.2 (L) 3.5 - 5.1 mmol/L   Chloride 101 98 - 111 mmol/L   CO2 17 (L) 22 - 32 mmol/L   Glucose, Bld 103 (H) 70 - 99 mg/dL   BUN 12 8 - 23 mg/dL   Creatinine, Ser 1.28 (H) 0.61 - 1.24 mg/dL   Calcium 8.5 (L) 8.9 - 10.3 mg/dL   Total Protein 6.6 6.5 - 8.1 g/dL   Albumin 3.7 3.5 - 5.0 g/dL   AST 5,520 (H) 15 - 41 U/L    Comment: RESULTS CONFIRMED BY MANUAL DILUTION   ALT 2,204 (H) 0 - 44 U/L   Alkaline Phosphatase 108 38 - 126 U/L   Total Bilirubin 2.2 (H) 0.3 - 1.2 mg/dL   GFR calc non Af Amer >60 >60 mL/min   GFR calc Af Amer >60 >60 mL/min   Anion gap 25 (H) 5 - 15    Comment: RESULT CHECKED Performed at [facility] [address], [place], [place] [address]   Magnesium      Status: None   Collection Time: 01/06/19  7:03 PM  Result Value Ref Range   Magnesium 1.7 1.7 - 2.4 mg/dL    Comment: Performed at [facility]  [facility], [place] [address] [place], [place] [address]  Troponin I - ONCE - STAT     Status: None   Collection Time: 01/06/19  7:03 PM  Result Value Ref Range   Troponin I <0.03 <0.03 ng/mL    Comment: Performed at [facility], [place] [address] [place], [place] [address]  Lipase, blood     Status: None   Collection Time: 01/06/19  7:03 PM  Result Value Ref Range   Lipase 21 11 - 51 U/L    Comment: Performed at [facility] [address], [place], [place] [address]  I-Stat Creatinine, ED (not at [facility])     Status: None   Collection Time: 01/06/19  7:21 PM  Result Value Ref Range   Creatinine, Ser 1.10 0.61 - 4.43 mg/dL  Salicylate level     Status: None   Collection Time: 01/06/19  8:58 PM  Result Value Ref Range   Salicylate Lvl <1.5 2.8 - 30.0 mg/dL    Comment: Performed at [facility] [address] [place], [place] [address]  Ethanol     Status: Abnormal   Collection Time: 01/06/19  8:58 PM  Result Value Ref Range   Alcohol, Ethyl (B) 22 (H) <10 mg/dL    Comment: (NOTE) Lowest detectable limit for serum alcohol is 10 mg/dL. For medical purposes only. Performed at [facility], [place] [address], [place], [place] [address]   Lipid panel     Status: Abnormal   Collection Time: 01/06/19  8:58 PM  Result Value Ref Range   Cholesterol 333 (H) 0 - 200 mg/dL   Triglycerides 4,875 (H) <150 mg/dL    Comment: RESULTS CONFIRMED BY MANUAL DILUTION   HDL 18 (L) >40 mg/dL   Total CHOL/HDL Ratio NOT REPORTED DUE TO HIGH TRIGLYCERIDES RATIO   VLDL UNABLE TO CALCULATE IF TRIGLYCERIDE OVER 400 mg/dL 0 - 40 mg/dL   LDL Cholesterol UNABLE TO CALCULATE IF TRIGLYCERIDE OVER 400 mg/dL 0 - 99 mg/dL    Comment: Performed at [facility] [address], [place], [place] [address]  Protime-INR     Status: None   Collection  Time: 01/06/19  8:58 PM  Result Value Ref Range   Prothrombin Time 13.9 11.4 - 15.2 seconds   INR 1.1 0.8 - 1.2    Comment: (NOTE) INR goal varies based on device and disease states. Performed at [facility], [place] [address] [place], [place] [address]   APTT     Status: None   Collection Time: 01/06/19  8:58 PM  Result Value Ref Range   aPTT 33 24 - 36 seconds    Comment: Performed at [facility] [address] [place], [place] [address]  CK     Status: None   Collection Time: 01/06/19 10:34 PM  Result Value Ref Range   Total CK 248 49 - 397 U/L    Comment: Performed at [facility], [place] [address], [place], [place] [address]  Lactate dehydrogenase     Status: Abnormal   Collection Time: 01/06/19 10:34 PM  Result Value Ref Range   LDH >10,000 (H) 98 - 192 U/L    Comment: RESULTS CONFIRMED BY MANUAL DILUTION Performed at [facility], [place] [address], [place], [place] [address]    Dg Chest 2 View  Result Date: 01/06/2019 CLINICAL DATA:  Chest pain beginning 1715 hours.  Chest pressure. EXAM: CHEST -  2 VIEW COMPARISON:  08/21/2016 FINDINGS: Artifact overlies the chest. Heart size is normal. Mediastinal shadows are normal. The pulmonary vascularity is normal. The lungs are clear. No effusions. No acute bone finding. Relative chronic elevation of the right hemidiaphragm. IMPRESSION: No active cardiopulmonary disease. Electronically Signed   By: Nelson Chimes M.D.   On: 01/06/2019 19:51   Ct Angio Chest/abd/pel For Dissection W And/or W/wo  Result Date: 01/06/2019 CLINICAL DATA:  Chest pain starting at 5:15 p.m. Chest pressure. Vomiting. Assessment for aortic dissection. EXAM: CT ANGIOGRAPHY CHEST, ABDOMEN AND PELVIS TECHNIQUE: Initial multi detector CT imaging of the chest was performed prior to contrast administration. Subsequently, multi detector CT imaging through the chest, abdomen and pelvis was performed using the standard protocol during bolus  administration of intravenous contrast. Multiplanar reconstructed images and MIPs were obtained and reviewed to evaluate the vascular anatomy. CONTRAST:  100 cc Omnipaque 300 COMPARISON:  Multiple exams, including 11/26/2015 CT scan FINDINGS: Vascular opacification is suboptimal due to presumed delayed imaging. I reviewed the images and feel that these are adequate to assess for aortic dissection and do not require a repeat dose of contrast and repeat scan of the chest, abdomen, and pelvis, with such a repeat scan also not guaranteed to catch the contrast bolus perfectly. CTA CHEST FINDINGS Cardiovascular: On noncontrast images, there are no findings of acute intramural hematoma. Coronary, aortic arch, and branch vessel atherosclerotic vascular disease. Coronary artery involvement is primarily in the left anterior descending coronary artery. Mild cardiomegaly is present. There is heterogeneity in the pulmonary arterial tree but also in the pulmonary venous tree, and overall the do not feel that this exam is highly reliable in assessing for pulmonary embolus. It was not protocolled to assess for pulmonary embolus. No thoracic aortic or branch vessel dissection or acute vascular finding is noted. Mediastinum/Nodes: Subtle nonspecific stranding in the left lower neck potentially extending into the superior mediastinum, questionable perivascular distribution. No obvious abscess or pneumomediastinum. No pathologic adenopathy observed. Lungs/Pleura: Mild dependent atelectasis in both lower lobes. Subsegmental atelectasis in the superior segments of both lower lobes. Musculoskeletal: Unremarkable Review of the MIP images confirms the above findings. CTA ABDOMEN AND PELVIS FINDINGS VASCULAR Aorta: Subtle periaortic/retroperitoneal stranding. Atherosclerotic calcification noted. No aneurysm or dissection is visible. No significant aortic stenosis. Celiac: Widely patent celiac tree. SMA: Widely patent. The SMA is thought to  provide at least some of the right hepatic arterial supply. Renals: Patent and unremarkable. IMA: Patent. Inflow: Subtle perivascular stranding along the external iliac arteries and left common femoral artery. Atherosclerotic calcification of the common iliac and common femoral arteries without significant atherosclerotic narrowing. No dissection. Veins: Unremarkable Review of the MIP images confirms the above findings. NON-VASCULAR Hepatobiliary: Unremarkable Pancreas: Unremarkable Spleen: Unremarkable Adrenals/Urinary Tract: 1.7 by 1.3 cm left adrenal adenoma. The kidneys appear unremarkable. Stomach/Bowel: Unremarkable Lymphatic: No pathologic adenopathy is observed. Reproductive: Prostate brachytherapy seed implants noted. Other: No supplemental non-categorized findings. Musculoskeletal: Partial bony bridging at L5-S1 with facet and intervertebral spurring resulting in mild bilateral foraminal impingement. Lesser foraminal narrowing at L3-4 and L4-5. Review of the MIP images confirms the above findings. IMPRESSION: 1. No vascular dissection is identified. 2. There is some unusual perivascular stranding along the abdominal aorta, left common femoral artery, and possibly along the left lower neck extending into the upper mediastinum. Although nonspecific, the possibility of large vessel vasculitis is not totally excluded, and correlation with sedimentation rate or other clinical indicators of possible arteritis is suggested. No aneurysm is observed.  3. The contrast bolus was mildly delayed, suboptimal but adequate for assessing the systemic arteries/aorta. Today's exam is not considered highly sensitive in assessing the pulmonary arteries due to the delayed contrast phase. I did not repeat dose the patient given the mildly elevated creatinine and the additional radiation burden rescanning the entire chest, abdomen, and pelvis would incur. 4. Aortic Atherosclerosis (ICD10-I70.0). Coronary atherosclerosis especially  in the left anterior descending coronary artery, with mild cardiomegaly. 5. Lower lumbar bony foraminal narrowing. Electronically Signed   By: Van Clines M.D.   On: 01/06/2019 21:58    Pending Labs Unresulted Labs (From admission, onward)    Start     Ordered   01/07/19 5102  Basic metabolic panel  Tomorrow morning,   R     01/07/19 0102   01/07/19 0500  Hepatic function panel  Tomorrow morning,   R     01/07/19 0102   01/07/19 0500  Sedimentation rate  Tomorrow morning,   R     01/07/19 0102   01/07/19 0500  C-reactive protein  Tomorrow morning,   R     01/07/19 0102   01/07/19 0102  Lactic acid, plasma  Once,   R     01/07/19 0102   01/07/19 0102  Ammonia  Once,   R     01/07/19 0102   01/07/19 0102  Phosphorus  Once,   R     01/07/19 0102   01/07/19 0102  HIV Antibody (routine testing w rflx)  Once,   R     01/07/19 0102   01/07/19 0100  Urine rapid drug screen (hosp performed)  ONCE - STAT,   R     01/07/19 0102   01/07/19 0100  Magnesium  Add-on,   R     01/07/19 0102   01/07/19 0059  Troponin I - Now Then Q6H  Now then every 6 hours,   R     01/07/19 0102   01/06/19 2044  Hepatitis panel, acute  ONCE - STAT,   STAT     01/06/19 2043          Vitals/Pain Today's Vitals   01/07/19 0030 01/07/19 0045 01/07/19 0100 01/07/19 0115  BP: 140/88 (!) 147/113 131/84   Pulse: (!) 117 (!) 119 (!) 111 (!) 117  Resp: 20 (!) 21  (!) 21  Temp:      TempSrc:      SpO2: 96% 95% 92% 95%  Weight:      Height:      PainSc:        Isolation Precautions No active isolations  Medications Medications  enoxaparin (LOVENOX) injection 40 mg (has no administration in time range)  LORazepam (ATIVAN) tablet 1 mg (has no administration in time range)    Or  LORazepam (ATIVAN) injection 1 mg (has no administration in time range)  thiamine (VITAMIN B-1) tablet 100 mg (has no administration in time range)    Or  thiamine (B-1) injection 100 mg (has no administration in time  range)  folic acid (FOLVITE) tablet 1 mg (has no administration in time range)  multivitamin with minerals tablet 1 tablet (has no administration in time range)  potassium chloride SA (K-DUR,KLOR-CON) CR tablet 40 mEq (has no administration in time range)  0.9 %  sodium chloride infusion (has no administration in time range)  fentaNYL (SUBLIMAZE) injection 50 mcg (50 mcg Intravenous Given 01/06/19 1934)  sodium chloride 0.9 % bolus 1,000 mL (0 mLs Intravenous Stopped 01/06/19  2359)  iohexol (OMNIPAQUE) 350 MG/ML injection 100 mL (100 mLs Intravenous Contrast Given 01/06/19 2153)    Mobility walks Low fall risk   Focused Assessments Cardiac Assessment Handoff:  Cardiac Rhythm: Sinus tachycardia Lab Results  Component Value Date   CKTOTAL 248 01/06/2019   TROPONINI <0.03 01/06/2019   Lab Results  Component Value Date   DDIMER <0.20 04/01/2016   Does the Patient currently have chest pain? No     R Recommendations: See Admitting Provider Note  Report given to: Varney Biles RN  Additional Notes:

## 2019-01-07 NOTE — ED Notes (Signed)
Pt denies chest pain but states has back pain from sitting on stretcher.

## 2019-01-07 NOTE — Progress Notes (Signed)
PROGRESS NOTE    Bradley Ray  UYE:[id number] DOB: [birth year] DOA: 01/06/2019 PCP: Nolene Ebbs, MD   Brief Narrative:  HPI per Dr. Shela Leff on 01/06/2019 Bradley Ray is a 63 y.o. male with medical history significant of polysubstance use, hypertension, hyperlipidemia, and conditions listed below presenting to the hospital for evaluation of chest pain.  Patient states yesterday around 4 PM he was sitting and watching TV and experienced acute onset pressure across his chest and upper abdomen.  Denies any shortness of breath or nausea at that time.  States this lasted about 45 minutes and when EMS gave him nitroglycerin he vomited.  Denies any history of CAD or prior MI.  States he quit smoking 8 years ago.  States his maternal aunt had CAD.  Denies any fevers or recent sick contacts.  States he was not having any nausea, vomiting, or abdominal pain at home but does report feeling tired lately.  States he drinks 2-3 shots of whiskey every day but yesterday drank more than usual because it was his friend's birthday.  He had a pint of liquor yesterday.  States he smokes marijuana occasionally.  Denies any other drug use. Patient denies any acetaminophen use.  Denies use of any other over-the-counter medications or supplements.  Denies any recent antibiotic use.  **LFTs are worsening so will obtain right upper quadrant ultrasound and continue IV fluid hydration.  Function is also affected and worsened likely in the setting of contrast for CTA  Assessment & Plan:   Principal Problem:   Shock liver Active Problems:   Hypertriglyceridemia   Chest pain   SIRS (systemic inflammatory response syndrome) (HCC)   AKI (acute kidney injury) ([facility])  Elevated LFTs in the Setting of shock liver: -On Admission AST 5520 and ALT 2204. T bili 2.2.  Coags normal. -Repeat this AM showed AST of 8,290, ALT of 2,784, T Bili of 2.4 (Direct of 1.3 and Indirect of 1.1), GGT of 1,414, and Alk Phos of 129    -Platelet count normal at 224 and dropped to 181.   -No encephalopathy.  Salicylate level negative.   -Ethanol level mildly elevated at 22.  CK normal.    Lactic acid 4.3.   -Phosphorus level 2.0 and replet.   -Extremely high LDH (greater than 10,000).  -Patient denies any acetaminophen use.   -Denies use of any other over-the-counter medications or supplements.  Denies any recent antibiotic use.   -States he smokes marijuana occasionally but denies any other drug use. -UDS Negative -Acute Hepatitis Panel Pending; Has a Hx of Hepatitis and does have Alcoholism -Acetaminophen Level done and <10 and Ammonia Level was not Done -Continue IV fluid hydration with normal saline but rate was increased by Gastroenterology to 150 mL's per hour -Will initiate Workup for Autoimmune Hepatitis -CT Scan of Chest/Abd/Pelvis showed Unremarkable Hepatobiliary Region and Pancreas -Gastroenterology consulted for further evaluation and recommendations and recommending Increasing IVF and considering Nephrology consultation -Avoid all Hepatotoxic and Nephrotoxic Medications  -Will Check RUQ U/S -Will Continue to Monitor and Trend Renal Fxn  Severe Hypertriglyceridemia/HLD -Lipid panel showing triglycerides 4875, cholesterol 333, and HDL 18. ?whether related to shock liver. Unable to Calculate LDL -Encouraged cessation of alcohol use.   -Low-fat diet.  -Avoid starting a statin or fibrate at this time in the setting of acute liver failure. -Takes Atorvastatin 40 mg po qHS as an outpatient and currently being held  Chest pain -Does have significant risk factors for CAD including age, gender, hypertension,  hyperlipidemia, prior tobacco use, and family history.   -Troponin x 2 negative and repeats flat at 0.03 x2.   EKG not suggestive of ACS.  Currently not complaining of chest pain.   -Chest x-ray showing no active cardiopulmonary disease.   -CT angiogram negative for dissection and inconclusive for PE.  -Not  hypoxic.   -Lipid panel showing low HDL and severe hypertriglyceridemia.   -Echo done in February 2017 with possible hypokinesis of the mid inferoseptal and apical septal myocardium.  -Received aspirin 324 mg and 0.4 mg nitro by EMS. -Cardiac monitoring.   -C/w Sublingual nitroglycerin as needed.   -Repeating Echocardiogram this visit -May discuss with Cardiology after GI Issues are resolved   SIRS -Afebrile.  No leukocytosis.   -Tachycardic and tachypneic.  Not hypotensive.   -Lactic acid elevated at 4.3 in the setting of shock liver.   -Chest x-ray not suggestive of pneumonia. -C/w IVF as below -Continue to trend lactate and trended down.   -Check UA. -Continue to monitor hemodynamics.  AKI -On Admission Creatinine 1.10, baseline 0.8. -BUN/Cr is now 16/2.43 and worsened in the setting of Contrast Exposure -Avoid nephrotoxic agents and contrast agentes -C/w IVF Hydration with NS at 150 mL/hr (increased from 100 by Gastroenterology) -Continue to Monitor and Trend Renal Fxn -Repeat CMP in AM   Hypokalemia -Potassium 3.2 on admission and is now 3.4.   -Magnesium within normal range at 2.3. -Replete with po Potassium Chloride 40 mEQ and po KPhos Neutral 500 mg po BID x2 -Continue to Monitor and Replete as Necessary -Repeat CMP in AM  Hypophosphatemia -Patient's Phos Level was 2.0 -Replete with po KPhos Neutral 500 mg po BID -Continue to Montior and Replete as Necessary -Repeat Phos Level in AM    High anion gap metabolic acidosis -Suspect secondary to lactic acidosis from shock liver.  -Salicylate level negative.   -On Admission Bicarb 17, anion gap 25.  Blood glucose 103. -Now AG is 15 and CO2 is 33 -C/w IVF Hydration  Concern for Large vessel vasculitis -CT showing some unusual perivascular stranding along the abdominal aorta, left common femoral artery, and possibly along the left lower neck extending into the upper mediastinum; suspicion for large vessel  vasculitis. -Checked ESR and CRP levels and CRP was <0.8 and ESR was 1 -Continue to Monitor and will consider further workup  Alcohol Dependence/Alcoholism -Drinks at least a pint a day -C/w CIWA monitoring per PRotocol; Ativan PRN -C/w Thiamine, folate, multivitamin.  DVT prophylaxis: SCDs; Enoxaparin 40 mg sq q24h Code Status: FULL CODE Family Communication: No family present at bedside  Disposition Plan: Remain Inpatient for continued workup and treatment and evaluation of Abnormal LFT's, Abdominal Pain   Consultants:   Gastroenterology   Procedures:  ECHOCARDIOGRAM IMPRESSIONS    1. The left ventricle has normal systolic function, with an ejection fraction of 55-60%. The cavity size was mildly dilated. Left ventricular diastolic Doppler parameters are consistent with impaired relaxation.  2. The right ventricle has normal systolc function. The cavity was normal. There is no increase in right ventricular wall thickness.  3. No evidence present in the left atrial appendage.  4. There is mild to moderate mitral annular calcification present.  5. The aortic valve is tricuspid Mild sclerosis of the aortic valve. Aortic valve regurgitation was not assessed by color flow Doppler.  6. No pulmonic valve vegetation visualized.  7. The interatrial septum appears to be lipomatous.  FINDINGS  Left Ventricle: The left ventricle has normal systolic function, with  an ejection fraction of 55-60%. The cavity size was mildly dilated. There is no increase in left ventricular wall thickness. Left ventricular diastolic Doppler parameters are  consistent with impaired relaxation (grade I). Normal left ventricular filling pressures.   Right Ventricle: The right ventricle has normal systolic function. The cavity was normal. There is no increase in right ventricular wall thickness. Left Atrium: Left atrial size was normal in size. Left Atrial Appendage: No evidence of a thrombus present in the  left atrial appendage. Right Atrium: Right atrial size was normal in size. Right atrial pressure is estimated at 3 mmHg. Interatrial Septum: No atrial level shunt detected by color flow Doppler. Increased thickness of the atrial septum sparing the fossa ovalis consistent with The interatrial septum appears to be lipomatous. Pericardium: There is no evidence of pericardial effusion. Mitral Valve: The mitral valve is normal in structure. There is mild to moderate mitral annular calcification present. Mitral valve regurgitation is trivial by color flow Doppler. Tricuspid Valve: The tricuspid valve was normal in structure. Tricuspid valve regurgitation is mild by color flow Doppler. Aortic Valve: The aortic valve is tricuspid Mild sclerosis of the aortic valve. Aortic valve regurgitation was not assessed by color flow Doppler. Pulmonic Valve: The pulmonic valve was normal in structure. Pulmonic valve regurgitation is trivial by color flow Doppler. No pulmonic valve vegetation visualized. Venous: The inferior vena cava is normal in size with greater than 50% respiratory variability.   LEFT VENTRICLE PLAX 2D LVIDd:         5.50 cm  Diastology LVIDs:         3.80 cm  LV e' lateral:   7.07 cm/s LV PW:         0.90 cm  LV E/e' lateral: 8.2 LV IVS:        0.90 cm  LV e' medial:    7.72 cm/s LVOT diam:     2.30 cm  LV E/e' medial:  7.6 LV SV:         85 ml LV SV Index:   44.43 LVOT Area:     4.15 cm  LEFT ATRIUM         Index      RIGHT ATRIUM LA diam:    4.30 cm 2.31 cm/m RA Pressure: 3 mmHg  AORTIC VALVE LVOT Vmax:   92.70 cm/s LVOT Vmean:  63.400 cm/s LVOT VTI:    0.179 m   AORTA Ao Root diam: 3.70 cm  MV E velocity: 58.30 cm/s MV A velocity: 102.00 cm/s SHUNTS MV E/A ratio:  0.57        Systemic VTI:  0.18 m                            Systemic Diam: 2.30 cm   Antimicrobials:  Anti-infectives (From admission, onward)   None     Subjective: Seen and examined at bedside and was  still complaining of some abdominal pain epigastric in nature more on the right side compared to the left.  No lightheadedness or dizziness.  States that his chest pressure is improved but mainly is his abdominal pain that is hurting now.  No other concerns or complaints at this time.  Objective: Vitals:   01/07/19 0427 01/07/19 0638 01/07/19 0744 01/07/19 1206  BP: 135/87  127/86 139/90  Pulse: 98  88 88  Resp: '18  18 20  ' Temp: 98.6 F (37 C)  98.2 F (36.8 C) 98  F (36.7 C)  TempSrc: Oral  Oral Oral  SpO2: 96%  97% 97%  Weight:  78.8 kg    Height:  '5\' 5"'  (1.651 m)      Intake/Output Summary (Last 24 hours) at 01/07/2019 1452 Last data filed at 01/07/2019 1231 Gross per 24 hour  Intake 4000.27 ml  Output -  Net 4000.27 ml   Filed Weights   01/06/19 1834 01/07/19 0225 01/07/19 7408  Weight: 80.7 kg 78.8 kg 78.8 kg   Examination: Physical Exam:  Constitutional: WN/WD overweight AAM in NAD and appears calm but uncomfortable Eyes: Lids and conjunctivae normal, sclerae anicteric  ENMT: External Ears, Nose appear normal. Grossly normal hearing. Mucous membranes are moist. Neck: Appears normal, supple, no cervical masses, normal ROM, no appreciable thyromegaly; no JVD Respiratory: Diminished to auscultation bilaterally, no wheezing, rales, rhonchi or crackles. Normal respiratory effort and patient is not tachypenic. No accessory muscle use.  Cardiovascular: RRR, no murmurs / rubs / gallops. S1 and S2 auscultated. No extremity edema.  Abdomen: Soft, Tender to palpate in the , non-distended. No masses palpated. No appreciable hepatosplenomegaly. Bowel sounds positive x4.  GU: Deferred. Musculoskeletal: No clubbing / cyanosis of digits/nails. No joint deformity upper and lower extremities. .  Skin: No rashes, lesions, ulcers on a limited skin eval. No induration; Warm and dry.  Neurologic: CN 2-12 grossly intact with no focal deficits.  Romberg sign ajnd cerebellar reflexes not  assessed.  Psychiatric: Normal judgment and insight. Alert and oriented x 3. Normal mood and appropriate affect.   Data Reviewed: I have personally reviewed following labs and imaging studies  CBC: Recent Labs  Lab 01/06/19 1903 01/07/19 0945  WBC 5.5 7.5  NEUTROABS 3.7 5.8  HGB 13.1 12.0*  HCT 38.5* 36.8*  MCV 65.3* 63.7*  PLT 224 144   Basic Metabolic Panel: Recent Labs  Lab 01/06/19 1903 01/06/19 1921 01/07/19 0114 01/07/19 0555  NA 143  --   --  139  K 3.2*  --   --  3.4*  CL 101  --   --  105  CO2 17*  --   --  19*  GLUCOSE 103*  --   --  75  BUN 12  --   --  16  CREATININE 1.28* 1.10  --  2.43*  CALCIUM 8.5*  --   --  7.9*  MG 1.7  --  2.3  --   PHOS  --   --  2.0*  --    GFR: Estimated Creatinine Clearance: 30.9 mL/min (A) (by C-G formula based on SCr of 2.43 mg/dL (H)). Liver Function Tests: Recent Labs  Lab 01/06/19 1903 01/07/19 0555  AST 5,520* 8,290*  ALT 2,204* 2,784*  ALKPHOS 108 129*  BILITOT 2.2* 2.4*  PROT 6.6 6.2*  ALBUMIN 3.7 3.3*   Recent Labs  Lab 01/06/19 1903  LIPASE 21   Recent Labs  Lab 01/07/19 0555  AMMONIA RESULTS UNAVAILABLE DUE TO INTERFERING SUBSTANCE   Coagulation Profile: Recent Labs  Lab 01/06/19 2058  INR 1.1   Cardiac Enzymes: Recent Labs  Lab 01/06/19 1903 01/06/19 2234 01/07/19 0114 01/07/19 0555 01/07/19 1240  CKTOTAL  --  248  --   --   --   TROPONINI <0.03  --  <0.03 0.03* 0.03*   BNP (last 3 results) No results for input(s): PROBNP in the last 8760 hours. HbA1C: No results for input(s): HGBA1C in the last 72 hours. CBG: Recent Labs  Lab 01/07/19 0618  GLUCAP 72  Lipid Profile: Recent Labs    01/06/19 2058  CHOL 333*  HDL 18*  LDLCALC UNABLE TO CALCULATE IF TRIGLYCERIDE OVER 400 mg/dL  TRIG 4,875*  CHOLHDL NOT REPORTED DUE TO HIGH TRIGLYCERIDES   Thyroid Function Tests: No results for input(s): TSH, T4TOTAL, FREET4, T3FREE, THYROIDAB in the last 72 hours. Anemia Panel: No  results for input(s): VITAMINB12, FOLATE, FERRITIN, TIBC, IRON, RETICCTPCT in the last 72 hours. Sepsis Labs: Recent Labs  Lab 01/07/19 0114 01/07/19 0555  LATICACIDVEN 4.3* 2.2*    No results found for this or any previous visit (from the past 240 hour(s)).   Radiology Studies: Dg Chest 2 View  Result Date: 01/06/2019 CLINICAL DATA:  Chest pain beginning 1715 hours.  Chest pressure. EXAM: CHEST - 2 VIEW COMPARISON:  08/21/2016 FINDINGS: Artifact overlies the chest. Heart size is normal. Mediastinal shadows are normal. The pulmonary vascularity is normal. The lungs are clear. No effusions. No acute bone finding. Relative chronic elevation of the right hemidiaphragm. IMPRESSION: No active cardiopulmonary disease. Electronically Signed   By: Nelson Chimes M.D.   On: 01/06/2019 19:51   Ct Angio Chest/abd/pel For Dissection W And/or W/wo  Result Date: 01/06/2019 CLINICAL DATA:  Chest pain starting at 5:15 p.m. Chest pressure. Vomiting. Assessment for aortic dissection. EXAM: CT ANGIOGRAPHY CHEST, ABDOMEN AND PELVIS TECHNIQUE: Initial multi detector CT imaging of the chest was performed prior to contrast administration. Subsequently, multi detector CT imaging through the chest, abdomen and pelvis was performed using the standard protocol during bolus administration of intravenous contrast. Multiplanar reconstructed images and MIPs were obtained and reviewed to evaluate the vascular anatomy. CONTRAST:  100 cc Omnipaque 300 COMPARISON:  Multiple exams, including 11/26/2015 CT scan FINDINGS: Vascular opacification is suboptimal due to presumed delayed imaging. I reviewed the images and feel that these are adequate to assess for aortic dissection and do not require a repeat dose of contrast and repeat scan of the chest, abdomen, and pelvis, with such a repeat scan also not guaranteed to catch the contrast bolus perfectly. CTA CHEST FINDINGS Cardiovascular: On noncontrast images, there are no findings of  acute intramural hematoma. Coronary, aortic arch, and branch vessel atherosclerotic vascular disease. Coronary artery involvement is primarily in the left anterior descending coronary artery. Mild cardiomegaly is present. There is heterogeneity in the pulmonary arterial tree but also in the pulmonary venous tree, and overall the do not feel that this exam is highly reliable in assessing for pulmonary embolus. It was not protocolled to assess for pulmonary embolus. No thoracic aortic or branch vessel dissection or acute vascular finding is noted. Mediastinum/Nodes: Subtle nonspecific stranding in the left lower neck potentially extending into the superior mediastinum, questionable perivascular distribution. No obvious abscess or pneumomediastinum. No pathologic adenopathy observed. Lungs/Pleura: Mild dependent atelectasis in both lower lobes. Subsegmental atelectasis in the superior segments of both lower lobes. Musculoskeletal: Unremarkable Review of the MIP images confirms the above findings. CTA ABDOMEN AND PELVIS FINDINGS VASCULAR Aorta: Subtle periaortic/retroperitoneal stranding. Atherosclerotic calcification noted. No aneurysm or dissection is visible. No significant aortic stenosis. Celiac: Widely patent celiac tree. SMA: Widely patent. The SMA is thought to provide at least some of the right hepatic arterial supply. Renals: Patent and unremarkable. IMA: Patent. Inflow: Subtle perivascular stranding along the external iliac arteries and left common femoral artery. Atherosclerotic calcification of the common iliac and common femoral arteries without significant atherosclerotic narrowing. No dissection. Veins: Unremarkable Review of the MIP images confirms the above findings. NON-VASCULAR Hepatobiliary: Unremarkable Pancreas: Unremarkable Spleen: Unremarkable  Adrenals/Urinary Tract: 1.7 by 1.3 cm left adrenal adenoma. The kidneys appear unremarkable. Stomach/Bowel: Unremarkable Lymphatic: No pathologic  adenopathy is observed. Reproductive: Prostate brachytherapy seed implants noted. Other: No supplemental non-categorized findings. Musculoskeletal: Partial bony bridging at L5-S1 with facet and intervertebral spurring resulting in mild bilateral foraminal impingement. Lesser foraminal narrowing at L3-4 and L4-5. Review of the MIP images confirms the above findings. IMPRESSION: 1. No vascular dissection is identified. 2. There is some unusual perivascular stranding along the abdominal aorta, left common femoral artery, and possibly along the left lower neck extending into the upper mediastinum. Although nonspecific, the possibility of large vessel vasculitis is not totally excluded, and correlation with sedimentation rate or other clinical indicators of possible arteritis is suggested. No aneurysm is observed. 3. The contrast bolus was mildly delayed, suboptimal but adequate for assessing the systemic arteries/aorta. Today's exam is not considered highly sensitive in assessing the pulmonary arteries due to the delayed contrast phase. I did not repeat dose the patient given the mildly elevated creatinine and the additional radiation burden rescanning the entire chest, abdomen, and pelvis would incur. 4. Aortic Atherosclerosis (ICD10-I70.0). Coronary atherosclerosis especially in the left anterior descending coronary artery, with mild cardiomegaly. 5. Lower lumbar bony foraminal narrowing. Electronically Signed   By: Van Clines M.D.   On: 01/06/2019 21:58   Scheduled Meds: . enoxaparin (LOVENOX) injection  40 mg Subcutaneous Daily  . folic acid  1 mg Oral Daily  . multivitamin with minerals  1 tablet Oral Daily  . phosphorus  500 mg Oral BID  . thiamine  100 mg Oral Daily   Or  . thiamine  100 mg Intravenous Daily   Continuous Infusions: . sodium chloride 150 mL/hr at 01/07/19 1244    LOS: 0 days   Kerney Elbe, DO [facility] PAGER is on [place]  If 7PM-7AM, please contact  night-coverage [email or web address] Password [facility] 01/07/2019, 2:52 PM

## 2019-01-07 NOTE — Progress Notes (Signed)
MD aware of pt request for pain medication.

## 2019-01-07 NOTE — Progress Notes (Signed)
CRITICAL VALUE ALERT  Critical Value:  Lactic acid =2.2  Date & Time Notied:  01/07/19 0650  Provider Notified: Ninetta Lights  Orders Received/Actions taken: Notified via text page no new orders given at this time

## 2019-01-07 NOTE — Progress Notes (Signed)
  Echocardiogram 2D Echocardiogram has been performed.  Bradley Ray 01/07/2019, 9:42 AM

## 2019-01-07 NOTE — ED Notes (Signed)
Pt given urinal.

## 2019-01-07 NOTE — Progress Notes (Signed)
CRITICAL VALUE ALERT  Critical Value:  Trop 0.03  Date & Time Notied:  01/07/19 8:25 AM  Provider Notified: O.Sheikh,MD  Orders Received/Actions taken: MD to round soon.

## 2019-01-07 NOTE — Consult Note (Signed)
Reason for Consult: Increased liver tests Referring Physician: Hospital team  KYRESE GARTMAN is an 63 y.o. male.  HPI: Patient seen and examined in hospital computer chart reviewed and initially I talked to the ER physician who told me his Tylenol level was negative but the hospital team called me back to discuss the case and said there was no Tylenol level and I recommend starting Mucomyst ASAP and not wait for the Tylenol level but then the Tylenol level came back negative and the patient denies taking anything and he says he just drank too much alcohol but denies drinking anything else that was not bought in a store and he did have a bout of hepatitis in high school but is never been told he had a liver problem otherwise and his chest pain is better and he does have some minimal upper abdominal discomfort and is urinating a little bit but did not pass out and has no other complaints and no obvious liver problems run in the family and he denies any new medicine or new over-the-counter herbs or vitamins that he has been taking  Past Medical History:  Diagnosis Date  . Alcoholism ([place])    per pt as of 10-08-2016 as cut down drinking to 2-3 cans per day --- per epic documentation long hx dependence  . Arthritis    knees  . Beta thalassemia trait    per hematologist/ oncologist note- dr Dorita Sciara--  per blood work-up  dx 02/ 2017  . BPH with obstruction/lower urinary tract symptoms   . Cocaine use    last used as of 10-08-2016 pt stated "had a little powder before christmas"  . ED (erectile dysfunction)   . Hepatitis    states was 30 yrs ago from "food"  . Hyperlipidemia   . Hypertension   . Prostate cancer [facility]) urologist-  dr wrenn/  oncologist-  dr Tammi Klippel   dx 07-02-2016,  T1c,  Gleason 3+3,  PSA 6.48,  48.44cc  . 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 Wears dentures     upper    Past Surgical History:  Procedure Laterality Date  . CYSTOSCOPY N/A 10/15/2016   Procedure: CYSTOSCOPY FLEXIBLE;  Surgeon: [name], MD;  Location: [facility];  Service: Urology;  Laterality: N/A;  NO SEEDS FOUND IN BLADDER  . ORIF LEFT ANKLE  1980's  . PROSTATE BIOPSY    . RADIOACTIVE SEED IMPLANT N/A 10/15/2016   Procedure: RADIOACTIVE SEED IMPLANT/BRACHYTHERAPY IMPLANT;  Surgeon: [name], MD;  Location: [facility];  Service: Urology;  Laterality: N/A;     66    SEEDS IMPLANTED  . REPAIR EXTENSOR TENDON Right 06/02/2016   Procedure: RIGHT LONG FINGER EXTENSOR TENDON REPAIR;  Surgeon: Milly Jakob, MD;  Location: [place];  Service: Orthopedics;  Laterality: Right;  . TOTAL KNEE ARTHROPLASTY Right 2010  approx.  . TRANSTHORACIC ECHOCARDIOGRAM  11/28/2015   mild focal basal hypertrophy of septum, ef 50-55%, possible hypokinesis of mid inferoseptal and apical septal myocardium, grade 1 diastolic dysfunction/  trivial PR  . TRANSURETHRAL RESECTION OF PROSTATE N/A 06/16/2018   Procedure: TRANSURETHRAL RESECTION OF THE PROSTATE (TURP);  Surgeon: [name], MD;  Location: [facility];  Service: Urology;  Laterality: N/A;    Family History  Problem Relation Age of Onset  . Arrhythmia Mother        Has  pacer  . Throat cancer Father   . Pancreatic cancer Sister   . Clotting disorder Sister        related knee surgeries  . Cancer Sister        pancreatic  . Sickle cell trait Son   . Cancer Maternal Aunt   . Cancer Maternal Uncle   . Cancer Other     Social History:  reports that he quit smoking about 12 years ago. His smoking use included cigarettes. He has a 30.00 pack-year smoking history. He has never used smokeless tobacco. He reports current alcohol use of about 16.0 - 23.0 standard drinks of alcohol per week. He reports previous drug use. Drugs: "Crack" cocaine and Marijuana.  Allergies:  Allergies  Allergen Reactions  . Morphine And Related Rash     Medications: I have reviewed the patient's current medications.  Results for orders placed or performed during the hospital encounter of 01/06/19 (from the past 48 hour(s))  CBC with Differential     Status: Abnormal   Collection Time: 01/06/19  7:03 PM  Result Value Ref Range   WBC 5.5 4.0 - 10.5 K/uL   RBC 5.90 (H) 4.22 - 5.81 MIL/uL   Hemoglobin 13.1 13.0 - 17.0 g/dL   HCT 38.5 (L) 39.0 - 52.0 %   MCV 65.3 (L) 80.0 - 100.0 fL   MCH 22.2 (L) 26.0 - 34.0 pg   MCHC 34.0 30.0 - 36.0 g/dL   RDW 18.6 (H) 11.5 - 15.5 %   Platelets 224 150 - 400 K/uL   nRBC 0.5 (H) 0.0 - 0.2 %   Neutrophils Relative % 66 %   Neutro Abs 3.7 1.7 - 7.7 K/uL   Lymphocytes Relative 29 %   Lymphs Abs 1.6 0.7 - 4.0 K/uL   Monocytes Relative 2 %   Monocytes Absolute 0.1 0.1 - 1.0 K/uL   Eosinophils Relative 1 %   Eosinophils Absolute 0.1 0.0 - 0.5 K/uL   Basophils Relative 1 %   Basophils Absolute 0.0 0.0 - 0.1 K/uL   WBC Morphology INCREASED BANDS (>20% BANDS)    Immature Granulocytes 1 %   Abs Immature Granulocytes 0.03 0.00 - 0.07 K/uL    Comment: Performed at [facility], [address] [place], [place] [address]  Comprehensive metabolic panel     Status: Abnormal   Collection Time: 01/06/19  7:03 PM  Result Value Ref Range   Sodium 143 135 - 145 mmol/L    Comment: POST-ULTRACENTRIFUGATION LIPEMIC SPECIMEN    Potassium 3.2 (L) 3.5 - 5.1 mmol/L   Chloride 101 98 - 111 mmol/L   CO2 17 (L) 22 - 32 mmol/L   Glucose, Bld 103 (H) 70 - 99 mg/dL   BUN 12 8 - 23 mg/dL   Creatinine, Ser 1.28 (H) 0.61 - 1.24 mg/dL   Calcium 8.5 (L) 8.9 - 10.3 mg/dL   Total Protein 6.6 6.5 - 8.1 g/dL   Albumin 3.7 3.5 - 5.0 g/dL   AST 5,520 (H) 15 - 41 U/L    Comment: RESULTS CONFIRMED BY MANUAL DILUTION   ALT 2,204 (H) 0 - 44 U/L   Alkaline Phosphatase 108 38 - 126 U/L   Total Bilirubin 2.2 (H) 0.3 - 1.2 mg/dL   GFR calc non Af Amer >60 >60 mL/min   GFR calc Af Amer >60 >60 mL/min   Anion gap 25 (H) 5 -  15    Comment: RESULT CHECKED Performed at [facility], [address] [place], [place] [address]   Magnesium     Status: None   Collection Time: 01/06/19  7:03 PM  Result Value Ref Range   Magnesium 1.7 1.7 - 2.4 mg/dL    Comment: Performed at [facility], [place] [address], [place], [place] [address]  Troponin I - ONCE - STAT     Status: None   Collection Time: 01/06/19  7:03 PM  Result Value Ref Range   Troponin I <0.03 <0.03 ng/mL    Comment: Performed at [facility], [place] [address] [place], [place] [address]  Lipase, blood     Status: None   Collection Time: 01/06/19  7:03 PM  Result Value Ref Range   Lipase 21 11 - 51 U/L    Comment: Performed at [facility] [place] [address] [place], [place] [address]  I-Stat Creatinine, ED (not at [facility] [place])     Status: None   Collection Time: 01/06/19  7:21 PM  Result Value Ref Range   Creatinine, Ser 1.10 0.61 - 8.41 mg/dL  Salicylate level     Status: None   Collection Time: 01/06/19  8:58 PM  Result Value Ref Range   Salicylate Lvl <6.6 2.8 - 30.0 mg/dL    Comment: Performed at [facility] [address] [place], [place] [address]  Ethanol     Status: Abnormal   Collection Time: 01/06/19  8:58 PM  Result Value Ref Range   Alcohol, Ethyl (B) 22 (H) <10 mg/dL    Comment: (NOTE) Lowest detectable limit for serum alcohol is 10 mg/dL. For medical purposes only. Performed at [facility], [place] [address], [place], [place] [address]   Lipid panel     Status: Abnormal   Collection Time: 01/06/19  8:58 PM  Result Value Ref Range   Cholesterol 333 (H) 0 - 200 mg/dL   Triglycerides 4,875 (H) <150 mg/dL    Comment: RESULTS CONFIRMED BY MANUAL DILUTION   HDL 18 (L) >40 mg/dL   Total CHOL/HDL Ratio NOT REPORTED DUE TO HIGH TRIGLYCERIDES RATIO   VLDL UNABLE TO CALCULATE IF TRIGLYCERIDE OVER 400 mg/dL 0 - 40 mg/dL   LDL Cholesterol UNABLE TO CALCULATE IF TRIGLYCERIDE OVER 400 mg/dL 0 - 99 mg/dL    Comment:  Performed at [place] [address], [place], [place] [address]  Protime-INR     Status: None   Collection Time: 01/06/19  8:58 PM  Result Value Ref Range   Prothrombin Time 13.9 11.4 - 15.2 seconds   INR 1.1 0.8 - 1.2    Comment: (NOTE) INR goal varies based on device and disease states. Performed at [facility], [place] [address], [place], [place] [address]   APTT     Status: None   Collection Time: 01/06/19  8:58 PM  Result Value Ref Range   aPTT 33 24 - 36 seconds    Comment: Performed at [facility] [address] [place], [place] [address]  CK     Status: None   Collection Time: 01/06/19 10:34 PM  Result Value Ref Range   Total CK 248 49 - 397 U/L    Comment: Performed at [facility], [place] [address] [place], [place] [address]  Lactate dehydrogenase     Status: Abnormal   Collection Time: 01/06/19 10:34 PM  Result Value Ref Range   LDH >10,000 (H) 98 - 192 U/L  Comment: RESULTS CONFIRMED BY MANUAL DILUTION Performed at [facility], [place] [address], [place], [place] [address]   Troponin I - Now Then Q6H     Status: None   Collection Time: 01/07/19  1:14 AM  Result Value Ref Range   Troponin I <0.03 <0.03 ng/mL    Comment: Performed at [facility] [address] [place], [place] [address]  Magnesium     Status: None   Collection Time: 01/07/19  1:14 AM  Result Value Ref Range   Magnesium 2.3 1.7 - 2.4 mg/dL    Comment: Performed at [facility] [address] [place], [place] [address]  Lactic acid, plasma     Status: Abnormal   Collection Time: 01/07/19  1:14 AM  Result Value Ref Range   Lactic Acid, Venous 4.3 (HH) 0.5 - 1.9 mmol/L    Comment: CRITICAL RESULT CALLED TO, READ BACK BY AND VERIFIED WITH: BERNARDO M,RN 01/07/19 0159 WAYK Performed at [facility], [place] [address], [place], [place] [address]   Phosphorus     Status: Abnormal   Collection Time: 01/07/19  1:14 AM  Result Value Ref Range    Phosphorus 2.0 (L) 2.5 - 4.6 mg/dL    Comment: Performed at [place] [facility] [address] [place], [place] [address]  Troponin I - Now Then Q6H     Status: Abnormal   Collection Time: 01/07/19  5:55 AM  Result Value Ref Range   Troponin I 0.03 (HH) <0.03 ng/mL    Comment: CRITICAL RESULT CALLED TO, READ BACK BY AND VERIFIED WITH: Elray Mcgregor RN AT 6468 01/07/2019 BY Karie Chimera Performed at [facility], [place] [address] [place], [place] [address]   Basic metabolic panel     Status: Abnormal   Collection Time: 01/07/19  5:55 AM  Result Value Ref Range   Sodium 139 135 - 145 mmol/L   Potassium 3.4 (L) 3.5 - 5.1 mmol/L   Chloride 105 98 - 111 mmol/L   CO2 19 (L) 22 - 32 mmol/L   Glucose, Bld 75 70 - 99 mg/dL   BUN 16 8 - 23 mg/dL   Creatinine, Ser 2.43 (H) 0.61 - 1.24 mg/dL    Comment: DELTA CHECK NOTED   Calcium 7.9 (L) 8.9 - 10.3 mg/dL   GFR calc non Af Amer 28 (L) >60 mL/min   GFR calc Af Amer 32 (L) >60 mL/min   Anion gap 15 5 - 15    Comment: Performed at [facility] [address] [place], [place] [address]  Hepatic function panel     Status: Abnormal   Collection Time: 01/07/19  5:55 AM  Result Value Ref Range   Total Protein 6.2 (L) 6.5 - 8.1 g/dL   Albumin 3.3 (L) 3.5 - 5.0 g/dL   AST 8,290 (H) 15 - 41 U/L    Comment: RESULTS CONFIRMED BY MANUAL DILUTION   ALT 2,784 (H) 0 - 44 U/L    Comment: RESULTS CONFIRMED BY MANUAL DILUTION   Alkaline Phosphatase 129 (H) 38 - 126 U/L   Total Bilirubin 2.4 (H) 0.3 - 1.2 mg/dL   Bilirubin, Direct 1.1 (H) 0.0 - 0.2 mg/dL   Indirect Bilirubin 1.3 (H) 0.3 - 0.9 mg/dL    Comment: Performed at [facility] [address], [place],  [address]  Sedimentation rate     Status: None   Collection Time: 01/07/19  5:55 AM  Result Value Ref Range   Sed Rate 1 0 - 16 mm/hr    Comment: Performed at [facility], [place] [address] [place], [place] [address]  C-reactive protein     Status: None    Collection Time: 01/07/19  5:55 AM  Result Value Ref Range   CRP <0.8 <1.0 mg/dL    Comment: Performed at [facility], [place] [address], [place], [place] [address]  Lactic acid, plasma     Status: Abnormal   Collection Time: 01/07/19  5:55 AM  Result Value Ref Range   Lactic Acid, Venous 2.2 (HH) 0.5 - 1.9 mmol/L    Comment: CRITICAL RESULT CALLED TO, READ BACK BY AND VERIFIED WITH: Manuela Schwartz RN AT 0867 01/07/2019 BY Karie Chimera Performed at [facility], [address] [place], [place] [address]   Ammonia     Status: None   Collection Time: 01/07/19  5:55 AM  Result Value Ref Range   Ammonia RESULTS UNAVAILABLE DUE TO INTERFERING SUBSTANCE 9 - 35 umol/L    Comment: CALLED TO Elray Mcgregor RN AT [facility] Performed at [facility], [address], [place], [place] [address]   Acetaminophen level     Status: Abnormal   Collection Time: 01/07/19  5:55 AM  Result Value Ref Range   Acetaminophen (Tylenol), Serum <10 (L) 10 - 30 ug/mL    Comment: RESULTS CONFIRMED BY MANUAL DILUTION (NOTE) Therapeutic concentrations vary significantly. A range of 10-30 ug/mL  may be an effective concentration for many patients. However, some  are best treated at concentrations outside of this range. Acetaminophen concentrations >150 ug/mL at 4 hours after ingestion  and >50 ug/mL at 12 hours after ingestion are often associated with  toxic reactions. Performed at [facility], [place] [address] [place], [place] [address]   Glucose, capillary     Status: None   Collection Time: 01/07/19  6:18 AM  Result Value Ref Range   Glucose-Capillary 72 70 - 99 mg/dL  Gamma GT     Status: Abnormal   Collection Time: 01/07/19  8:42 AM  Result Value Ref Range   GGT 1,414 (H) 7 - 50 U/L    Comment: Performed at [facility], [place] [address] [place], [place] [address]  CBC with Differential/Platelet     Status: Abnormal   Collection Time: 01/07/19  9:45 AM  Result  Value Ref Range   WBC 7.5 4.0 - 10.5 K/uL   RBC 5.78 4.22 - 5.81 MIL/uL   Hemoglobin 12.0 (L) 13.0 - 17.0 g/dL   HCT 36.8 (L) 39.0 - 52.0 %   MCV 63.7 (L) 80.0 - 100.0 fL   MCH 20.8 (L) 26.0 - 34.0 pg   MCHC 32.6 30.0 - 36.0 g/dL   RDW 17.5 (H) 11.5 - 15.5 %   Platelets 181 150 - 400 K/uL   nRBC 0.3 (H) 0.0 - 0.2 %   Neutrophils Relative % 77 %   Neutro Abs 5.8 1.7 - 7.7 K/uL   Lymphocytes Relative 19 %   Lymphs Abs 1.4 0.7 - 4.0 K/uL   Monocytes Relative 3 %   Monocytes Absolute 0.2 0.1 - 1.0 K/uL   Eosinophils Relative 0 %   Eosinophils Absolute 0.0 0.0 - 0.5 K/uL   Basophils Relative 1 %   Basophils Absolute 0.0 0.0 - 0.1 K/uL   Immature Granulocytes 0 %   Abs Immature Granulocytes 0.03 0.00 - 0.07 K/uL  Comment: Performed at [facility], [place] [address] [place], [place] [address]  Urinalysis, Routine w reflex microscopic     Status: Abnormal   Collection Time: 01/07/19 10:54 AM  Result Value Ref Range   Color, Urine AMBER (A) YELLOW    Comment: BIOCHEMICALS MAY BE AFFECTED BY COLOR   APPearance CLOUDY (A) CLEAR   Specific Gravity, Urine 1.031 (H) 1.005 - 1.030   pH 7.0 5.0 - 8.0   Glucose, UA 50 (A) NEGATIVE mg/dL   Hgb urine dipstick MODERATE (A) NEGATIVE   Bilirubin Urine NEGATIVE NEGATIVE   Ketones, ur NEGATIVE NEGATIVE mg/dL   Protein, ur 100 (A) NEGATIVE mg/dL   Nitrite NEGATIVE NEGATIVE   Leukocytes,Ua NEGATIVE NEGATIVE   RBC / HPF 11-20 0 - 5 RBC/hpf   WBC, UA 21-50 0 - 5 WBC/hpf   Bacteria, UA RARE (A) NONE SEEN   Squamous Epithelial / LPF 0-5 0 - 5   Mucus PRESENT     Comment: Performed at [facility], [address] [place], [place] [address]  Urine rapid drug screen (hosp performed)     Status: None   Collection Time: 01/07/19 10:56 AM  Result Value Ref Range   Opiates NONE DETECTED NONE DETECTED   Cocaine NONE DETECTED NONE DETECTED   Benzodiazepines NONE DETECTED NONE DETECTED   Amphetamines NONE DETECTED NONE DETECTED    Tetrahydrocannabinol NONE DETECTED NONE DETECTED   Barbiturates NONE DETECTED NONE DETECTED    Comment: (NOTE) DRUG SCREEN FOR MEDICAL PURPOSES ONLY.  IF CONFIRMATION IS NEEDED FOR ANY PURPOSE, NOTIFY LAB WITHIN 5 DAYS. LOWEST DETECTABLE LIMITS FOR URINE DRUG SCREEN Drug Class                     Cutoff (ng/mL) Amphetamine and metabolites    1000 Barbiturate and metabolites    200 Benzodiazepine                 102 Tricyclics and metabolites     300 Opiates and metabolites        300 Cocaine and metabolites        300 THC                            50 Performed at [facility], [place] [address], [place],  [address]     Dg Chest 2 View  Result Date: 01/06/2019 CLINICAL DATA:  Chest pain beginning 1715 hours.  Chest pressure. EXAM: CHEST - 2 VIEW COMPARISON:  08/21/2016 FINDINGS: Artifact overlies the chest. Heart size is normal. Mediastinal shadows are normal. The pulmonary vascularity is normal. The lungs are clear. No effusions. No acute bone finding. Relative chronic elevation of the right hemidiaphragm. IMPRESSION: No active cardiopulmonary disease. Electronically Signed   By: Nelson Chimes M.D.   On: 01/06/2019 19:51   Ct Angio Chest/abd/pel For Dissection W And/or W/wo  Result Date: 01/06/2019 CLINICAL DATA:  Chest pain starting at 5:15 p.m. Chest pressure. Vomiting. Assessment for aortic dissection. EXAM: CT ANGIOGRAPHY CHEST, ABDOMEN AND PELVIS TECHNIQUE: Initial multi detector CT imaging of the chest was performed prior to contrast administration. Subsequently, multi detector CT imaging through the chest, abdomen and pelvis was performed using the standard protocol during bolus administration of intravenous contrast. Multiplanar reconstructed images and MIPs were obtained and reviewed to evaluate the vascular anatomy. CONTRAST:  100 cc Omnipaque 300 COMPARISON:  Multiple exams, including 11/26/2015 CT scan FINDINGS:  Vascular opacification is suboptimal due to presumed  delayed imaging. I reviewed the images and feel that these are adequate to assess for aortic dissection and do not require a repeat dose of contrast and repeat scan of the chest, abdomen, and pelvis, with such a repeat scan also not guaranteed to catch the contrast bolus perfectly. CTA CHEST FINDINGS Cardiovascular: On noncontrast images, there are no findings of acute intramural hematoma. Coronary, aortic arch, and branch vessel atherosclerotic vascular disease. Coronary artery involvement is primarily in the left anterior descending coronary artery. Mild cardiomegaly is present. There is heterogeneity in the pulmonary arterial tree but also in the pulmonary venous tree, and overall the do not feel that this exam is highly reliable in assessing for pulmonary embolus. It was not protocolled to assess for pulmonary embolus. No thoracic aortic or branch vessel dissection or acute vascular finding is noted. Mediastinum/Nodes: Subtle nonspecific stranding in the left lower neck potentially extending into the superior mediastinum, questionable perivascular distribution. No obvious abscess or pneumomediastinum. No pathologic adenopathy observed. Lungs/Pleura: Mild dependent atelectasis in both lower lobes. Subsegmental atelectasis in the superior segments of both lower lobes. Musculoskeletal: Unremarkable Review of the MIP images confirms the above findings. CTA ABDOMEN AND PELVIS FINDINGS VASCULAR Aorta: Subtle periaortic/retroperitoneal stranding. Atherosclerotic calcification noted. No aneurysm or dissection is visible. No significant aortic stenosis. Celiac: Widely patent celiac tree. SMA: Widely patent. The SMA is thought to provide at least some of the right hepatic arterial supply. Renals: Patent and unremarkable. IMA: Patent. Inflow: Subtle perivascular stranding along the external iliac arteries and left common femoral artery. Atherosclerotic calcification of the common iliac and common femoral arteries without  significant atherosclerotic narrowing. No dissection. Veins: Unremarkable Review of the MIP images confirms the above findings. NON-VASCULAR Hepatobiliary: Unremarkable Pancreas: Unremarkable Spleen: Unremarkable Adrenals/Urinary Tract: 1.7 by 1.3 cm left adrenal adenoma. The kidneys appear unremarkable. Stomach/Bowel: Unremarkable Lymphatic: No pathologic adenopathy is observed. Reproductive: Prostate brachytherapy seed implants noted. Other: No supplemental non-categorized findings. Musculoskeletal: Partial bony bridging at L5-S1 with facet and intervertebral spurring resulting in mild bilateral foraminal impingement. Lesser foraminal narrowing at L3-4 and L4-5. Review of the MIP images confirms the above findings. IMPRESSION: 1. No vascular dissection is identified. 2. There is some unusual perivascular stranding along the abdominal aorta, left common femoral artery, and possibly along the left lower neck extending into the upper mediastinum. Although nonspecific, the possibility of large vessel vasculitis is not totally excluded, and correlation with sedimentation rate or other clinical indicators of possible arteritis is suggested. No aneurysm is observed. 3. The contrast bolus was mildly delayed, suboptimal but adequate for assessing the systemic arteries/aorta. Today's exam is not considered highly sensitive in assessing the pulmonary arteries due to the delayed contrast phase. I did not repeat dose the patient given the mildly elevated creatinine and the additional radiation burden rescanning the entire chest, abdomen, and pelvis would incur. 4. Aortic Atherosclerosis (ICD10-I70.0). Coronary atherosclerosis especially in the left anterior descending coronary artery, with mild cardiomegaly. 5. Lower lumbar bony foraminal narrowing. Electronically Signed   By: Van Clines M.D.   On: 01/06/2019 21:58    ROS negative except above Blood pressure 127/86, pulse 88, temperature 98.2 F (36.8 C),  temperature source Oral, resp. rate 18, height 5\' 5"  (1.651 m), weight 78.8 kg, SpO2 97 %. Physical Exam Vital signs stable afebrile no acute distress patient lying comfortably in the bed abdomen is pertinent for very minimal midepigastric discomfort otherwise soft normal bowel sounds increased transaminases increased creatinine  INR okay acute hepatitis panel pending CT okay Assessment/Plan: Shock liver and probable ATN with acute renal failure Plan: I will increase IV fluids but he may even need more and consider nephrology consult and will check on tomorrow and avoid all liver and kidney toxic medicines in the meantime  Hodari Chuba E 01/07/2019, 11:59 AM

## 2019-01-08 ENCOUNTER — Inpatient Hospital Stay (HOSPITAL_COMMUNITY): Payer: Medicaid Other

## 2019-01-08 LAB — COMPREHENSIVE METABOLIC PANEL
ALT: 1385 U/L — ABNORMAL HIGH (ref 0–44)
AST: 2528 U/L — ABNORMAL HIGH (ref 15–41)
Albumin: 3 g/dL — ABNORMAL LOW (ref 3.5–5.0)
Alkaline Phosphatase: 151 U/L — ABNORMAL HIGH (ref 38–126)
Anion gap: 9 (ref 5–15)
BUN: 22 mg/dL (ref 8–23)
CO2: 17 mmol/L — ABNORMAL LOW (ref 22–32)
Calcium: 7.5 mg/dL — ABNORMAL LOW (ref 8.9–10.3)
Chloride: 108 mmol/L (ref 98–111)
Creatinine, Ser: 4.84 mg/dL — ABNORMAL HIGH (ref 0.61–1.24)
GFR calc Af Amer: 14 mL/min — ABNORMAL LOW (ref >60)
GFR calc non Af Amer: 12 mL/min — ABNORMAL LOW (ref >60)
Glucose, Bld: 88 mg/dL (ref 70–99)
Potassium: 3.3 mmol/L — ABNORMAL LOW (ref 3.5–5.1)
Sodium: 134 mmol/L — ABNORMAL LOW (ref 135–145)
Total Bilirubin: 3.4 mg/dL — ABNORMAL HIGH (ref 0.3–1.2)
Total Protein: 5.4 g/dL — ABNORMAL LOW (ref 6.5–8.1)

## 2019-01-08 LAB — RETICULOCYTES
RBC.: 5.55 MIL/uL (ref 4.22–5.81)
RBC.: 5.57 MIL/uL (ref 4.22–5.81)

## 2019-01-08 LAB — BASIC METABOLIC PANEL
Anion gap: 11 (ref 5–15)
BUN: 26 mg/dL — ABNORMAL HIGH (ref 8–23)
CO2: 17 mmol/L — AB (ref 22–32)
Calcium: 7.6 mg/dL — ABNORMAL LOW (ref 8.9–10.3)
Chloride: 107 mmol/L (ref 98–111)
Creatinine, Ser: 6.55 mg/dL — ABNORMAL HIGH (ref 0.61–1.24)
GFR calc Af Amer: 10 mL/min — ABNORMAL LOW (ref >60)
GFR calc non Af Amer: 8 mL/min — ABNORMAL LOW (ref >60)
Glucose, Bld: 83 mg/dL (ref 70–99)
Potassium: 3.7 mmol/L (ref 3.5–5.1)
Sodium: 135 mmol/L (ref 135–145)

## 2019-01-08 LAB — CBC WITH DIFFERENTIAL/PLATELET
ABS IMMATURE GRANULOCYTES: 0.05 10*3/uL (ref 0.00–0.07)
Basophils Absolute: 0 10*3/uL (ref 0.0–0.1)
Basophils Relative: 1 %
Eosinophils Absolute: 0.1 10*3/uL (ref 0.0–0.5)
Eosinophils Relative: 1 %
HCT: 35.1 % — ABNORMAL LOW (ref 39.0–52.0)
Hemoglobin: 11.3 g/dL — ABNORMAL LOW (ref 13.0–17.0)
IMMATURE GRANULOCYTES: 1 %
Lymphocytes Relative: 13 %
Lymphs Abs: 0.9 10*3/uL (ref 0.7–4.0)
MCH: 20.4 pg — ABNORMAL LOW (ref 26.0–34.0)
MCHC: 32.2 g/dL (ref 30.0–36.0)
MCV: 63.2 fL — ABNORMAL LOW (ref 80.0–100.0)
Monocytes Absolute: 0.5 10*3/uL (ref 0.1–1.0)
Monocytes Relative: 8 %
NEUTROS PCT: 76 %
Neutro Abs: 5.3 10*3/uL (ref 1.7–7.7)
Platelets: 132 10*3/uL — ABNORMAL LOW (ref 150–400)
RBC: 5.55 MIL/uL (ref 4.22–5.81)
RDW: 16.9 % — ABNORMAL HIGH (ref 11.5–15.5)
WBC: 6.9 10*3/uL (ref 4.0–10.5)
nRBC: 0.7 % — ABNORMAL HIGH (ref 0.0–0.2)

## 2019-01-08 LAB — IRON AND TIBC
Iron: 279 ug/dL — ABNORMAL HIGH (ref 45–182)
Saturation Ratios: 138 % — ABNORMAL HIGH (ref 17.9–39.5)
TIBC: 202 ug/dL — AB (ref 250–450)

## 2019-01-08 LAB — MAGNESIUM: Magnesium: 1.7 mg/dL (ref 1.7–2.4)

## 2019-01-08 LAB — FERRITIN: Ferritin: >7500 ng/mL — ABNORMAL HIGH (ref 24–336)

## 2019-01-08 LAB — FOLATE: Folate: 26.4 ng/mL (ref >5.9)

## 2019-01-08 LAB — PHOSPHORUS: Phosphorus: 3.2 mg/dL (ref 2.5–4.6)

## 2019-01-08 LAB — VITAMIN B12: Vitamin B-12: 760 pg/mL (ref 180–914)

## 2019-01-08 MED ORDER — POTASSIUM CHLORIDE CRYS ER 20 MEQ PO TBCR
40.0000 meq | EXTENDED_RELEASE_TABLET | Freq: Once | ORAL | Status: AC
  Administered 2019-01-08: 40 meq via ORAL
  Filled 2019-01-08: qty 2

## 2019-01-08 MED ORDER — PROMETHAZINE HCL 25 MG/ML IJ SOLN
12.5000 mg | Freq: Four times a day (QID) | INTRAMUSCULAR | Status: DC | PRN
  Administered 2019-01-08: 12.5 mg via INTRAVENOUS
  Filled 2019-01-08: qty 1

## 2019-01-08 MED ORDER — LIDOCAINE HCL URETHRAL/MUCOSAL 2 % EX GEL
1.0000 "application " | Freq: Once | CUTANEOUS | Status: DC
  Filled 2019-01-08: qty 5

## 2019-01-08 MED ORDER — SODIUM CHLORIDE 0.9 % IV SOLN
INTRAVENOUS | Status: DC
  Administered 2019-01-08 (×2): via INTRAVENOUS

## 2019-01-08 NOTE — Progress Notes (Addendum)
Noticed that patient was voiding small amounts of urine today in the morning.  Informed patient that he need to urinate In order to collect urine sample. Checked on patient several times but he was unable to void stating that he " feels he needs to pee". Patient was drinking water and hydrating during the day. Performed bladder scan with no urine in bladder. Paged MD Raiford Noble in notified him. Order for I and O cath was placed per MD. Not able to preform I and O (Two RNs present) due to catheter "coiling" and due to meeting resistance.Pateint stated he had similar issues in the past. MD notified and coude catheter ordered to be placed. Nephrology notified as well, no new orders per MD [employer].  Supplies ordered. Awaiting on delivery

## 2019-01-08 NOTE — Progress Notes (Signed)
Bradley Ray 11:21 AM  Subjective: Patient actually feeling better and case discussed with the hospital team he has no new complaints and is tolerating his diet but is not urinating very much and supposedly the renal team has been consulted  Objective: Vital signs stable afebrile no acute distress abdomen is soft nontender CBC okay transaminases decreasing as bilirubin increase which is not surprising for shock liver ferritin probably acute phase reactant liver and kidney ultrasound is nonrevealing as expected with nondiagnostic CT scan  Assessment: Shock liver and kidneys  Plan: We will recheck coags tomorrow expect both bili and INR to rise as transaminases decrease and will ask GI hospital team to check on tomorrow  [facility]  Pager [phone] After 5PM or if no answer call [phone]

## 2019-01-08 NOTE — Progress Notes (Signed)
Supplies ordered, still awaiting on delivery for catheter.

## 2019-01-08 NOTE — Progress Notes (Signed)
Patient vomited several times in the afternoon, Zofran not effective, paged MD and Phenergan given and effective.

## 2019-01-08 NOTE — Progress Notes (Signed)
PROGRESS NOTE    Bradley Ray  [name] DOB: Jul 16, 1956 DOA: 01/06/2019 PCP: Nolene Ebbs, MD   Brief Narrative:  HPI per Dr. Shela Leff on 01/06/2019 Bradley Ray is a 63 y.o. male with medical history significant of polysubstance use, hypertension, hyperlipidemia, and conditions listed below presenting to the hospital for evaluation of chest pain.  Patient states yesterday around 4 PM he was sitting and watching TV and experienced acute onset pressure across his chest and upper abdomen.  Denies any shortness of breath or nausea at that time.  States this lasted about 45 minutes and when EMS gave him nitroglycerin he vomited.  Denies any history of CAD or prior MI.  States he quit smoking 8 years ago.  States his maternal aunt had CAD.  Denies any fevers or recent sick contacts.  States he was not having any nausea, vomiting, or abdominal pain at home but does report feeling tired lately.  States he drinks 2-3 shots of whiskey every day but yesterday drank more than usual because it was his friend's birthday.  He had a pint of liquor yesterday.  States he smokes marijuana occasionally.  Denies any other drug use. Patient denies any acetaminophen use.  Denies use of any other over-the-counter medications or supplements.  Denies any recent antibiotic use.  **LFTs wereworsening so will obtained right upper quadrant ultrasound and continue IV fluid hydration. Renal Function is also affected and worsened likely in the setting of contrast for CTA and significantly worsened today and patient having decreased Urine Output so Nephrology Consulted.   Assessment & Plan:   Principal Problem:   Shock liver Active Problems:   Hypertriglyceridemia   Chest pain   SIRS (systemic inflammatory response syndrome) (HCC)   AKI (acute kidney injury) ([facility])  Elevated LFTs in the Setting of shock liver, some what improved -On Admission AST 5520 and ALT 2204. T bili 2.2.  Coags normal. -Repeat  yesterday AM showed AST of 8,290, ALT of 2,784, T Bili of 2.4 (Direct of 1.3 and Indirect of 1.1), GGT of 1,414, and Alk Phos of 129   -Repeat this AM showed AST of 2,528, ALT of 1,385, T Bili  Worsened and is now 3.4, Alk Phos of 151   -Platelet count normal at 224 and dropped to 132.   -No encephalopathy.  Salicylate level negative.   -Ethanol was level mildly elevated at 22.  CK normal.     -Lactic acid was 4.3 and trended down to 2.2.   -Phosphorus level 2.0 and replete and repeat this AM was 3.2.   -Extremely high LDH (greater than 10,000).  -Patient denies any Acetaminophen use.   -Denies use of any other over-the-counter medications or supplements.  Denies any recent antibiotic use.   -States he smokes marijuana occasionally but denies any other drug use. -UDS Negative -Acute Hepatitis Panel Pending; Has a Hx of Hepatitis and does have Alcoholism -Acetaminophen Level done and <10 and Ammonia Level was not Done -Continue IV fluid hydration with normal saline but rate was increased by Gastroenterology to 150 mL's per hour -Will initiate Workup for Autoimmune Hepatitis and SMA, ANA, and LKM-1 Ab sent -Checking Ceruloplasmin and Anemia Panel with Ferritin -Anemia panel showed an iron level of 279, U IBC was not calculated, TIBC was 2 2, saturation ratio is 138%, ferritin level was greater than 7500, folate level was 26.4 and vitamin B12 level was 760 -CT Scan of Chest/Abd/Pelvis showed Unremarkable Hepatobiliary Region and Pancreas -Gastroenterology consulted for further  evaluation and recommendations and recommending Increasing IVF and considering Nephrology consultation -Avoid all Hepatotoxic and Nephrotoxic Medications  -Checked RUQ U/S showed No explanation for elevated LFTs. No gallstones. Tiny gallbladder polyp. Given size less than 6 mm, no further evaluation or follow-up recommended. -Will Continue to Monitor and Trend Liver Fxn -Gastroenterology recommends rechecking coagulation  factors tomorrow and they expect both bilirubin and INR to rise his transaminases decrease and will continue monitoring  Severe Hypertriglyceridemia/HLD -Lipid panel showing triglycerides 4875, cholesterol 333, and HDL 18. ?whether related to shock liver. Unable to Calculate LDL -Encouraged cessation of alcohol use.   -Low-fat diet.  -Avoid starting a statin or fibrate at this time in the setting of acute liver failure. -Takes Atorvastatin 40 mg po qHS as an outpatient and currently being held due to Liver Fxn  Chest Pain, improved -Does have significant risk factors for CAD including age, gender, hypertension, hyperlipidemia, prior tobacco use, and family history.   -Troponin x 2 negative and repeats flat at 0.03 x2.   EKG not suggestive of ACS.  Currently not complaining of chest pain.   -Chest x-ray showing no active cardiopulmonary disease.   -CT angiogram negative for dissection and inconclusive for PE.  -Not hypoxic.   -Lipid panel showing low HDL and severe hypertriglyceridemia.   -Echo done in February 2017 with possible hypokinesis of the mid inferoseptal and apical septal myocardium.  -Received aspirin 324 mg and 0.4 mg nitro by EMS. -Cardiac monitoring.   -C/w Sublingual nitroglycerin as needed.   -Repeating Echocardiogram this visit -May discuss with Cardiology after GI Issues are resolved   SIRS -Afebrile.  No leukocytosis and WBC went from 5.5 -> 7.5 -> 6.9. -WasTachycardic and tachypneic.  Not hypotensive.   -Lactic acid elevated at 4.3 in the setting of shock liver and improved to 2.2.   -Chest x-ray not suggestive of pneumonia. -C/w IVF as below -Continue to trend lactate and trended down.   -Check UA. -Continue to monitor hemodynamics.  AKI -On Admission Creatinine 1.10, baseline 0.8. -BUN/Cr worsened yesterday 16/2.43 and in setting of Contrast Exposure and now further worsened to 22/4.84 -Avoid nephrotoxic agents and contrast agentes -C/w IVF Hydration  with NS at 150 mL/hr -Continue to Monitor and Trend Renal Fxn -Checked Renal U/S and showed No hydronephrosis. Increased renal echogenicity, suggesting medical renal disease. Perinephric fluid is identified bilaterally. This is nonspecific but likely related to renal insufficiency. -Nephrology consulted for further evaluation and recommendations -Repeat CMP in AM   Hypokalemia -Potassium 3.2 on admission and is now 3.3.   -Magnesium is now 1.7. -Replete with po Potassium Chloride 40 mEQ again -Continue to Monitor and Replete as Necessary -Repeat CMP in AM  Hypophosphatemia -Patient's Phos Level was 2.0 and is now 3.2 -Continue to Montior and Replete as Necessary -Repeat Phos Level in AM    High anion gap metabolic acidosis, improved  -Suspect secondary to lactic acidosis from shock liver.  -Salicylate level negative.   -On Admission Bicarb 17, anion gap 25.  Blood glucose 103. -Now AG is 9 but CO2 is 81 -C/w IVF Hydration as above  Concern for Large vessel vasculitis -CT showing some unusual perivascular stranding along the abdominal aorta, left common femoral artery, and possibly along the left lower neck extending into the upper mediastinum; suspicion for large vessel vasculitis. -Checked ESR and CRP levels and CRP was <0.8 and ESR was 1 -Continue to Monitor and will consider further workup  Alcohol Dependence/Alcoholism -Drinks at least a pint a day -  C/w CIWA monitoring per PRotocol; Ativan PRN -C/w Thiamine, folate, multivitamin.  Obesity -Estimated body mass index is 30.24 kg/m as calculated from the following:   Height as of this encounter: _0  (1.651 m).   Weight as of this encounter: 82.4 kg. -Weight Loss and Dietary Counseling given  DVT prophylaxis: SCDs; Enoxaparin 40 mg sq q24h Code Status: FULL CODE Family Communication: No family present at bedside  Disposition Plan: Remain Inpatient for continued workup and treatment and evaluation of Abnormal LFT's,  Abdominal Pain   Consultants:   Gastroenterology   Nephrology   Procedures:  ECHOCARDIOGRAM IMPRESSIONS    1. The left ventricle has normal systolic function, with an ejection fraction of 55-60%. The cavity size was mildly dilated. Left ventricular diastolic Doppler parameters are consistent with impaired relaxation.  2. The right ventricle has normal systolc function. The cavity was normal. There is no increase in right ventricular wall thickness.  3. No evidence present in the left atrial appendage.  4. There is mild to moderate mitral annular calcification present.  5. The aortic valve is tricuspid Mild sclerosis of the aortic valve. Aortic valve regurgitation was not assessed by color flow Doppler.  6. No pulmonic valve vegetation visualized.  7. The interatrial septum appears to be lipomatous.  FINDINGS  Left Ventricle: The left ventricle has normal systolic function, with an ejection fraction of 55-60%. The cavity size was mildly dilated. There is no increase in left ventricular wall thickness. Left ventricular diastolic Doppler parameters are  consistent with impaired relaxation (grade I). Normal left ventricular filling pressures.   Right Ventricle: The right ventricle has normal systolic function. The cavity was normal. There is no increase in right ventricular wall thickness. Left Atrium: Left atrial size was normal in size. Left Atrial Appendage: No evidence of a thrombus present in the left atrial appendage. Right Atrium: Right atrial size was normal in size. Right atrial pressure is estimated at 3 mmHg. Interatrial Septum: No atrial level shunt detected by color flow Doppler. Increased thickness of the atrial septum sparing the fossa ovalis consistent with The interatrial septum appears to be lipomatous. Pericardium: There is no evidence of pericardial effusion. Mitral Valve: The mitral valve is normal in structure. There is mild to moderate mitral annular calcification  present. Mitral valve regurgitation is trivial by color flow Doppler. Tricuspid Valve: The tricuspid valve was normal in structure. Tricuspid valve regurgitation is mild by color flow Doppler. Aortic Valve: The aortic valve is tricuspid Mild sclerosis of the aortic valve. Aortic valve regurgitation was not assessed by color flow Doppler. Pulmonic Valve: The pulmonic valve was normal in structure. Pulmonic valve regurgitation is trivial by color flow Doppler. No pulmonic valve vegetation visualized. Venous: The inferior vena cava is normal in size with greater than 50% respiratory variability.   LEFT VENTRICLE PLAX 2D LVIDd:         5.50 cm  Diastology LVIDs:         3.80 cm  LV e' lateral:   7.07 cm/s LV PW:         0.90 cm  LV E/e' lateral: 8.2 LV IVS:        0.90 cm  LV e' medial:    7.72 cm/s LVOT diam:     2.30 cm  LV E/e' medial:  7.6 LV SV:         85 ml LV SV Index:   44.43 LVOT Area:     4.15 cm  LEFT ATRIUM  Index      RIGHT ATRIUM LA diam:    4.30 cm 2.31 cm/m RA Pressure: 3 mmHg  AORTIC VALVE LVOT Vmax:   92.70 cm/s LVOT Vmean:  63.400 cm/s LVOT VTI:    0.179 m   AORTA Ao Root diam: 3.70 cm  MV E velocity: 58.30 cm/s MV A velocity: 102.00 cm/s SHUNTS MV E/A ratio:  0.57        Systemic VTI:  0.18 m                            Systemic Diam: 2.30 cm   Antimicrobials:  Anti-infectives (From admission, onward)   None     Subjective: Seen and examined at bedside and they said his abdominal pain is improved.  Denies any nausea or vomiting but states that he is not urinating as much and has a decreased urine output.  Wanting to sleep.  No other concerns or complaints at this time.  Objective: Vitals:   01/07/19 1929 01/07/19 2347 01/08/19 0313 01/08/19 0835  BP: 140/85 (!) 143/87 140/86 136/86  Pulse: 94 90 92 92  Resp: _0 Temp: 98.8 F (37.1 C) 99.6 F (37.6 C) 99.3 F (37.4 C)   TempSrc: Oral Oral Oral   SpO2: 98% 91% 95% 100%  Weight:    82.4 kg   Height:        Intake/Output Summary (Last 24 hours) at 01/08/2019 1115 Last data filed at 01/08/2019 1000 Gross per 24 hour  Intake 3697.04 ml  Output 425 ml  Net 3272.04 ml   Filed Weights   01/07/19 0225 01/07/19 0638 01/08/19 0313  Weight: 78.8 kg 78.8 kg 82.4 kg   Examination: Physical Exam:  Constitutional: Developed mildly obese African-American male currently acute distress appears calm and more comfortable today laying in bed Eyes: Lids and Conjunctive are normal.  Sclera anicteric ENMT: External ears and nose appear normal.  Grossly normal hearing.  Mucous members are moist Neck: Appears supple with no JVD Respiratory: Diminished To auscultation bilaterally no appreciable wheezing, rales, rhonchi.  Patient not tachypneic or using accessory muscles to breathe Cardiovascular: The rate and rhythm.  No appreciable murmurs, rubs, gallops.  No lower extremity edema noted Abdomen:, Nontender to palpate today.  Mildly distended secondary to body habitus.  Bowel sounds present in 4 quadrants GU: Deferred Musculoskeletal: No contractures or cyanosis.  No joint deformities in the upper lower extremities Skin: Is warm and dry no appreciable rashes or lesions on limited skin evaluation Neurologic: CN nerves II through XII grossly intact no appreciable focal deficits.  Romberg sign cerebellar reflexes were not assessed Psychiatric: Normal judgment and insight.  Patient is awake, alert, oriented x3 but is wanting to sleep.  Has a pleasant mood and affect.  Data Reviewed: I have personally reviewed following labs and imaging studies  CBC: Recent Labs  Lab 01/06/19 1903 01/07/19 0945 01/08/19 0313  WBC 5.5 7.5 6.9  NEUTROABS 3.7 5.8 5.3  HGB 13.1 12.0* 11.3*  HCT 38.5* 36.8* 35.1*  MCV 65.3* 63.7* 63.2*  PLT 224 181 294*   Basic Metabolic Panel: Recent Labs  Lab 01/06/19 1903 01/06/19 1921 01/07/19 0114 01/07/19 0555 01/08/19 0313 01/08/19 0858  NA 143  --    --  139 134*  --   K 3.2*  --   --  3.4* 3.3*  --   CL 101  --   --  105 108  --  CO2 17*  --   --  19* 17*  --   GLUCOSE 103*  --   --  75 88  --   BUN 12  --   --  16 22  --   CREATININE 1.28* 1.10  --  2.43* 4.84*  --   CALCIUM 8.5*  --   --  7.9* 7.5*  --   MG 1.7  --  2.3  --   --  1.7  PHOS  --   --  2.0*  --   --  3.2   GFR: Estimated Creatinine Clearance: 15.8 mL/min (A) (by C-G formula based on SCr of 4.84 mg/dL (H)). Liver Function Tests: Recent Labs  Lab 01/06/19 1903 01/07/19 0555 01/08/19 0313  AST 5,520* 8,290* 2,528*  ALT 2,204* 2,784* 1,385*  ALKPHOS 108 129* 151*  BILITOT 2.2* 2.4* 3.4*  PROT 6.6 6.2* 5.4*  ALBUMIN 3.7 3.3* 3.0*   Recent Labs  Lab 01/06/19 1903  LIPASE 21   Recent Labs  Lab 01/07/19 0555  AMMONIA RESULTS UNAVAILABLE DUE TO INTERFERING SUBSTANCE   Coagulation Profile: Recent Labs  Lab 01/06/19 2058  INR 1.1   Cardiac Enzymes: Recent Labs  Lab 01/06/19 1903 01/06/19 2234 01/07/19 0114 01/07/19 0555 01/07/19 1240  CKTOTAL  --  248  --   --   --   TROPONINI <0.03  --  <0.03 0.03* 0.03*   BNP (last 3 results) No results for input(s): PROBNP in the last 8760 hours. HbA1C: No results for input(s): HGBA1C in the last 72 hours. CBG: Recent Labs  Lab 01/07/19 0618  GLUCAP 72   Lipid Profile: Recent Labs    01/06/19 2058  CHOL 333*  HDL 18*  LDLCALC UNABLE TO CALCULATE IF TRIGLYCERIDE OVER 400 mg/dL  TRIG 4,875*  CHOLHDL NOT REPORTED DUE TO HIGH TRIGLYCERIDES   Thyroid Function Tests: No results for input(s): TSH, T4TOTAL, FREET4, T3FREE, THYROIDAB in the last 72 hours. Anemia Panel: Recent Labs    01/08/19 0313  VITAMINB12 760  FOLATE 26.4  FERRITIN >7,500*  TIBC 202*  IRON 279*  RETICCTPCT QUESTIONABLE RESULTS, RECOMMEND RECOLLECT TO VERIFY  QUESTIONABLE RESULTS, RECOMMEND RECOLLECT TO VERIFY   Sepsis Labs: Recent Labs  Lab 01/07/19 0114 01/07/19 0555  LATICACIDVEN 4.3* 2.2*    No results found  for this or any previous visit (from the past 240 hour(s)).   Radiology Studies: Dg Chest 2 View  Result Date: 01/06/2019 CLINICAL DATA:  Chest pain beginning 1715 hours.  Chest pressure. EXAM: CHEST - 2 VIEW COMPARISON:  08/21/2016 FINDINGS: Artifact overlies the chest. Heart size is normal. Mediastinal shadows are normal. The pulmonary vascularity is normal. The lungs are clear. No effusions. No acute bone finding. Relative chronic elevation of the right hemidiaphragm. IMPRESSION: No active cardiopulmonary disease. Electronically Signed   By: Nelson Chimes M.D.   On: 01/06/2019 19:51   US Renal  Result Date: 01/08/2019 CLINICAL DATA:  Acute renal insufficiency. EXAM: RENAL / URINARY TRACT ULTRASOUND COMPLETE COMPARISON:  Right upper quadrant ultrasound 01/07/2019. Abdominopelvic CT 11/26/2015. FINDINGS: Right Kidney: Renal measurements: 13.1 x 6.6 x 6.4 cm = volume: 289 mL. Mildly increased echogenicity. No hydronephrosis. Trace perinephric fluid. Left Kidney: Renal measurements: 12.9 x 6.6 x 7.3 cm = volume: 326 mL. Mildly increased echogenicity. No hydronephrosis. Trace perinephric fluid. Bladder: Decompressed. IMPRESSION: 1.  No hydronephrosis. 2. Increased renal echogenicity, suggesting medical renal disease. 3. Perinephric fluid is identified bilaterally. This is nonspecific but likely related to renal insufficiency. Electronically  Signed   By: Abigail Miyamoto M.D.   On: 01/08/2019 10:18   Ct Angio Chest/abd/pel For Dissection W And/or W/wo  Result Date: 01/06/2019 CLINICAL DATA:  Chest pain starting at 5:15 p.m. Chest pressure. Vomiting. Assessment for aortic dissection. EXAM: CT ANGIOGRAPHY CHEST, ABDOMEN AND PELVIS TECHNIQUE: Initial multi detector CT imaging of the chest was performed prior to contrast administration. Subsequently, multi detector CT imaging through the chest, abdomen and pelvis was performed using the standard protocol during bolus administration of intravenous contrast.  Multiplanar reconstructed images and MIPs were obtained and reviewed to evaluate the vascular anatomy. CONTRAST:  100 cc Omnipaque 300 COMPARISON:  Multiple exams, including 11/26/2015 CT scan FINDINGS: Vascular opacification is suboptimal due to presumed delayed imaging. I reviewed the images and feel that these are adequate to assess for aortic dissection and do not require a repeat dose of contrast and repeat scan of the chest, abdomen, and pelvis, with such a repeat scan also not guaranteed to catch the contrast bolus perfectly. CTA CHEST FINDINGS Cardiovascular: On noncontrast images, there are no findings of acute intramural hematoma. Coronary, aortic arch, and branch vessel atherosclerotic vascular disease. Coronary artery involvement is primarily in the left anterior descending coronary artery. Mild cardiomegaly is present. There is heterogeneity in the pulmonary arterial tree but also in the pulmonary venous tree, and overall the do not feel that this exam is highly reliable in assessing for pulmonary embolus. It was not protocolled to assess for pulmonary embolus. No thoracic aortic or branch vessel dissection or acute vascular finding is noted. Mediastinum/Nodes: Subtle nonspecific stranding in the left lower neck potentially extending into the superior mediastinum, questionable perivascular distribution. No obvious abscess or pneumomediastinum. No pathologic adenopathy observed. Lungs/Pleura: Mild dependent atelectasis in both lower lobes. Subsegmental atelectasis in the superior segments of both lower lobes. Musculoskeletal: Unremarkable Review of the MIP images confirms the above findings. CTA ABDOMEN AND PELVIS FINDINGS VASCULAR Aorta: Subtle periaortic/retroperitoneal stranding. Atherosclerotic calcification noted. No aneurysm or dissection is visible. No significant aortic stenosis. Celiac: Widely patent celiac tree. SMA: Widely patent. The SMA is thought to provide at least some of the right  hepatic arterial supply. Renals: Patent and unremarkable. IMA: Patent. Inflow: Subtle perivascular stranding along the external iliac arteries and left common femoral artery. Atherosclerotic calcification of the common iliac and common femoral arteries without significant atherosclerotic narrowing. No dissection. Veins: Unremarkable Review of the MIP images confirms the above findings. NON-VASCULAR Hepatobiliary: Unremarkable Pancreas: Unremarkable Spleen: Unremarkable Adrenals/Urinary Tract: 1.7 by 1.3 cm left adrenal adenoma. The kidneys appear unremarkable. Stomach/Bowel: Unremarkable Lymphatic: No pathologic adenopathy is observed. Reproductive: Prostate brachytherapy seed implants noted. Other: No supplemental non-categorized findings. Musculoskeletal: Partial bony bridging at L5-S1 with facet and intervertebral spurring resulting in mild bilateral foraminal impingement. Lesser foraminal narrowing at L3-4 and L4-5. Review of the MIP images confirms the above findings. IMPRESSION: 1. No vascular dissection is identified. 2. There is some unusual perivascular stranding along the abdominal aorta, left common femoral artery, and possibly along the left lower neck extending into the upper mediastinum. Although nonspecific, the possibility of large vessel vasculitis is not totally excluded, and correlation with sedimentation rate or other clinical indicators of possible arteritis is suggested. No aneurysm is observed. 3. The contrast bolus was mildly delayed, suboptimal but adequate for assessing the systemic arteries/aorta. Today's exam is not considered highly sensitive in assessing the pulmonary arteries due to the delayed contrast phase. I did not repeat dose the patient given the mildly elevated creatinine and  the additional radiation burden rescanning the entire chest, abdomen, and pelvis would incur. 4. Aortic Atherosclerosis (ICD10-I70.0). Coronary atherosclerosis especially in the left anterior descending  coronary artery, with mild cardiomegaly. 5. Lower lumbar bony foraminal narrowing. Electronically Signed   By: Van Clines M.D.   On: 01/06/2019 21:58   US Abdomen Limited Ruq  Result Date: 01/07/2019 CLINICAL DATA:  Abnormal LFTs. EXAM: ULTRASOUND ABDOMEN LIMITED RIGHT UPPER QUADRANT COMPARISON:  CT yesterday. FINDINGS: Gallbladder: Physiologically distended. No gallstones or wall thickening visualized. 3 mm nonshadowing polyp in the mid gallbladder. No sonographic Murphy sign noted by sonographer. Common bile duct: Diameter: 2-3 mm, normal. Liver: No focal lesion identified. Within normal limits in parenchymal echogenicity. Portal vein is patent on color Doppler imaging with normal direction of blood flow towards the liver. Incidental note of increased echogenicity of the right kidney with trace perinephric fluid. IMPRESSION: 1. No explanation for elevated LFTs. 2. No gallstones. Tiny gallbladder polyp. Given size less than 6 mm, no further evaluation or follow-up recommended. Electronically Signed   By: Keith Rake M.D.   On: 01/07/2019 19:29   Scheduled Meds: . folic acid  1 mg Oral Daily  . multivitamin with minerals  1 tablet Oral Daily  . thiamine  100 mg Oral Daily   Or  . thiamine  100 mg Intravenous Daily   Continuous Infusions:   LOS: 1 day   Kerney Elbe, DO [facility] PAGER is on [place]  If 7PM-7AM, please contact night-coverage [email or web address] Password [email or web address] 01/08/2019, 11:15 AM

## 2019-01-08 NOTE — Consult Note (Signed)
[facility]    NEPHROLOGY CONSULTATION NOTE  PATIENT ID:  Almyra Deforest, DOB:  June 03, 1956  HPI: The patient is a 63 y.o. year old male with a past medical history significant for polysubstance abuse, hypertension, and hyperlipidemia who presented to the hospital for evaluation of chest pain.  He was apparently sitting and watching TV and experienced acute onset pressure across his chest and upper abdomen.  It lasted approximately 45 minutes.  He reported significant back pain as well.  He was brought to the emergency department for further evaluation.  He was apparently drinking 2-3 shots of whiskey every day but drank more than usual on the day prior to admission.  A CT angiogram was done in the emergency department to rule out dissection.  His baseline creatinine from 06/08/2018 was 0.6.  On admission, his creatinine was 1.28.  It has worsened to 4.84 today.  Renal consultation has been called for acute kidney injury.  He reports today feeling generally well, and outside of decreased urine output, he has no specific complaints.  He had some abdominal pain yesterday, which has resolved.   Past Medical History:  Diagnosis Date  . Alcoholism ([place])    per pt as of 10-08-2016 as cut down drinking to 2-3 cans per day --- per epic documentation long hx dependence  . Arthritis    knees  . Beta thalassemia trait    per hematologist/ oncologist note- dr Dorita Sciara--  per blood work-up  dx 02/ 2017  . BPH with obstruction/lower urinary tract symptoms   . Cocaine use    last used as of 10-08-2016 pt stated "had a little powder before christmas"  . ED (erectile dysfunction)   . Hepatitis    states was 30 yrs ago from "food"  . Hyperlipidemia   . Hypertension   . Prostate cancer [facility]) urologist-  dr wrenn/  oncologist-  dr Tammi Klippel   dx 07-02-2016,  T1c,  Gleason 3+3,  PSA 6.48,  48.44cc  . 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 Wears dentures    upper    Past Surgical History:  Procedure Laterality Date  . CYSTOSCOPY N/A 10/15/2016   Procedure: CYSTOSCOPY FLEXIBLE;  Surgeon: [name], MD;  Location: [facility];  Service: Urology;  Laterality: N/A;  NO SEEDS FOUND IN BLADDER  . ORIF LEFT ANKLE  1980's  . PROSTATE BIOPSY    . RADIOACTIVE SEED IMPLANT N/A 10/15/2016   Procedure: RADIOACTIVE SEED IMPLANT/BRACHYTHERAPY IMPLANT;  Surgeon: [name], MD;  Location: [facility];  Service: Urology;  Laterality: N/A;     66    SEEDS IMPLANTED  . REPAIR EXTENSOR TENDON Right 06/02/2016   Procedure: RIGHT LONG FINGER EXTENSOR TENDON REPAIR;  Surgeon: Milly Jakob, MD;  Location: [facility];  Service: Orthopedics;  Laterality: Right;  . TOTAL KNEE ARTHROPLASTY Right 2010  approx.  . TRANSTHORACIC ECHOCARDIOGRAM  11/28/2015   mild focal basal hypertrophy of septum, ef 50-55%, possible hypokinesis of mid inferoseptal and apical septal myocardium, grade 1 diastolic dysfunction/  trivial PR  . TRANSURETHRAL RESECTION OF PROSTATE N/A 06/16/2018   Procedure: TRANSURETHRAL RESECTION OF THE PROSTATE (TURP);  Surgeon: [name], MD;  Location: [facility];  Service: Urology;  Laterality: N/A;    Family History  Problem Relation Age of Onset  . Arrhythmia Mother  Has pacer  . Throat cancer Father   . Pancreatic cancer Sister   . Clotting disorder Sister        related knee surgeries  . Cancer Sister        pancreatic  . Sickle cell trait Son   . Cancer Maternal Aunt   . Cancer Maternal Uncle   . Cancer Other     Social History   Tobacco Use  . Smoking status: Former Smoker    Packs/day: 1.00    Years: 30.00    Pack years: 30.00    Types: Cigarettes    Last attempt to quit: 06/01/2006    Years since quitting: 12.6  . Smokeless tobacco: Never Used  Substance Use Topics  . Alcohol use: Yes    Alcohol/week: 16.0 - 23.0 standard drinks     Types: 14 - 21 Cans of beer, 2 Shots of liquor per week    Comment: per pt 10/13/16- 2-3 cans daily, shots 2 per weekend (per epic documentation (feb 2017) admitted to  2 pints spirits, 12 pk beer, and 1-2 40oz drink daily)  . Drug use: Not Currently    Types: "Crack" cocaine, Marijuana    Comment: 10/13/16  states no cocaine since before Christmas,, 2 marijuana weekly    REVIEW OF SYSTEMS: General:  no fatigue, no weakness Head:  no headaches Eyes:  no blurred vision ENT:  no sore throat Neck:  no masses CV:  no chest pain, no orthopnea Lungs:  no shortness of breath, no cough GI:  no nausea or vomiting, no diarrhea.  Positive abdominal pain yesterday GU:  no dysuria or hematuria, decreased urine output Skin:  no rashes or lesions Neuro:  no focal numbness or weakness Psych:  no depression or anxiety    PHYSICAL EXAM:  Vitals:   01/08/19 0313 01/08/19 0835  BP: 140/86 136/86  Pulse: 92 92  Resp: 18 18  Temp: 99.3 F (37.4 C)   SpO2: 95% 100%   I/O last 3 completed shifts: In: 7217.3 [P.O.:720; I.V.:3497.3; IV Piggyback:3000] Out: 425 [Urine:425]   General:  AAOx3 NAD HEENT: MMM  AT anicteric sclera Neck:  No JVD, no adenopathy CV:  Heart RRR  Lungs:  L/S CTA bilaterally Abd:  abd mildly distended, nontender, with normal BS GU:  Bladder non-palpable Extremities: Trace bilateral lower extremity edema Skin:  No skin rash Psych:  normal mood and affect Neuro:  no focal deficits   CURRENT MEDICATIONS:  . folic acid  1 mg Oral Daily  . multivitamin with minerals  1 tablet Oral Daily  . thiamine  100 mg Oral Daily   Or  . thiamine  100 mg Intravenous Daily     HOME MEDICATIONS:  Prior to Admission medications   Medication Sig Start Date End Date Taking? Authorizing Provider  acetaminophen (TYLENOL) 500 MG tablet Take 1,000 mg by mouth daily as needed for moderate pain or headache.    [provider]  amLODipine (NORVASC) 10 MG tablet Take 1 tablet (10  mg total) by mouth daily. Patient taking differently: Take 10 mg by mouth every morning.  11/29/15   Kelvin Cellar, MD  aspirin 81 MG tablet Take 162 mg by mouth daily.     [provider]  atorvastatin (LIPITOR) 40 MG tablet Take 1 tablet (40 mg total) by mouth daily. Patient taking differently: Take 40 mg by mouth every morning.  11/28/15   Kelvin Cellar, MD  hydrochlorothiazide (MICROZIDE) 12.5 MG capsule Take 12.5 mg  by mouth every morning.     [provider]  oxyCODONE-acetaminophen (PERCOCET/ROXICET) 5-325 MG tablet Take 1 tablet by mouth every 6 (six) hours as needed for severe pain. 04/11/18   Glyn Ade, PA-C       LABS:  CBC Latest Ref Rng & Units 01/08/2019 01/07/2019 01/06/2019  WBC 4.0 - 10.5 K/uL 6.9 7.5 5.5  Hemoglobin 13.0 - 17.0 g/dL 11.3(L) 12.0(L) 13.1  Hematocrit 39.0 - 52.0 % 35.1(L) 36.8(L) 38.5(L)  Platelets 150 - 400 K/uL 132(L) 181 224    CMP Latest Ref Rng & Units 01/08/2019 01/07/2019 01/06/2019  Glucose 70 - 99 mg/dL 88 75 -  BUN 8 - 23 mg/dL 22 16 -  Creatinine 0.61 - 1.24 mg/dL 4.84(H) 2.43(H) 1.10  Sodium 135 - 145 mmol/L 134(L) 139 -  Potassium 3.5 - 5.1 mmol/L 3.3(L) 3.4(L) -  Chloride 98 - 111 mmol/L 108 105 -  CO2 22 - 32 mmol/L 17(L) 19(L) -  Calcium 8.9 - 10.3 mg/dL 7.5(L) 7.9(L) -  Total Protein 6.5 - 8.1 g/dL 5.4(L) 6.2(L) -  Total Bilirubin 0.3 - 1.2 mg/dL 3.4(H) 2.4(H) -  Alkaline Phos 38 - 126 U/L 151(H) 129(H) -  AST 15 - 41 U/L 2,528(H) 8,290(H) -  ALT 0 - 44 U/L 1,385(H) 2,784(H) -    Lab Results  Component Value Date   CALCIUM 7.5 (L) 01/08/2019   PHOS 3.2 01/08/2019       Component Value Date/Time   COLORURINE AMBER (A) 01/07/2019 1054   APPEARANCEUR CLOUDY (A) 01/07/2019 1054   LABSPEC 1.031 (H) 01/07/2019 1054   PHURINE 7.0 01/07/2019 1054   GLUCOSEU 50 (A) 01/07/2019 1054   HGBUR MODERATE (A) 01/07/2019 1054   BILIRUBINUR NEGATIVE 01/07/2019 1054   [place] 01/07/2019 1054   PROTEINUR  100 (A) 01/07/2019 1054   NITRITE NEGATIVE 01/07/2019 1054   LEUKOCYTESUR NEGATIVE 01/07/2019 1054   No results found for: PHART, PCO2ART, PO2ART, HCO3, TCO2, ACIDBASEDEF, O2SAT     Component Value Date/Time   IRON 279 (H) 01/08/2019 0313   TIBC 202 (L) 01/08/2019 0313   FERRITIN >7,500 (H) 01/08/2019 0313   IRONPCTSAT 138 (H) 01/08/2019 0313       ASSESSMENT/PLAN:     Problem List Items Addressed This Visit      Genitourinary   AKI (acute kidney injury) ([place])   Relevant Orders   US RENAL (Completed)    Other Visit Diagnoses    Elevated LFTs    -  Primary   Alcohol abuse       Abnormal LFTs       Relevant Orders   US Abdomen Limited RUQ (Completed)      1.  Baseline serum creatinine 0.8 mg/dL.  2.  Acute kidney injury.  In the setting of acute liver failure, hepatorenal syndrome would be on the list of differentials.  He does not clearly appear like hepatorenal syndrome clinically, will check urine sodium to better evaluate.  If his urine sodium is low, would begin treatment for hepatorenal with midodrine, octreotide, and albumin.  I suspect this is more related to contrast-induced nephropathy.  His BUN being minimally elevated certainly suggest this.  He appears to be euvolemic to a bit hypervolemic on examination.  Would not recommend additional IV fluids at this juncture.  Continue to monitor daily labs.  No urgent indication for dialysis.  3.  Acute liver injury.  Question etiology.  Question component of alcoholic hepatitis versus shock liver.  Numbers are downtrending.  Mental status is stable.  4.  Anemia.  This is certainly mild.  Continue to trend.    [place], DO, [place]

## 2019-01-08 NOTE — Progress Notes (Signed)
Coude indwelling catheter Fr. 18 inserted without resistance. A pink-tinged urine return  approximately 5cc noted in the foley bag. Patient denies pain or discomfort on insertion. Bladder scanned and showed  30cc bladder residual. Foley bag anchored and hung at the end of the bed. Charge RN made aware.

## 2019-01-09 ENCOUNTER — Encounter (HOSPITAL_COMMUNITY): Payer: Self-pay | Admitting: Interventional Radiology

## 2019-01-09 ENCOUNTER — Inpatient Hospital Stay (HOSPITAL_COMMUNITY): Payer: Medicaid Other

## 2019-01-09 DIAGNOSIS — D649 Anemia, unspecified: Secondary | ICD-10-CM

## 2019-01-09 DIAGNOSIS — D696 Thrombocytopenia, unspecified: Secondary | ICD-10-CM

## 2019-01-09 HISTORY — PX: IR US GUIDE VASC ACCESS RIGHT: IMG2390

## 2019-01-09 HISTORY — PX: IR FLUORO GUIDE CV LINE RIGHT: IMG2283

## 2019-01-09 LAB — URINALYSIS, ROUTINE W REFLEX MICROSCOPIC
Bilirubin Urine: NEGATIVE
Glucose, UA: NEGATIVE mg/dL
Ketones, ur: NEGATIVE mg/dL
Leukocytes,Ua: NEGATIVE
Nitrite: NEGATIVE
Protein, ur: 100 mg/dL — AB
RBC / HPF: >50 RBC/hpf — ABNORMAL HIGH (ref 0–5)
Specific Gravity, Urine: 1.015 (ref 1.005–1.030)
pH: 6 (ref 5.0–8.0)

## 2019-01-09 LAB — COMPREHENSIVE METABOLIC PANEL
ALT: 907 U/L — AB (ref 0–44)
AST: 727 U/L — AB (ref 15–41)
Albumin: 2.9 g/dL — ABNORMAL LOW (ref 3.5–5.0)
Alkaline Phosphatase: 145 U/L — ABNORMAL HIGH (ref 38–126)
Anion gap: 11 (ref 5–15)
BILIRUBIN TOTAL: 1.8 mg/dL — AB (ref 0.3–1.2)
BUN: 28 mg/dL — ABNORMAL HIGH (ref 8–23)
CALCIUM: 7.6 mg/dL — AB (ref 8.9–10.3)
CO2: 16 mmol/L — ABNORMAL LOW (ref 22–32)
Chloride: 108 mmol/L (ref 98–111)
Creatinine, Ser: 7.75 mg/dL — ABNORMAL HIGH (ref 0.61–1.24)
GFR calc Af Amer: 8 mL/min — ABNORMAL LOW (ref >60)
GFR calc non Af Amer: 7 mL/min — ABNORMAL LOW (ref >60)
GLUCOSE: 76 mg/dL (ref 70–99)
Potassium: 3.5 mmol/L (ref 3.5–5.1)
Sodium: 135 mmol/L (ref 135–145)
TOTAL PROTEIN: 5.8 g/dL — AB (ref 6.5–8.1)

## 2019-01-09 LAB — ANTI-SMOOTH MUSCLE ANTIBODY, IGG: F-Actin IgG: 5 Units (ref 0–19)

## 2019-01-09 LAB — ANTINUCLEAR ANTIBODIES, IFA: ANA Ab, IFA: NEGATIVE

## 2019-01-09 LAB — CBC WITH DIFFERENTIAL/PLATELET
Abs Immature Granulocytes: 0.09 10*3/uL — ABNORMAL HIGH (ref 0.00–0.07)
Basophils Absolute: 0.1 10*3/uL (ref 0.0–0.1)
Basophils Relative: 1 %
EOS PCT: 2 %
Eosinophils Absolute: 0.1 10*3/uL (ref 0.0–0.5)
HCT: 32.8 % — ABNORMAL LOW (ref 39.0–52.0)
Hemoglobin: 10.8 g/dL — ABNORMAL LOW (ref 13.0–17.0)
Immature Granulocytes: 2 %
Lymphocytes Relative: 20 %
Lymphs Abs: 1.2 10*3/uL (ref 0.7–4.0)
MCH: 20.5 pg — ABNORMAL LOW (ref 26.0–34.0)
MCHC: 32.9 g/dL (ref 30.0–36.0)
MCV: 62.1 fL — ABNORMAL LOW (ref 80.0–100.0)
Monocytes Absolute: 0.7 10*3/uL (ref 0.1–1.0)
Monocytes Relative: 12 %
NRBC: 0.3 % — AB (ref 0.0–0.2)
Neutro Abs: 3.8 10*3/uL (ref 1.7–7.7)
Neutrophils Relative %: 63 %
Platelets: 103 10*3/uL — ABNORMAL LOW (ref 150–400)
RBC: 5.28 MIL/uL (ref 4.22–5.81)
RDW: 16.9 % — ABNORMAL HIGH (ref 11.5–15.5)
WBC: 5.9 10*3/uL (ref 4.0–10.5)

## 2019-01-09 LAB — SODIUM, URINE, RANDOM: Sodium, Ur: 81 mmol/L

## 2019-01-09 LAB — HEPATITIS PANEL, ACUTE
HCV AB: 0.1 {s_co_ratio} (ref 0.0–0.9)
HEP A IGM: NEGATIVE
HEP B S AG: NEGATIVE
Hep B C IgM: NEGATIVE

## 2019-01-09 LAB — PHOSPHORUS: PHOSPHORUS: 4 mg/dL (ref 2.5–4.6)

## 2019-01-09 LAB — ANTI-MICROSOMAL ANTIBODY LIVER / KIDNEY: LKM1 Ab: 0.9 Units (ref 0.0–20.0)

## 2019-01-09 LAB — CERULOPLASMIN: Ceruloplasmin: 19.7 mg/dL (ref 16.0–31.0)

## 2019-01-09 LAB — MAGNESIUM: Magnesium: 1.7 mg/dL (ref 1.7–2.4)

## 2019-01-09 LAB — PROTIME-INR
INR: 1.1 (ref 0.8–1.2)
Prothrombin Time: 14.5 seconds (ref 11.4–15.2)

## 2019-01-09 LAB — HEPATITIS B SURFACE ANTIGEN: Hepatitis B Surface Ag: NEGATIVE

## 2019-01-09 LAB — CREATININE, URINE, RANDOM: Creatinine, Urine: 73.28 mg/dL

## 2019-01-09 MED ORDER — LIDOCAINE HCL 1 % IJ SOLN
INTRAMUSCULAR | Status: AC
  Filled 2019-01-09: qty 20

## 2019-01-09 MED ORDER — CHLORHEXIDINE GLUCONATE CLOTH 2 % EX PADS
6.0000 | MEDICATED_PAD | Freq: Every day | CUTANEOUS | Status: DC
  Administered 2019-01-10 – 2019-01-11 (×2): 6 via TOPICAL

## 2019-01-09 MED ORDER — LIDOCAINE HCL 1 % IJ SOLN
INTRAMUSCULAR | Status: DC | PRN
  Administered 2019-01-09: 5 mL

## 2019-01-09 MED ORDER — HEPARIN SODIUM (PORCINE) 1000 UNIT/ML IJ SOLN
INTRAMUSCULAR | Status: AC
  Administered 2019-01-09: 1000 [IU]
  Filled 2019-01-09: qty 3

## 2019-01-09 MED ORDER — HEPARIN SODIUM (PORCINE) 1000 UNIT/ML IJ SOLN
INTRAMUSCULAR | Status: AC
  Administered 2019-01-09: 2.8 mL
  Filled 2019-01-09: qty 1

## 2019-01-09 MED ORDER — SODIUM CHLORIDE 0.9% FLUSH
10.0000 mL | INTRAVENOUS | Status: DC | PRN
  Administered 2019-01-12: 10 mL
  Filled 2019-01-09: qty 40

## 2019-01-09 NOTE — Procedures (Signed)
Pre-procedure Diagnosis: ESRD Post-procedure Diagnosis: Same  Successful placement of a non-tunneled HD catheter with tips terminating within the superior aspect of the right atrium.    Complications: None Immediate  EBL: Minimal   The catheter is ready for immediate use.   Jay Enrico Eaddy, MD Pager #: [phone]   

## 2019-01-09 NOTE — Progress Notes (Signed)
   Patient Status: [facility] - In-pt  Assessment and Plan: Patient in need of venous access.   Temporary dialysis catheter placement  ______________________________________________________________________   History of Present Illness: Bradley Ray is a 63 y.o. male   Polysub abuse; HTN; HLD Came to ED for CP Dissection ruled out with CT Noted high creatinine Pt producing little to no urine Worsening renal function  Scheduled now for temporary dialysis catheter  Allergies and medications reviewed.   Review of Systems: A 12 point ROS discussed and pertinent positives are indicated in the HPI above.  All other systems are negative.   Vital Signs: BP 136/89   Pulse 87   Temp 100.1 F (37.8 C) (Oral)   Resp 18   Ht 5\' 5"  (1.651 m)   Wt 181 lb 11.2 oz (82.4 kg)   SpO2 94%   BMI 30.24 kg/m   Physical Exam Vitals signs reviewed.  Skin:    General: Skin is warm.  Neurological:     Mental Status: He is alert.  Psychiatric:        Mood and Affect: Mood normal.        Behavior: Behavior normal.        Thought Content: Thought content normal.        Judgment: Judgment normal.      Imaging reviewed.   Labs:  COAGS: Recent Labs    01/06/19 2058 01/09/19 0429  INR 1.1 1.1  APTT 33  --     BMP: Recent Labs    01/07/19 0555 01/08/19 0313 01/08/19 1828 01/09/19 0429  NA 139 134* 135 135  K 3.4* 3.3* 3.7 3.5  CL 105 108 107 108  CO2 19* 17* 17* 16*  GLUCOSE 75 88 83 76  BUN 16 22 26* 28*  CALCIUM 7.9* 7.5* 7.6* 7.6*  CREATININE 2.43* 4.84* 6.55* 7.75*  GFRNONAA 28* 12* 8* 7*  GFRAA 32* 14* 10* 8*   Pt is scheduled for temporary dialysis catheter placement He is aware of procedure benefits and risks including but not limited to Infection; bleeding; vessel damage  Electronically Signed: Lavonia Drafts, PA-C 01/09/2019, 10:41 AM   I spent a total of 15 minutes in face to face in clinical consultation, greater than 50% of which was  counseling/coordinating care for venous access.

## 2019-01-09 NOTE — Progress Notes (Signed)
PROGRESS NOTE    Bradley Ray  WUJ:[id number] DOB: 05-26-1956 DOA: 01/06/2019 PCP: Nolene Ebbs, MD   Brief Narrative:  HPI per Dr. Shela Leff on 01/06/2019 Bradley Ray is a 63 y.o. male with medical history significant of polysubstance use, hypertension, hyperlipidemia, and conditions listed below presenting to the hospital for evaluation of chest pain.  Patient states yesterday around 4 PM he was sitting and watching TV and experienced acute onset pressure across his chest and upper abdomen.  Denies any shortness of breath or nausea at that time.  States this lasted about 45 minutes and when EMS gave him nitroglycerin he vomited.  Denies any history of CAD or prior MI.  States he quit smoking 8 years ago.  States his maternal aunt had CAD.  Denies any fevers or recent sick contacts.  States he was not having any nausea, vomiting, or abdominal pain at home but does report feeling tired lately.  States he drinks 2-3 shots of whiskey every day but yesterday drank more than usual because it was his friend's birthday.  He had a pint of liquor yesterday.  States he smokes marijuana occasionally.  Denies any other drug use. Patient denies any acetaminophen use.  Denies use of any other over-the-counter medications or supplements.  Denies any recent antibiotic use.  **LFTs wereworsening so will obtained right upper quadrant ultrasound and continued IV fluid hydration. Renal Function is also affected and worsened likely in the setting of contrast for CTA and significantly worsened yesterday and today and patient having decreased Urine Output so Nephrology Consulted.  GI recommending stopping fluid as patient appeared volume overloaded.  Yesterday patient's urine output was very minimal [place] in and out catheter was attempted however patient had a "still occluded catheter was placed with very minimal urine output with 20 cc.  Because patient's urine output significantly decreased in the last 24 hours  and his creatinine worsened allergy felt that dialysis was indicated so they have asked IR to place temporary dialysis catheter with dialysis to follow.  Assessment & Plan:   Principal Problem:   Shock liver Active Problems:   Hypertriglyceridemia   Chest pain   SIRS (systemic inflammatory response syndrome) (HCC)   AKI (acute kidney injury) ([place])  Elevated LFTs in the Setting of shock liver, improving  -On Admission AST 5520 and ALT 2204. T bili 2.2.  Coags normal. -Repeat day before yesterdayshowed AST of 8,290, ALT of 2,784, T Bili of 2.4 (Direct of 1.3 and Indirect of 1.1), GGT of 1,414, and Alk Phos of 129   -Repeat yesterday AM showed AST of 2,528, ALT of 1,385, T Bili  Worsened and is now 3.4, Alk Phos of 151   -Today AST was 727, ALT was 907, total bilirubin was 1.8, and alk phos was 145 -Platelet count normal at 224 and dropped to 103.   -No encephalopathy.  Salicylate level negative.   -Ethanol was level mildly elevated at 22.  CK normal.     -Lactic acid was 4.3 and trended down to 2.2.   -Phosphorus level 2.0 and replete and repeat this AM was 3.2.   -Extremely high LDH (greater than 10,000).  -Patient denies any Acetaminophen use but had it on his MAR -Denies use of any other over-the-counter medications or supplements.  Denies any recent antibiotic use.   -States he smokes marijuana occasionally but denies any other drug use. -UDS Negative -Acute Hepatitis Panel Pending Still; Has a Hx of Hepatitis and does have Alcoholism -Acetaminophen  Level done and <10 and Ammonia Level was not Done -Continued IV fluid hydration with normal saline but stopped yesterday -Will initiate Workup for Autoimmune Hepatitis and SMA, ANA, and LKM-1 Ab sent -Checking Ceruloplasmin and Anemia Panel with Ferritin -Anemia panel showed an iron level of 279, U IBC was not calculated, TIBC was 2 2, saturation ratio is 138%, ferritin level was greater than 7500, folate level was 26.4 and vitamin B12  level was 760 -CT Scan of Chest/Abd/Pelvis showed Unremarkable Hepatobiliary Region and Pancreas -Gastroenterology consulted for further evaluation and recommendations and recommending Increasing IVF and considering Nephrology consultation -Avoid all Hepatotoxic and Nephrotoxic Medications  -Checked RUQ U/S showed No explanation for elevated LFTs. No gallstones. Tiny gallbladder polyp. Given size less than 6 mm, no further evaluation or follow-up recommended. -Will Continue to Monitor and Trend Liver Fxn and it is trending  -Gastroenterology recommended yesterday rechecking coagulation factors and they expected both bilirubin and INR to rise his transaminases decrease and will continue monitoring -PT/INR was 14.5/1.1 respectively and T Bili was down to 1.8 -Gust with gastroenterology Dr. Alessandra Bevels and he feels that his LFT worsening may have been ischemic (Ischemic Hepatitis) but we will continue to monitor and await acute hepatitis panel as they are trending down   Severe Hypertriglyceridemia/HLD -Lipid panel showing triglycerides 4875, cholesterol 333, and HDL 18. ?whether related to shock liver. Unable to Calculate LDL -Encouraged cessation of alcohol use.   -Low-fat diet.  -Avoid starting a statin or fibrate at this time in the setting of acute liver failure. -Takes Atorvastatin 40 mg po qHS as an outpatient and currently being held due to Liver Fxn  Chest Pain, improved -Does have significant risk factors for CAD including age, gender, hypertension, hyperlipidemia, prior tobacco use, and family history.   -Troponin x 2 negative and repeats flat at 0.03 x2.   EKG not suggestive of ACS.  Currently not complaining of chest pain.   -Chest x-ray showing no active cardiopulmonary disease.   -CT angiogram negative for dissection and inconclusive for PE.  -Not hypoxic.   -Lipid panel showing low HDL and severe hypertriglyceridemia.   -Echo done in February 2017 with possible hypokinesis of  the mid inferoseptal and apical septal myocardium.  -Received aspirin 324 mg and 0.4 mg nitro by EMS. -Cardiac monitoring.   -C/w Sublingual nitroglycerin as needed.   -Repeating Echocardiogram this visit -May discuss with Cardiology after GI Issues are resolved   SIRS -Afebrile.  No leukocytosis and WBC went from 5.5 -> 7.5 -> 6.9 -> 5.9. -Was Tachycardic and tachypneic.  Not hypotensive.   -Lactic acid elevated at 4.3 in the setting of shock liver and improved to 2.2.   -Chest x-ray not suggestive of pneumonia. -C/w IVF as below -Continue to trend lactate and trended down.   -Checked UA only and showed cloudy urine, amber color, 50 glucose, moderate hemoglobin, negative ketones, negative nitrites, rare bacteria, 11-20 mucus and 21-50 WBCs -Repeat UA showed a turbid appearance with amber color urine, large hemoglobin, negative ketones, negative nitrites, many bacteria, greater than 50 RBCs, and 21-50 WBCs. -Continue to monitor hemodynamics.  AKI, significantly worsened  -On Admission Creatinine 1.10, baseline 0.8. -BUN/Cr worsened to 16/2.43 and in setting of Contrast Exposure and now further worsening as going from 22/4.84 -> 26/6.55 -> 28/7.75 -Avoid nephrotoxic agents and contrast agentes -IVF Hydration with NS at 150 mL/hr stopped as patient was appearing puffy and volume overloaded  -Continue to Monitor and Trend Renal Fxn -Checked Renal U/S and  showed No hydronephrosis. Increased renal echogenicity, suggesting medical renal disease. Perinephric fluid is identified bilaterally. This is nonspecific but likely related to renal insufficiency. -Nephrology consulted for further evaluation and recommendations -Patient had I and O Catheter Attempted but unsuccessful as it coiled and so Coude Placed -Patient is essential anuric and had 20 mL in Foley Bag -Patient to get [facility] and Dialysis per Nephrology today   Hypokalemia -Potassium 3.2 on admission and is now 3.5.   -Magnesium is now  1.7. -Continue to Monitor and Replete as Necessary -Repeat CMP in AM  Hypophosphatemia -Patient's Phos Level was 2.0 and is now 4.0 -Continue to [place] and Replete as Necessary -Repeat Phos Level in AM    High anion gap metabolic acidosis, improved  -Suspect secondary to lactic acidosis from shock liver.  -Salicylate level negative.   -On Admission Bicarb 17, anion gap 25.  Blood glucose 103. -Now AG is 11 but CO2 is 63 -C/w IVF Hydration as above  Concern for Large vessel vasculitis -CT showing some unusual perivascular stranding along the abdominal aorta, left common femoral artery, and possibly along the left lower neck extending into the upper mediastinum; suspicion for large vessel vasculitis. -Checked ESR and CRP levels and CRP was <0.8 and ESR was 1 -Continue to Monitor and will consider further workup  Alcohol Dependence/Alcoholism -Drinks at least a pint a day -C/w CIWA monitoring per PRotocol; Ativan PRN -C/w Thiamine, folate, multivitamin.  Obesity -Estimated body mass index is 30.24 kg/m as calculated from the following:   Height as of this encounter: '5\' 5"'$  (1.651 m).   Weight as of this encounter: 82.4 kg. -Weight Loss and Dietary Counseling given  Normocytic Anemia -Patient's Hb/Hct trending down and went from 11.3/35.1 -> 32.8/62.1 -Checked Anemia Panel and showed a level of 279, U IBC was not calculated, TIBC was 202, saturation ratios were 138, ferritin levels greater than 7500, folate level 26.4, and vitamin B12 level 760. -Continue to Monitor for S/Sx of Bleeding -Repeat CBC in AM   Thrombocytopenia, worsening   -Platelet Count is dropping since admission and went from 224 -> 181 -> 132 -> 103 -Hold and Discontinue Enoxaparin 40 mg sq q24h -Continue to Monitor for S/Sx of Bleeding -Repeat CBC in AM   DVT prophylaxis: SCDs Code Status: FULL CODE Family Communication: No family present at bedside  Disposition Plan: [place] for continued  workup and treatment as Renal Fxn has significantly worsened   Consultants:   Gastroenterology   Nephrology   IR  Procedures:  ECHOCARDIOGRAM IMPRESSIONS    1. The left ventricle has normal systolic function, with an ejection fraction of 55-60%. The cavity size was mildly dilated. Left ventricular diastolic Doppler parameters are consistent with impaired relaxation.  2. The right ventricle has normal systolc function. The cavity was normal. There is no increase in right ventricular wall thickness.  3. No evidence present in the left atrial appendage.  4. There is mild to moderate mitral annular calcification present.  5. The aortic valve is tricuspid Mild sclerosis of the aortic valve. Aortic valve regurgitation was not assessed by color flow Doppler.  6. No pulmonic valve vegetation visualized.  7. The interatrial septum appears to be lipomatous.  FINDINGS  Left Ventricle: The left ventricle has normal systolic function, with an ejection fraction of 55-60%. The cavity size was mildly dilated. There is no increase in left ventricular wall thickness. Left ventricular diastolic Doppler parameters are  consistent with impaired relaxation (grade I). Normal left ventricular filling  pressures.   Right Ventricle: The right ventricle has normal systolic function. The cavity was normal. There is no increase in right ventricular wall thickness. Left Atrium: Left atrial size was normal in size. Left Atrial Appendage: No evidence of a thrombus present in the left atrial appendage. Right Atrium: Right atrial size was normal in size. Right atrial pressure is estimated at 3 mmHg. Interatrial Septum: No atrial level shunt detected by color flow Doppler. Increased thickness of the atrial septum sparing the fossa ovalis consistent with The interatrial septum appears to be lipomatous. Pericardium: There is no evidence of pericardial effusion. Mitral Valve: The mitral valve is normal in structure.  There is mild to moderate mitral annular calcification present. Mitral valve regurgitation is trivial by color flow Doppler. Tricuspid Valve: The tricuspid valve was normal in structure. Tricuspid valve regurgitation is mild by color flow Doppler. Aortic Valve: The aortic valve is tricuspid Mild sclerosis of the aortic valve. Aortic valve regurgitation was not assessed by color flow Doppler. Pulmonic Valve: The pulmonic valve was normal in structure. Pulmonic valve regurgitation is trivial by color flow Doppler. No pulmonic valve vegetation visualized. Venous: The inferior vena cava is normal in size with greater than 50% respiratory variability.   LEFT VENTRICLE PLAX 2D LVIDd:         5.50 cm  Diastology LVIDs:         3.80 cm  LV e' lateral:   7.07 cm/s LV PW:         0.90 cm  LV E/e' lateral: 8.2 LV IVS:        0.90 cm  LV e' medial:    7.72 cm/s LVOT diam:     2.30 cm  LV E/e' medial:  7.6 LV SV:         85 ml LV SV Index:   44.43 LVOT Area:     4.15 cm  LEFT ATRIUM         Index      RIGHT ATRIUM LA diam:    4.30 cm 2.31 cm/m RA Pressure: 3 mmHg  AORTIC VALVE LVOT Vmax:   92.70 cm/s LVOT Vmean:  63.400 cm/s LVOT VTI:    0.179 m   AORTA Ao Root diam: 3.70 cm  MV E velocity: 58.30 cm/s MV A velocity: 102.00 cm/s SHUNTS MV E/A ratio:  0.57        Systemic VTI:  0.18 m                            Systemic Diam: 2.30 cm   Antimicrobials:  Anti-infectives (From admission, onward)   None     Subjective: Seen and examined at bedside states that he is not feeling well.  Has not been urinating very much.  No nausea or vomiting.  States that he feels "sick to his stomach".  No other concerns or complaints at this time and understands the plan of care going forward today..  Objective: Vitals:   01/08/19 1657 01/08/19 1939 01/09/19 0500 01/09/19 0846  BP: 135/82 133/81 (!) 144/91 136/89  Pulse: 98 85 93 87  Resp: _0 Temp: 98.5 F (36.9 C) 98.9 F (37.2 C) 100.1 F  (37.8 C)   TempSrc: Oral Oral Oral   SpO2: 97% 92% 94%   Weight:      Height:        Intake/Output Summary (Last 24 hours) at 01/09/2019 1055 Last data filed at  01/09/2019 0846 Gross per 24 hour  Intake 1506.46 ml  Output 140 ml  Net 1366.46 ml   Filed Weights   01/07/19 0225 01/07/19 0638 01/08/19 0313  Weight: 78.8 kg 78.8 kg 82.4 kg   Examination: Physical Exam:  Constitutional: Well-nourished, well-developed mildly obese African-American male currently no acute distress appears uncomfortable laying in bed.  Face is little bit more puffy and states that he had not been feeling well Eyes: Lids and Conjunctive are normal.  Sclera anicteric ENMT: Ears nose appear normal.  Grossly normal hearing.  Mucous members are moist Neck: Supple with no JVD Respiratory: Diminished auscultation bilaterally with no appreciable wheezing, rales, rhonchi.  Patient was not tachypneic or using accessory muscles to breathe Cardiovascular: Regular rate and rhythm.  No appreciable murmurs, rubs, gallops.  1+ lower extremity edema noted Abdomen: Soft, nontender.  Bowel sounds present and mildly distended secondary to body habitus.  GU: Deferred as a coud catheter and with very minimal urine output Musculoskeletal: No contractures or cyanosis.  No joint deformities upper and lower extremities Skin: Skin is warm dry no appreciable rashes or lesions on limited skin evaluation but did appear little bit puffy Neurologic: CN II through XII grossly intact no appreciable focal deficits.  Romberg sign cerebellar reflexes were not assessed Psychiatric: Normal mood and affect.  Intact judgment and insight.  Patient is awake, alert, oriented x3.  Data Reviewed: I have personally reviewed following labs and imaging studies  CBC: Recent Labs  Lab 01/06/19 1903 01/07/19 0945 01/08/19 0313 01/09/19 0429  WBC 5.5 7.5 6.9 5.9  NEUTROABS 3.7 5.8 5.3 3.8  HGB 13.1 12.0* 11.3* 10.8*  HCT 38.5* 36.8* 35.1* 32.8*    MCV 65.3* 63.7* 63.2* 62.1*  PLT 224 181 132* 782*   Basic Metabolic Panel: Recent Labs  Lab 01/06/19 1903 01/06/19 1921 01/07/19 0114 01/07/19 0555 01/08/19 0313 01/08/19 0858 01/08/19 1828 01/09/19 0429  NA 143  --   --  139 134*  --  135 135  K 3.2*  --   --  3.4* 3.3*  --  3.7 3.5  CL 101  --   --  105 108  --  107 108  CO2 17*  --   --  19* 17*  --  17* 16*  GLUCOSE 103*  --   --  75 88  --  83 76  BUN 12  --   --  16 22  --  26* 28*  CREATININE 1.28* 1.10  --  2.43* 4.84*  --  6.55* 7.75*  CALCIUM 8.5*  --   --  7.9* 7.5*  --  7.6* 7.6*  MG 1.7  --  2.3  --   --  1.7  --  1.7  PHOS  --   --  2.0*  --   --  3.2  --  4.0   GFR: Estimated Creatinine Clearance: 9.9 mL/min (A) (by C-G formula based on SCr of 7.75 mg/dL (H)). Liver Function Tests: Recent Labs  Lab 01/06/19 1903 01/07/19 0555 01/08/19 0313 01/09/19 0429  AST 5,520* 8,290* 2,528* 727*  ALT 2,204* 2,784* 1,385* 907*  ALKPHOS 108 129* 151* 145*  BILITOT 2.2* 2.4* 3.4* 1.8*  PROT 6.6 6.2* 5.4* 5.8*  ALBUMIN 3.7 3.3* 3.0* 2.9*   Recent Labs  Lab 01/06/19 1903  LIPASE 21   Recent Labs  Lab 01/07/19 0555  AMMONIA RESULTS UNAVAILABLE DUE TO INTERFERING SUBSTANCE   Coagulation Profile: Recent Labs  Lab 01/06/19 2058 01/09/19 0429  INR 1.1 1.1   Cardiac Enzymes: Recent Labs  Lab 01/06/19 1903 01/06/19 2234 01/07/19 0114 01/07/19 0555 01/07/19 1240  CKTOTAL  --  248  --   --   --   TROPONINI <0.03  --  <0.03 0.03* 0.03*   BNP (last 3 results) No results for input(s): PROBNP in the last 8760 hours. HbA1C: No results for input(s): HGBA1C in the last 72 hours. CBG: Recent Labs  Lab 01/07/19 0618  GLUCAP 72   Lipid Profile: Recent Labs    01/06/19 2058  CHOL 333*  HDL 18*  LDLCALC UNABLE TO CALCULATE IF TRIGLYCERIDE OVER 400 mg/dL  TRIG 4,875*  CHOLHDL NOT REPORTED DUE TO HIGH TRIGLYCERIDES   Thyroid Function Tests: No results for input(s): TSH, T4TOTAL, FREET4, T3FREE,  THYROIDAB in the last 72 hours. Anemia Panel: Recent Labs    01/08/19 0313  VITAMINB12 760  FOLATE 26.4  FERRITIN >7,500*  TIBC 202*  IRON 279*  RETICCTPCT QUESTIONABLE RESULTS, RECOMMEND RECOLLECT TO VERIFY   QUESTIONABLE RESULTS, RECOMMEND RECOLLECT TO VERIFY   Sepsis Labs: Recent Labs  Lab 01/07/19 0114 01/07/19 0555  LATICACIDVEN 4.3* 2.2*    No results found for this or any previous visit (from the past 240 hour(s)).   Radiology Studies: US Renal  Result Date: 01/08/2019 CLINICAL DATA:  Acute renal insufficiency. EXAM: RENAL / URINARY TRACT ULTRASOUND COMPLETE COMPARISON:  Right upper quadrant ultrasound 01/07/2019. Abdominopelvic CT 11/26/2015. FINDINGS: Right Kidney: Renal measurements: 13.1 x 6.6 x 6.4 cm = volume: 289 mL. Mildly increased echogenicity. No hydronephrosis. Trace perinephric fluid. Left Kidney: Renal measurements: 12.9 x 6.6 x 7.3 cm = volume: 326 mL. Mildly increased echogenicity. No hydronephrosis. Trace perinephric fluid. Bladder: Decompressed. IMPRESSION: 1.  No hydronephrosis. 2. Increased renal echogenicity, suggesting medical renal disease. 3. Perinephric fluid is identified bilaterally. This is nonspecific but likely related to renal insufficiency. Electronically Signed   By: Abigail Miyamoto M.D.   On: 01/08/2019 10:18   US Abdomen Limited Ruq  Result Date: 01/07/2019 CLINICAL DATA:  Abnormal LFTs. EXAM: ULTRASOUND ABDOMEN LIMITED RIGHT UPPER QUADRANT COMPARISON:  CT yesterday. FINDINGS: Gallbladder: Physiologically distended. No gallstones or wall thickening visualized. 3 mm nonshadowing polyp in the mid gallbladder. No sonographic Murphy sign noted by sonographer. Common bile duct: Diameter: 2-3 mm, normal. Liver: No focal lesion identified. Within normal limits in parenchymal echogenicity. Portal vein is patent on color Doppler imaging with normal direction of blood flow towards the liver. Incidental note of increased echogenicity of the right kidney with  trace perinephric fluid. IMPRESSION: 1. No explanation for elevated LFTs. 2. No gallstones. Tiny gallbladder polyp. Given size less than 6 mm, no further evaluation or follow-up recommended. Electronically Signed   By: Keith Rake M.D.   On: 01/07/2019 19:29   Scheduled Meds:  heparin       lidocaine       Chlorhexidine Gluconate Cloth  6 each Topical T5573   folic acid  1 mg Oral Daily   lidocaine  1 application Urethral Once   multivitamin with minerals  1 tablet Oral Daily   thiamine  100 mg Oral Daily   Or   thiamine  100 mg Intravenous Daily   Continuous Infusions:   LOS: 2 days   Kerney Elbe, DO [facility] PAGER is on AMION  If 7PM-7AM, please contact night-coverage [email or web address] Password [email or web address] 01/09/2019, 10:55 AM

## 2019-01-09 NOTE — Progress Notes (Signed)
[facility]    NEPHROLOGY PROGRESS NOTE  SUBJECTIVE:  Patient complaining of nausea and mild shortness of breath.  Had persistent nausea last night.  Denies any fevers, chills, chest pain or dysuria.  Does have some abdominal discomfort.    OBJECTIVE:  Vitals:   01/09/19 0500 01/09/19 0846  BP: (!) 144/91 136/89  Pulse: 93 87  Resp: 18   Temp: 100.1 F (37.8 C)   SpO2: 94%     Intake/Output Summary (Last 24 hours) at 01/09/2019 1243 Last data filed at 01/09/2019 0846 Gross per 24 hour  Intake 1386.46 ml  Output 90 ml  Net 1296.46 ml      General:  AAOx3 NAD, appears uncomfortable  HEENT: MMM  AT anicteric sclera Neck:  No JVD, no adenopathy CV:  Heart RRR  Lungs:  L/S CTA bilaterally Abd: Abdomen distended, nontender GU:  Bladder non-palpable Extremities: +1 bilateral LE edema. Skin:  No skin rash  MEDICATIONS:  . Chlorhexidine Gluconate Cloth  6 each Topical Q0600  . folic acid  1 mg Oral Daily  . lidocaine      . lidocaine  1 application Urethral Once  . multivitamin with minerals  1 tablet Oral Daily  . thiamine  100 mg Oral Daily   Or  . thiamine  100 mg Intravenous Daily       LABS:   CBC Latest Ref Rng & Units 01/09/2019 01/08/2019 01/07/2019  WBC 4.0 - 10.5 K/uL 5.9 6.9 7.5  Hemoglobin 13.0 - 17.0 g/dL 10.8(L) 11.3(L) 12.0(L)  Hematocrit 39.0 - 52.0 % 32.8(L) 35.1(L) 36.8(L)  Platelets 150 - 400 K/uL 103(L) 132(L) 181    CMP Latest Ref Rng & Units 01/09/2019 01/08/2019 01/08/2019  Glucose 70 - 99 mg/dL 76 83 88  BUN 8 - 23 mg/dL 28(H) 26(H) 22  Creatinine 0.61 - 1.24 mg/dL 7.75(H) 6.55(H) 4.84(H)  Sodium 135 - 145 mmol/L 135 135 134(L)  Potassium 3.5 - 5.1 mmol/L 3.5 3.7 3.3(L)  Chloride 98 - 111 mmol/L 108 107 108  CO2 22 - 32 mmol/L 16(L) 17(L) 17(L)  Calcium 8.9 - 10.3 mg/dL 7.6(L) 7.6(L) 7.5(L)  Total Protein 6.5 - 8.1 g/dL 5.8(L) - 5.4(L)  Total Bilirubin 0.3 - 1.2 mg/dL 1.8(H) - 3.4(H)  Alkaline Phos 38 - 126 U/L 145(H) -  151(H)  AST 15 - 41 U/L 727(H) - 2,528(H)  ALT 0 - 44 U/L 907(H) - 1,385(H)    Lab Results  Component Value Date   CALCIUM 7.6 (L) 01/09/2019   PHOS 4.0 01/09/2019       Component Value Date/Time   COLORURINE AMBER (A) 01/08/2019 0735   APPEARANCEUR TURBID (A) 01/08/2019 0735   LABSPEC 1.015 01/08/2019 0735   PHURINE 6.0 01/08/2019 0735   GLUCOSEU NEGATIVE 01/08/2019 0735   HGBUR LARGE (A) 01/08/2019 0735   BILIRUBINUR NEGATIVE 01/08/2019 0735   KETONESUR NEGATIVE 01/08/2019 0735   PROTEINUR 100 (A) 01/08/2019 0735   NITRITE NEGATIVE 01/08/2019 0735   LEUKOCYTESUR NEGATIVE 01/08/2019 0735   No results found for: PHART, PCO2ART, PO2ART, HCO3, TCO2, ACIDBASEDEF, O2SAT     Component Value Date/Time   IRON 279 (H) 01/08/2019 0313   TIBC 202 (L) 01/08/2019 0313   FERRITIN >7,500 (H) 01/08/2019 0313   IRONPCTSAT 138 (H) 01/08/2019 0313       ASSESSMENT/PLAN:    The patient is a 63 y.o. year old male with a past medical history significant for polysubstance abuse, hypertension, and hyperlipidemia who presented to the hospital for evaluation  of chest pain.  He was apparently sitting and watching TV and experienced acute onset pressure across his chest and upper abdomen.  It lasted approximately 45 minutes.  He reported significant back pain as well.  He was brought to the emergency department for further evaluation.  He was apparently drinking 2-3 shots of whiskey every day but drank more than usual on the day prior to admission.  A CT angiogram was done in the emergency department to rule out dissection.  His baseline creatinine from 06/08/2018 was 0.6.  On admission, his creatinine was 1.28. Renal consultation has been called for acute kidney injury.    1.  Baseline serum creatinine 0.8 mg/dL.  2.  Acute kidney injury.  In the setting of acute liver failure, hepatorenal syndrome would be on the list of differentials.  He does not clearly appear like hepatorenal syndrome clinically,  will check urine sodium to better evaluate.   His urine sodium is not low, arguing against hepatorenal syndrome.   I suspect this is more related to contrast-induced nephropathy.  His BUN being minimally elevated certainly supports ATN.  Appears more uremic today with nausea and vomiting.  We will plan for dialysis today.  Patient is agreeable.  3.  Acute liver injury.  Question etiology.  Question component of alcoholic hepatitis versus shock liver.  Numbers are downtrending.  Mental status is stable.  4.  Anemia.  This is certainly mild.  Continue to trend.    [place], DO, [place]

## 2019-01-09 NOTE — Progress Notes (Signed)
[facility] Gastroenterology Progress Note  Bradley Ray 63 y.o. Dec 23, 1955  CC: Abnormal LFTs.   Subjective: No acute GI issues overnight.  Denies any blood in the stool or black stool.  Denies abdominal pain.  ROS : negative for acute chest pain and shortness of breath.   Objective: Vital signs in last 24 hours: Vitals:   01/08/19 1939 01/09/19 0500  BP: 133/81 (!) 144/91  Pulse: 85 93  Resp: 20 18  Temp: 98.9 F (37.2 C) 100.1 F (37.8 C)  SpO2: 92% 94%    Physical Exam:  General.  Alert/oriented x3.  Not in acute distress Abdomen.  Soft, nontender, nondistended, bowel sounds present.  No peritoneal signs Psych.  Mood and affect normal  Lab Results: Recent Labs    01/08/19 0858 01/08/19 1828 01/09/19 0429  NA  --  135 135  K  --  3.7 3.5  CL  --  107 108  CO2  --  17* 16*  GLUCOSE  --  83 76  BUN  --  26* 28*  CREATININE  --  6.55* 7.75*  CALCIUM  --  7.6* 7.6*  MG 1.7  --  1.7  PHOS 3.2  --  4.0   Recent Labs    01/08/19 0313 01/09/19 0429  AST 2,528* 727*  ALT 1,385* 907*  ALKPHOS 151* 145*  BILITOT 3.4* 1.8*  PROT 5.4* 5.8*  ALBUMIN 3.0* 2.9*   Recent Labs    01/08/19 0313 01/09/19 0429  WBC 6.9 5.9  NEUTROABS 5.3 3.8  HGB 11.3* 10.8*  HCT 35.1* 32.8*  MCV 63.2* 62.1*  PLT 132* 103*   Recent Labs    01/06/19 2058 01/09/19 0429  LABPROT 13.9 14.5  INR 1.1 1.1      Assessment/Plan: -Abnormal LFTs.  Most likely from ischemic hepatitis/acute hepatitis.  Ultrasound negative for acute changes. -Acute kidney injury. - Mild anemia and Thrombocytopenia.  Recommendations ------------------------- -LFTs improving.  INR normal. hepatitis panel pending. AMA, ASMA and ANA pending.  -GI will follow   Otis Brace MD, [place] 01/09/2019, 8:14 AM  Contact #  [phone]

## 2019-01-09 NOTE — Progress Notes (Signed)
10:30 am CM attempted to talk to patient / CM assessment; patient does not want to talk at this time; CM to follow up; B [place] RN,MHA,BSN [phone]

## 2019-01-10 LAB — URINE CULTURE: Culture: NO GROWTH

## 2019-01-10 LAB — ENDOMYSIAL IGA ANTIBODY: Endomysial Ab, IgA: NEGATIVE

## 2019-01-10 LAB — CBC WITH DIFFERENTIAL/PLATELET
Band Neutrophils: 0 %
Basophils Absolute: 0 10*3/uL (ref 0.0–0.1)
Basophils Relative: 0 %
Blasts: 0 %
EOS PCT: 2 %
Eosinophils Absolute: 0.1 10*3/uL (ref 0.0–0.5)
HCT: 31.2 % — ABNORMAL LOW (ref 39.0–52.0)
HEMOGLOBIN: 10.4 g/dL — AB (ref 13.0–17.0)
Lymphocytes Relative: 13 %
Lymphs Abs: 1 10*3/uL (ref 0.7–4.0)
MCH: 20.6 pg — ABNORMAL LOW (ref 26.0–34.0)
MCHC: 33.3 g/dL (ref 30.0–36.0)
MCV: 61.9 fL — ABNORMAL LOW (ref 80.0–100.0)
Metamyelocytes Relative: 0 %
Monocytes Absolute: 0.7 10*3/uL (ref 0.1–1.0)
Monocytes Relative: 10 %
Myelocytes: 0 %
Neutro Abs: 5.6 10*3/uL (ref 1.7–7.7)
Neutrophils Relative %: 75 %
Other: 0 %
Platelets: 92 10*3/uL — ABNORMAL LOW (ref 150–400)
Promyelocytes Relative: 0 %
RBC: 5.04 MIL/uL (ref 4.22–5.81)
RDW: 17.2 % — ABNORMAL HIGH (ref 11.5–15.5)
Smear Review: DECREASED
WBC: 7.4 10*3/uL (ref 4.0–10.5)
nRBC: 0 % (ref 0.0–0.2)
nRBC: 0 /100 WBC

## 2019-01-10 LAB — COMPREHENSIVE METABOLIC PANEL
ALK PHOS: 125 U/L (ref 38–126)
ALT: 611 U/L — ABNORMAL HIGH (ref 0–44)
AST: 311 U/L — ABNORMAL HIGH (ref 15–41)
Albumin: 2.9 g/dL — ABNORMAL LOW (ref 3.5–5.0)
Anion gap: 11 (ref 5–15)
BUN: 23 mg/dL (ref 8–23)
CO2: 21 mmol/L — AB (ref 22–32)
Calcium: 7.7 mg/dL — ABNORMAL LOW (ref 8.9–10.3)
Chloride: 103 mmol/L (ref 98–111)
Creatinine, Ser: 7.6 mg/dL — ABNORMAL HIGH (ref 0.61–1.24)
GFR calc Af Amer: 8 mL/min — ABNORMAL LOW (ref >60)
GFR calc non Af Amer: 7 mL/min — ABNORMAL LOW (ref >60)
Glucose, Bld: 88 mg/dL (ref 70–99)
Potassium: 3.2 mmol/L — ABNORMAL LOW (ref 3.5–5.1)
SODIUM: 135 mmol/L (ref 135–145)
Total Bilirubin: 1.7 mg/dL — ABNORMAL HIGH (ref 0.3–1.2)
Total Protein: 5.6 g/dL — ABNORMAL LOW (ref 6.5–8.1)

## 2019-01-10 LAB — HEPATITIS B CORE ANTIBODY, TOTAL: Hep B Core Total Ab: NEGATIVE

## 2019-01-10 LAB — PHOSPHORUS: Phosphorus: 3.6 mg/dL (ref 2.5–4.6)

## 2019-01-10 LAB — MAGNESIUM: Magnesium: 1.7 mg/dL (ref 1.7–2.4)

## 2019-01-10 LAB — HEPATITIS B SURFACE ANTIBODY,QUALITATIVE: Hep B S Ab: NONREACTIVE

## 2019-01-10 MED ORDER — POTASSIUM CHLORIDE CRYS ER 20 MEQ PO TBCR
40.0000 meq | EXTENDED_RELEASE_TABLET | Freq: Once | ORAL | Status: AC
  Administered 2019-01-10: 40 meq via ORAL
  Filled 2019-01-10: qty 2

## 2019-01-10 NOTE — Progress Notes (Signed)
[facility] Gastroenterology Progress Note  Bradley Ray 63 y.o. 1955-12-17  CC: Abnormal LFTs.   Subjective: No acute GI issues overnight.  Complaining of cough and abdominal soreness from cough.  Denies blood in stool black stool.     Objective: Vital signs in last 24 hours: Vitals:   01/09/19 1928 01/10/19 0413  BP: (!) 160/100 136/87  Pulse: 96 94  Resp: 16 18  Temp: 99.9 F (37.7 C) 99.2 F (37.3 C)  SpO2: 92% 93%    Physical Exam:  General.  Alert/oriented x3.  Not in acute distress Abdomen.  Soft, nontender, nondistended, bowel sounds present.  No peritoneal signs Psych.  Mood and affect normal  Lab Results: Recent Labs    01/09/19 0429 01/10/19 0344  NA 135 135  K 3.5 3.2*  CL 108 103  CO2 16* 21*  GLUCOSE 76 88  BUN 28* 23  CREATININE 7.75* 7.60*  CALCIUM 7.6* 7.7*  MG 1.7 1.7  PHOS 4.0 3.6   Recent Labs    01/09/19 0429 01/10/19 0344  AST 727* 311*  ALT 907* 611*  ALKPHOS 145* 125  BILITOT 1.8* 1.7*  PROT 5.8* 5.6*  ALBUMIN 2.9* 2.9*   Recent Labs    01/09/19 0429 01/10/19 0344  WBC 5.9 7.4  NEUTROABS 3.8 5.6  HGB 10.8* 10.4*  HCT 32.8* 31.2*  MCV 62.1* 61.9*  PLT 103* 92*   Recent Labs    01/09/19 0429  LABPROT 14.5  INR 1.1      Assessment/Plan: -Abnormal LFTs.  Most likely from ischemic hepatitis/acute hepatitis.  Ultrasound negative for acute changes. -Acute kidney injury. - Mild anemia and Thrombocytopenia.  Recommendations ------------------------- -LFTs improving.  INR normal. hepatitis panel , ASMA and ANA  Negative.  -Recommend periodically checking LFTs. -No further inpatient GI work-up planned.  GI will sign off.  Call us back if needed.  Follow-up in GI clinic in 4 to 6 weeks after discharge.   Otis Brace MD, [place] 01/10/2019, 10:18 AM  Contact #  [phone]

## 2019-01-10 NOTE — TOC Initial Note (Signed)
Transition of Care [facility]) - Initial/Assessment Note    Patient Details  Name: Bradley Ray MRN: [id number] Date of Birth: 1955/11/21  [facility]) CM/SW Contact:    Sherrilyn Rist Phone Number: [phone] 01/10/2019, 1:32 PM  Clinical Narrative:                 PCP: Nolene Ebbs, MD; has private insurance with Medicaid with prescription drug coverage; CM following for progression of care.  Expected Discharge Plan: Home/Self Care Barriers to Discharge: No Barriers Identified   Patient Goals and CMS Choice Patient states their goals for this hospitalization and ongoing recovery are:: to go home CMS Medicare.gov Compare Post Acute Care list provided to:: Patient Choice offered to / list presented to : NA  Expected Discharge Plan and Services Expected Discharge Plan: Home/Self Care   Discharge Planning Services: NA Post Acute Care Choice: NA Living arrangements for the past 2 months: Single Family Home                 DME Arranged: N/A DME Agency: NA HH Arranged: NA HH Agency: NA  Prior Living Arrangements/Services Living arrangements for the past 2 months: Single Family Home Lives with:: Self          Need for Family Participation in Patient Care: No (Comment) Care giver support system in place?: Yes (comment)   Criminal Activity/Legal Involvement Pertinent to Current Situation/Hospitalization: No - Comment as needed  Activities of Daily Living Home Assistive Devices/Equipment: Cane (specify quad or straight) ADL Screening (condition at time of admission) Patient's cognitive ability adequate to safely complete daily activities?: Yes Is the patient deaf or have difficulty hearing?: No Does the patient have difficulty seeing, even when wearing glasses/contacts?: No Does the patient have difficulty concentrating, remembering, or making decisions?: No Patient able to express need for assistance with ADLs?: Yes Does the patient have  difficulty dressing or bathing?: No Independently performs ADLs?: Yes (appropriate for developmental age) Does the patient have difficulty walking or climbing stairs?: Yes Weakness of Legs: None Weakness of Arms/Hands: None  Permission Sought/Granted Permission sought to share information with : Case Manager                Emotional Assessment Appearance:: Developmentally appropriate Attitude/Demeanor/Rapport: Gracious Affect (typically observed): Accepting Orientation: : Oriented to Self, Oriented to  Time, Oriented to Place, Oriented to Situation Alcohol / Substance Use: Not Applicable Psych Involvement: No (comment)  Admission diagnosis:  Alcohol abuse [F10.10] Elevated LFTs [R94.5] Patient Active Problem List   Diagnosis Date Noted  . Hypertriglyceridemia 01/07/2019  . Chest pain 01/07/2019  . SIRS (systemic inflammatory response syndrome) ([place]) 01/07/2019  . AKI (acute kidney injury) ([place]) 01/07/2019  . Shock liver 01/07/2019  . 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 Malignant neoplasm of prostate ([place]) 08/10/2016  . 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 Essential hypertension 11/26/2015  . 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 High anion gap metabolic acidosis 34/74/2595  . Substance abuse in remission ([place]) 11/26/2015   PCP:  Nolene Ebbs, MD Pharmacy:   [facility] [place], [place] [address] [place] [address] Phone: [phone] Fax: [phone]     Social Determinants of Health (SDOH) Interventions    Readmission Risk Interventions No flowsheet data found.

## 2019-01-10 NOTE — Progress Notes (Signed)
PROGRESS NOTE    Bradley Ray  KTG:[id number] DOB: [birth year] DOA: 01/06/2019 PCP: Nolene Ebbs, MD   Brief Narrative:  HPI per Dr. Shela Leff on 01/06/2019 Bradley Ray is a 63 y.o. male with medical history significant of polysubstance use, hypertension, hyperlipidemia, and conditions listed below presenting to the hospital for evaluation of chest pain.  Patient states yesterday around 4 PM he was sitting and watching TV and experienced acute onset pressure across his chest and upper abdomen.  Denies any shortness of breath or nausea at that time.  States this lasted about 45 minutes and when EMS gave him nitroglycerin he vomited.  Denies any history of CAD or prior MI.  States he quit smoking 8 years ago.  States his maternal aunt had CAD.  Denies any fevers or recent sick contacts.  States he was not having any nausea, vomiting, or abdominal pain at home but does report feeling tired lately.  States he drinks 2-3 shots of whiskey every day but yesterday drank more than usual because it was his friend's birthday.  He had a pint of liquor yesterday.  States he smokes marijuana occasionally.  Denies any other drug use. Patient denies any acetaminophen use.  Denies use of any other over-the-counter medications or supplements.  Denies any recent antibiotic use.  **LFTs wereworsening so will obtained right upper quadrant ultrasound and continued IV fluid hydration. Renal Function is also affected and worsened likely in the setting of contrast for CTA and significantly worsened yesterday and today and patient having decreased Urine Output so Nephrology Consulted.  GI recommending stopping fluid as patient appeared volume overloaded.  Yesterday patient's urine output was very minimal [place] in and out catheter was attempted however patient had a "still occluded catheter was placed with very minimal urine output with 20 cc.  Because patient's urine output significantly decreased in the last 24 hours  and his creatinine worsened allergy felt that dialysis was indicated so they have asked IR to place temporary dialysis catheter with dialysis to follow.  Assessment & Plan:   Principal Problem:   Shock liver Active Problems:   Hypertriglyceridemia   Chest pain   SIRS (systemic inflammatory response syndrome) (HCC)   AKI (acute kidney injury) ([place])  Elevated LFTs in the Setting of shock liver, improving  -On Admission AST 5520 and ALT 2204. T bili 2.2.  Coags normal. -Today AST was 311, ALT was 611, total bilirubin was 1.7 and alk phos was 125 -Platelet count normal at 224 and dropped to 103.   -No encephalopathy.  Salicylate level negative.   -Ethanol was level mildly elevated at 22.  CK normal.     -Lactic acid was 4.3 and trended down to 2.2.   -Phosphorus level 2.0 and replete and repeat this AM was 3.2.   -Extremely high LDH (greater than 10,000).  -Patient denies any Acetaminophen use but had it on his MAR -Denies use of any other over-the-counter medications or supplements.  Denies any recent antibiotic use.   -States he smokes marijuana occasionally but denies any other drug use. -UDS Negative -Acute Hepatitis Panel Negative; Has a Hx of Hepatitis and does have Alcoholism -Acetaminophen Level done and <10 and Ammonia Level was not Done -Continued IV fluid hydration with normal saline but stopped yesterday -Will initiate Workup for Autoimmune Hepatitis and SMA, ANA, and LKM-1 Ab sent; They were al Negative and SMA is still pending  -Checking Ceruloplasmin and Anemia Panel with Ferritin; Ceruloplasmin was 19.7 -Anemia panel showed  an iron level of 279, U IBC was not calculated, TIBC was 2 2, saturation ratio is 138%, ferritin level was greater than 7500, folate level was 26.4 and vitamin B12 level was 760 -CT Scan of Chest/Abd/Pelvis showed Unremarkable Hepatobiliary Region and Pancreas -Gastroenterology consulted for further evaluation and recommendations and recommending  Increasing IVF and considering Nephrology consultation -Avoid all Hepatotoxic and Nephrotoxic Medications  -Checked RUQ U/S showed No explanation for elevated LFTs. No gallstones. Tiny gallbladder polyp. Given size less than 6 mm, no further evaluation or follow-up recommended. -Will Continue to Monitor and Trend Liver Fxn and it is trending  -Gastroenterology recommended yesterday rechecking coagulation factors and they expected both bilirubin and INR to rise his transaminases decrease and will continue monitoring -PT/INR was 14.5/1.1 respectively and T Bili was down to 1.8 -Gust with gastroenterology Dr. Alessandra Bevels and he feels that his LFT worsening may have been ischemic (Ischemic Hepatitis) but we will continue to monitor and await acute hepatitis panel as they are trending down   Severe Hypertriglyceridemia/HLD -Lipid panel showing triglycerides 4875, cholesterol 333, and HDL 18. ?whether related to shock liver. Unable to Calculate LDL -Encouraged cessation of alcohol use.   -Low-fat diet.  -Avoid starting a statin or fibrate at this time in the setting of acute liver failure. -Takes Atorvastatin 40 mg po qHS as an outpatient and currently being held due to Liver Fxn  Chest Pain, improved -Does have significant risk factors for CAD including age, gender, hypertension, hyperlipidemia, prior tobacco use, and family history.   -Troponin x 2 negative and repeats flat at 0.03 x2.   EKG not suggestive of ACS.  Currently not complaining of chest pain.   -Chest x-ray showing no active cardiopulmonary disease.   -CT angiogram negative for dissection and inconclusive for PE.  -Not hypoxic.   -Lipid panel showing low HDL and severe hypertriglyceridemia.   -Echo done in February 2017 with possible hypokinesis of the mid inferoseptal and apical septal myocardium.  -Received aspirin 324 mg and 0.4 mg nitro by EMS. -Cardiac monitoring.   -C/w Sublingual nitroglycerin as needed.   -Repeating  Echocardiogram this visit -May discuss with Cardiology after GI Issues are resolved   SIRS -Afebrile.  No leukocytosis and WBC went from 5.5 -> 7.5 -> 6.9 -> 5.9 -> 7.4 -Was Tachycardic and tachypneic on admission.  Not hypotensive.   -Lactic acid elevated at 4.3 in the setting of shock liver and improved to 2.2.   -Chest x-ray not suggestive of pneumonia. -C/w IVF as below -Continue to trend lactate and trended down.   -Checked UA only and showed cloudy urine, amber color, 50 glucose, moderate hemoglobin, negative ketones, negative nitrites, rare bacteria, 11-20 mucus and 21-50 WBCs -Repeat UA showed a turbid appearance with amber color urine, large hemoglobin, negative ketones, negative nitrites, many bacteria, greater than 50 RBCs, and 21-50 WBCs. -Continue to monitor hemodynamics.  AKI, significantly worsened  -On Admission Creatinine 1.10, baseline 0.8. -BUN/Cr worsened to 16/2.43 and in setting of Contrast Exposure and now further worsening as going from 22/4.84 -> 26/6.55 -> 28/7.75; Received Dialysis and BUN/Cr is no 23/7.60 -Avoid nephrotoxic agents and contrast agentes -IVF Hydration with NS at 150 mL/hr stopped as patient was appearing puffy and volume overloaded  -Continue to Monitor and Trend Renal Fxn -Checked Renal U/S and showed No hydronephrosis. Increased renal echogenicity, suggesting medical renal disease. Perinephric fluid is identified bilaterally. This is nonspecific but likely related to renal insufficiency. -Nephrology consulted for further evaluation and recommendations -Patient  had I and O Catheter Attempted but unsuccessful as it coiled and so Coude Placed -Patient was essentially anuric and had 20 mL in Foley Bag yesterday and received Dialysis -Nephrology watching today and will not Dialyze -Further Care per Nephrology   Hypokalemia -Potassium 3.2 on admission and is now 3.2 today.   -Magnesium is now 1.7. -Replete with 40 mEQ of Po KCl x1 -Continue to  Monitor and Replete as Necessary -Repeat CMP in AM  Hypophosphatemia -Patient's Phos Level was 2.0 and is now 3.6 -Continue to [place] and Replete as Necessary -Repeat Phos Level in AM    High anion gap metabolic acidosis, improved  -Suspect secondary to lactic acidosis from shock liver.  -Salicylate level negative.   -On Admission Bicarb 17, anion gap 25.  Blood glucose 103. -Now AG is 11 but CO2 is 21 -IVF Hydration now stopped  Concern for Large vessel vasculitis -CT showing some unusual perivascular stranding along the abdominal aorta, left common femoral artery, and possibly along the left lower neck extending into the upper mediastinum; suspicion for large vessel vasculitis. -Checked ESR and CRP levels and CRP was <0.8 and ESR was 1 -Continue to Monitor and will consider further workup -Follow up as an outpatient   Alcohol Dependence/Alcoholism -Drinks at least a pint a day -C/w CIWA monitoring per PRotocol; Ativan PRN per Protocol -C/w Thiamine, folate, multivitamin.  Obesity -Estimated body mass index is 31.12 kg/m as calculated from the following:   Height as of this encounter: _0  (1.651 m).   Weight as of this encounter: 84.8 kg. -Weight Loss and Dietary Counseling given  Normocytic Anemia -Patient's Hb/Hct trending down and went from 11.3/35.1 -> 32.8/62.1 -> 10.4/31.2 -Checked Anemia Panel and showed a level of 279, U IBC was not calculated, TIBC was 202, saturation ratios were 138, ferritin levels greater than 7500, folate level 26.4, and vitamin B12 level 760. -Continue to Monitor for S/Sx of Bleeding -Repeat CBC in AM   Thrombocytopenia, worsening   -Platelet Count is dropping since admission and went from 224 -> 181 -> 132 -> 103 -> 92 -Hold and Discontinue Enoxaparin 40 mg sq q24h -Continue to Monitor for S/Sx of Bleeding -Repeat CBC in AM   DVT prophylaxis: SCDs Code Status: FULL CODE Family Communication: No family present at bedside    Disposition Plan: [facility] for continued workup and treatment as Renal Fxn has significantly worsened   Consultants:   Gastroenterology   Nephrology   IR  Procedures:  ECHOCARDIOGRAM IMPRESSIONS    1. The left ventricle has normal systolic function, with an ejection fraction of 55-60%. The cavity size was mildly dilated. Left ventricular diastolic Doppler parameters are consistent with impaired relaxation.  2. The right ventricle has normal systolc function. The cavity was normal. There is no increase in right ventricular wall thickness.  3. No evidence present in the left atrial appendage.  4. There is mild to moderate mitral annular calcification present.  5. The aortic valve is tricuspid Mild sclerosis of the aortic valve. Aortic valve regurgitation was not assessed by color flow Doppler.  6. No pulmonic valve vegetation visualized.  7. The interatrial septum appears to be lipomatous.  FINDINGS  Left Ventricle: The left ventricle has normal systolic function, with an ejection fraction of 55-60%. The cavity size was mildly dilated. There is no increase in left ventricular wall thickness. Left ventricular diastolic Doppler parameters are  consistent with impaired relaxation (grade I). Normal left ventricular filling pressures.   Right Ventricle:  The right ventricle has normal systolic function. The cavity was normal. There is no increase in right ventricular wall thickness. Left Atrium: Left atrial size was normal in size. Left Atrial Appendage: No evidence of a thrombus present in the left atrial appendage. Right Atrium: Right atrial size was normal in size. Right atrial pressure is estimated at 3 mmHg. Interatrial Septum: No atrial level shunt detected by color flow Doppler. Increased thickness of the atrial septum sparing the fossa ovalis consistent with The interatrial septum appears to be lipomatous. Pericardium: There is no evidence of pericardial effusion. Mitral  Valve: The mitral valve is normal in structure. There is mild to moderate mitral annular calcification present. Mitral valve regurgitation is trivial by color flow Doppler. Tricuspid Valve: The tricuspid valve was normal in structure. Tricuspid valve regurgitation is mild by color flow Doppler. Aortic Valve: The aortic valve is tricuspid Mild sclerosis of the aortic valve. Aortic valve regurgitation was not assessed by color flow Doppler. Pulmonic Valve: The pulmonic valve was normal in structure. Pulmonic valve regurgitation is trivial by color flow Doppler. No pulmonic valve vegetation visualized. Venous: The inferior vena cava is normal in size with greater than 50% respiratory variability.   LEFT VENTRICLE PLAX 2D LVIDd:         5.50 cm  Diastology LVIDs:         3.80 cm  LV e' lateral:   7.07 cm/s LV PW:         0.90 cm  LV E/e' lateral: 8.2 LV IVS:        0.90 cm  LV e' medial:    7.72 cm/s LVOT diam:     2.30 cm  LV E/e' medial:  7.6 LV SV:         85 ml LV SV Index:   44.43 LVOT Area:     4.15 cm  LEFT ATRIUM         Index      RIGHT ATRIUM LA diam:    4.30 cm 2.31 cm/m RA Pressure: 3 mmHg  AORTIC VALVE LVOT Vmax:   92.70 cm/s LVOT Vmean:  63.400 cm/s LVOT VTI:    0.179 m   AORTA Ao Root diam: 3.70 cm  MV E velocity: 58.30 cm/s MV A velocity: 102.00 cm/s SHUNTS MV E/A ratio:  0.57        Systemic VTI:  0.18 m                            Systemic Diam: 2.30 cm   Antimicrobials:  Anti-infectives (From admission, onward)   None     Subjective: Seen and examined at bedside states that he was still feeling a little "puffy." No CP but he states he has some abdominal pain from coughing. No Lightheadedness or dizziness. Nausea and Vomiting is improved. No other concerns or complaints at this time.   Objective: Vitals:   01/09/19 1602 01/09/19 1928 01/10/19 0413 01/10/19 1228  BP: (!) 159/92 (!) 160/100 136/87 (!) 147/89  Pulse: 84 96 94 85  Resp: (!) _0 Temp: 98.4 F (36.9 C) 99.9 F (37.7 C) 99.2 F (37.3 C) 99 F (37.2 C)  TempSrc: Oral Oral Oral Oral  SpO2: 94% 92% 93% 92%  Weight: 85.6 kg  84.8 kg   Height:        Intake/Output Summary (Last 24 hours) at 01/10/2019 1447 Last data filed at 01/10/2019 1104 Gross  per 24 hour  Intake 960 ml  Output 1103 ml  Net -143 ml   Filed Weights   01/09/19 1320 01/09/19 1602 01/10/19 0413  Weight: 86.9 kg 85.6 kg 84.8 kg   Examination: Physical Exam:  Constitutional: Well-nourished, well-developed mildly obese African-American male currently no acute distress appears calm and is making more urine output today. Eyes: Lids and conjunctive are normal.  Sclera anicteric ENMT: External ears and nose appear normal.  Grossly normal hearing.  Mucous members are moist Neck: Appears supple no JVD Respiratory: Diminished auscultation bilaterally no appreciable wheezing, rales, rhonchi.  Patient not tachypneic wheezing any accessory muscles breathe Cardiovascular: Regular rate and rhythm.  No appreciable murmurs, rubs, gallops.  Has trace lower extremity edema noted Abdomen: Soft, nontender, distended secondary body habitus.  Bowel sounds present GU: Deferred but has a catheter in with increased orange urine output Musculoskeletal: No contractures or cyanosis.  No joint deformities in upper lower extremities Skin: Is warm and dry no appreciable rashes or lesions on limited skin evaluation but is a little puffy still Neurologic: Cranial nerves II through XII grossly intact no appreciable focal deficits.  Romberg sign cerebellar reflexes were not assessed Psychiatric: Normal mood and affect.  Intact judgment and insight.  Patient is awake, alert and oriented x3  Data Reviewed: I have personally reviewed following labs and imaging studies  CBC: Recent Labs  Lab 01/06/19 1903 01/07/19 0945 01/08/19 0313 01/09/19 0429 01/10/19 0344  WBC 5.5 7.5 6.9 5.9 7.4  NEUTROABS 3.7 5.8 5.3 3.8 5.6  HGB  13.1 12.0* 11.3* 10.8* 10.4*  HCT 38.5* 36.8* 35.1* 32.8* 31.2*  MCV 65.3* 63.7* 63.2* 62.1* 61.9*  PLT 224 181 132* 103* 92*   Basic Metabolic Panel: Recent Labs  Lab 01/06/19 1903  01/07/19 0114 01/07/19 0555 01/08/19 0313 01/08/19 0858 01/08/19 1828 01/09/19 0429 01/10/19 0344  NA 143  --   --  139 134*  --  135 135 135  K 3.2*  --   --  3.4* 3.3*  --  3.7 3.5 3.2*  CL 101  --   --  105 108  --  107 108 103  CO2 17*  --   --  19* 17*  --  17* 16* 21*  GLUCOSE 103*  --   --  75 88  --  83 76 88  BUN 12  --   --  16 22  --  26* 28* 23  CREATININE 1.28*   < >  --  2.43* 4.84*  --  6.55* 7.75* 7.60*  CALCIUM 8.5*  --   --  7.9* 7.5*  --  7.6* 7.6* 7.7*  MG 1.7  --  2.3  --   --  1.7  --  1.7 1.7  PHOS  --   --  2.0*  --   --  3.2  --  4.0 3.6   < > = values in this interval not displayed.   GFR: Estimated Creatinine Clearance: 10.2 mL/min (A) (by C-G formula based on SCr of 7.6 mg/dL (H)). Liver Function Tests: Recent Labs  Lab 01/06/19 1903 01/07/19 0555 01/08/19 0313 01/09/19 0429 01/10/19 0344  AST 5,520* 8,290* 2,528* 727* 311*  ALT 2,204* 2,784* 1,385* 907* 611*  ALKPHOS 108 129* 151* 145* 125  BILITOT 2.2* 2.4* 3.4* 1.8* 1.7*  PROT 6.6 6.2* 5.4* 5.8* 5.6*  ALBUMIN 3.7 3.3* 3.0* 2.9* 2.9*   Recent Labs  Lab 01/06/19 1903  LIPASE 21   Recent Labs  Lab 01/07/19 0555  AMMONIA RESULTS UNAVAILABLE DUE TO INTERFERING SUBSTANCE   Coagulation Profile: Recent Labs  Lab 01/06/19 2058 01/09/19 0429  INR 1.1 1.1   Cardiac Enzymes: Recent Labs  Lab 01/06/19 1903 01/06/19 2234 01/07/19 0114 01/07/19 0555 01/07/19 1240  CKTOTAL  --  248  --   --   --   TROPONINI <0.03  --  <0.03 0.03* 0.03*   BNP (last 3 results) No results for input(s): PROBNP in the last 8760 hours. HbA1C: No results for input(s): HGBA1C in the last 72 hours. CBG: Recent Labs  Lab 01/07/19 0618  GLUCAP 72   Lipid Profile: No results for input(s): CHOL, HDL, LDLCALC, TRIG,  CHOLHDL, LDLDIRECT in the last 72 hours. Thyroid Function Tests: No results for input(s): TSH, T4TOTAL, FREET4, T3FREE, THYROIDAB in the last 72 hours. Anemia Panel: Recent Labs    01/08/19 0313  VITAMINB12 760  FOLATE 26.4  FERRITIN >7,500*  TIBC 202*  IRON 279*  RETICCTPCT QUESTIONABLE RESULTS, RECOMMEND RECOLLECT TO VERIFY   QUESTIONABLE RESULTS, RECOMMEND RECOLLECT TO VERIFY   Sepsis Labs: Recent Labs  Lab 01/07/19 0114 01/07/19 0555  LATICACIDVEN 4.3* 2.2*    Recent Results (from the past 240 hour(s))  Culture, Urine     Status: None   Collection Time: 01/09/19  7:24 AM  Result Value Ref Range Status   Specimen Description URINE, CATHETERIZED  Final   Special Requests NONE  Final   Culture   Final    NO GROWTH Performed at [facility], [place] [address], [place], [place] [address]    Report Status 01/10/2019 FINAL  Final     Radiology Studies: Ir Fluoro Guide Cv Line Right  Result Date: 01/09/2019 INDICATION: Acute renal insufficiency. Please perform image guided placement of a temporary dialysis catheter for the initiation dialysis. EXAM: NON-TUNNELED CENTRAL VENOUS HEMODIALYSIS CATHETER PLACEMENT WITH ULTRASOUND AND FLUOROSCOPIC GUIDANCE COMPARISON:  Chest CT-01/06/2019 MEDICATIONS: None FLUOROSCOPY TIME:  12 seconds (1 mGy) COMPLICATIONS: None immediate. PROCEDURE: Informed written consent was obtained from the patient after a discussion of the risks, benefits, and alternatives to treatment. Questions regarding the procedure were encouraged and answered. The right neck and chest were prepped with chlorhexidine in a sterile fashion, and a sterile drape was applied covering the operative field. Maximum barrier sterile technique with sterile gowns and gloves were used for the procedure. A timeout was performed prior to the initiation of the procedure. After the overlying soft tissues were anesthetized, a small venotomy incision was created and a micropuncture kit was  utilized to access the internal jugular vein. Real-time ultrasound guidance was utilized for vascular access including the acquisition of a permanent ultrasound image documenting patency of the accessed vessel. The microwire was utilized to measure appropriate catheter length. A stiff glidewire was advanced to the level of the IVC. Under fluoroscopic guidance, the venotomy was serially dilated, ultimately allowing placement of a 20 cm temporary Trialysis catheter with tip ultimately terminating within the superior aspect of the right atrium. Final catheter positioning was confirmed and documented with a spot radiographic image. The catheter aspirates and flushes normally. The catheter was flushed with appropriate volume heparin dwells. The catheter exit site was secured with a 0-Prolene retention suture. A dressing was placed. The patient tolerated the procedure well without immediate post procedural complication. IMPRESSION: Successful placement of a right internal jugular approach 20 cm temporary dialysis catheter with tip terminating with in the superior aspect of the right atrium. The catheter is ready for  immediate use. PLAN: This catheter may be converted to a tunneled dialysis catheter at a later date as indicated. Electronically Signed   By: Sandi Mariscal M.D.   On: 01/09/2019 11:23   Ir US Guide Vasc Access Right  Result Date: 01/09/2019 INDICATION: Acute renal insufficiency. Please perform image guided placement of a temporary dialysis catheter for the initiation dialysis. EXAM: NON-TUNNELED CENTRAL VENOUS HEMODIALYSIS CATHETER PLACEMENT WITH ULTRASOUND AND FLUOROSCOPIC GUIDANCE COMPARISON:  Chest CT-01/06/2019 MEDICATIONS: None FLUOROSCOPY TIME:  12 seconds (1 mGy) COMPLICATIONS: None immediate. PROCEDURE: Informed written consent was obtained from the patient after a discussion of the risks, benefits, and alternatives to treatment. Questions regarding the procedure were encouraged and answered. The  right neck and chest were prepped with chlorhexidine in a sterile fashion, and a sterile drape was applied covering the operative field. Maximum barrier sterile technique with sterile gowns and gloves were used for the procedure. A timeout was performed prior to the initiation of the procedure. After the overlying soft tissues were anesthetized, a small venotomy incision was created and a micropuncture kit was utilized to access the internal jugular vein. Real-time ultrasound guidance was utilized for vascular access including the acquisition of a permanent ultrasound image documenting patency of the accessed vessel. The microwire was utilized to measure appropriate catheter length. A stiff glidewire was advanced to the level of the IVC. Under fluoroscopic guidance, the venotomy was serially dilated, ultimately allowing placement of a 20 cm temporary Trialysis catheter with tip ultimately terminating within the superior aspect of the right atrium. Final catheter positioning was confirmed and documented with a spot radiographic image. The catheter aspirates and flushes normally. The catheter was flushed with appropriate volume heparin dwells. The catheter exit site was secured with a 0-Prolene retention suture. A dressing was placed. The patient tolerated the procedure well without immediate post procedural complication. IMPRESSION: Successful placement of a right internal jugular approach 20 cm temporary dialysis catheter with tip terminating with in the superior aspect of the right atrium. The catheter is ready for immediate use. PLAN: This catheter may be converted to a tunneled dialysis catheter at a later date as indicated. Electronically Signed   By: Sandi Mariscal M.D.   On: 01/09/2019 11:23   Scheduled Meds:  Chlorhexidine Gluconate Cloth  6 each Topical [id number]   folic acid  1 mg Oral Daily   lidocaine  1 application Urethral Once   multivitamin with minerals  1 tablet Oral Daily   thiamine  100 mg  Oral Daily   Or   thiamine  100 mg Intravenous Daily   Continuous Infusions:   LOS: 3 days   Kerney Elbe, DO [facility] PAGER is on AMION  If 7PM-7AM, please contact night-coverage [email or web address] Password [facility] 01/10/2019, 2:47 PM

## 2019-01-10 NOTE — Progress Notes (Signed)
Subjective: Interval History: none. and has complaints still had N, v last pm but better this am.  Objective: Vital signs in last 24 hours: Temp:  [98.4 F (36.9 C)-99.9 F (37.7 C)] 99.2 F (37.3 C) (03/31 0413) Pulse Rate:  [80-103] 94 (03/31 0413) Resp:  [16-32] 18 (03/31 0413) BP: (124-160)/(81-100) 136/87 (03/31 0413) SpO2:  [91 %-94 %] 93 % (03/31 0413) Weight:  [84.8 kg-86.9 kg] 84.8 kg (03/31 0413) Weight change:   Intake/Output from previous day: 03/30 0701 - 03/31 0700 In: 600 [P.O.:600] Out: 1072 [Urine:70] Intake/Output this shift: No intake/output data recorded.  General appearance: alert, cooperative and no distress Neck: IJ HD cath Resp: clear to auscultation bilaterally Cardio: S1, S2 normal and systolic murmur: systolic ejection 2/6, crescendo and decrescendo at 2nd left intercostal space GI: soft, non-tender; bowel sounds normal; no masses,  no organomegaly Extremities: edema 1+  Lab Results: Recent Labs    01/09/19 0429 01/10/19 0344  WBC 5.9 7.4  HGB 10.8* 10.4*  HCT 32.8* 31.2*  PLT 103* 92*   BMET:  Recent Labs    01/09/19 0429 01/10/19 0344  NA 135 135  K 3.5 3.2*  CL 108 103  CO2 16* 21*  GLUCOSE 76 88  BUN 28* 23  CREATININE 7.75* 7.60*  CALCIUM 7.6* 7.7*   No results for input(s): PTH in the last 72 hours. Iron Studies:  Recent Labs    01/08/19 0313  IRON 279*  TIBC 202*  FERRITIN >7,500*    Studies/Results: Ir Fluoro Guide Cv Line Right  Result Date: 01/09/2019 INDICATION: Acute renal insufficiency. Please perform image guided placement of a temporary dialysis catheter for the initiation dialysis. EXAM: NON-TUNNELED CENTRAL VENOUS HEMODIALYSIS CATHETER PLACEMENT WITH ULTRASOUND AND FLUOROSCOPIC GUIDANCE COMPARISON:  Chest CT-01/06/2019 MEDICATIONS: None FLUOROSCOPY TIME:  12 seconds (1 mGy) COMPLICATIONS: None immediate. PROCEDURE: Informed written consent was obtained from the patient after a discussion of the risks,  benefits, and alternatives to treatment. Questions regarding the procedure were encouraged and answered. The right neck and chest were prepped with chlorhexidine in a sterile fashion, and a sterile drape was applied covering the operative field. Maximum barrier sterile technique with sterile gowns and gloves were used for the procedure. A timeout was performed prior to the initiation of the procedure. After the overlying soft tissues were anesthetized, a small venotomy incision was created and a micropuncture kit was utilized to access the internal jugular vein. Real-time ultrasound guidance was utilized for vascular access including the acquisition of a permanent ultrasound image documenting patency of the accessed vessel. The microwire was utilized to measure appropriate catheter length. A stiff glidewire was advanced to the level of the IVC. Under fluoroscopic guidance, the venotomy was serially dilated, ultimately allowing placement of a 20 cm temporary Trialysis catheter with tip ultimately terminating within the superior aspect of the right atrium. Final catheter positioning was confirmed and documented with a spot radiographic image. The catheter aspirates and flushes normally. The catheter was flushed with appropriate volume heparin dwells. The catheter exit site was secured with a 0-Prolene retention suture. A dressing was placed. The patient tolerated the procedure well without immediate post procedural complication. IMPRESSION: Successful placement of a right internal jugular approach 20 cm temporary dialysis catheter with tip terminating with in the superior aspect of the right atrium. The catheter is ready for immediate use. PLAN: This catheter may be converted to a tunneled dialysis catheter at a later date as indicated. Electronically Signed   By: Sandi Mariscal  M.D.   On: 01/09/2019 11:23   Ir US Guide Vasc Access Right  Result Date: 01/09/2019 INDICATION: Acute renal insufficiency. Please perform  image guided placement of a temporary dialysis catheter for the initiation dialysis. EXAM: NON-TUNNELED CENTRAL VENOUS HEMODIALYSIS CATHETER PLACEMENT WITH ULTRASOUND AND FLUOROSCOPIC GUIDANCE COMPARISON:  Chest CT-01/06/2019 MEDICATIONS: None FLUOROSCOPY TIME:  12 seconds (1 mGy) COMPLICATIONS: None immediate. PROCEDURE: Informed written consent was obtained from the patient after a discussion of the risks, benefits, and alternatives to treatment. Questions regarding the procedure were encouraged and answered. The right neck and chest were prepped with chlorhexidine in a sterile fashion, and a sterile drape was applied covering the operative field. Maximum barrier sterile technique with sterile gowns and gloves were used for the procedure. A timeout was performed prior to the initiation of the procedure. After the overlying soft tissues were anesthetized, a small venotomy incision was created and a micropuncture kit was utilized to access the internal jugular vein. Real-time ultrasound guidance was utilized for vascular access including the acquisition of a permanent ultrasound image documenting patency of the accessed vessel. The microwire was utilized to measure appropriate catheter length. A stiff glidewire was advanced to the level of the IVC. Under fluoroscopic guidance, the venotomy was serially dilated, ultimately allowing placement of a 20 cm temporary Trialysis catheter with tip ultimately terminating within the superior aspect of the right atrium. Final catheter positioning was confirmed and documented with a spot radiographic image. The catheter aspirates and flushes normally. The catheter was flushed with appropriate volume heparin dwells. The catheter exit site was secured with a 0-Prolene retention suture. A dressing was placed. The patient tolerated the procedure well without immediate post procedural complication. IMPRESSION: Successful placement of a right internal jugular approach 20 cm temporary  dialysis catheter with tip terminating with in the superior aspect of the right atrium. The catheter is ready for immediate use. PLAN: This catheter may be converted to a tunneled dialysis catheter at a later date as indicated. Electronically Signed   By: Sandi Mariscal M.D.   On: 01/09/2019 11:23    I have reviewed the patient's current medications.  Assessment/Plan: 1 AKI with CKD3.  Most likely contrast.  ?some component HRS. With acute liver injury. Making more urine 2 Acute liver injury 3 Substance abuse 4 HTN better. 5 Anemia 6 Prostate Ca P follow chem, diet,    LOS: 3 days   Jeneen Rinks Kiante Ciavarella 01/10/2019,9:48 AM

## 2019-01-10 NOTE — Plan of Care (Signed)

## 2019-01-10 NOTE — Progress Notes (Signed)
Pt had Coude foley placed on 3/29, pt states today that burning has been occurring today. MD notified. Will continue to monitor pt.

## 2019-01-11 LAB — HEPATIC FUNCTION PANEL
ALK PHOS: 109 U/L (ref 38–126)
ALT: 458 U/L — ABNORMAL HIGH (ref 0–44)
AST: 143 U/L — ABNORMAL HIGH (ref 15–41)
Albumin: 2.9 g/dL — ABNORMAL LOW (ref 3.5–5.0)
Bilirubin, Direct: 0.4 mg/dL — ABNORMAL HIGH (ref 0.0–0.2)
Indirect Bilirubin: 0.9 mg/dL (ref 0.3–0.9)
Total Bilirubin: 1.3 mg/dL — ABNORMAL HIGH (ref 0.3–1.2)
Total Protein: 6 g/dL — ABNORMAL LOW (ref 6.5–8.1)

## 2019-01-11 LAB — RENAL FUNCTION PANEL
Albumin: 2.9 g/dL — ABNORMAL LOW (ref 3.5–5.0)
Anion gap: 12 (ref 5–15)
BUN: 32 mg/dL — AB (ref 8–23)
CO2: 20 mmol/L — AB (ref 22–32)
Calcium: 8.1 mg/dL — ABNORMAL LOW (ref 8.9–10.3)
Chloride: 103 mmol/L (ref 98–111)
Creatinine, Ser: 9.89 mg/dL — ABNORMAL HIGH (ref 0.61–1.24)
GFR calc Af Amer: 6 mL/min — ABNORMAL LOW (ref >60)
GFR calc non Af Amer: 5 mL/min — ABNORMAL LOW (ref >60)
Glucose, Bld: 99 mg/dL (ref 70–99)
Phosphorus: 4 mg/dL (ref 2.5–4.6)
Potassium: 3.5 mmol/L (ref 3.5–5.1)
Sodium: 135 mmol/L (ref 135–145)

## 2019-01-11 LAB — CBC WITH DIFFERENTIAL/PLATELET
Abs Immature Granulocytes: 0.09 10*3/uL — ABNORMAL HIGH (ref 0.00–0.07)
Basophils Absolute: 0 10*3/uL (ref 0.0–0.1)
Basophils Relative: 1 %
Eosinophils Absolute: 0.3 10*3/uL (ref 0.0–0.5)
Eosinophils Relative: 3 %
HCT: 31.7 % — ABNORMAL LOW (ref 39.0–52.0)
Hemoglobin: 10.2 g/dL — ABNORMAL LOW (ref 13.0–17.0)
Immature Granulocytes: 1 %
LYMPHS ABS: 1.2 10*3/uL (ref 0.7–4.0)
Lymphocytes Relative: 14 %
MCH: 20.1 pg — ABNORMAL LOW (ref 26.0–34.0)
MCHC: 32.2 g/dL (ref 30.0–36.0)
MCV: 62.5 fL — AB (ref 80.0–100.0)
Monocytes Absolute: 0.9 10*3/uL (ref 0.1–1.0)
Monocytes Relative: 10 %
Neutro Abs: 6.1 10*3/uL (ref 1.7–7.7)
Neutrophils Relative %: 71 %
Platelets: 95 10*3/uL — ABNORMAL LOW (ref 150–400)
RBC: 5.07 MIL/uL (ref 4.22–5.81)
RDW: 17.3 % — ABNORMAL HIGH (ref 11.5–15.5)
WBC: 8.6 10*3/uL (ref 4.0–10.5)
nRBC: 0 % (ref 0.0–0.2)

## 2019-01-11 LAB — MAGNESIUM: Magnesium: 1.8 mg/dL (ref 1.7–2.4)

## 2019-01-11 MED ORDER — ALTEPLASE 2 MG IJ SOLR: 2.0000 mg | Freq: Once | INTRAMUSCULAR | Status: DC | PRN

## 2019-01-11 MED ORDER — HEPARIN SODIUM (PORCINE) 1000 UNIT/ML DIALYSIS: 1000.0000 [IU] | INTRAMUSCULAR | Status: DC | PRN

## 2019-01-11 MED ORDER — HEPARIN SODIUM (PORCINE) 5000 UNIT/ML IJ SOLN
5000.0000 [IU] | Freq: Three times a day (TID) | INTRAMUSCULAR | Status: DC
  Administered 2019-01-11 – 2019-01-18 (×20): 5000 [IU] via SUBCUTANEOUS
  Filled 2019-01-11 (×20): qty 1

## 2019-01-11 MED ORDER — LIDOCAINE HCL (PF) 1 % IJ SOLN: 5.0000 mL | INTRAMUSCULAR | Status: DC | PRN

## 2019-01-11 MED ORDER — LIDOCAINE-PRILOCAINE 2.5-2.5 % EX CREA: 1.0000 "application " | TOPICAL_CREAM | CUTANEOUS | Status: DC | PRN

## 2019-01-11 MED ORDER — CHLORHEXIDINE GLUCONATE CLOTH 2 % EX PADS
6.0000 | MEDICATED_PAD | Freq: Every day | CUTANEOUS | Status: DC
  Administered 2019-01-12 – 2019-01-13 (×2): 6 via TOPICAL

## 2019-01-11 MED ORDER — PENTAFLUOROPROP-TETRAFLUOROETH EX AERO: 1.0000 "application " | INHALATION_SPRAY | CUTANEOUS | Status: DC | PRN

## 2019-01-11 MED ORDER — HEPARIN SODIUM (PORCINE) 1000 UNIT/ML IJ SOLN
INTRAMUSCULAR | Status: AC
  Filled 2019-01-11: qty 4

## 2019-01-11 MED ORDER — SODIUM CHLORIDE 0.9 % IV SOLN: 100.0000 mL | INTRAVENOUS | Status: DC | PRN

## 2019-01-11 MED ORDER — GUAIFENESIN-DM 100-10 MG/5ML PO SYRP
5.0000 mL | ORAL_SOLUTION | ORAL | Status: DC | PRN
  Administered 2019-01-11 – 2019-01-16 (×7): 5 mL via ORAL
  Filled 2019-01-11 (×7): qty 5

## 2019-01-11 MED ORDER — HEPARIN SODIUM (PORCINE) 1000 UNIT/ML DIALYSIS: 40.0000 [IU]/kg | Freq: Once | INTRAMUSCULAR | Status: DC

## 2019-01-11 NOTE — Progress Notes (Signed)
PROGRESS NOTE  LANDER ESLICK WNU:[id number] DOB: 15-Dec-1955 DOA: 01/06/2019 PCP: Nolene Ebbs, MD  Brief History   Bradley Ray a 63 y.o.malewith medical history significant ofpolysubstance use, hypertension, hyperlipidemia, and conditions listed below presenting to the hospital for evaluation of chest pain.Patient states yesterday around 4 PM he was sitting and watching TV and experienced acute onset pressure across his chest and upper abdomen. Denies any shortness of breath or nausea at that time. States this lasted about 45 minutes and when EMS gave him nitroglycerin he vomited. Denies any history of CAD or prior MI. States he quit smoking 8 years ago. States his maternal aunt had CAD. Denies any fevers or recent sick contacts. States he was not having any nausea,vomiting, or abdominal painat home but does report feeling tired lately. States he drinks 2-3 shots of whiskey every day but yesterday drank more than usual because it was his friend's birthday. He had a pint of liquor yesterday. States he smokes marijuana occasionally. Denies any other drug use. Patient denies any acetaminophen use. Denies use of any other over-the-counter medications or supplements. Denies any recent antibiotic use.  LFTs were worsening so right upper quadrant ultrasound was obtained on 01/07/2019. It demonstrated no explanation for the elevated LFT's and no gallstones. There was a gallbladder polyp identified that measured less than 6 mm. It requires no further evaluation of follow up. He received continued IV fluids. Even so his renal function has worsened likely in the setting of contrast for CTA and significantly worsened on 01/09/2019 and 01/10/2019. The patient has had decreased urine output with I & O catherterization yielding only 20 cc. Nephrology has been consulted. The patient has had a temporary dialysis catheter placed. He has been started on dialysis. IV fluid has been stopped due to  concerns regarding fluid overload.  Consultants  . Nephrology . Gastroenterology . Interventional Radiology  Procedures  . Temporary dialysis catheter placement.  Antibiotics  . None  Interval History/Subjective  See above for interval history.  The patient is resting comfortably. He has no new complaints. Objective   Vitals:  Vitals:   01/11/19 1100 01/11/19 1141  BP: (!) 159/98 (!) 145/92  Pulse: 78 81  Resp: 18 18  Temp: 98.1 F (36.7 C) 98.5 F (36.9 C)  SpO2: 98%     Exam:  Constitutional:  . The patient is awake, alert, and oriented x 3. He has no acute distress. Respiratory:  . No increased work of breathing. . No wheezes or rhonchi. Small rales at bases bilaterally. . No tactile fremitus. Marland Kitchen Respiratory effort normal. No retractions or accessory muscle use Cardiovascular             Regular rate and rhythm             No murmurs, ectopy, or gallups . No lateral PMI. No thrills . No peripheral edema   . Diminished distal pulses Abdomen:  . Abdomen is soft, non-tender, non-distended. . No hernias, masses, or  . No HSM Musculoskeletal:  . Digits/nails BUE: no clubbing, cyanosis, petechiae, infection . exam of joints, bones, muscles of at least one of following: head/neck, RUE, LUE, RLE, LLE   o strength and tone normal, no atrophy, no abnormal movements o No tenderness, masses o Normal ROM, no contractures  . gait and station Skin:  . No rashes, lesions, ulcers . palpation of skin: no induration or nodules Neurologic:  . CN 2-12 intact . Sensation all 4 extremities intact Psychiatric:  . Mental  status o Mood, affect appropriate o Orientation to person, place, time  . judgment and insight appear intact    I have personally reviewed the following:   Today's Data  . CBC, CMP, Vitals  Scheduled Meds: . heparin      . Chlorhexidine Gluconate Cloth  6 each Topical Q0600  . Chlorhexidine Gluconate Cloth  6 each Topical Q0600  . folic acid  1  mg Oral Daily  . heparin injection (subcutaneous)  5,000 Units Subcutaneous Q8H  . lidocaine  1 application Urethral Once  . multivitamin with minerals  1 tablet Oral Daily  . thiamine  100 mg Oral Daily   Or  . thiamine  100 mg Intravenous Daily   Continuous Infusions:  Principal Problem:   Shock liver Active Problems:   Hypertriglyceridemia   Chest pain   SIRS (systemic inflammatory response syndrome) (HCC)   AKI (acute kidney injury) ([place])   A & P  Elevated LFT's: Likely due to shock liver. Right upper quadrant ultrasound unremarkable. Acute hepatitis panel negative. Slowly resolving. Autoimmune work up in place. Check GGT. Follow.  Severe hypertriglyceridemia/HLD: Alcohol cessation is counseled along with low-fat diet. Cannot start statin or fibrate due to elevated LFT's. Was taking atorvastatin PTA.  Chest Pain: Troponins flat. EKG unchanged. CTA negative for PE. Lipid panel with severe triglyceridemia. Continue telemetry. Echocardiogram demonstrates EF is 60%. Diastolic dysfunction.   SIRS: Resolving with resolution of leukocytosis, Afebrile, lactic acidosis. Likely due to UTI.  AKI: Worsening. Pt is now on dialysis with temporary catheter in place. I appreciate nephrology's assistance.  Hypokalemia: Monitor and supplement as necessary.  Hypophosphatemia: Monitor.  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 CRP was <0.8 and ESR was 1. SPEP, and ANCA will be checked.  ETOH dependence/alcoholism: CIWA protocol in place. Pt is receiving supplementary thiamine, folate, and multivitamin. Pt will need resources for etoh rehab at discharge.  Thrombocytopenia: Stable from yesterday at 95. DDx includes ETOH hepatitis/Cirrosis, Lovenox held. Monitor.  I have seen and examined this patient myself. I have spent 38  minutes in his evaluation and care.  DVT prophylaxis: SCD's Code Status: Full Code Family Communication: None present. Disposition Plan: tbd.   Drea Jurewicz, DO [facility] Direct contact: see [email or web address]  7PM-7AM contact night coverage as above 01/11/2019, 1:05 PM  LOS: 4 days    LOS: 4 days

## 2019-01-11 NOTE — Progress Notes (Signed)
Subjective: Interval History: has complaints some cough.  Objective: Vital signs in last 24 hours: Temp:  [99 F (37.2 C)] 99 F (37.2 C) (04/01 0418) Pulse Rate:  [85-87] 86 (04/01 0418) Resp:  [18-20] 20 (04/01 0418) BP: (147-148)/(89-96) 147/93 (04/01 0418) SpO2:  [92 %-97 %] 92 % (04/01 0418) Weight:  [85.1 kg] 85.1 kg (04/01 0500) Weight change: -1.805 kg  Intake/Output from previous day: 03/31 0701 - 04/01 0700 In: 1080 [P.O.:1080] Out: 186 [Urine:185; Stool:1] Intake/Output this shift: No intake/output data recorded.  General appearance: alert, cooperative, no distress and mildly obese Neck: RIJ cath Resp: rales bibasilar and rhonchi bibasilar Cardio: S1, S2 normal and systolic murmur: systolic ejection 2/6, crescendo and decrescendo at 2nd left intercostal space GI: pos bs, liver down 6 cm Extremities: edema 2+  Lab Results: Recent Labs    01/10/19 0344 01/11/19 0417  WBC 7.4 8.6  HGB 10.4* 10.2*  HCT 31.2* 31.7*  PLT 92* 95*   BMET:  Recent Labs    01/10/19 0344 01/11/19 0417  NA 135 135  K 3.2* 3.5  CL 103 103  CO2 21* 20*  GLUCOSE 88 99  BUN 23 32*  CREATININE 7.60* 9.89*  CALCIUM 7.7* 8.1*   No results for input(s): PTH in the last 72 hours. Iron Studies: No results for input(s): IRON, TIBC, TRANSFERRIN, FERRITIN in the last 72 hours.  Studies/Results: Ir Fluoro Guide Cv Line Right  Result Date: 01/09/2019 INDICATION: Acute renal insufficiency. Please perform image guided placement of a temporary dialysis catheter for the initiation dialysis. EXAM: NON-TUNNELED CENTRAL VENOUS HEMODIALYSIS CATHETER PLACEMENT WITH ULTRASOUND AND FLUOROSCOPIC GUIDANCE COMPARISON:  Chest CT-01/06/2019 MEDICATIONS: None FLUOROSCOPY TIME:  12 seconds (1 mGy) COMPLICATIONS: None immediate. PROCEDURE: Informed written consent was obtained from the patient after a discussion of the risks, benefits, and alternatives to treatment. Questions regarding the procedure were  encouraged and answered. The right neck and chest were prepped with chlorhexidine in a sterile fashion, and a sterile drape was applied covering the operative field. Maximum barrier sterile technique with sterile gowns and gloves were used for the procedure. A timeout was performed prior to the initiation of the procedure. After the overlying soft tissues were anesthetized, a small venotomy incision was created and a micropuncture kit was utilized to access the internal jugular vein. Real-time ultrasound guidance was utilized for vascular access including the acquisition of a permanent ultrasound image documenting patency of the accessed vessel. The microwire was utilized to measure appropriate catheter length. A stiff glidewire was advanced to the level of the IVC. Under fluoroscopic guidance, the venotomy was serially dilated, ultimately allowing placement of a 20 cm temporary Trialysis catheter with tip ultimately terminating within the superior aspect of the right atrium. Final catheter positioning was confirmed and documented with a spot radiographic image. The catheter aspirates and flushes normally. The catheter was flushed with appropriate volume heparin dwells. The catheter exit site was secured with a 0-Prolene retention suture. A dressing was placed. The patient tolerated the procedure well without immediate post procedural complication. IMPRESSION: Successful placement of a right internal jugular approach 20 cm temporary dialysis catheter with tip terminating with in the superior aspect of the right atrium. The catheter is ready for immediate use. PLAN: This catheter may be converted to a tunneled dialysis catheter at a later date as indicated. Electronically Signed   By: Sandi Mariscal M.D.   On: 01/09/2019 11:23   Ir US Guide Vasc Access Right  Result Date: 01/09/2019 INDICATION: Acute  renal insufficiency. Please perform image guided placement of a temporary dialysis catheter for the initiation  dialysis. EXAM: NON-TUNNELED CENTRAL VENOUS HEMODIALYSIS CATHETER PLACEMENT WITH ULTRASOUND AND FLUOROSCOPIC GUIDANCE COMPARISON:  Chest CT-01/06/2019 MEDICATIONS: None FLUOROSCOPY TIME:  12 seconds (1 mGy) COMPLICATIONS: None immediate. PROCEDURE: Informed written consent was obtained from the patient after a discussion of the risks, benefits, and alternatives to treatment. Questions regarding the procedure were encouraged and answered. The right neck and chest were prepped with chlorhexidine in a sterile fashion, and a sterile drape was applied covering the operative field. Maximum barrier sterile technique with sterile gowns and gloves were used for the procedure. A timeout was performed prior to the initiation of the procedure. After the overlying soft tissues were anesthetized, a small venotomy incision was created and a micropuncture kit was utilized to access the internal jugular vein. Real-time ultrasound guidance was utilized for vascular access including the acquisition of a permanent ultrasound image documenting patency of the accessed vessel. The microwire was utilized to measure appropriate catheter length. A stiff glidewire was advanced to the level of the IVC. Under fluoroscopic guidance, the venotomy was serially dilated, ultimately allowing placement of a 20 cm temporary Trialysis catheter with tip ultimately terminating within the superior aspect of the right atrium. Final catheter positioning was confirmed and documented with a spot radiographic image. The catheter aspirates and flushes normally. The catheter was flushed with appropriate volume heparin dwells. The catheter exit site was secured with a 0-Prolene retention suture. A dressing was placed. The patient tolerated the procedure well without immediate post procedural complication. IMPRESSION: Successful placement of a right internal jugular approach 20 cm temporary dialysis catheter with tip terminating with in the superior aspect of the  right atrium. The catheter is ready for immediate use. PLAN: This catheter may be converted to a tunneled dialysis catheter at a later date as indicated. Electronically Signed   By: Sandi Mariscal M.D.   On: 01/09/2019 11:23    I have reviewed the patient's current medications.  Assessment/Plan: 1 AKI Contrast, ? HRS.  Some urine but not much, and no clearance 2 Acute liver injury improving, substance, ?? 3 Anemia stable. 4 Prostate Ca 5 BPH low PVR on scan P HD, follow urine , chem   LOS: 4 days   Jeneen Rinks Areen Trautner 01/11/2019,7:40 AM

## 2019-01-12 LAB — URINALYSIS, ROUTINE W REFLEX MICROSCOPIC
Bilirubin Urine: NEGATIVE
Glucose, UA: NEGATIVE mg/dL
Ketones, ur: NEGATIVE mg/dL
Nitrite: NEGATIVE
Protein, ur: 100 mg/dL — AB
RBC / HPF: >50 RBC/hpf — ABNORMAL HIGH (ref 0–5)
Specific Gravity, Urine: 1.009 (ref 1.005–1.030)
WBC, UA: >50 WBC/hpf — ABNORMAL HIGH (ref 0–5)
pH: 6 (ref 5.0–8.0)

## 2019-01-12 LAB — CBC WITH DIFFERENTIAL/PLATELET
Abs Immature Granulocytes: 0.09 10*3/uL — ABNORMAL HIGH (ref 0.00–0.07)
Basophils Absolute: 0 10*3/uL (ref 0.0–0.1)
Basophils Relative: 0 %
Eosinophils Absolute: 0.2 10*3/uL (ref 0.0–0.5)
Eosinophils Relative: 3 %
HCT: 32.6 % — ABNORMAL LOW (ref 39.0–52.0)
Hemoglobin: 10.1 g/dL — ABNORMAL LOW (ref 13.0–17.0)
Immature Granulocytes: 1 %
Lymphocytes Relative: 17 %
Lymphs Abs: 1.5 10*3/uL (ref 0.7–4.0)
MCH: 19.5 pg — ABNORMAL LOW (ref 26.0–34.0)
MCHC: 31 g/dL (ref 30.0–36.0)
MCV: 62.8 fL — ABNORMAL LOW (ref 80.0–100.0)
Monocytes Absolute: 1.1 10*3/uL — ABNORMAL HIGH (ref 0.1–1.0)
Monocytes Relative: 13 %
Neutro Abs: 5.6 10*3/uL (ref 1.7–7.7)
Neutrophils Relative %: 66 %
Platelets: 105 10*3/uL — ABNORMAL LOW (ref 150–400)
RBC: 5.19 MIL/uL (ref 4.22–5.81)
RDW: 17.1 % — ABNORMAL HIGH (ref 11.5–15.5)
WBC: 8.6 10*3/uL (ref 4.0–10.5)
nRBC: 0 % (ref 0.0–0.2)

## 2019-01-12 LAB — COMPREHENSIVE METABOLIC PANEL WITH GFR
ALT: 331 U/L — ABNORMAL HIGH (ref 0–44)
AST: 82 U/L — ABNORMAL HIGH (ref 15–41)
Albumin: 3.1 g/dL — ABNORMAL LOW (ref 3.5–5.0)
Alkaline Phosphatase: 97 U/L (ref 38–126)
Anion gap: 9 (ref 5–15)
BUN: 24 mg/dL — ABNORMAL HIGH (ref 8–23)
CO2: 24 mmol/L (ref 22–32)
Calcium: 8 mg/dL — ABNORMAL LOW (ref 8.9–10.3)
Chloride: 102 mmol/L (ref 98–111)
Creatinine, Ser: 8.49 mg/dL — ABNORMAL HIGH (ref 0.61–1.24)
GFR calc Af Amer: 7 mL/min — ABNORMAL LOW
GFR calc non Af Amer: 6 mL/min — ABNORMAL LOW
Glucose, Bld: 90 mg/dL (ref 70–99)
Potassium: 3.3 mmol/L — ABNORMAL LOW (ref 3.5–5.1)
Sodium: 135 mmol/L (ref 135–145)
Total Bilirubin: 1.4 mg/dL — ABNORMAL HIGH (ref 0.3–1.2)
Total Protein: 6.2 g/dL — ABNORMAL LOW (ref 6.5–8.1)

## 2019-01-12 LAB — GAMMA GT: GGT: 380 U/L — ABNORMAL HIGH (ref 7–50)

## 2019-01-12 LAB — PHOSPHORUS: Phosphorus: 3.2 mg/dL (ref 2.5–4.6)

## 2019-01-12 MED ORDER — ZOLPIDEM TARTRATE 5 MG PO TABS
5.0000 mg | ORAL_TABLET | Freq: Every evening | ORAL | Status: DC | PRN
  Administered 2019-01-13 – 2019-01-18 (×6): 5 mg via ORAL
  Filled 2019-01-12 (×6): qty 1

## 2019-01-12 MED ORDER — ZOLPIDEM TARTRATE 5 MG PO TABS
5.0000 mg | ORAL_TABLET | Freq: Once | ORAL | Status: AC
  Administered 2019-01-12: 5 mg via ORAL
  Filled 2019-01-12: qty 1

## 2019-01-12 NOTE — Progress Notes (Signed)
PROGRESS NOTE  Bradley Ray BDZ:[id number] DOB: 1956-04-03 DOA: 01/06/2019 PCP: Nolene Ebbs, MD  Brief History   Bradley Ray a 63 year old male, with past medical history significant for polysubstance use, hypertension and hyperlipidemia.  Patient was admitted with history of chest pain.  On presentation, patient was hypotensive, with associated shock liver and AKI.  AKI is likely multifactorial, considering hypotension, contrast dye exposure, liver injury with possible component of hepatorenal syndrome.  Patient's acute kidney injury is so severe that patient is currently on hemodialysis.  Shock liver is resolving.  Nephrology input is appreciated.  Further management will depend on hospital course.    Consultants  . Nephrology . Gastroenterology . Interventional Radiology  Procedures  . Temporary dialysis catheter placement.  Antibiotics  . None  Interval History/Subjective  See above for interval history.  No new complaints. No shortness of breath. No chest pain. Objective   Vitals:  Vitals:   01/11/19 1956 01/12/19 0453  BP: (!) 149/91 137/89  Pulse: 80 84  Resp: (!) 22 18  Temp: 99 F (37.2 C) 99.9 F (37.7 C)  SpO2: 96% 92%    Exam:  Constitutional:  . The patient is awake, alert, and oriented x 3.  Patient is not in any acute distress.   Respiratory:  . No increased work of breathing. . Clear to auscultation. Cardiovascular             S1-S2. Abdomen:  . Abdomen is soft, non-tender, non-distended. . No hernias, masses, or  . No HSM Extremities: No leg edema.   Neurologic:  Awake and alert.  Patient moves all extremities.     I have personally reviewed the following:   Today's Data  . CBC, CMP, Vitals  Scheduled Meds: . Chlorhexidine Gluconate Cloth  6 each Topical Q0600  . Chlorhexidine Gluconate Cloth  6 each Topical Q0600  . folic acid  1 mg Oral Daily  . heparin injection (subcutaneous)  5,000 Units Subcutaneous Q8H  . lidocaine  1  application Urethral Once  . multivitamin with minerals  1 tablet Oral Daily  . thiamine  100 mg Oral Daily   Or  . thiamine  100 mg Intravenous Daily   Continuous Infusions:  Principal Problem:   Shock liver Active Problems:   Hypertriglyceridemia   Chest pain   SIRS (systemic inflammatory response syndrome) (HCC)   AKI (acute kidney injury) ([place])   A & P  Acute kidney injury, severe, on renal replacement therapy:  -This likely multifactorial. 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 -Patient is currently on renal replacement therapy. -Guarded renal prognosis.  Likely shock liver:  This is resolving.   Continue to monitor closely.    Severe hypertriglyceridemia/HLD:  Alcohol cessation  Low-fat diet  Possible statin when liver function results.   Chest Pain:  Troponins flat.  EKG unchanged.  CTA negative for PE.  Echocardiogram demonstrates EF is 60%. Diastolic dysfunction.  01/12/2019: No further chest pain reported.  Alcohol abuse with dependence syndrome:  Continue thiamine.   Counseled to quit alcohol and illicit substances   Thrombocytopenia:  Resolving.   Platelet count is 105 today.  S  DVT prophylaxis: SCD's Code Status: Full Code Family Communication: None present. Disposition Plan: tbd.   Dana Allan, MD [facility] Direct contact: see [email or web address]  7PM-7AM contact night coverage as above 01/11/2019, 1:05 PM  LOS: 4 days    LOS: 5 days

## 2019-01-12 NOTE — Progress Notes (Signed)
Subjective: Interval History: has no complaint, no prob with HD yest.  Objective: Vital signs in last 24 hours: Temp:  [98.1 F (36.7 C)-99.9 F (37.7 C)] 99.9 F (37.7 C) (04/02 0453) Pulse Rate:  [76-92] 84 (04/02 0453) Resp:  [18-22] 18 (04/02 0453) BP: (137-168)/(89-103) 137/89 (04/02 0453) SpO2:  [92 %-98 %] 92 % (04/02 0453) Weight:  [82.6 kg-85.4 kg] 82.7 kg (04/02 0534) Weight change: 0.305 kg  Intake/Output from previous day: 04/01 0701 - 04/02 0700 In: 600 [P.O.:600] Out: 2701 [Urine:200] Intake/Output this shift: No intake/output data recorded.  General appearance: alert, cooperative and no distress Neck: IJ cath R Resp: clear to auscultation bilaterally Cardio: S1, S2 normal and systolic murmur: systolic ejection 2/6, crescendo and decrescendo at 2nd left intercostal space GI: soft, pos bs, liver down 5 cm Extremities: edema 1+  Lab Results: Recent Labs    01/11/19 0417 01/12/19 0135  WBC 8.6 8.6  HGB 10.2* 10.1*  HCT 31.7* 32.6*  PLT 95* 105*   BMET:  Recent Labs    01/11/19 0417 01/12/19 0135  NA 135 135  K 3.5 3.3*  CL 103 102  CO2 20* 24  GLUCOSE 99 90  BUN 32* 24*  CREATININE 9.89* 8.49*  CALCIUM 8.1* 8.0*   No results for input(s): PTH in the last 72 hours. Iron Studies: No results for input(s): IRON, TIBC, TRANSFERRIN, FERRITIN in the last 72 hours.  Studies/Results: No results found.  I have reviewed the patient's current medications.  Assessment/Plan: 1 AKI contrast, ? Some component HRs with liver injury 2 Anemia stable 3 Acute liver injury improving 4 Substance abuse P HD tomorrow if indic. Diet, follow chem, urine vol    LOS: 5 days   Jeneen Rinks Copper Kirtley 01/12/2019,7:32 AM

## 2019-01-13 LAB — COMPREHENSIVE METABOLIC PANEL
ALT: 226 U/L — ABNORMAL HIGH (ref 0–44)
AST: 44 U/L — ABNORMAL HIGH (ref 15–41)
Albumin: 3.1 g/dL — ABNORMAL LOW (ref 3.5–5.0)
Alkaline Phosphatase: 97 U/L (ref 38–126)
Anion gap: 15 (ref 5–15)
BUN: 35 mg/dL — ABNORMAL HIGH (ref 8–23)
CO2: 22 mmol/L (ref 22–32)
Calcium: 8 mg/dL — ABNORMAL LOW (ref 8.9–10.3)
Chloride: 99 mmol/L (ref 98–111)
Creatinine, Ser: 10.94 mg/dL — ABNORMAL HIGH (ref 0.61–1.24)
GFR calc Af Amer: 5 mL/min — ABNORMAL LOW (ref >60)
GFR calc non Af Amer: 4 mL/min — ABNORMAL LOW (ref >60)
Glucose, Bld: 85 mg/dL (ref 70–99)
Potassium: 3.5 mmol/L (ref 3.5–5.1)
Sodium: 136 mmol/L (ref 135–145)
Total Bilirubin: 0.9 mg/dL (ref 0.3–1.2)
Total Protein: 6 g/dL — ABNORMAL LOW (ref 6.5–8.1)

## 2019-01-13 LAB — PROTEIN ELECTROPHORESIS, SERUM
A/G Ratio: 0.8 (ref 0.7–1.7)
Albumin ELP: 2.8 g/dL — ABNORMAL LOW (ref 2.9–4.4)
Alpha-1-Globulin: 0.3 g/dL (ref 0.0–0.4)
Alpha-2-Globulin: 1.2 g/dL — ABNORMAL HIGH (ref 0.4–1.0)
Beta Globulin: 0.9 g/dL (ref 0.7–1.3)
Gamma Globulin: 0.9 g/dL (ref 0.4–1.8)
Globulin, Total: 3.3 g/dL (ref 2.2–3.9)
Total Protein ELP: 6.1 g/dL (ref 6.0–8.5)

## 2019-01-13 LAB — CBC
HCT: 31.5 % — ABNORMAL LOW (ref 39.0–52.0)
Hemoglobin: 10.2 g/dL — ABNORMAL LOW (ref 13.0–17.0)
MCH: 20.5 pg — ABNORMAL LOW (ref 26.0–34.0)
MCHC: 32.4 g/dL (ref 30.0–36.0)
MCV: 63.4 fL — ABNORMAL LOW (ref 80.0–100.0)
Platelets: 150 10*3/uL (ref 150–400)
RBC: 4.97 MIL/uL (ref 4.22–5.81)
RDW: 17.2 % — ABNORMAL HIGH (ref 11.5–15.5)
WBC: 8.4 10*3/uL (ref 4.0–10.5)
nRBC: 0 % (ref 0.0–0.2)

## 2019-01-13 LAB — ANCA TITERS
Atypical P-ANCA titer: <1:20 {titer}
C-ANCA: <1:20 {titer}
P-ANCA: <1:20 {titer}

## 2019-01-13 LAB — MPO/PR-3 (ANCA) ANTIBODIES
ANCA Proteinase 3: <3.5 U/mL (ref 0.0–3.5)
Myeloperoxidase Abs: <9 U/mL (ref 0.0–9.0)

## 2019-01-13 LAB — PHOSPHORUS: Phosphorus: 4.7 mg/dL — ABNORMAL HIGH (ref 2.5–4.6)

## 2019-01-13 MED ORDER — TRAMADOL HCL 50 MG PO TABS
50.0000 mg | ORAL_TABLET | Freq: Once | ORAL | Status: AC
  Administered 2019-01-13: 50 mg via ORAL
  Filled 2019-01-13: qty 1

## 2019-01-13 MED ORDER — HEPARIN SODIUM (PORCINE) 1000 UNIT/ML IJ SOLN
2.8000 mL | Freq: Once | INTRAMUSCULAR | Status: AC
  Administered 2019-01-13: 2800 [IU] via INTRAVENOUS

## 2019-01-13 MED ORDER — CHLORHEXIDINE GLUCONATE CLOTH 2 % EX PADS
6.0000 | MEDICATED_PAD | Freq: Every day | CUTANEOUS | Status: DC
  Administered 2019-01-14 – 2019-01-17 (×4): 6 via TOPICAL

## 2019-01-13 MED ORDER — HEPARIN SODIUM (PORCINE) 1000 UNIT/ML IJ SOLN
INTRAMUSCULAR | Status: AC
  Filled 2019-01-13: qty 3

## 2019-01-13 NOTE — Plan of Care (Signed)
  Problem: Activity: Goal: Risk for activity intolerance will decrease Outcome: Progressing   Problem: Pain Managment: Goal: General experience of comfort will improve Outcome: Progressing   Problem: Safety: Goal: Ability to remain free from injury will improve Outcome: Progressing   

## 2019-01-13 NOTE — Procedures (Signed)
I was present at this session.  I have reviewed the session itself and made appropriate changes.  HD via R IJ temp cath. bp 130s, tol 3.5 L off.   Jeneen Rinks Krosby Ritchie 4/3/20201:49 PM

## 2019-01-13 NOTE — Progress Notes (Signed)
PROGRESS NOTE    Bradley Ray  XNA:[id number] DOB: Jan 20, 1956 DOA: 01/06/2019 PCP: Nolene Ebbs, MD   Brief Narrative:63 year old male, with past medical history significant for polysubstance use, hypertension and hyperlipidemia.  Patient was admitted with history of chest pain.  On presentation, patient was hypotensive, with associated shock liver and AKI.  AKI is likely multifactorial, considering hypotension, contrast dye exposure, liver injury with possible component of hepatorenal syndrome.  Patient's acute kidney injury is so severe that patient is currently on hemodialysis.  Shock liver is resolving.  Nephrology input is appreciated.  Further management will depend on hospital course.    Assessment & Plan:   Principal Problem:   Shock liver Active Problems:   Hypertriglyceridemia   Chest pain   SIRS (systemic inflammatory response syndrome) (HCC)   AKI (acute kidney injury) ([place])   Acute kidney injury, severe, on renal replacement therapy:  -This likely multifactorial. 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 -Patient is currently on renal replacement therapy. -Guarded renal prognosis.  Likely shock liver:  This is resolving.   Continue to monitor closely.    Severe hypertriglyceridemia/HLD:  Alcohol cessation  Low-fat diet  Possible statin when liver function improves.  Chest Pain:  Troponins flat.  EKG unchanged.  CTA negative for PE.  Echocardiogram demonstrates EF is 60%. Diastolic dysfunction.  01/12/2019: No further chest pain reported.  Alcohol abuse with dependence syndrome:  Continue thiamine.   Counseled to quit alcohol and illicit substances   Thrombocytopenia:  Improving platelet 150 today.  DVT prophylaxis: SCD's Code Status: Full Code Family Communication: None present. Disposition Plan: tbd.    Estimated body mass index is 30.45 kg/m as  calculated from the following:   Height as of this encounter: 5\' 5"  (1.651 m).   Weight as of this encounter: 83 kg.   Subjective: Sitting by the side of the bed getting ready to go for dialysis denies any complaints he feels well  Objective: Vitals:   01/12/19 1913 01/13/19 0000 01/13/19 0401 01/13/19 0503  BP: (!) 149/95 (!) 154/96 (!) 163/97   Pulse: 79 84 85   Resp: 20  20   Temp: 98.2 F (36.8 C)  98.6 F (37 C)   TempSrc: Oral  Oral   SpO2: 94%  97%   Weight:    83 kg  Height:        Intake/Output Summary (Last 24 hours) at 01/13/2019 0845 Last data filed at 01/13/2019 0600 Gross per 24 hour  Intake 340 ml  Output 375 ml  Net -35 ml   Filed Weights   01/11/19 1100 01/12/19 0534 01/13/19 0503  Weight: 82.6 kg 82.7 kg 83 kg    Examination:  General exam: Appears calm and comfortable  Respiratory system: Clear to auscultation. Respiratory effort normal. Cardiovascular system: S1 & S2 heard, RRR. No JVD, murmurs, rubs, gallops or clicks. No pedal edema. Gastrointestinal system: Abdomen is nondistended, soft and nontender. No organomegaly or masses felt. Normal bowel sounds heard. Central nervous system: Alert and oriented. No focal neurological deficits. Extremities: Symmetric 5 x 5 power. Skin: No rashes, lesions or ulcers Psychiatry: Judgement and insight appear normal. Mood & affect appropriate.     Data Reviewed: I have personally reviewed following labs and imaging studies  CBC: Recent Labs  Lab 01/08/19 0313 01/09/19 0429 01/10/19 0344 01/11/19 0417 01/12/19 0135 01/13/19 0400  WBC 6.9 5.9 7.4 8.6 8.6 8.4  NEUTROABS 5.3  3.8 5.6 6.1 5.6  --   HGB 11.3* 10.8* 10.4* 10.2* 10.1* 10.2*  HCT 35.1* 32.8* 31.2* 31.7* 32.6* 31.5*  MCV 63.2* 62.1* 61.9* 62.5* 62.8* 63.4*  PLT 132* 103* 92* 95* 105* 329   Basic Metabolic Panel: Recent Labs  Lab 01/07/19 0114  01/08/19 0858  01/09/19 0429 01/10/19 0344 01/11/19 0417 01/12/19 0135 01/13/19 0400  NA   --    < >  --    < > 135 135 135 135 136  K  --    < >  --    < > 3.5 3.2* 3.5 3.3* 3.5  CL  --    < >  --    < > 108 103 103 102 99  CO2  --    < >  --    < > 16* 21* 20* 24 22  GLUCOSE  --    < >  --    < > 76 88 99 90 85  BUN  --    < >  --    < > 28* 23 32* 24* 35*  CREATININE  --    < >  --    < > 7.75* 7.60* 9.89* 8.49* 10.94*  CALCIUM  --    < >  --    < > 7.6* 7.7* 8.1* 8.0* 8.0*  MG 2.3  --  1.7  --  1.7 1.7 1.8  --   --   PHOS 2.0*  --  3.2  --  4.0 3.6 4.0 3.2 4.7*   < > = values in this interval not displayed.   GFR: Estimated Creatinine Clearance: 7 mL/min (A) (by C-G formula based on SCr of 10.94 mg/dL (H)). Liver Function Tests: Recent Labs  Lab 01/09/19 0429 01/10/19 0344 01/11/19 0417 01/12/19 0135 01/13/19 0400  AST 727* 311* 143* 82* 44*  ALT 907* 611* 458* 331* 226*  ALKPHOS 145* 125 109 97 97  BILITOT 1.8* 1.7* 1.3* 1.4* 0.9  PROT 5.8* 5.6* 6.0* 6.2* 6.0*  ALBUMIN 2.9* 2.9* 2.9*  2.9* 3.1* 3.1*   Recent Labs  Lab 01/06/19 1903  LIPASE 21   Recent Labs  Lab 01/07/19 0555  AMMONIA RESULTS UNAVAILABLE DUE TO INTERFERING SUBSTANCE   Coagulation Profile: Recent Labs  Lab 01/06/19 2058 01/09/19 0429  INR 1.1 1.1   Cardiac Enzymes: Recent Labs  Lab 01/06/19 1903 01/06/19 2234 01/07/19 0114 01/07/19 0555 01/07/19 1240  CKTOTAL  --  248  --   --   --   TROPONINI <0.03  --  <0.03 0.03* 0.03*   BNP (last 3 results) No results for input(s): PROBNP in the last 8760 hours. HbA1C: No results for input(s): HGBA1C in the last 72 hours. CBG: Recent Labs  Lab 01/07/19 0618  GLUCAP 72   Lipid Profile: No results for input(s): CHOL, HDL, LDLCALC, TRIG, CHOLHDL, LDLDIRECT in the last 72 hours. Thyroid Function Tests: No results for input(s): TSH, T4TOTAL, FREET4, T3FREE, THYROIDAB in the last 72 hours. Anemia Panel: No results for input(s): VITAMINB12, FOLATE, FERRITIN, TIBC, IRON, RETICCTPCT in the last 72 hours. Sepsis Labs: Recent Labs   Lab 01/07/19 0114 01/07/19 0555  LATICACIDVEN 4.3* 2.2*    Recent Results (from the past 240 hour(s))  Culture, Urine     Status: None   Collection Time: 01/09/19  7:24 AM  Result Value Ref Range Status   Specimen Description URINE, CATHETERIZED  Final   Special Requests NONE  Final   Culture  Final    NO GROWTH Performed at [facility], [place] [address], [place], [place] [address]    Report Status 01/10/2019 FINAL  Final         Radiology Studies: No results found.      Scheduled Meds: . Chlorhexidine Gluconate Cloth  6 each Topical Q0600  . Chlorhexidine Gluconate Cloth  6 each Topical Q0600  . Chlorhexidine Gluconate Cloth  6 each Topical Q0600  . folic acid  1 mg Oral Daily  . heparin injection (subcutaneous)  5,000 Units Subcutaneous Q8H  . lidocaine  1 application Urethral Once  . multivitamin with minerals  1 tablet Oral Daily  . thiamine  100 mg Oral Daily   Or  . thiamine  100 mg Intravenous Daily   Continuous Infusions:   LOS: 6 days     Georgette Shell, MD [facility]  If 7PM-7AM, please contact night-coverage [email or web address] Password [email or web address] 01/13/2019, 8:45 AM

## 2019-01-13 NOTE — Progress Notes (Signed)
Subjective: Interval History: has no complaint, making more urine.  Objective: Vital signs in last 24 hours: Temp:  [98.2 F (36.8 C)-98.7 F (37.1 C)] 98.6 F (37 C) (04/03 0401) Pulse Rate:  [79-85] 85 (04/03 0401) Resp:  [17-20] 20 (04/03 0401) BP: (149-163)/(95-97) 163/97 (04/03 0401) SpO2:  [94 %-97 %] 97 % (04/03 0401) Weight:  [83 kg] 83 kg (04/03 0503) Weight change: -2.392 kg  Intake/Output from previous day: 04/02 0701 - 04/03 0700 In: 940 [P.O.:940] Out: 375 [Urine:375] Intake/Output this shift: No intake/output data recorded.  General appearance: alert, cooperative, no distress and mildly obese Neck: RIJ cath Resp: clear to auscultation bilaterally Cardio: S1, S2 normal and systolic murmur: holosystolic 2/6, blowing at apex GI: soft, non-tender; bowel sounds normal; no masses,  no organomegaly Extremities: edema 1+  Lab Results: Recent Labs    01/12/19 0135 01/13/19 0400  WBC 8.6 8.4  HGB 10.1* 10.2*  HCT 32.6* 31.5*  PLT 105* 150   BMET:  Recent Labs    01/12/19 0135 01/13/19 0400  NA 135 136  K 3.3* 3.5  CL 102 99  CO2 24 22  GLUCOSE 90 85  BUN 24* 35*  CREATININE 8.49* 10.94*  CALCIUM 8.0* 8.0*   No results for input(s): PTH in the last 72 hours. Iron Studies: No results for input(s): IRON, TIBC, TRANSFERRIN, FERRITIN in the last 72 hours.  Studies/Results: No results found.  I have reviewed the patient's current medications.  Assessment/Plan: 1 AKI making some urine, no clearance . Do HD. Contrast 2 Anemia stable 3 Acute liver injury ETOH vs other 4 Prostate Ca 5 Substance abuse P HD, follow urine , cont diet    LOS: 6 days   Jeneen Rinks Salvator Seppala 01/13/2019,8:26 AM

## 2019-01-14 ENCOUNTER — Inpatient Hospital Stay (HOSPITAL_COMMUNITY): Payer: Medicaid Other

## 2019-01-14 LAB — RENAL FUNCTION PANEL
Albumin: 3 g/dL — ABNORMAL LOW (ref 3.5–5.0)
Anion gap: 11 (ref 5–15)
BUN: 29 mg/dL — ABNORMAL HIGH (ref 8–23)
CO2: 26 mmol/L (ref 22–32)
Calcium: 8.3 mg/dL — ABNORMAL LOW (ref 8.9–10.3)
Chloride: 99 mmol/L (ref 98–111)
Creatinine, Ser: 9.13 mg/dL — ABNORMAL HIGH (ref 0.61–1.24)
GFR calc Af Amer: 6 mL/min — ABNORMAL LOW (ref >60)
GFR calc non Af Amer: 6 mL/min — ABNORMAL LOW (ref >60)
Glucose, Bld: 84 mg/dL (ref 70–99)
Phosphorus: 5.3 mg/dL — ABNORMAL HIGH (ref 2.5–4.6)
Potassium: 3.6 mmol/L (ref 3.5–5.1)
Sodium: 136 mmol/L (ref 135–145)

## 2019-01-14 MED ORDER — ONDANSETRON HCL 4 MG PO TABS: 4.0000 mg | ORAL_TABLET | Freq: Four times a day (QID) | ORAL | Status: DC | PRN

## 2019-01-14 MED ORDER — COLCHICINE 0.6 MG PO TABS
0.6000 mg | ORAL_TABLET | Freq: Once | ORAL | Status: AC
  Administered 2019-01-14: 0.6 mg via ORAL
  Filled 2019-01-14: qty 1

## 2019-01-14 MED ORDER — PREDNISONE 20 MG PO TABS
20.0000 mg | ORAL_TABLET | Freq: Every day | ORAL | Status: DC
  Administered 2019-01-14 – 2019-01-18 (×5): 20 mg via ORAL
  Filled 2019-01-14 (×5): qty 1

## 2019-01-14 NOTE — Plan of Care (Signed)
  Problem: Activity: Goal: Risk for activity intolerance will decrease 01/14/2019 0008 by Barton Dubois, RN Outcome: Progressing 01/13/2019 2340 by Barton Dubois, RN Outcome: Progressing   Problem: Pain Managment: Goal: General experience of comfort will improve Outcome: Progressing   Problem: Safety: Goal: Ability to remain free from injury will improve 01/14/2019 0008 by Barton Dubois, RN Outcome: Progressing 01/13/2019 2340 by Barton Dubois, RN Outcome: Progressing   Problem: Safety: Goal: Ability to remain free from injury will improve 01/14/2019 0008 by Barton Dubois, RN Outcome: Progressing 01/13/2019 2340 by Barton Dubois, RN Outcome: Progressing  Pt. C/o pain in right ankle. Told to call for assistance if needed when ambulating.

## 2019-01-14 NOTE — Progress Notes (Signed)
No c/o. Pt up ad lib to bathroom. Offers no c/o.

## 2019-01-14 NOTE — Progress Notes (Signed)
Subjective: Interval History: has no complaint, making more urine.  Objective: Vital signs in last 24 hours: Temp:  [98.2 F (36.8 C)-99.3 F (37.4 C)] 99.3 F (37.4 C) (04/04 0547) Pulse Rate:  [77-91] 91 (04/04 0547) Resp:  [18-21] 18 (04/04 0547) BP: (133-151)/(93-100) 133/96 (04/04 0547) SpO2:  [95 %-98 %] 96 % (04/04 0547) Weight:  [80.3 kg-83.3 kg] 80.7 kg (04/04 0547) Weight change: 0.292 kg  Intake/Output from previous day: 04/03 0701 - 04/04 0700 In: 680 [P.O.:680] Out: 600 [Urine:600] Intake/Output this shift: Total I/O In: 120 [P.O.:120] Out: -   General appearance: alert, cooperative and no distress Neck: RIJ temp cath Resp: clear to auscultation bilaterally Cardio: S1, S2 normal and systolic murmur: systolic ejection 2/6, crescendo and decrescendo at 2nd left intercostal space GI: soft, non-tender; bowel sounds normal; no masses,  no organomegaly Extremities: extremities normal, atraumatic, no cyanosis or edema  Lab Results: Recent Labs    01/12/19 0135 01/13/19 0400  WBC 8.6 8.4  HGB 10.1* 10.2*  HCT 32.6* 31.5*  PLT 105* 150   BMET:  Recent Labs    01/13/19 0400 01/14/19 0444  NA 136 136  K 3.5 3.6  CL 99 99  CO2 22 26  GLUCOSE 85 84  BUN 35* 29*  CREATININE 10.94* 9.13*  CALCIUM 8.0* 8.3*   No results for input(s): PTH in the last 72 hours. Iron Studies: No results for input(s): IRON, TIBC, TRANSFERRIN, FERRITIN in the last 72 hours.  Studies/Results: No results found.  I have reviewed the patient's current medications.  Assessment/Plan: 1 AKI contrast, ? Some role HRS, Hd yest did well. Solute,acid/base better. More urine vol, follow GFR 2 Anemia stable 3 ETOH  4 Substance 5 Prostate Ca  P follow chem, urine vol, diet   LOS: 7 days   Bradley Ray 01/14/2019,9:04 AM

## 2019-01-14 NOTE — Progress Notes (Addendum)
Pt does not have PIV. Pt stating he is going home. Pt only has IV medications for PRN's. Pt denies need for PRN's at this time. Informed pt RN will  Request PO medications from MD. Pt verbalizes understanding.   Paged MD via [place]. Awaiting reply/New orders.

## 2019-01-14 NOTE — Progress Notes (Signed)
Pt. C/o pain to R ankle. Pt. Stated " this has happened before and I had to get fluid removed". On call for [facility] paged to make aware. RN will continue to monitor. Chalisa Kobler, Katherine Roan

## 2019-01-14 NOTE — Progress Notes (Signed)
PROGRESS NOTE    Bradley Ray  NOB:[id number] DOB: 17-Apr-1956 DOA: 01/06/2019 PCP: Nolene Ebbs, MD  Brief Narrative::63 year old male, with past medical history significant forpolysubstance use, hypertensionandhyperlipidemia. Patient was admitted with history of chest pain. On presentation, patient was hypotensive, with associated shock liver and AKI. AKI is likely multifactorial, considering hypotension, contrast dye exposure, liver injury with possible component of hepatorenal syndrome. Patient's acute kidney injury is so severe that patient is currently on hemodialysis. Shock liver is resolving. Nephrology input is appreciated. Further management will depend on hospital course.   Assessment & Plan:   Principal Problem:   Shock liver Active Problems:   Hypertriglyceridemia   Chest pain   SIRS (systemic inflammatory response syndrome) (HCC)   AKI (acute kidney injury) ([place])   Acute kidney injury, severe, on renal replacement therapy:  -This likely multifactorial. 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 -Patient is currently on renal replacement therapy. -Guarded renal prognosis.  Likely shock liver:  This is resolving.  Continue to monitor closely.  Severe hypertriglyceridemia/HLD:  Alcohol cessation Low-fat diet  Possible statin when liver function improves.  Chest Pain: Troponins flat.  EKG unchanged.  CTA negative for PE.  Echocardiogram demonstrates EF is 60%. Diastolic dysfunction. No further chest pain reported.  Alcohol abuse with dependence syndrome:  Continue thiamine.   Thrombocytopenia:  Resolved   Right ankle pain patient with bony tenderness and decreased range of motion obtain stat x-ray to rule out fracture.  Most likely gout.    DVT prophylaxis:SCD's Code Status:Full Code Family Communication:None present. Disposition  Plan:tbd.    Estimated body mass index is 29.6 kg/m as calculated from the following:   Height as of this encounter: 5\' 5"  (1.651 m).   Weight as of this encounter: 80.7 kg.    Subjective: Patient complaining of severe pain in the right ankle  not able to put weight on the right ankle or walk.  Denies history of gout denies trauma denies fall  Objective: Vitals:   01/13/19 1514 01/13/19 1613 01/13/19 2027 01/14/19 0547  BP: (!) 150/97 (!) 151/97 (!) 145/93 (!) 133/96  Pulse: 77 79 83 91  Resp: 18  18 18   Temp: 98.7 F (37.1 C) 98.2 F (36.8 C) 98.3 F (36.8 C) 99.3 F (37.4 C)  TempSrc: Oral Oral Oral Oral  SpO2: 98% 98% 97% 96%  Weight: 80.3 kg   80.7 kg  Height:        Intake/Output Summary (Last 24 hours) at 01/14/2019 0933 Last data filed at 01/14/2019 0747 Gross per 24 hour  Intake 560 ml  Output 600 ml  Net -40 ml   Filed Weights   01/13/19 1200 01/13/19 1514 01/14/19 0547  Weight: 83.3 kg 80.3 kg 80.7 kg    Examination:  General exam: Appears calm and comfortable  Respiratory system: Clear to auscultation. Respiratory effort normal. Cardiovascular system: S1 & S2 heard, RRR. No JVD, murmurs, rubs, gallops or clicks. No pedal edema. Gastrointestinal system: Abdomen is nondistended, soft and nontender. No organomegaly or masses felt. Normal bowel sounds heard. Central nervous system: Alert and oriented. No focal neurological deficits. Extremities: Decreased range of motion in the right ankle secondary to pain  skin: No rashes, lesions or ulcers Psychiatry: Judgement and insight appear normal. Mood & affect appropriate.     Data Reviewed: I have personally reviewed following labs and imaging studies  CBC: Recent Labs  Lab 01/08/19 0313 01/09/19 0429  01/10/19 0344 01/11/19 0417 01/12/19 0135 01/13/19 0400  WBC 6.9 5.9 7.4 8.6 8.6 8.4  NEUTROABS 5.3 3.8 5.6 6.1 5.6  --   HGB 11.3* 10.8* 10.4* 10.2* 10.1* 10.2*  HCT 35.1* 32.8* 31.2* 31.7* 32.6*  31.5*  MCV 63.2* 62.1* 61.9* 62.5* 62.8* 63.4*  PLT 132* 103* 92* 95* 105* 944   Basic Metabolic Panel: Recent Labs  Lab 01/08/19 0858  01/09/19 0429 01/10/19 0344 01/11/19 0417 01/12/19 0135 01/13/19 0400 01/14/19 0444  NA  --    < > 135 135 135 135 136 136  K  --    < > 3.5 3.2* 3.5 3.3* 3.5 3.6  CL  --    < > 108 103 103 102 99 99  CO2  --    < > 16* 21* 20* 24 22 26   GLUCOSE  --    < > 76 88 99 90 85 84  BUN  --    < > 28* 23 32* 24* 35* 29*  CREATININE  --    < > 7.75* 7.60* 9.89* 8.49* 10.94* 9.13*  CALCIUM  --    < > 7.6* 7.7* 8.1* 8.0* 8.0* 8.3*  MG 1.7  --  1.7 1.7 1.8  --   --   --   PHOS 3.2  --  4.0 3.6 4.0 3.2 4.7* 5.3*   < > = values in this interval not displayed.   GFR: Estimated Creatinine Clearance: 8.3 mL/min (A) (by C-G formula based on SCr of 9.13 mg/dL (H)). Liver Function Tests: Recent Labs  Lab 01/09/19 0429 01/10/19 0344 01/11/19 0417 01/12/19 0135 01/13/19 0400 01/14/19 0444  AST 727* 311* 143* 82* 44*  --   ALT 907* 611* 458* 331* 226*  --   ALKPHOS 145* 125 109 97 97  --   BILITOT 1.8* 1.7* 1.3* 1.4* 0.9  --   PROT 5.8* 5.6* 6.0* 6.2* 6.0*  --   ALBUMIN 2.9* 2.9* 2.9*  2.9* 3.1* 3.1* 3.0*   No results for input(s): LIPASE, AMYLASE in the last 168 hours. No results for input(s): AMMONIA in the last 168 hours. Coagulation Profile: Recent Labs  Lab 01/09/19 0429  INR 1.1   Cardiac Enzymes: Recent Labs  Lab 01/07/19 1240  TROPONINI 0.03*   BNP (last 3 results) No results for input(s): PROBNP in the last 8760 hours. HbA1C: No results for input(s): HGBA1C in the last 72 hours. CBG: No results for input(s): GLUCAP in the last 168 hours. Lipid Profile: No results for input(s): CHOL, HDL, LDLCALC, TRIG, CHOLHDL, LDLDIRECT in the last 72 hours. Thyroid Function Tests: No results for input(s): TSH, T4TOTAL, FREET4, T3FREE, THYROIDAB in the last 72 hours. Anemia Panel: No results for input(s): VITAMINB12, FOLATE, FERRITIN, TIBC,  IRON, RETICCTPCT in the last 72 hours. Sepsis Labs: No results for input(s): PROCALCITON, LATICACIDVEN in the last 168 hours.  Recent Results (from the past 240 hour(s))  Culture, Urine     Status: None   Collection Time: 01/09/19  7:24 AM  Result Value Ref Range Status   Specimen Description URINE, CATHETERIZED  Final   Special Requests NONE  Final   Culture   Final    NO GROWTH Performed at [facility], [address] [place], [place] [address]    Report Status 01/10/2019 FINAL  Final         Radiology Studies: No results found.      Scheduled Meds: . Chlorhexidine Gluconate Cloth  6 each Topical Q0600  .  Chlorhexidine Gluconate Cloth  6 each Topical Q0600  . Chlorhexidine Gluconate Cloth  6 each Topical Q0600  . folic acid  1 mg Oral Daily  . heparin injection (subcutaneous)  5,000 Units Subcutaneous Q8H  . lidocaine  1 application Urethral Once  . multivitamin with minerals  1 tablet Oral Daily  . thiamine  100 mg Oral Daily   Continuous Infusions:   LOS: 7 days      Georgette Shell, MD [facility]  If 7PM-7AM, please contact night-coverage [email or web address] Password [facility] 01/14/2019, 9:33 AM

## 2019-01-15 LAB — RENAL FUNCTION PANEL
Albumin: 3 g/dL — ABNORMAL LOW (ref 3.5–5.0)
Anion gap: 14 (ref 5–15)
BUN: 44 mg/dL — ABNORMAL HIGH (ref 8–23)
CO2: 23 mmol/L (ref 22–32)
Calcium: 8.7 mg/dL — ABNORMAL LOW (ref 8.9–10.3)
Chloride: 97 mmol/L — ABNORMAL LOW (ref 98–111)
Creatinine, Ser: 9.42 mg/dL — ABNORMAL HIGH (ref 0.61–1.24)
GFR calc Af Amer: 6 mL/min — ABNORMAL LOW (ref >60)
GFR calc non Af Amer: 5 mL/min — ABNORMAL LOW (ref >60)
Glucose, Bld: 99 mg/dL (ref 70–99)
Phosphorus: 5.1 mg/dL — ABNORMAL HIGH (ref 2.5–4.6)
Potassium: 3.9 mmol/L (ref 3.5–5.1)
Sodium: 134 mmol/L — ABNORMAL LOW (ref 135–145)

## 2019-01-15 MED ORDER — COLCHICINE 0.6 MG PO TABS
0.3000 mg | ORAL_TABLET | ORAL | Status: DC
  Administered 2019-01-16: 0.3 mg via ORAL
  Filled 2019-01-15 (×2): qty 0.5

## 2019-01-15 NOTE — Progress Notes (Signed)
Subjective: Interval History: has no complaint, feels good, more urine.  Objective: Vital signs in last 24 hours: Temp:  [98.4 F (36.9 C)-98.6 F (37 C)] 98.6 F (37 C) (04/05 0433) Pulse Rate:  [80-84] 81 (04/05 0433) Resp:  [14-18] 18 (04/05 0433) BP: (144-161)/(93-97) 148/93 (04/05 0433) SpO2:  [94 %-99 %] 97 % (04/05 0433) Weight:  [81 kg] 81 kg (04/05 0433) Weight change: -2.333 kg  Intake/Output from previous day: 04/04 0701 - 04/05 0700 In: 960 [P.O.:960] Out: 1175 [Urine:1175] Intake/Output this shift: No intake/output data recorded.  General appearance: alert, cooperative and no distress Neck: IJ  HD cath Resp: clear to auscultation bilaterally Cardio: S1, S2 normal GI: soft, non-tender; bowel sounds normal; no masses,  no organomegaly Extremities: extremities normal, atraumatic, no cyanosis or edema  Lab Results: Recent Labs    01/13/19 0400  WBC 8.4  HGB 10.2*  HCT 31.5*  PLT 150   BMET:  Recent Labs    01/14/19 0444 01/15/19 0312  NA 136 134*  K 3.6 3.9  CL 99 97*  CO2 26 23  GLUCOSE 84 99  BUN 29* 44*  CREATININE 9.13* 9.42*  CALCIUM 8.3* 8.7*   No results for input(s): PTH in the last 72 hours. Iron Studies: No results for input(s): IRON, TIBC, TRANSFERRIN, FERRITIN in the last 72 hours.  Studies/Results: Dg Foot 2 Views Right  Result Date: 01/14/2019 CLINICAL DATA:  Pain involving the medial malleolus beginning yesterday. No recent injury. EXAM: RIGHT FOOT - 2 VIEW COMPARISON:  None. FINDINGS: No fracture or dislocation. Suspected small ankle joint effusion. Joint spaces are preserved. No significant hallux valgus deformity. No erosions. Distal vascular calcifications. Regional soft tissues appear otherwise normal. No radiopaque foreign body. IMPRESSION: Small ankle joint effusion. Otherwise, no explanation for patient's medial sided foot and ankle pain. Electronically Signed   By: Sandi Mariscal M.D.   On: 01/14/2019 10:08    I have reviewed  the patient's current medications.  Assessment/Plan: 1 AKI contrast, low bp,?HRS. Better urine vol and slower rise S Cr.  Vol/K ok. Mild acidemia.  2 Liver injury 3 Anemia stable.  4 Substance abuse. P Follow chem, urine,     LOS: 8 days   Jeneen Rinks Dax Murguia 01/15/2019,7:52 AM

## 2019-01-15 NOTE — Progress Notes (Signed)
Patient ambulated in the hall with this nurse from his room to [place] and up and down each hall on [place]. Patient tolerated well and was able to hold a conversation while walking. No dyspnea or shortness of breath.

## 2019-01-15 NOTE — Progress Notes (Signed)
PROGRESS NOTE    KAHNER YANIK  XTG:[id number] DOB: 08/09/1956 DOA: 01/06/2019 PCP: Nolene Ebbs, MD  Brief Narrative: 63 year old male, with past medical history significant forpolysubstance use, hypertensionandhyperlipidemia. Patient was admitted with history of chest pain. On presentation, patient was hypotensive, with associated shock liver and AKI. AKI is likely multifactorial, considering hypotension, contrast dye exposure, liver injury with possible component of hepatorenal syndrome. Patient's acute kidney injury is so severe that patient is currently on hemodialysis. Shock liver is resolving. Nephrology input is appreciated. Further management will depend on hospital course.  Assessment & Plan:   Principal Problem:   Shock liver Active Problems:   Hypertriglyceridemia   Chest pain   SIRS (systemic inflammatory response syndrome) (HCC)   AKI (acute kidney injury) ([place])  Acute kidney injury, severe, on renal replacement therapy:  -This likely multifactorial. 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 -Patient is currently on renal replacement therapy. -Guarded renal prognosis. -Patient is making some urine but creatinine is trending up  Likely shock liver:  This is resolving.  Continue to monitor closely.  Severe hypertriglyceridemia/HLD:  Alcohol cessation Low-fat diet  Possible statin when liver functionimproves.  Chest Pain: Troponins flat.  EKG unchanged.  CTA negative for PE.  Echocardiogram demonstrates EF is 60%. Diastolic dysfunction. No further chest pain reported.  Alcohol abuse with dependence syndrome:  Continue thiamine.   Thrombocytopenia: Resolved   Acute gout right ankle-patient is able to ambulate today after receiving colchicine and prednisone.  X-ray showed no evidence of acute fracture.  He received a dose of colchicine 0.6 mg  yesterday will adjust the dose of colchicine to 0.3 mgbiweekly for his creatinine clearance.  DVT prophylaxis:SCD's Code Status:Full Code Family Communication:None present. Disposition Plan:Pending clinical improvement and need clearance from nephrology for discharge    Estimated body mass index is 29.7 kg/m as calculated from the following:   Height as of this encounter: 5\' 5"  (1.651 m).   Weight as of this encounter: 81 kg.    Subjective: No new complaints able to walk put wait on right ankle  Objective: Vitals:   01/14/19 1143 01/14/19 1938 01/14/19 2354 01/15/19 0433  BP: (!) 147/96 (!) 144/93 (!) 161/97 (!) 148/93  Pulse: 80 84 80 81  Resp: 14 16  18   Temp: 98.4 F (36.9 C) 98.4 F (36.9 C)  98.6 F (37 C)  TempSrc: Oral Oral  Oral  SpO2: 94% 99%  97%  Weight:    81 kg  Height:        Intake/Output Summary (Last 24 hours) at 01/15/2019 0939 Last data filed at 01/15/2019 0600 Gross per 24 hour  Intake 840 ml  Output 1175 ml  Net -335 ml   Filed Weights   01/13/19 1514 01/14/19 0547 01/15/19 0433  Weight: 80.3 kg 80.7 kg 81 kg    Examination:  General exam: Appears calm and comfortable  Respiratory system: Clear to auscultation. Respiratory effort normal. Cardiovascular system: S1 & S2 heard, RRR. No JVD, murmurs, rubs, gallops or clicks. No pedal edema. Gastrointestinal system: Abdomen is nondistended, soft and nontender. No organomegaly or masses felt. Normal bowel sounds heard. Central nervous system: Alert and oriented. No focal neurological deficits. Extremities: Better range of motion in the right ankle Skin: No rashes, lesions or ulcers Psychiatry: Judgement and insight appear normal. Mood & affect appropriate.     Data Reviewed: I have personally reviewed following labs and imaging studies  CBC: Recent Labs  Lab 01/09/19 0429 01/10/19 0344 01/11/19 0417 01/12/19 0135 01/13/19 0400  WBC 5.9 7.4 8.6 8.6 8.4  NEUTROABS 3.8 5.6 6.1 5.6   --   HGB 10.8* 10.4* 10.2* 10.1* 10.2*  HCT 32.8* 31.2* 31.7* 32.6* 31.5*  MCV 62.1* 61.9* 62.5* 62.8* 63.4*  PLT 103* 92* 95* 105* 175   Basic Metabolic Panel: Recent Labs  Lab 01/09/19 0429 01/10/19 0344 01/11/19 0417 01/12/19 0135 01/13/19 0400 01/14/19 0444 01/15/19 0312  NA 135 135 135 135 136 136 134*  K 3.5 3.2* 3.5 3.3* 3.5 3.6 3.9  CL 108 103 103 102 99 99 97*  CO2 16* 21* 20* 24 22 26 23   GLUCOSE 76 88 99 90 85 84 99  BUN 28* 23 32* 24* 35* 29* 44*  CREATININE 7.75* 7.60* 9.89* 8.49* 10.94* 9.13* 9.42*  CALCIUM 7.6* 7.7* 8.1* 8.0* 8.0* 8.3* 8.7*  MG 1.7 1.7 1.8  --   --   --   --   PHOS 4.0 3.6 4.0 3.2 4.7* 5.3* 5.1*   GFR: Estimated Creatinine Clearance: 8.1 mL/min (A) (by C-G formula based on SCr of 9.42 mg/dL (H)). Liver Function Tests: Recent Labs  Lab 01/09/19 0429 01/10/19 0344 01/11/19 0417 01/12/19 0135 01/13/19 0400 01/14/19 0444 01/15/19 0312  AST 727* 311* 143* 82* 44*  --   --   ALT 907* 611* 458* 331* 226*  --   --   ALKPHOS 145* 125 109 97 97  --   --   BILITOT 1.8* 1.7* 1.3* 1.4* 0.9  --   --   PROT 5.8* 5.6* 6.0* 6.2* 6.0*  --   --   ALBUMIN 2.9* 2.9* 2.9*   2.9* 3.1* 3.1* 3.0* 3.0*   No results for input(s): LIPASE, AMYLASE in the last 168 hours. No results for input(s): AMMONIA in the last 168 hours. Coagulation Profile: Recent Labs  Lab 01/09/19 0429  INR 1.1   Cardiac Enzymes: No results for input(s): CKTOTAL, CKMB, CKMBINDEX, TROPONINI in the last 168 hours. BNP (last 3 results) No results for input(s): PROBNP in the last 8760 hours. HbA1C: No results for input(s): HGBA1C in the last 72 hours. CBG: No results for input(s): GLUCAP in the last 168 hours. Lipid Profile: No results for input(s): CHOL, HDL, LDLCALC, TRIG, CHOLHDL, LDLDIRECT in the last 72 hours. Thyroid Function Tests: No results for input(s): TSH, T4TOTAL, FREET4, T3FREE, THYROIDAB in the last 72 hours. Anemia Panel: No results for input(s): VITAMINB12,  FOLATE, FERRITIN, TIBC, IRON, RETICCTPCT in the last 72 hours. Sepsis Labs: No results for input(s): PROCALCITON, LATICACIDVEN in the last 168 hours.  Recent Results (from the past 240 hour(s))  Culture, Urine     Status: None   Collection Time: 01/09/19  7:24 AM  Result Value Ref Range Status   Specimen Description URINE, CATHETERIZED  Final   Special Requests NONE  Final   Culture   Final    NO GROWTH Performed at [facility], [address], [place], [place] [address]    Report Status 01/10/2019 FINAL  Final         Radiology Studies: Dg Foot 2 Views Right  Result Date: 01/14/2019 CLINICAL DATA:  Pain involving the medial malleolus beginning yesterday. No recent injury. EXAM: RIGHT FOOT - 2 VIEW COMPARISON:  None. FINDINGS: No fracture or dislocation. Suspected small ankle joint effusion. Joint spaces are preserved. No significant hallux valgus deformity. No erosions. Distal vascular calcifications. Regional soft tissues appear otherwise normal.  No radiopaque foreign body. IMPRESSION: Small ankle joint effusion. Otherwise, no explanation for patient's medial sided foot and ankle pain. Electronically Signed   By: Sandi Mariscal M.D.   On: 01/14/2019 10:08        Scheduled Meds:  Chlorhexidine Gluconate Cloth  6 each Topical Q7225   folic acid  1 mg Oral Daily   heparin injection (subcutaneous)  5,000 Units Subcutaneous Q8H   lidocaine  1 application Urethral Once   multivitamin with minerals  1 tablet Oral Daily   predniSONE  20 mg Oral Q breakfast   thiamine  100 mg Oral Daily   Continuous Infusions:   LOS: 8 days     Georgette Shell, MD [facility]  If 7PM-7AM, please contact night-coverage [email or web address] Password [facility] 01/15/2019, 9:39 AM

## 2019-01-15 NOTE — Progress Notes (Signed)
Pt had a run of  20bt NSVT , no s/s VS WNL, MD notified, will continue to monitor, Thanks Arvella Nigh RN.

## 2019-01-16 DIAGNOSIS — I5032 Chronic diastolic (congestive) heart failure: Secondary | ICD-10-CM

## 2019-01-16 DIAGNOSIS — F1910 Other psychoactive substance abuse, uncomplicated: Secondary | ICD-10-CM

## 2019-01-16 LAB — RENAL FUNCTION PANEL
Albumin: 3 g/dL — ABNORMAL LOW (ref 3.5–5.0)
Anion gap: 12 (ref 5–15)
BUN: 56 mg/dL — ABNORMAL HIGH (ref 8–23)
CO2: 24 mmol/L (ref 22–32)
Calcium: 8.9 mg/dL (ref 8.9–10.3)
Chloride: 101 mmol/L (ref 98–111)
Creatinine, Ser: 8.32 mg/dL — ABNORMAL HIGH (ref 0.61–1.24)
GFR calc Af Amer: 7 mL/min — ABNORMAL LOW (ref >60)
GFR calc non Af Amer: 6 mL/min — ABNORMAL LOW (ref >60)
Glucose, Bld: 115 mg/dL — ABNORMAL HIGH (ref 70–99)
Phosphorus: 6.2 mg/dL — ABNORMAL HIGH (ref 2.5–4.6)
Potassium: 3.6 mmol/L (ref 3.5–5.1)
Sodium: 137 mmol/L (ref 135–145)

## 2019-01-16 NOTE — Progress Notes (Signed)
  [facility] Progress Note    Assessment/ Plan:   1 AKI: baseline creatinine appears to be 0.8-1.1.  Acute oliguric kidney injury d/t contrast, low bp,?HRS. Urine output is picking up and he doesn't have volume overload.  Cr finally trending down.  I don't think he will need anymore dialysis during this hospitalization but will keep HD cath in for 1-2 more days.   2 Liver injury- better 3 Anemia stable.  4 Substance abuse. 5.  Acute gout- better with pred and colchicine. 6.  Dispo: anticipate d/c in 1-2 more days from renal perspective.  Subjective:    Cr finally trending down and urine output picking up.  Walking in the hall, appears like he feels quite well.   Objective:   BP (!) 146/86 (BP Location: Right Arm)   Pulse 74   Temp 98.4 F (36.9 C) (Oral)   Resp 18   Ht 5\' 5"  (1.651 m)   Wt 79.4 kg   SpO2 95%   BMI 29.13 kg/m   Intake/Output Summary (Last 24 hours) at 01/16/2019 1031 Last data filed at 01/16/2019 0809 Gross per 24 hour  Intake 580 ml  Output 1650 ml  Net -1070 ml   Weight change: -1.567 kg  Physical Exam: Gen: older gentleman, NAD, sitting up on side of bed CVS: RRR Resp: clear bilaterally no c/w/r Abd: soft nontender NABS Ext: trace LE edema ACCESS: R IJ nontunneled HD cath  Imaging: No results found.  Labs: BMET Recent Labs  Lab 01/10/19 0344 01/11/19 0417 01/12/19 0135 01/13/19 0400 01/14/19 0444 01/15/19 0312 01/16/19 0356  NA 135 135 135 136 136 134* 137  K 3.2* 3.5 3.3* 3.5 3.6 3.9 3.6  CL 103 103 102 99 99 97* 101  CO2 21* 20* 24 22 26 23 24   GLUCOSE 88 99 90 85 84 99 115*  BUN 23 32* 24* 35* 29* 44* 56*  CREATININE 7.60* 9.89* 8.49* 10.94* 9.13* 9.42* 8.32*  CALCIUM 7.7* 8.1* 8.0* 8.0* 8.3* 8.7* 8.9  PHOS 3.6 4.0 3.2 4.7* 5.3* 5.1* 6.2*   CBC Recent Labs  Lab 01/10/19 0344 01/11/19 0417 01/12/19 0135 01/13/19 0400  WBC 7.4 8.6 8.6 8.4  NEUTROABS 5.6 6.1 5.6  --   HGB 10.4* 10.2* 10.1* 10.2*  HCT 31.2*  31.7* 32.6* 31.5*  MCV 61.9* 62.5* 62.8* 63.4*  PLT 92* 95* 105* 150    Medications:    . Chlorhexidine Gluconate Cloth  6 each Topical Q0600  . colchicine  0.3 mg Oral Once per day on Mon Thu  . folic acid  1 mg Oral Daily  . heparin injection (subcutaneous)  5,000 Units Subcutaneous Q8H  . lidocaine  1 application Urethral Once  . multivitamin with minerals  1 tablet Oral Daily  . predniSONE  20 mg Oral Q breakfast  . thiamine  100 mg Oral Daily      Madelon Lips MD [facility] pgr [phone] 01/16/2019, 10:31 AM

## 2019-01-16 NOTE — Progress Notes (Signed)
PROGRESS NOTE  Bradley Ray NOM:[id number] DOB: 03/11/1956 DOA: 01/06/2019 PCP: Nolene Ebbs, MD  HPI/Recap of past 89 hours: 63 year old male, with past medical history significant forpolysubstance use, hypertensionandhyperlipidemia. Patient was admitted on 3/27 with history of chest pain. On presentation, patient was hypotensive, with associated shock liver and AKI. AKI is likely multifactorial, considering hypotension, contrast dye exposure, liver injury with possible component of hepatorenal syndrome. Patient's acute kidney injury is so severe that patient had dialysis catheter placed and was started on hemodialysis.  Patient continues to improve slowly.  His last dialysis session was on Friday 4/3.  He has not needed dialysis since.  Today, his creatinine has finally started to trend downward, and ideal sign that his kidneys may return to normal function.  Patient was able to continue to make urine and voided over 700 cc of urine today.  Assessment/Plan: Principal Problem:   Shock liver: Resolving.  Resume statin when liver function improves.  Recheck transaminases tomorrow Active Problems:   Hypertriglyceridemia   Chest pain: Negative troponins and EKG unchanged.  CTA negative for PE.  Resolved by following morning.  Polysubstance abuse including alcohol: Monitor for withdrawal.  Continue thiamine.  Stable.  Diastolic dysfunction: Incidentally noted on echocardiogram: Looks to be euvolemic, continue to monitor.    AKI (acute kidney injury) ([place]): Severe, on hemodialysis.  Multifactorial felt to be secondary to hypotensive plus exposed to contrast dye.  Continues to improve.  For now, will leave in renal catheter and continue to observe his renal function.  No dialysis now x4 days  Thrombocytopenia: Resolved.  Acute gout: Patient had some difficulties ambulating and complaining of severe pain of the right ankle.[place] showed no evidence of acute fracture.  He has been on  renally adjusted colchicine and some prednisone.  Much improved.   Code Status: Full code   Family Communication: NO family present, declined for me to call anyone   Disposition Plan: Home hopefully in the next few days once renal function better improved   Consultants:  Nephrology  Procedures:  Dialysis catheter placed 3/28.  Repeated hemodialysis sessions during hospitalization  Antimicrobials:  None  DVT prophylaxis: SCDs   Objective: Vitals:   01/16/19 0533 01/16/19 1124  BP: (!) 146/86 (!) 144/87  Pulse: 74 76  Resp: 18 20  Temp: 98.4 F (36.9 C) 98.3 F (36.8 C)  SpO2: 95% 96%    Intake/Output Summary (Last 24 hours) at 01/16/2019 1204 Last data filed at 01/16/2019 1201 Gross per 24 hour  Intake 580 ml  Output 2550 ml  Net -1970 ml   Filed Weights   01/14/19 0547 01/15/19 0433 01/16/19 0533  Weight: 80.7 kg 81 kg 79.4 kg   Body mass index is 29.13 kg/m.  Exam:   General: Alert and oriented x3, no acute distress  HEENT: Normocephalic and atraumatic, mucous membranes are moist  Neck: Supple, no JVD  Cardiovascular: Regular rate and rhythm, S1-S2  Respiratory: Clear to auscultation bilaterally  Abdomen: Soft, nontender, nondistended, positive bowel sounds  Musculoskeletal: No clubbing or cyanosis or edema  Skin: No skin breaks, tears or lesions  Psychiatry: Appropriate, no evidence of psychoses  Neuro: No focal deficits   Data Reviewed: CBC: Recent Labs  Lab 01/10/19 0344 01/11/19 0417 01/12/19 0135 01/13/19 0400  WBC 7.4 8.6 8.6 8.4  NEUTROABS 5.6 6.1 5.6  --   HGB 10.4* 10.2* 10.1* 10.2*  HCT 31.2* 31.7* 32.6* 31.5*  MCV 61.9* 62.5* 62.8* 63.4*  PLT 92* 95* 105*  852   Basic Metabolic Panel: Recent Labs  Lab 01/10/19 0344 01/11/19 0417 01/12/19 0135 01/13/19 0400 01/14/19 0444 01/15/19 0312 01/16/19 0356  NA 135 135 135 136 136 134* 137  K 3.2* 3.5 3.3* 3.5 3.6 3.9 3.6  CL 103 103 102 99 99 97* 101  CO2 21* 20*  24 22 26 23 24   GLUCOSE 88 99 90 85 84 99 115*  BUN 23 32* 24* 35* 29* 44* 56*  CREATININE 7.60* 9.89* 8.49* 10.94* 9.13* 9.42* 8.32*  CALCIUM 7.7* 8.1* 8.0* 8.0* 8.3* 8.7* 8.9  MG 1.7 1.8  --   --   --   --   --   PHOS 3.6 4.0 3.2 4.7* 5.3* 5.1* 6.2*   GFR: Estimated Creatinine Clearance: 9.1 mL/min (A) (by C-G formula based on SCr of 8.32 mg/dL (H)). Liver Function Tests: Recent Labs  Lab 01/10/19 0344 01/11/19 0417 01/12/19 0135 01/13/19 0400 01/14/19 0444 01/15/19 0312 01/16/19 0356  AST 311* 143* 82* 44*  --   --   --   ALT 611* 458* 331* 226*  --   --   --   ALKPHOS 125 109 97 97  --   --   --   BILITOT 1.7* 1.3* 1.4* 0.9  --   --   --   PROT 5.6* 6.0* 6.2* 6.0*  --   --   --   ALBUMIN 2.9* 2.9*  2.9* 3.1* 3.1* 3.0* 3.0* 3.0*   No results for input(s): LIPASE, AMYLASE in the last 168 hours. No results for input(s): AMMONIA in the last 168 hours. Coagulation Profile: No results for input(s): INR, PROTIME in the last 168 hours. Cardiac Enzymes: No results for input(s): CKTOTAL, CKMB, CKMBINDEX, TROPONINI in the last 168 hours. BNP (last 3 results) No results for input(s): PROBNP in the last 8760 hours. HbA1C: No results for input(s): HGBA1C in the last 72 hours. CBG: No results for input(s): GLUCAP in the last 168 hours. Lipid Profile: No results for input(s): CHOL, HDL, LDLCALC, TRIG, CHOLHDL, LDLDIRECT in the last 72 hours. Thyroid Function Tests: No results for input(s): TSH, T4TOTAL, FREET4, T3FREE, THYROIDAB in the last 72 hours. Anemia Panel: No results for input(s): VITAMINB12, FOLATE, FERRITIN, TIBC, IRON, RETICCTPCT in the last 72 hours. Urine analysis:    Component Value Date/Time   COLORURINE AMBER (A) 01/12/2019 1528   APPEARANCEUR HAZY (A) 01/12/2019 1528   LABSPEC 1.009 01/12/2019 1528   PHURINE 6.0 01/12/2019 1528   GLUCOSEU NEGATIVE 01/12/2019 1528   HGBUR LARGE (A) 01/12/2019 1528   BILIRUBINUR NEGATIVE 01/12/2019 1528   KETONESUR NEGATIVE  01/12/2019 1528   PROTEINUR 100 (A) 01/12/2019 1528   NITRITE NEGATIVE 01/12/2019 1528   LEUKOCYTESUR LARGE (A) 01/12/2019 1528   Sepsis Labs: @LABRCNTIP (procalcitonin:4,lacticidven:4)  ) Recent Results (from the past 240 hour(s))  Culture, Urine     Status: None   Collection Time: 01/09/19  7:24 AM  Result Value Ref Range Status   Specimen Description URINE, CATHETERIZED  Final   Special Requests NONE  Final   Culture   Final    NO GROWTH Performed at [facility], [place] [address], [place], [place] [address]    Report Status 01/10/2019 FINAL  Final      Studies: No results found.  Scheduled Meds: . Chlorhexidine Gluconate Cloth  6 each Topical Q0600  . colchicine  0.3 mg Oral Once per day on Mon Thu  . folic acid  1 mg Oral Daily  . heparin  injection (subcutaneous)  5,000 Units Subcutaneous Q8H  . lidocaine  1 application Urethral Once  . multivitamin with minerals  1 tablet Oral Daily  . predniSONE  20 mg Oral Q breakfast  . thiamine  100 mg Oral Daily    Continuous Infusions:   LOS: 9 days     Annita Brod, MD [facility]  To reach me or the doctor on call, go to: [email or web address] Password [facility]  01/16/2019, 12:04 PM

## 2019-01-16 NOTE — Plan of Care (Signed)
  Problem: Education: Goal: Knowledge of General Education information will improve Description Including pain rating scale, medication(s)/side effects and non-pharmacologic comfort measures Outcome: Progressing   Problem: Clinical Measurements: Goal: Ability to maintain clinical measurements within normal limits will improve Outcome: Progressing   Problem: Activity: Goal: Risk for activity intolerance will decrease Outcome: Progressing   Problem: Nutrition: Goal: Adequate nutrition will be maintained Outcome: Progressing   Problem: Safety: Goal: Ability to remain free from injury will improve Outcome: Progressing   Problem: Pain Managment: Goal: General experience of comfort will improve Outcome: Progressing

## 2019-01-17 DIAGNOSIS — R945 Abnormal results of liver function studies: Secondary | ICD-10-CM

## 2019-01-17 DIAGNOSIS — F1010 Alcohol abuse, uncomplicated: Secondary | ICD-10-CM

## 2019-01-17 DIAGNOSIS — R300 Dysuria: Secondary | ICD-10-CM

## 2019-01-17 LAB — URINALYSIS, ROUTINE W REFLEX MICROSCOPIC
Bilirubin Urine: NEGATIVE
Glucose, UA: NEGATIVE mg/dL
Ketones, ur: NEGATIVE mg/dL
Nitrite: NEGATIVE
Protein, ur: NEGATIVE mg/dL
RBC / HPF: >50 RBC/hpf — ABNORMAL HIGH (ref 0–5)
Specific Gravity, Urine: 1.008 (ref 1.005–1.030)
pH: 6 (ref 5.0–8.0)

## 2019-01-17 LAB — RENAL FUNCTION PANEL
Albumin: 3.3 g/dL — ABNORMAL LOW (ref 3.5–5.0)
Anion gap: 15 (ref 5–15)
BUN: 64 mg/dL — ABNORMAL HIGH (ref 8–23)
CO2: 23 mmol/L (ref 22–32)
Calcium: 9.2 mg/dL (ref 8.9–10.3)
Chloride: 102 mmol/L (ref 98–111)
Creatinine, Ser: 6.06 mg/dL — ABNORMAL HIGH (ref 0.61–1.24)
GFR calc Af Amer: 11 mL/min — ABNORMAL LOW (ref >60)
GFR calc non Af Amer: 9 mL/min — ABNORMAL LOW (ref >60)
Glucose, Bld: 134 mg/dL — ABNORMAL HIGH (ref 70–99)
Phosphorus: 5.4 mg/dL — ABNORMAL HIGH (ref 2.5–4.6)
Potassium: 3.4 mmol/L — ABNORMAL LOW (ref 3.5–5.1)
Sodium: 140 mmol/L (ref 135–145)

## 2019-01-17 LAB — HEPATIC FUNCTION PANEL
ALT: 80 U/L — ABNORMAL HIGH (ref 0–44)
AST: 21 U/L (ref 15–41)
Albumin: 3.3 g/dL — ABNORMAL LOW (ref 3.5–5.0)
Alkaline Phosphatase: 75 U/L (ref 38–126)
Bilirubin, Direct: 0.1 mg/dL (ref 0.0–0.2)
Indirect Bilirubin: 0.5 mg/dL (ref 0.3–0.9)
Total Bilirubin: 0.6 mg/dL (ref 0.3–1.2)
Total Protein: 6.6 g/dL (ref 6.5–8.1)

## 2019-01-17 NOTE — Progress Notes (Signed)
  [facility] Progress Note    Assessment/ Plan:   1 AKI: baseline creatinine appears to be 0.8-1.1.  Acute oliguric kidney injury d/t contrast, low bp,?HRS. Urine output is picking up and he doesn't have volume overload.  Cr finally trending down.  Autodiuresing now.  Will remove HD cath today and Foley.  He will likely need OP follow up with [place] upon discharge which I will arrange.  I'd like to make sure he can keep up with oral intake given increasing UOP.   2 Liver injury- better 3 Anemia stable.  4 Substance abuse. 5.  Acute gout- better with pred and colchicine. 6.  Dispo: anticipate d/c tomorrow from renal perspective.  Subjective:    Cr down to 6.1, good UOP.  Autodiuresing.     Objective:   BP (!) 148/91 (BP Location: Right Arm)   Pulse 79   Temp 98.3 F (36.8 C) (Oral)   Resp 20   Ht 5\' 5"  (1.651 m)   Wt 79.6 kg Comment: b scale   SpO2 97%   BMI 29.19 kg/m   Intake/Output Summary (Last 24 hours) at 01/17/2019 1013 Last data filed at 01/17/2019 0950 Gross per 24 hour  Intake 1140 ml  Output 3650 ml  Net -2510 ml   Weight change: 0.161 kg  Physical Exam: Gen: older gentleman, NAD, sitting up on side of bed CVS: RRR Resp: clear bilaterally no c/w/r Abd: soft nontender NABS Ext: no LE edema ACCESS: R IJ nontunneled HD cath  Imaging: No results found.  Labs: BMET Recent Labs  Lab 01/11/19 0417 01/12/19 0135 01/13/19 0400 01/14/19 0444 01/15/19 0312 01/16/19 0356 01/17/19 0350  NA 135 135 136 136 134* 137 140  K 3.5 3.3* 3.5 3.6 3.9 3.6 3.4*  CL 103 102 99 99 97* 101 102  CO2 20* 24 22 26 23 24 23   GLUCOSE 99 90 85 84 99 115* 134*  BUN 32* 24* 35* 29* 44* 56* 64*  CREATININE 9.89* 8.49* 10.94* 9.13* 9.42* 8.32* 6.06*  CALCIUM 8.1* 8.0* 8.0* 8.3* 8.7* 8.9 9.2  PHOS 4.0 3.2 4.7* 5.3* 5.1* 6.2* 5.4*   CBC Recent Labs  Lab 01/11/19 0417 01/12/19 0135 01/13/19 0400  WBC 8.6 8.6 8.4  NEUTROABS 6.1 5.6  --   HGB 10.2* 10.1* 10.2*   HCT 31.7* 32.6* 31.5*  MCV 62.5* 62.8* 63.4*  PLT 95* 105* 150    Medications:    . Chlorhexidine Gluconate Cloth  6 each Topical Q0600  . colchicine  0.3 mg Oral Once per day on Mon Thu  . folic acid  1 mg Oral Daily  . heparin injection (subcutaneous)  5,000 Units Subcutaneous Q8H  . lidocaine  1 application Urethral Once  . multivitamin with minerals  1 tablet Oral Daily  . predniSONE  20 mg Oral Q breakfast  . thiamine  100 mg Oral Daily      Madelon Lips MD [facility] pgr [phone] 01/17/2019, 10:13 AM

## 2019-01-17 NOTE — Progress Notes (Signed)
Foley removed.

## 2019-01-17 NOTE — Plan of Care (Signed)
  Problem: Elimination: Goal: Will not experience complications related to urinary retention Outcome: Progressing   Problem: Pain Managment: Goal: General experience of comfort will improve Outcome: Progressing   

## 2019-01-17 NOTE — Progress Notes (Addendum)
PROGRESS NOTE  Bradley Ray ESP:[id number] DOB: 1955/11/13 DOA: 01/06/2019 PCP: Nolene Ebbs, MD  HPI/Recap of past 35 hours: 63 year old male, with past medical history significant forpolysubstance use, hypertensionandhyperlipidemia. Patient was admitted on 3/27 with history of chest pain. On presentation, patient was hypotensive, with associated shock liver and AKI. AKI is likely multifactorial, considering hypotension, contrast dye exposure, liver injury with possible component of hepatorenal syndrome. Patient's acute kidney injury is so severe that patient had dialysis catheter placed and was started on hemodialysis.  Patient continues to improve slowly.  His last dialysis session was on Friday 4/3.  He has not needed dialysis since.  Starting 4/6, creatinine has been trending downward.  Today down to 6.  Continues to void and is auto diuresing.  Nephrology giving okay to remove HD catheter and urinary catheter.  Patient doing okay although does complain of some dysuria  Assessment/Plan: Principal Problem:   Shock liver: Resolving.  Resume statin when liver function improves.  Transaminases almost fully normalized Active Problems:   Hypertriglyceridemia   Chest pain: Negative troponins and EKG unchanged.  CTA negative for PE.  Resolved by following morning.  Polysubstance abuse including alcohol: Monitor for withdrawal.  Continue thiamine.  Stable.  Diastolic dysfunction: Incidentally noted on echocardiogram: Looks to be euvolemic, continue to monitor.    AKI (acute kidney injury) ([place]): Severe, on hemodialysis.  Multifactorial felt to be secondary to hypotensive plus exposed to contrast dye.  Continues to improve and auto diurese.  HD and urinary catheters to be removed today.  Dysuria: Urinalysis negative  Thrombocytopenia: Resolved.  Acute gout: Patient had some difficulties ambulating and complaining of severe pain of the right ankle.[place] showed no evidence of acute  fracture.  He has been on renally adjusted colchicine and some prednisone.  Much improved.   Code Status: Full code   Family Communication: NO family present, declined for me to call anyone   Disposition Plan: Anticipate discharge home tomorrow   Consultants:  Nephrology  Procedures:  Dialysis catheter placed 3/28.  Repeated hemodialysis sessions during hospitalization  Antimicrobials:  None  DVT prophylaxis: SCDs   Objective: Vitals:   01/17/19 0358 01/17/19 1134  BP: (!) 148/91 (!) 145/97  Pulse: 79 75  Resp: 20 18  Temp: 98.3 F (36.8 C) 98.4 F (36.9 C)  SpO2: 97% 97%    Intake/Output Summary (Last 24 hours) at 01/17/2019 1304 Last data filed at 01/17/2019 1000 Gross per 24 hour  Intake 1140 ml  Output 3200 ml  Net -2060 ml   Filed Weights   01/15/19 0433 01/16/19 0533 01/17/19 0615  Weight: 81 kg 79.4 kg 79.6 kg   Body mass index is 29.19 kg/m.  Exam:   General: Alert and oriented x3, no acute distress  HEENT: Normocephalic and atraumatic, mucous membranes are moist  Neck: Supple, no JVD  Cardiovascular: Regular rate and rhythm, S1-S2  Respiratory: Clear to auscultation bilaterally  Abdomen: Soft, nontender, nondistended, positive bowel sounds  Musculoskeletal: No clubbing or cyanosis or edema  Skin: No skin breaks, tears or lesions  Psychiatry: Appropriate, no evidence of psychoses  Neuro: No focal deficits   Data Reviewed: CBC: Recent Labs  Lab 01/11/19 0417 01/12/19 0135 01/13/19 0400  WBC 8.6 8.6 8.4  NEUTROABS 6.1 5.6  --   HGB 10.2* 10.1* 10.2*  HCT 31.7* 32.6* 31.5*  MCV 62.5* 62.8* 63.4*  PLT 95* 105* 633   Basic Metabolic Panel: Recent Labs  Lab 01/11/19 0417  01/13/19 0400 01/14/19  1749 01/15/19 0312 01/16/19 0356 01/17/19 0350  NA 135   < > 136 136 134* 137 140  K 3.5   < > 3.5 3.6 3.9 3.6 3.4*  CL 103   < > 99 99 97* 101 102  CO2 20*   < > 22 26 23 24 23   GLUCOSE 99   < > 85 84 99 115* 134*  BUN 32*    < > 35* 29* 44* 56* 64*  CREATININE 9.89*   < > 10.94* 9.13* 9.42* 8.32* 6.06*  CALCIUM 8.1*   < > 8.0* 8.3* 8.7* 8.9 9.2  MG 1.8  --   --   --   --   --   --   PHOS 4.0   < > 4.7* 5.3* 5.1* 6.2* 5.4*   < > = values in this interval not displayed.   GFR: Estimated Creatinine Clearance: 12.4 mL/min (A) (by C-G formula based on SCr of 6.06 mg/dL (H)). Liver Function Tests: Recent Labs  Lab 01/11/19 0417 01/12/19 0135 01/13/19 0400 01/14/19 0444 01/15/19 0312 01/16/19 0356 01/17/19 0350  AST 143* 82* 44*  --   --   --  21  ALT 458* 331* 226*  --   --   --  80*  ALKPHOS 109 97 97  --   --   --  75  BILITOT 1.3* 1.4* 0.9  --   --   --  0.6  PROT 6.0* 6.2* 6.0*  --   --   --  6.6  ALBUMIN 2.9*  2.9* 3.1* 3.1* 3.0* 3.0* 3.0* 3.3*  3.3*   No results for input(s): LIPASE, AMYLASE in the last 168 hours. No results for input(s): AMMONIA in the last 168 hours. Coagulation Profile: No results for input(s): INR, PROTIME in the last 168 hours. Cardiac Enzymes: No results for input(s): CKTOTAL, CKMB, CKMBINDEX, TROPONINI in the last 168 hours. BNP (last 3 results) No results for input(s): PROBNP in the last 8760 hours. HbA1C: No results for input(s): HGBA1C in the last 72 hours. CBG: No results for input(s): GLUCAP in the last 168 hours. Lipid Profile: No results for input(s): CHOL, HDL, LDLCALC, TRIG, CHOLHDL, LDLDIRECT in the last 72 hours. Thyroid Function Tests: No results for input(s): TSH, T4TOTAL, FREET4, T3FREE, THYROIDAB in the last 72 hours. Anemia Panel: No results for input(s): VITAMINB12, FOLATE, FERRITIN, TIBC, IRON, RETICCTPCT in the last 72 hours. Urine analysis:    Component Value Date/Time   COLORURINE AMBER (A) 01/12/2019 1528   APPEARANCEUR HAZY (A) 01/12/2019 1528   LABSPEC 1.009 01/12/2019 1528   PHURINE 6.0 01/12/2019 1528   GLUCOSEU NEGATIVE 01/12/2019 1528   HGBUR LARGE (A) 01/12/2019 1528   BILIRUBINUR NEGATIVE 01/12/2019 1528   KETONESUR NEGATIVE  01/12/2019 1528   PROTEINUR 100 (A) 01/12/2019 1528   NITRITE NEGATIVE 01/12/2019 1528   LEUKOCYTESUR LARGE (A) 01/12/2019 1528   Sepsis Labs: @LABRCNTIP (procalcitonin:4,lacticidven:4)  ) Recent Results (from the past 240 hour(s))  Culture, Urine     Status: None   Collection Time: 01/09/19  7:24 AM  Result Value Ref Range Status   Specimen Description URINE, CATHETERIZED  Final   Special Requests NONE  Final   Culture   Final    NO GROWTH Performed at [facility], [place] [address] [place], [place] [address]    Report Status 01/10/2019 FINAL  Final      Studies: No results found.  Scheduled Meds: . Chlorhexidine Gluconate Cloth  6 each Topical Q0600  .  colchicine  0.3 mg Oral Once per day on Mon Thu  . folic acid  1 mg Oral Daily  . heparin injection (subcutaneous)  5,000 Units Subcutaneous Q8H  . lidocaine  1 application Urethral Once  . multivitamin with minerals  1 tablet Oral Daily  . predniSONE  20 mg Oral Q breakfast  . thiamine  100 mg Oral Daily    Continuous Infusions:   LOS: 10 days     Annita Brod, MD [facility]  To reach me or the doctor on call, go to: [email or web address] Password [email or web address]  01/17/2019, 1:04 PM

## 2019-01-18 LAB — RENAL FUNCTION PANEL
Albumin: 3.3 g/dL — ABNORMAL LOW (ref 3.5–5.0)
Anion gap: 11 (ref 5–15)
BUN: 63 mg/dL — ABNORMAL HIGH (ref 8–23)
CO2: 23 mmol/L (ref 22–32)
Calcium: 9.4 mg/dL (ref 8.9–10.3)
Chloride: 104 mmol/L (ref 98–111)
Creatinine, Ser: 3.57 mg/dL — ABNORMAL HIGH (ref 0.61–1.24)
GFR calc Af Amer: 20 mL/min — ABNORMAL LOW (ref >60)
GFR calc non Af Amer: 17 mL/min — ABNORMAL LOW (ref >60)
Glucose, Bld: 114 mg/dL — ABNORMAL HIGH (ref 70–99)
Phosphorus: 4.4 mg/dL (ref 2.5–4.6)
Potassium: 4.1 mmol/L (ref 3.5–5.1)
Sodium: 138 mmol/L (ref 135–145)

## 2019-01-18 MED ORDER — COLCHICINE 0.6 MG PO TABS: 0.3000 mg | ORAL_TABLET | ORAL | 0 refills | Status: DC

## 2019-01-18 MED ORDER — PREDNISONE 20 MG PO TABS: 20.0000 mg | ORAL_TABLET | Freq: Every day | ORAL | 0 refills | Status: AC

## 2019-01-18 MED ORDER — THIAMINE HCL 100 MG PO TABS: 100.0000 mg | ORAL_TABLET | Freq: Every day | ORAL | 0 refills | Status: AC

## 2019-01-18 MED ORDER — FOLIC ACID 1 MG PO TABS: 1.0000 mg | ORAL_TABLET | Freq: Every day | ORAL | 0 refills | Status: AC

## 2019-01-18 NOTE — Discharge Summary (Signed)
Physician Discharge Summary  DERAK SCHURMAN AGT:[id number] DOB: 04/08/1956 DOA: 01/06/2019  PCP: Nolene Ebbs, MD  Admit date: 01/06/2019 Discharge date: 01/18/2019  Admitted From: Home Disposition: Home  Recommendations for Outpatient Follow-up:  1. Follow up with PCP/nephro in 1-2 weeks 2. Please obtain BMP and Urinalysis in one week w PCP or Nephrology 3. Please follow up on w PCP re hematuria  Home Health:None  Equipment/Devices: none  Discharge Condition: Stable CODE STATUS: Full code Diet recommendation: 2 g sodium  Brief/Interim Summary: 63 y.o. male with medical history significant of polysubstance use, hypertension, hyperlipidemia, hx of BPH, hx of alcoholism, beta thalassemia trait, prostate cancer presenting to the hospital for evaluation of chest pain on 3/27 since around 4 PM 3/26. Denies any fevers or recent sick contacts.  States he was not having any nausea, vomiting, or abdominal pain at home but does report feeling tired lately.  States he drinks 2-3 shots of whiskey every day but yesterday drank more than usual because it was his friend's birthday. Work-up in the ER showed elevated LFTs AST/ALT 5000/2000, TG in 4875, AKI, hypokalemia, SIRS w tachycardia, tachypnea, lactic acidosis of 4.3. Patient patient was admitted. On admission was hypotensive with associated shock liver, subsequently had worsening renal function  He  Was seen by nephrology and was treated for shock liver, atypical chest pain with negative troponin and EKG, no PE in the CTA also for polysubstance abuse including alcohol use, diastolic dysfunction. He was seen by GI. lft has normalized. He had acute renal failure, baseline creatinine 0.8-1.1, oliguric kidney injury due to contrast, low BP, needed CRRT but resolved.  Creatinine has significantly improving, having adequate urine output.  Creatinine down to 3.5 from 6.0 today, seen by Dr. Eustace Pen from nephrology and okay to discharge home.  His Foley is out, and  HD catheter is out. Last HD was 4/3.  he will need follow-up chemistry panel in a week from PCP or nephrology, discussed with the patient. Other issues involves dysuria with negative UA, thrombocytopenia, resolved.  Acute gout flareup in the right ankle x-ray with no evidence of acute fracture was treated with renally adjusted colchicine and prednisone and now improved. He had Hematuria likely from traumatic Foley insertion, will follow up with urology/PCP.  Anemia of chronic disease stable. HTN: BP is well controlled, will continue to hold his HCTZ and amlodipine.  Follow-up with PCP to assess for need for his antihypertensive regimen after his bmp.  Discharge Diagnoses:  Principal Problem:   AKI (acute kidney injury) ([place]) Active Problems:   Substance abuse in remission [facility])   Hypertriglyceridemia   Chest pain   Chronic diastolic CHF (congestive heart failure) ([place])   Shock liver   Discharge Instructions  Discharge Instructions    Call MD for:  persistant nausea and vomiting   Complete by:  As directed    Call MD for:  severe uncontrolled pain   Complete by:  As directed    Call MD for:  temperature >100.4   Complete by:  As directed    Diet - low sodium heart healthy   Complete by:  As directed    Discharge instructions   Complete by:  As directed    Please follow-up with your primary care physician as scheduled on 4/13 and have your kidney function checked with chemistry panel and also urine test .   Increase activity slowly   Complete by:  As directed      Allergies as of 01/18/2019  Reactions   Morphine And Related Rash      Medication List    STOP taking these medications   amLODipine 10 MG tablet Commonly known as:  NORVASC   hydrochlorothiazide 12.5 MG capsule Commonly known as:  MICROZIDE   MUSCLE RUB EX     TAKE these medications   albuterol 108 (90 Base) MCG/ACT inhaler Commonly known as:  PROVENTIL HFA;VENTOLIN HFA Inhale 2 puffs into the lungs  every 6 (six) hours as needed for wheezing or shortness of breath.   aspirin EC 81 MG tablet Take 81 mg by mouth daily.   atorvastatin 40 MG tablet Commonly known as:  Lipitor Take 1 tablet (40 mg total) by mouth daily.   colchicine 0.6 MG tablet Take 0.5 tablets (0.3 mg total) by mouth 2 (two) times a week for 8 doses. Start taking on:  January 18, 8849   folic acid 1 MG tablet Commonly known as:  FOLVITE Take 1 tablet (1 mg total) by mouth daily. Start taking on:  January 19, 2019   predniSONE 20 MG tablet Commonly known as:  DELTASONE Take 1 tablet (20 mg total) by mouth daily with breakfast for 4 days. Start taking on:  January 19, 2019   thiamine 100 MG tablet Take 1 tablet (100 mg total) by mouth daily for 30 days. Start taking on:  January 19, 2019   traZODone 100 MG tablet Commonly known as:  DESYREL Take 50 mg by mouth at bedtime.      Follow-up Information    Nolene Ebbs, MD. Go on 01/23/2019.   Specialty:  Internal Medicine Why:  _0 :Max Sane information: [place] [phone]          Allergies  Allergen Reactions  . Morphine And Related Rash    Consultations:  Nephrology  Gastroenterology   Procedures/Studies: Dg Chest 2 View  Result Date: 01/06/2019 CLINICAL DATA:  Chest pain beginning 1715 hours.  Chest pressure. EXAM: CHEST - 2 VIEW COMPARISON:  08/21/2016 FINDINGS: Artifact overlies the chest. Heart size is normal. Mediastinal shadows are normal. The pulmonary vascularity is normal. The lungs are clear. No effusions. No acute bone finding. Relative chronic elevation of the right hemidiaphragm. IMPRESSION: No active cardiopulmonary disease. Electronically Signed   By: Nelson Chimes M.D.   On: 01/06/2019 19:51   US Renal  Result Date: 01/08/2019 CLINICAL DATA:  Acute renal insufficiency. EXAM: RENAL / URINARY TRACT ULTRASOUND COMPLETE COMPARISON:  Right upper quadrant ultrasound 01/07/2019. Abdominopelvic CT  11/26/2015. FINDINGS: Right Kidney: Renal measurements: 13.1 x 6.6 x 6.4 cm = volume: 289 mL. Mildly increased echogenicity. No hydronephrosis. Trace perinephric fluid. Left Kidney: Renal measurements: 12.9 x 6.6 x 7.3 cm = volume: 326 mL. Mildly increased echogenicity. No hydronephrosis. Trace perinephric fluid. Bladder: Decompressed. IMPRESSION: 1.  No hydronephrosis. 2. Increased renal echogenicity, suggesting medical renal disease. 3. Perinephric fluid is identified bilaterally. This is nonspecific but likely related to renal insufficiency. Electronically Signed   By: Abigail Miyamoto M.D.   On: 01/08/2019 10:18   Ir Fluoro Guide Cv Line Right  Result Date: 01/09/2019 INDICATION: Acute renal insufficiency. Please perform image guided placement of a temporary dialysis catheter for the initiation dialysis. EXAM: NON-TUNNELED CENTRAL VENOUS HEMODIALYSIS CATHETER PLACEMENT WITH ULTRASOUND AND FLUOROSCOPIC GUIDANCE COMPARISON:  Chest CT-01/06/2019 MEDICATIONS: None FLUOROSCOPY TIME:  12 seconds (1 mGy) COMPLICATIONS: None immediate. PROCEDURE: Informed written consent was obtained from the patient after a discussion of the risks, benefits, and alternatives to treatment. Questions regarding  the procedure were encouraged and answered. The right neck and chest were prepped with chlorhexidine in a sterile fashion, and a sterile drape was applied covering the operative field. Maximum barrier sterile technique with sterile gowns and gloves were used for the procedure. A timeout was performed prior to the initiation of the procedure. After the overlying soft tissues were anesthetized, a small venotomy incision was created and a micropuncture kit was utilized to access the internal jugular vein. Real-time ultrasound guidance was utilized for vascular access including the acquisition of a permanent ultrasound image documenting patency of the accessed vessel. The microwire was utilized to measure appropriate catheter length. A  stiff glidewire was advanced to the level of the IVC. Under fluoroscopic guidance, the venotomy was serially dilated, ultimately allowing placement of a 20 cm temporary Trialysis catheter with tip ultimately terminating within the superior aspect of the right atrium. Final catheter positioning was confirmed and documented with a spot radiographic image. The catheter aspirates and flushes normally. The catheter was flushed with appropriate volume heparin dwells. The catheter exit site was secured with a 0-Prolene retention suture. A dressing was placed. The patient tolerated the procedure well without immediate post procedural complication. IMPRESSION: Successful placement of a right internal jugular approach 20 cm temporary dialysis catheter with tip terminating with in the superior aspect of the right atrium. The catheter is ready for immediate use. PLAN: This catheter may be converted to a tunneled dialysis catheter at a later date as indicated. Electronically Signed   By: Sandi Mariscal M.D.   On: 01/09/2019 11:23   Ir US Guide Vasc Access Right  Result Date: 01/09/2019 INDICATION: Acute renal insufficiency. Please perform image guided placement of a temporary dialysis catheter for the initiation dialysis. EXAM: NON-TUNNELED CENTRAL VENOUS HEMODIALYSIS CATHETER PLACEMENT WITH ULTRASOUND AND FLUOROSCOPIC GUIDANCE COMPARISON:  Chest CT-01/06/2019 MEDICATIONS: None FLUOROSCOPY TIME:  12 seconds (1 mGy) COMPLICATIONS: None immediate. PROCEDURE: Informed written consent was obtained from the patient after a discussion of the risks, benefits, and alternatives to treatment. Questions regarding the procedure were encouraged and answered. The right neck and chest were prepped with chlorhexidine in a sterile fashion, and a sterile drape was applied covering the operative field. Maximum barrier sterile technique with sterile gowns and gloves were used for the procedure. A timeout was performed prior to the initiation of  the procedure. After the overlying soft tissues were anesthetized, a small venotomy incision was created and a micropuncture kit was utilized to access the internal jugular vein. Real-time ultrasound guidance was utilized for vascular access including the acquisition of a permanent ultrasound image documenting patency of the accessed vessel. The microwire was utilized to measure appropriate catheter length. A stiff glidewire was advanced to the level of the IVC. Under fluoroscopic guidance, the venotomy was serially dilated, ultimately allowing placement of a 20 cm temporary Trialysis catheter with tip ultimately terminating within the superior aspect of the right atrium. Final catheter positioning was confirmed and documented with a spot radiographic image. The catheter aspirates and flushes normally. The catheter was flushed with appropriate volume heparin dwells. The catheter exit site was secured with a 0-Prolene retention suture. A dressing was placed. The patient tolerated the procedure well without immediate post procedural complication. IMPRESSION: Successful placement of a right internal jugular approach 20 cm temporary dialysis catheter with tip terminating with in the superior aspect of the right atrium. The catheter is ready for immediate use. PLAN: This catheter may be converted to a tunneled dialysis catheter at a  later date as indicated. Electronically Signed   By: Sandi Mariscal M.D.   On: 01/09/2019 11:23   Dg Foot 2 Views Right  Result Date: 01/14/2019 CLINICAL DATA:  Pain involving the medial malleolus beginning yesterday. No recent injury. EXAM: RIGHT FOOT - 2 VIEW COMPARISON:  None. FINDINGS: No fracture or dislocation. Suspected small ankle joint effusion. Joint spaces are preserved. No significant hallux valgus deformity. No erosions. Distal vascular calcifications. Regional soft tissues appear otherwise normal. No radiopaque foreign body. IMPRESSION: Small ankle joint effusion. Otherwise, no  explanation for patient's medial sided foot and ankle pain. Electronically Signed   By: Sandi Mariscal M.D.   On: 01/14/2019 10:08   Ct Angio Chest/abd/pel For Dissection W And/or W/wo  Result Date: 01/06/2019 CLINICAL DATA:  Chest pain starting at 5:15 p.m. Chest pressure. Vomiting. Assessment for aortic dissection. EXAM: CT ANGIOGRAPHY CHEST, ABDOMEN AND PELVIS TECHNIQUE: Initial multi detector CT imaging of the chest was performed prior to contrast administration. Subsequently, multi detector CT imaging through the chest, abdomen and pelvis was performed using the standard protocol during bolus administration of intravenous contrast. Multiplanar reconstructed images and MIPs were obtained and reviewed to evaluate the vascular anatomy. CONTRAST:  100 cc Omnipaque 300 COMPARISON:  Multiple exams, including 11/26/2015 CT scan FINDINGS: Vascular opacification is suboptimal due to presumed delayed imaging. I reviewed the images and feel that these are adequate to assess for aortic dissection and do not require a repeat dose of contrast and repeat scan of the chest, abdomen, and pelvis, with such a repeat scan also not guaranteed to catch the contrast bolus perfectly. CTA CHEST FINDINGS Cardiovascular: On noncontrast images, there are no findings of acute intramural hematoma. Coronary, aortic arch, and branch vessel atherosclerotic vascular disease. Coronary artery involvement is primarily in the left anterior descending coronary artery. Mild cardiomegaly is present. There is heterogeneity in the pulmonary arterial tree but also in the pulmonary venous tree, and overall the do not feel that this exam is highly reliable in assessing for pulmonary embolus. It was not protocolled to assess for pulmonary embolus. No thoracic aortic or branch vessel dissection or acute vascular finding is noted. Mediastinum/Nodes: Subtle nonspecific stranding in the left lower neck potentially extending into the superior mediastinum,  questionable perivascular distribution. No obvious abscess or pneumomediastinum. No pathologic adenopathy observed. Lungs/Pleura: Mild dependent atelectasis in both lower lobes. Subsegmental atelectasis in the superior segments of both lower lobes. Musculoskeletal: Unremarkable Review of the MIP images confirms the above findings. CTA ABDOMEN AND PELVIS FINDINGS VASCULAR Aorta: Subtle periaortic/retroperitoneal stranding. Atherosclerotic calcification noted. No aneurysm or dissection is visible. No significant aortic stenosis. Celiac: Widely patent celiac tree. SMA: Widely patent. The SMA is thought to provide at least some of the right hepatic arterial supply. Renals: Patent and unremarkable. IMA: Patent. Inflow: Subtle perivascular stranding along the external iliac arteries and left common femoral artery. Atherosclerotic calcification of the common iliac and common femoral arteries without significant atherosclerotic narrowing. No dissection. Veins: Unremarkable Review of the MIP images confirms the above findings. NON-VASCULAR Hepatobiliary: Unremarkable Pancreas: Unremarkable Spleen: Unremarkable Adrenals/Urinary Tract: 1.7 by 1.3 cm left adrenal adenoma. The kidneys appear unremarkable. Stomach/Bowel: Unremarkable Lymphatic: No pathologic adenopathy is observed. Reproductive: Prostate brachytherapy seed implants noted. Other: No supplemental non-categorized findings. Musculoskeletal: Partial bony bridging at L5-S1 with facet and intervertebral spurring resulting in mild bilateral foraminal impingement. Lesser foraminal narrowing at L3-4 and L4-5. Review of the MIP images confirms the above findings. IMPRESSION: 1. No vascular dissection is identified. 2.  There is some unusual perivascular stranding along the abdominal aorta, left common femoral artery, and possibly along the left lower neck extending into the upper mediastinum. Although nonspecific, the possibility of large vessel vasculitis is not totally  excluded, and correlation with sedimentation rate or other clinical indicators of possible arteritis is suggested. No aneurysm is observed. 3. The contrast bolus was mildly delayed, suboptimal but adequate for assessing the systemic arteries/aorta. Today's exam is not considered highly sensitive in assessing the pulmonary arteries due to the delayed contrast phase. I did not repeat dose the patient given the mildly elevated creatinine and the additional radiation burden rescanning the entire chest, abdomen, and pelvis would incur. 4. Aortic Atherosclerosis (ICD10-I70.0). Coronary atherosclerosis especially in the left anterior descending coronary artery, with mild cardiomegaly. 5. Lower lumbar bony foraminal narrowing. Electronically Signed   By: Van Clines M.D.   On: 01/06/2019 21:58   US Abdomen Limited Ruq  Result Date: 01/07/2019 CLINICAL DATA:  Abnormal LFTs. EXAM: ULTRASOUND ABDOMEN LIMITED RIGHT UPPER QUADRANT COMPARISON:  CT yesterday. FINDINGS: Gallbladder: Physiologically distended. No gallstones or wall thickening visualized. 3 mm nonshadowing polyp in the mid gallbladder. No sonographic Murphy sign noted by sonographer. Common bile duct: Diameter: 2-3 mm, normal. Liver: No focal lesion identified. Within normal limits in parenchymal echogenicity. Portal vein is patent on color Doppler imaging with normal direction of blood flow towards the liver. Incidental note of increased echogenicity of the right kidney with trace perinephric fluid. IMPRESSION: 1. No explanation for elevated LFTs. 2. No gallstones. Tiny gallbladder polyp. Given size less than 6 mm, no further evaluation or follow-up recommended. Electronically Signed   By: Keith Rake M.D.   On: 01/07/2019 19:29    Subjective: Resting well.  Ambulating without much complaint.  Voiding well.  Is ready to go home today.  Discharge Exam: Vitals:   01/17/19 1944 01/18/19 0448  BP: (!) 141/87 140/87  Pulse: 74 70  Resp: 18 18   Temp: 98 F (36.7 C) 97.9 F (36.6 C)  SpO2: 100% 97%   Vitals:   01/17/19 0615 01/17/19 1134 01/17/19 1944 01/18/19 0448  BP:  (!) 145/97 (!) 141/87 140/87  Pulse:  75 74 70  Resp:  _0 Temp:  98.4 F (36.9 C) 98 F (36.7 C) 97.9 F (36.6 C)  TempSrc:  Oral Oral Oral  SpO2:  97% 100% 97%  Weight: 79.6 kg   78.3 kg  Height:        General: Pt is alert, awake, not in acute distress Cardiovascular: RRR, S1/S2 +, no rubs, no gallops Respiratory: CTA bilaterally, no wheezing, no rhonchi Abdominal: Soft, NT, ND, bowel sounds + Extremities: no edema, no cyanosis   The results of significant diagnostics from this hospitalization (including imaging, microbiology, ancillary and laboratory) are listed below for reference.     Microbiology: Recent Results (from the past 240 hour(s))  Culture, Urine     Status: None   Collection Time: 01/09/19  7:24 AM  Result Value Ref Range Status   Specimen Description URINE, CATHETERIZED  Final   Special Requests NONE  Final   Culture   Final    NO GROWTH Performed at [facility], [address], [place], [place] [address]    Report Status 01/10/2019 FINAL  Final     Labs: BNP (last 3 results) No results for input(s): BNP in the last 8760 hours. Basic Metabolic Panel: Recent Labs  Lab 01/14/19 0444 01/15/19 0312 01/16/19 0356 01/17/19  0350 01/18/19 0416  NA 136 134* 137 140 138  K 3.6 3.9 3.6 3.4* 4.1  CL 99 97* 101 102 104  CO2 _0 GLUCOSE 84 99 115* 134* 114*  BUN 29* 44* 56* 64* 63*  CREATININE 9.13* 9.42* 8.32* 6.06* 3.57*  CALCIUM 8.3* 8.7* 8.9 9.2 9.4  PHOS 5.3* 5.1* 6.2* 5.4* 4.4   Liver Function Tests: Recent Labs  Lab 01/12/19 0135 01/13/19 0400 01/14/19 0444 01/15/19 0312 01/16/19 0356 01/17/19 0350 01/18/19 0416  AST 82* 44*  --   --   --  21  --   ALT 331* 226*  --   --   --  80*  --   ALKPHOS 97 97  --   --   --  75  --   BILITOT 1.4* 0.9  --   --   --  0.6  --   PROT 6.2*  6.0*  --   --   --  6.6  --   ALBUMIN 3.1* 3.1* 3.0* 3.0* 3.0* 3.3*  3.3* 3.3*   No results for input(s): LIPASE, AMYLASE in the last 168 hours. No results for input(s): AMMONIA in the last 168 hours. CBC: Recent Labs  Lab 01/12/19 0135 01/13/19 0400  WBC 8.6 8.4  NEUTROABS 5.6  --   HGB 10.1* 10.2*  HCT 32.6* 31.5*  MCV 62.8* 63.4*  PLT 105* 150   Cardiac Enzymes: No results for input(s): CKTOTAL, CKMB, CKMBINDEX, TROPONINI in the last 168 hours. BNP: Invalid input(s): POCBNP CBG: No results for input(s): GLUCAP in the last 168 hours. D-Dimer No results for input(s): DDIMER in the last 72 hours. Hgb A1c No results for input(s): HGBA1C in the last 72 hours. Lipid Profile No results for input(s): CHOL, HDL, LDLCALC, TRIG, CHOLHDL, LDLDIRECT in the last 72 hours. Thyroid function studies No results for input(s): TSH, T4TOTAL, T3FREE, THYROIDAB in the last 72 hours.  Invalid input(s): FREET3 Anemia work up No results for input(s): VITAMINB12, FOLATE, FERRITIN, TIBC, IRON, RETICCTPCT in the last 72 hours. Urinalysis    Component Value Date/Time   COLORURINE YELLOW 01/17/2019 1310   APPEARANCEUR CLEAR 01/17/2019 1310   LABSPEC 1.008 01/17/2019 1310   PHURINE 6.0 01/17/2019 1310   GLUCOSEU NEGATIVE 01/17/2019 1310   HGBUR MODERATE (A) 01/17/2019 1310   BILIRUBINUR NEGATIVE 01/17/2019 1310   KETONESUR NEGATIVE 01/17/2019 1310   PROTEINUR NEGATIVE 01/17/2019 1310   NITRITE NEGATIVE 01/17/2019 1310   LEUKOCYTESUR SMALL (A) 01/17/2019 1310   Sepsis Labs Invalid input(s): PROCALCITONIN,  WBC,  LACTICIDVEN Microbiology Recent Results (from the past 240 hour(s))  Culture, Urine     Status: None   Collection Time: 01/09/19  7:24 AM  Result Value Ref Range Status   Specimen Description URINE, CATHETERIZED  Final   Special Requests NONE  Final   Culture   Final    NO GROWTH Performed at [facility], [place] [address], [place], [place] [address]    Report Status  01/10/2019 FINAL  Final     Time coordinating discharge:  10mnutes  SIGNED:   RAntonieta Pert MD  [facility] 01/18/2019, 12:41 PM  If 7PM-7AM, please contact night-coverage [email or web address]

## 2019-01-18 NOTE — Progress Notes (Signed)
  [facility] Progress Note    Assessment/ Plan:   1 AKI: baseline creatinine appears to be 0.8-1.1.  Acute oliguric kidney injury d/t contrast, low BP. Required CRRT and now resolved.  Cr coming down and Foley and R nontunneled IJ removed.  Able to keep up with PO intake.  I expect Cr to return to baseline.  If not, can see [place] as OP.  He will need f/u with PCP 1 week for bloodwork and repeat UA 2.  Hematuria: likely d/t traumatic Foley, will need repeat UA with PCP  3 Liver injury- better 4 Anemia stable.  5 Substance abuse. 6 Acute gout- better with pred and colchicine. 7.  Dispo: Ok to go from renal perspective.    Subjective:    Foley and nontunneled HD cath removed. Cr down to 3.6.       Objective:   BP 140/87   Pulse 70   Temp 97.9 F (36.6 C) (Oral)   Resp 18   Ht 5\' 5"  (1.651 m)   Wt 78.3 kg   SpO2 97%   BMI 28.74 kg/m   Intake/Output Summary (Last 24 hours) at 01/18/2019 1111 Last data filed at 01/18/2019 8413 Gross per 24 hour  Intake 1320 ml  Output 2650 ml  Net -1330 ml   Weight change: -1.225 kg  Physical Exam: Gen: older gentleman, NAD, sitting up on side of bed CVS: RRR Resp: clear bilaterally no c/w/r Abd: soft nontender NABS Ext: no LE edema ACCESS: nontunneled HD cath removed.  Imaging: No results found.  Labs: BMET Recent Labs  Lab 01/12/19 0135 01/13/19 0400 01/14/19 0444 01/15/19 0312 01/16/19 0356 01/17/19 0350 01/18/19 0416  NA 135 136 136 134* 137 140 138  K 3.3* 3.5 3.6 3.9 3.6 3.4* 4.1  CL 102 99 99 97* 101 102 104  CO2 24 22 26 23 24 23 23   GLUCOSE 90 85 84 99 115* 134* 114*  BUN 24* 35* 29* 44* 56* 64* 63*  CREATININE 8.49* 10.94* 9.13* 9.42* 8.32* 6.06* 3.57*  CALCIUM 8.0* 8.0* 8.3* 8.7* 8.9 9.2 9.4  PHOS 3.2 4.7* 5.3* 5.1* 6.2* 5.4* 4.4   CBC Recent Labs  Lab 01/12/19 0135 01/13/19 0400  WBC 8.6 8.4  NEUTROABS 5.6  --   HGB 10.1* 10.2*  HCT 32.6* 31.5*  MCV 62.8* 63.4*  PLT 105* 150     Medications:    . colchicine  0.3 mg Oral Once per day on Mon Thu  . folic acid  1 mg Oral Daily  . heparin injection (subcutaneous)  5,000 Units Subcutaneous Q8H  . lidocaine  1 application Urethral Once  . multivitamin with minerals  1 tablet Oral Daily  . predniSONE  20 mg Oral Q breakfast  . thiamine  100 mg Oral Daily      Madelon Lips MD [facility] pgr [phone] 01/18/2019, 11:11 AM

## 2019-06-30 ENCOUNTER — Encounter (HOSPITAL_BASED_OUTPATIENT_CLINIC_OR_DEPARTMENT_OTHER): Payer: Self-pay | Admitting: *Deleted

## 2019-06-30 ENCOUNTER — Other Ambulatory Visit (HOSPITAL_COMMUNITY)
Admission: RE | Admit: 2019-06-30 | Discharge: 2019-06-30 | Disposition: A | Discharge status: Home / Self Care | Payer: Medicaid Other | Source: Ambulatory Visit | Attending: Urology | Admitting: Urology

## 2019-06-30 ENCOUNTER — Other Ambulatory Visit: Payer: Self-pay

## 2019-06-30 DIAGNOSIS — Z01812 Encounter for preprocedural laboratory examination: Secondary | ICD-10-CM | POA: Diagnosis present

## 2019-06-30 DIAGNOSIS — Z20828 Contact with and (suspected) exposure to other viral communicable diseases: Secondary | ICD-10-CM | POA: Diagnosis not present

## 2019-06-30 NOTE — Progress Notes (Addendum)
Spoke w/ pt via phone for pre-op interview.  Npo after mn. Arrive at 0700. Needs istat 8.  Current ekg in chart and epic.  Pt had covid test done today.  Will take lipitor am dos w/ sips of water.  Asked to bring rescue inhaler.   PCP -   dr Nolene Ebbs Cardiologist -  no  Chest x-ray - 01-06-2019 epic EKG -  01-06-2019 epic Stress Test -  no ECHO -  01-07-2019 epic Cardiac Cath -  no  Sleep Study -  no CPAP -   Fasting Blood Sugar -  n/a Checks Blood Sugar _____ times a day  Blood Thinner Instructions: no Aspirin Instructions: ASA mg, given instructions to stop on 06-28-2019 by dr Jeffie Pollock office Last Dose:  06-28-2019  Anesthesia review:   pt admitted 01-06-2019 chest pain, SIRS w/ tachycardia/ tachypnea, hypotensive associated with shock liver,  acute renal failure, and diastolic chf.  Pt denies cardiac s&s, sob, difficulty breathing, no peripheral swelling.  States followed by pcp.  Patient denies shortness of breath, fever, cough and chest pain at phone interview.   ADDENDUM:  Chart reviewed by anesthesia, Konrad Felix PA, ok to proceed.

## 2019-07-01 LAB — NOVEL CORONAVIRUS, NAA (HOSP ORDER, SEND-OUT TO REF LAB; TAT 18-24 HRS): SARS-CoV-2, NAA: NOT DETECTED

## 2019-07-03 ENCOUNTER — Other Ambulatory Visit: Payer: Self-pay | Admitting: Urology

## 2019-07-03 NOTE — H&P (Signed)
CC: I have prostate cancer.  HPI: Bradley Ray is a 63 year-old male established patient who is here evaluation for treatment of prostate cancer.  His prostate cancer was diagnosed 07/02/2016. His PSA at his time of diagnosis was 3.87. His most recent PSA is 0.115.   06/29/19: Bradley Ray returns today for voiding studies for his recent history of a reduced stream. He has a history of prostate cancer treated with seeds in 1/18 and then a TURP for BOO in 9/19. His PSA prior to this visit is down to 0.115 after rising to 0.66. He has no associated signs or symptoms. His PVR today is 69ml. He voided 80ml with a PF of 24ml/sec and a flat curve.   At the time of his biopsy the prostate was 44ml in size. He had 4 cores with 5% Gleason 6 with 2 on the left and 2 on the right without clustering.       AUA Symptom Score: Less than 20% of the time he has the sensation of not emptying his bladder completely when finished urinating. 50% of the time he has to urinate again fewer than two hours after he has finished urinating. Less than 50% of the time he has to start and stop again several times when he urinates. Less than 20% of the time he finds it difficult to postpone urination. Almost always he has a weak urinary stream. He never has to push or strain to begin urination. He has to get up to urinate 3 times from the time he goes to bed until the time he gets up in the morning.   Calculated AUA Symptom Score: 15    ALLERGIES: Morphine Sulfate - Skin Rash    MEDICATIONS: Aspirin 81 mg tablet,chewable 1 tablet PO Daily  Hydrochlorothiazide 12.5 mg capsule 1 capsule PO Daily  Atorvastatin Calcium 40 mg tablet 1 tablet PO Daily  Vitamin B12     GU PSH: Complex cystometrogram, w/ void pressure and urethral pressure profile studies, any technique - 2018 Complex Uroflow - 05/30/2018, 2018, 2018, 2017 Cystoscopy - 05/30/2018, 2018 Cystoscopy TURP - 06/16/2018 Emg surf Electrd - 2018 Intrabd voidng Press -  2018 Prostate Needle Biopsy - 2017 TRANSPERI NEEDLE PLACE, PROS - 2018       PSH Notes: left ankle surgery 6 screws 2 metal rods placed   NON-GU PSH: Surgical Pathology, Gross And Microscopic Examination For Prostate Needle - 2017 Total Knee Replacement, Right     GU PMH: Prostate Cancer, His PSA was back up slightly at his last visit. I will repeat that today. - 06/26/2019, - 03/29/2019, T1c Nx Mx Gleason 6 disease. , - 2017 Weak Urinary Stream (Worsening), His flow is markedly reduced and I am concerned about a stricture. I will have him return for a flowrate and cystoscopy. - 06/26/2019, - 05/30/2018 History of prostate cancer, His PSA is up some and he has some firmness at the left base. I am going to have him return in 6 months with a PSA. - 03/29/2019, His PSA continues to fall. , - 05/30/2018, - 04/19/2018 (Improving), His PSA has fallen nicely and I will get a repeat today and at f/u in 6 months. , - 2019 Prostate nodule w/o LUTS, He is voiding well s/p TURP. - 03/29/2019 Rising PSA after prostate cancer treatment (Worsening) - 03/29/2019 BPH w/o LUTS (Improving), He is doing very well post TURP. I will have him return in 6 months with a PSA for f/u of his prostate cancer  history. - 09/26/2018 Microscopic hematuria, He has minimal residual microhematuria that I will follow. - 09/26/2018 BPH w/LUTS, He didn't have much improvement with doubling the tamsulosin and has a ball valving middle lobe. I have discussed options and will get him set up for a TURP of the middle lobe. I reviewd the risks of a TURP including bleeding, infection, incontinence, stricture, need for secondary procedures, ejaculatory and erectile dysfunction, thrombotic events, fluid overload and anesthetic complications. I explained that 95% of men will have relief of the obstructive symptoms and about 70% will have relief of the irritative symptoms. - 05/30/2018 Dysuria - 05/30/2018 Straining on Urination - 04/19/2018 Urinary  Urgency - 04/19/2018 Nocturia - 2019 Hemorrhagic cystitis - 08/17/2017 Urinary Retention (Improving), Bradley Ray is voiding to completion but he appears to have a minimally symptomatic UTI today. I will send a culture and treat accordingly. - 2018, - 2018 (Stable), He had persistent obstruction on UDS on 4/6. I am going to get a culture today and will have him return in a week. He will remove the foley in the morning and f/u later in the day for a PVR. He will hold the oxybutynin prior to the foley removal. , - 2018 (Stable), He failed his voiding trial today and has a 3cm prostate with bilobar hyperplasia. His options are to replace the foley and give him a voiding trial in 3-4 weeks with urodynamics or consider a Urolift procedure. I have reviewed the risks of bleeding, infection, injury to adjacent structures, incontinence, strictures, encrustation, sexual/ejaculatory dysfunction, thrombotic events and anesthetic complications. He wanted to go ahead and schedule the Urolift but after review we found that it is not a covered benefit on Medicaid. I will put him on finasteride and reviewed the side effects. he will return for urodynamics in 4 weeks with OV to follow. . A foley was replaced today. , - 2018, - 2018 Urinary Tract Inf, Unspec site - 2018 Urinary Frequency - 2018 Elevated PSA - 2017, He has an elevated PSA with a benign exam. This was his first PSA. , - 2017 Bladder-neck stenosis/contracture, He has obstructive and irritative voiding symptoms. - 2017 Testicular atrophy, He has moderate left testicular atrophy. - 2017      PMH Notes: 3/20 Liver dysfunction and renal failure.    NON-GU PMH: Arthritis Encounter for general adult medical examination without abnormal findings, Encounter for preventive health examination Hypercholesterolemia Hypertension    FAMILY HISTORY: Death - Father Hypertension - Mother Pacemaker Placement - Mother   SOCIAL HISTORY: Marital Status: Single Preferred  Language: English; Ethnicity: Not Hispanic Or Latino; Race: Black or African American Current Smoking Status: Patient does not smoke anymore. Has not smoked since 05/12/2008.   Tobacco Use Assessment Completed: Used Tobacco in last 30 days? Has not drank since 12/11/2018.  Does not drink caffeine. Patient's occupation is/was disabled.    REVIEW OF SYSTEMS:    GU Review Male:   Patient denies frequent urination, hard to postpone urination, burning/ pain with urination, get up at night to urinate, leakage of urine, stream starts and stops, trouble starting your stream, have to strain to urinate , erection problems, and penile pain.  Gastrointestinal (Upper):   Patient denies nausea, vomiting, and indigestion/ heartburn.  Gastrointestinal (Lower):   Patient denies diarrhea and constipation.  Constitutional:   Patient denies fever, night sweats, weight loss, and fatigue.  Skin:   Patient denies skin rash/ lesion and itching.  Eyes:   Patient denies blurred vision and double vision.  Ears/ Nose/ Throat:   Patient denies sore throat and sinus problems.  Hematologic/Lymphatic:   Patient denies swollen glands and easy bruising.  Cardiovascular:   Patient denies chest pains and leg swelling.  Respiratory:   Patient denies cough and shortness of breath.  Endocrine:   Patient denies excessive thirst.  Musculoskeletal:   Patient denies back pain and joint pain.  Neurological:   Patient denies headaches and dizziness.  Psychologic:   Patient denies depression and anxiety.   VITAL SIGNS: None   MULTI-SYSTEM PHYSICAL EXAMINATION:    Constitutional: Well-nourished. No physical deformities. Normally developed. Good grooming.  Respiratory: Normal breath sounds. No labored breathing, no use of accessory muscles.   Cardiovascular: Regular rate and rhythm. No murmur, no gallop.      PAST DATA REVIEWED:  Source Of History:  Patient  Lab Test Review:   PSA  Urine Test Review:   Urinalysis  Urodynamics  Review:   Review Bladder Scan, Review Flow Rate   06/28/19 03/22/19 05/23/18 11/02/17 07/08/17 05/18/16 04/15/16 12/13/15  PSA  Total PSA 0.115 ng/mL 0.66 ng/mL 0.13 ng/mL 0.61 ng/mL 0.67 ng/mL 3.87  3.69 ng/dl 6.48 ng/dl  Free PSA       0.93 ng/dl   % Free PSA       25 %     04/15/16  Hormones  Testosterone, Total 274.9 pg/dL    PROCEDURES:         Flexible Cystoscopy - 52000  Risks, benefits, and some of the potential complications of the procedure were discussed. 32ml of 2% lidocaine jelly was instilled intraurethrally.  Cipro 500mg  given for antibiotic prophylaxis.     Meatus:  Normal size. Normal location. Normal condition.  Urethra:  No strictures.  External Sphincter:  Normal.  Verumontanum:  Normal.  Prostate:  resected with an exposed seed and radiation blanching.   Bladder Neck:  Severe bladder neck contracture. I couldn't see a lumen to pass a wire to dilate.  Ureteral Orifices:  not visualized.  Bladder:  Not visualized.       The procedure was well tolerated and there were no complications.        Flow Rate - 51741  Voided Volume: 78 cc  Time of Peak Flow: 0:07 min:sec  Flow Time: 0:20 min:sec  Total Void Time: 0:20 min:sec  Average Flow Rate: 3 cc/sec  Peak Flow Rate: 3 cc/sec         Urinalysis Dipstick Dipstick Cont'd  Color: Yellow Bilirubin: Neg mg/dL  Appearance: Clear Ketones: Neg mg/dL  Specific Gravity: 1.020 Blood: Neg ery/uL  pH: 6.0 Protein: Trace mg/dL  Glucose: Neg mg/dL Urobilinogen: 0.2 mg/dL    Nitrites: Neg    Leukocyte Esterase: Neg leu/uL    ASSESSMENT:      ICD-10 Details  1 GU:   History of prostate cancer - Z85.46 Improving - His PSA is down. He will need a repeat in 6 months.   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 I reviewed the risks of bleeding, infection, incontinence, recurrence, thrombotic events and anesthetic complications. I also  mentioned the small possibiity of needing an SP tube.    PLAN:           Schedule Return Visit/Planned Activity: ASAP - Schedule Surgery          Document Letter(s):  Created for Patient: Clinical Summary  Notes:   CC: Dr. Nolene Ebbs.         Next Appointment:      Next Appointment: 07/04/2019 09:00 AM    Appointment Type: Surgery     Location: [facility] [phone]    Provider: [name], M.D.    Reason for Visit: [place]

## 2019-07-04 ENCOUNTER — Encounter (HOSPITAL_BASED_OUTPATIENT_CLINIC_OR_DEPARTMENT_OTHER)
Admission: RE | Disposition: A | Discharge status: Home / Self Care | Payer: Self-pay | Source: Ambulatory Visit | Attending: Urology

## 2019-07-04 ENCOUNTER — Ambulatory Visit (HOSPITAL_BASED_OUTPATIENT_CLINIC_OR_DEPARTMENT_OTHER)
Admission: RE | Admit: 2019-07-04 | Discharge: 2019-07-04 | Disposition: A | Discharge status: Home / Self Care | Payer: Medicaid Other | Source: Ambulatory Visit | Attending: Urology | Admitting: Urology

## 2019-07-04 ENCOUNTER — Ambulatory Visit (HOSPITAL_BASED_OUTPATIENT_CLINIC_OR_DEPARTMENT_OTHER): Payer: Medicaid Other | Admitting: Physician Assistant

## 2019-07-04 ENCOUNTER — Encounter (HOSPITAL_BASED_OUTPATIENT_CLINIC_OR_DEPARTMENT_OTHER): Payer: Self-pay

## 2019-07-04 ENCOUNTER — Other Ambulatory Visit: Payer: Self-pay

## 2019-07-04 DIAGNOSIS — Z79899 Other long term (current) drug therapy: Secondary | ICD-10-CM | POA: Insufficient documentation

## 2019-07-04 DIAGNOSIS — N320 Bladder-neck obstruction: Secondary | ICD-10-CM | POA: Insufficient documentation

## 2019-07-04 DIAGNOSIS — J449 Chronic obstructive pulmonary disease, unspecified: Secondary | ICD-10-CM | POA: Diagnosis not present

## 2019-07-04 DIAGNOSIS — Z8546 Personal history of malignant neoplasm of prostate: Secondary | ICD-10-CM | POA: Insufficient documentation

## 2019-07-04 DIAGNOSIS — Z7982 Long term (current) use of aspirin: Secondary | ICD-10-CM | POA: Diagnosis not present

## 2019-07-04 DIAGNOSIS — Z885 Allergy status to narcotic agent status: Secondary | ICD-10-CM | POA: Insufficient documentation

## 2019-07-04 DIAGNOSIS — E7800 Pure hypercholesterolemia, unspecified: Secondary | ICD-10-CM | POA: Diagnosis not present

## 2019-07-04 DIAGNOSIS — I10 Essential (primary) hypertension: Secondary | ICD-10-CM | POA: Insufficient documentation

## 2019-07-04 DIAGNOSIS — Z96651 Presence of right artificial knee joint: Secondary | ICD-10-CM | POA: Insufficient documentation

## 2019-07-04 DIAGNOSIS — N138 Other obstructive and reflux uropathy: Secondary | ICD-10-CM

## 2019-07-04 DIAGNOSIS — Z87891 Personal history of nicotine dependence: Secondary | ICD-10-CM | POA: Insufficient documentation

## 2019-07-04 HISTORY — DX: Bladder-neck obstruction: N32.0

## 2019-07-04 HISTORY — DX: Cocaine abuse, in remission: F14.11

## 2019-07-04 HISTORY — DX: Personal history of other specified conditions: Z87.898

## 2019-07-04 HISTORY — PX: TRANSURETHRAL INCISION OF BLADDER NECK: SHX6152

## 2019-07-04 LAB — POCT I-STAT, CHEM 8
BUN: 12 mg/dL (ref 8–23)
Calcium, Ion: 1.27 mmol/L (ref 1.15–1.40)
Chloride: 104 mmol/L (ref 98–111)
Creatinine, Ser: 0.7 mg/dL (ref 0.61–1.24)
Glucose, Bld: 116 mg/dL — ABNORMAL HIGH (ref 70–99)
HCT: 47 % (ref 39.0–52.0)
Hemoglobin: 16 g/dL (ref 13.0–17.0)
Potassium: 3.7 mmol/L (ref 3.5–5.1)
Sodium: 142 mmol/L (ref 135–145)
TCO2: 25 mmol/L (ref 22–32)

## 2019-07-04 SURGERY — INCISION, BLADDER NECK, TRANSURETHRAL
Anesthesia: General | Site: Bladder

## 2019-07-04 MED ORDER — FENTANYL CITRATE (PF) 100 MCG/2ML IJ SOLN
INTRAMUSCULAR | Status: AC
  Filled 2019-07-04: qty 2

## 2019-07-04 MED ORDER — PROMETHAZINE HCL 25 MG/ML IJ SOLN
6.2500 mg | INTRAMUSCULAR | Status: DC | PRN
  Filled 2019-07-04: qty 1

## 2019-07-04 MED ORDER — MIDAZOLAM HCL 2 MG/2ML IJ SOLN
INTRAMUSCULAR | Status: AC
  Filled 2019-07-04: qty 2

## 2019-07-04 MED ORDER — LIDOCAINE 2% (20 MG/ML) 5 ML SYRINGE
INTRAMUSCULAR | Status: AC
  Filled 2019-07-04: qty 5

## 2019-07-04 MED ORDER — ACETAMINOPHEN 650 MG RE SUPP
650.0000 mg | RECTAL | Status: DC | PRN
  Filled 2019-07-04: qty 1

## 2019-07-04 MED ORDER — LACTATED RINGERS IV SOLN
INTRAVENOUS | Status: DC
  Administered 2019-07-04: 1000 mL via INTRAVENOUS
  Filled 2019-07-04: qty 1000

## 2019-07-04 MED ORDER — KETOROLAC TROMETHAMINE 30 MG/ML IJ SOLN
30.0000 mg | Freq: Once | INTRAMUSCULAR | Status: AC | PRN
  Administered 2019-07-04: 30 mg via INTRAVENOUS
  Filled 2019-07-04: qty 1

## 2019-07-04 MED ORDER — ONDANSETRON HCL 4 MG/2ML IJ SOLN
INTRAMUSCULAR | Status: AC
  Filled 2019-07-04: qty 2

## 2019-07-04 MED ORDER — PROPOFOL 10 MG/ML IV BOLUS
INTRAVENOUS | Status: AC
  Filled 2019-07-04: qty 20

## 2019-07-04 MED ORDER — LIDOCAINE HCL (CARDIAC) PF 100 MG/5ML IV SOSY
PREFILLED_SYRINGE | INTRAVENOUS | Status: DC | PRN
  Administered 2019-07-04: 80 mg via INTRAVENOUS

## 2019-07-04 MED ORDER — DEXAMETHASONE SODIUM PHOSPHATE 10 MG/ML IJ SOLN
INTRAMUSCULAR | Status: AC
  Filled 2019-07-04: qty 1

## 2019-07-04 MED ORDER — ONDANSETRON HCL 4 MG/2ML IJ SOLN
INTRAMUSCULAR | Status: DC | PRN
  Administered 2019-07-04: 4 mg via INTRAVENOUS

## 2019-07-04 MED ORDER — FENTANYL CITRATE (PF) 100 MCG/2ML IJ SOLN
25.0000 ug | INTRAMUSCULAR | Status: DC | PRN
  Administered 2019-07-04: 25 ug via INTRAVENOUS
  Filled 2019-07-04: qty 1

## 2019-07-04 MED ORDER — CEFAZOLIN SODIUM-DEXTROSE 2-4 GM/100ML-% IV SOLN
2.0000 g | INTRAVENOUS | Status: AC
  Administered 2019-07-04: 2 g via INTRAVENOUS
  Filled 2019-07-04: qty 100

## 2019-07-04 MED ORDER — SODIUM CHLORIDE 0.9% FLUSH
3.0000 mL | Freq: Two times a day (BID) | INTRAVENOUS | Status: DC
  Filled 2019-07-04: qty 3

## 2019-07-04 MED ORDER — TRAMADOL HCL 50 MG PO TABS
50.0000 mg | ORAL_TABLET | Freq: Four times a day (QID) | ORAL | Status: DC | PRN
  Filled 2019-07-04: qty 1

## 2019-07-04 MED ORDER — SODIUM CHLORIDE 0.9% FLUSH
3.0000 mL | INTRAVENOUS | Status: DC | PRN
  Filled 2019-07-04: qty 3

## 2019-07-04 MED ORDER — TRAMADOL HCL 50 MG PO TABS: 50.0000 mg | ORAL_TABLET | Freq: Four times a day (QID) | ORAL | 0 refills | Status: DC | PRN

## 2019-07-04 MED ORDER — SODIUM CHLORIDE 0.9 % IV SOLN
250.0000 mL | INTRAVENOUS | Status: DC | PRN
  Filled 2019-07-04: qty 250

## 2019-07-04 MED ORDER — CEFAZOLIN SODIUM-DEXTROSE 2-4 GM/100ML-% IV SOLN
INTRAVENOUS | Status: AC
  Filled 2019-07-04: qty 100

## 2019-07-04 MED ORDER — FENTANYL CITRATE (PF) 100 MCG/2ML IJ SOLN
25.0000 ug | INTRAMUSCULAR | Status: DC | PRN
  Filled 2019-07-04: qty 1

## 2019-07-04 MED ORDER — DEXAMETHASONE SODIUM PHOSPHATE 4 MG/ML IJ SOLN
INTRAMUSCULAR | Status: DC | PRN
  Administered 2019-07-04: 5 mg via INTRAVENOUS

## 2019-07-04 MED ORDER — FENTANYL CITRATE (PF) 100 MCG/2ML IJ SOLN
INTRAMUSCULAR | Status: DC | PRN
  Administered 2019-07-04 (×2): 25 ug via INTRAVENOUS
  Administered 2019-07-04: 50 ug via INTRAVENOUS

## 2019-07-04 MED ORDER — SODIUM CHLORIDE 0.9 % IR SOLN
Status: DC | PRN
  Administered 2019-07-04: 3000 mL

## 2019-07-04 MED ORDER — KETOROLAC TROMETHAMINE 30 MG/ML IJ SOLN
INTRAMUSCULAR | Status: AC
  Filled 2019-07-04: qty 1

## 2019-07-04 MED ORDER — ACETAMINOPHEN 325 MG PO TABS
650.0000 mg | ORAL_TABLET | ORAL | Status: DC | PRN
  Filled 2019-07-04: qty 2

## 2019-07-04 MED ORDER — MIDAZOLAM HCL 5 MG/5ML IJ SOLN
INTRAMUSCULAR | Status: DC | PRN
  Administered 2019-07-04: 2 mg via INTRAVENOUS

## 2019-07-04 MED ORDER — IOHEXOL 300 MG/ML  SOLN
INTRAMUSCULAR | Status: DC | PRN
  Administered 2019-07-04: 09:00:00 20 mL

## 2019-07-04 MED ORDER — PROPOFOL 10 MG/ML IV BOLUS
INTRAVENOUS | Status: DC | PRN
  Administered 2019-07-04: 150 mg via INTRAVENOUS

## 2019-07-04 SURGICAL SUPPLY — 26 items
BAG DRAIN URO-CYSTO SKYTR STRL (DRAIN) ×2 IMPLANT
BAG URINE DRAIN 2000ML AR STRL (UROLOGICAL SUPPLIES) IMPLANT
BAG URINE DRAINAGE (UROLOGICAL SUPPLIES) IMPLANT
BAG URINE LEG 500ML (DRAIN) ×2 IMPLANT
BALLN NEPHROSTOMY (BALLOONS) ×2
BALLOON NEPHROSTOMY (BALLOONS) ×1 IMPLANT
CATH FOLEY 2WAY SLVR  5CC 20FR (CATHETERS) ×1
CATH FOLEY 2WAY SLVR  5CC 22FR (CATHETERS)
CATH FOLEY 2WAY SLVR 5CC 20FR (CATHETERS) ×1 IMPLANT
CATH FOLEY 2WAY SLVR 5CC 22FR (CATHETERS) IMPLANT
CLOTH BEACON ORANGE TIMEOUT ST (SAFETY) ×2 IMPLANT
ELECT REM PT RETURN 9FT ADLT (ELECTROSURGICAL) ×2
ELECTRODE REM PT RTRN 9FT ADLT (ELECTROSURGICAL) ×1 IMPLANT
EVACUATOR MICROVAS BLADDER (UROLOGICAL SUPPLIES) IMPLANT
GLOVE SURG SS PI 8.0 STRL IVOR (GLOVE) ×2 IMPLANT
GOWN STRL REUS W/ TWL XL LVL3 (GOWN DISPOSABLE) ×3 IMPLANT
GOWN STRL REUS W/TWL XL LVL3 (GOWN DISPOSABLE) ×3
GUIDEWIRE ASNIS 2.0 100 NS (WIRE) ×2 IMPLANT
GUIDEWIRE STR DUAL SENSOR (WIRE) ×2 IMPLANT
HOLDER FOLEY CATH W/STRAP (MISCELLANEOUS) IMPLANT
IV NS IRRIG 3000ML ARTHROMATIC (IV SOLUTION) ×2 IMPLANT
KIT TURNOVER CYSTO (KITS) ×2 IMPLANT
MANIFOLD NEPTUNE II (INSTRUMENTS) ×2 IMPLANT
PACK CYSTO (CUSTOM PROCEDURE TRAY) ×2 IMPLANT
PLUG CATH AND CAP STER (CATHETERS) IMPLANT
TUBE CONNECTING 12X1/4 (SUCTIONS) IMPLANT

## 2019-07-04 NOTE — Op Note (Signed)
Procedure: 1.  Cystoscopy with cystogram and interpretation. 2.  Incision of bladder neck contracture.  Preop diagnosis: Bladder neck contracture.  Postop diagnosis: Same.  Surgeon: Dr. [name].  Anesthesia: General.  Specimen: None.  Drains: 20 French Foley catheter.  EBL: None.  Complications: None.  Indications: The patient is a 63 year old male with a history of prostate cancer treated with brachii therapy.  He required postoperative TURP for obstruction.  He had done well until recently when his stream became very weak and office cystoscopy demonstrated a severe bladder neck contracture that could not be managed in the office.  He returns now for management of his bladder neck contracture.  Procedure: He was given 2 g of Ancef.  A general anesthetic was induced.  He was placed in the lithotomy position and fitted with PAS hose.  His perineum and genitalia were prepped with Betadine solution and he was draped in the usual sterile fashion.  Cystoscopy was performed using the 23 [place] scope and 30 degree lens.  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  The scope easily passed this area.  The external sphincter was intact.  The prostatic urethra had evidence of prior resection.  A radioactive seed was apparent on the left mid prostatic urethra.  The proximal proximal static urethra was impassable and no apparent lumen was initially identified.  A 5 French open ended catheter was passed through the scope and I eventually located the urethral lumen to the right side of the channel using a sensor guidewire to probe for an opening.  Once the guidewire was in the bladder the open-ended catheter was advanced over the wire and the wire was removed.  Omnipaque was then instilled through the open-ended catheter.  The injection of contrast demonstrated the opening catheter in the bladder  lumen.  The bladder was unremarkable without filling defects.  The guidewire was then re-inserted and once it was coiled in the bladder the open-ended catheter and cystoscope were removed.  A 24 French 15 cm high-pressure balloon was then advanced over the wire into the bladder.  A fair amount of pressure was required to get it across the bladder neck contracture.  Once in position the balloon was inflated to 20 atm of pressure and on fluoroscopy the waist at the bladder neck disappeared.  The balloon had been position to dilate both the bladder neck contracture and the membranous urethra stricture.  The balloon was deflated and removed.  The cystoscope was inserted alongside the wire and the bladder neck contracture appeared to be partially disrupted.  Inspection of the bladder revealed no mucosal lesions other than mild radiation changes in the trigone.  There was mild to moderate trabeculation.  Ureteral orifices were unremarkable.  The cystoscope and wire were then removed and a 26 French continuous-flow resectoscope sheath was placed with the aid of the visual obturator.  The scope was then fitted with an [place] handle with a [employer].  In the bladder neck contracture was then further incised at the 5 and 7:00 positions using bipolar cautery.  There was minimal bleeding from the incision with the radiated tissue.  Once an adequate channel had been created and hemostasis was achieved, the scope was removed.  Pressure on the bladder produced an excellent stream.  A 20 French Foley catheter was inserted and the balloon was filled with 10 mL of sterile fluid.  The return was clear.  The catheter was placed the leg bag drainage.  He was taken down from lithotomy position, his anesthetic was reversed and he was moved to the recovery room in stable condition.  There were no complications.

## 2019-07-04 NOTE — Anesthesia Postprocedure Evaluation (Signed)
Anesthesia Post Note  Patient: Bradley Ray  Procedure(s) Performed: CYSTOSCOPY WITH INCISION OF BLADDER NECK CONTRACTURE, BALLOON DILITATION (N/A Bladder)     Patient location during evaluation: PACU Anesthesia Type: General Level of consciousness: awake and alert Pain management: pain level controlled Vital Signs Assessment: post-procedure vital signs reviewed and stable Respiratory status: spontaneous breathing, nonlabored ventilation, respiratory function stable and patient connected to nasal cannula oxygen Cardiovascular status: blood pressure returned to baseline and stable Postop Assessment: no apparent nausea or vomiting Anesthetic complications: no    Last Vitals:  Vitals:   07/04/19 0934 07/04/19 0945  BP: (!) 158/96 (!) 170/107  Pulse: 82 83  Resp: 16 17  Temp: (!) 36.3 C   SpO2: 98% 98%    Last Pain:  Vitals:   07/04/19 0934  TempSrc:   PainSc: 0-No pain                 Ambra Haverstick S

## 2019-07-04 NOTE — Transfer of Care (Signed)
Immediate Anesthesia Transfer of Care Note  Patient: Bradley Ray  Procedure(s) Performed: CYSTOSCOPY WITH INCISION OF BLADDER NECK CONTRACTURE, BALLOON DILITATION (N/A Bladder)  Patient Location: PACU  Anesthesia Type:General  Level of Consciousness: awake, alert  and oriented  Airway & Oxygen Therapy: Patient Spontanous Breathing and Patient connected to nasal cannula oxygen  Post-op Assessment: Report given to RN and Post -op Vital signs reviewed and stable  Post vital signs: Reviewed and stable  Last Vitals:  Vitals Value Taken Time  BP 158/96 07/04/19 0932  Temp    Pulse 82 07/04/19 0934  Resp 14 07/04/19 0934  SpO2 97 % 07/04/19 0934  Vitals shown include unvalidated device data.  Last Pain:  Vitals:   07/04/19 0721  TempSrc: Oral      Patients Stated Pain Goal: 8 ([id number])  Complications: No apparent anesthesia complications

## 2019-07-04 NOTE — Interval H&P Note (Signed)
History and Physical Interval Note:  07/04/2019 8:39 AM  Bradley Ray  has presented today for surgery, with the diagnosis of [place].  The various methods of treatment have been discussed with the patient and family. After consideration of risks, benefits and other options for treatment, the patient has consented to  Procedure(s): [place] (N/A) as a surgical intervention.  The patient's history has been reviewed, patient examined, no change in status, stable for surgery.  I have reviewed the patient's chart and labs.  Questions were answered to the patient's satisfaction.     [name]

## 2019-07-04 NOTE — Anesthesia Preprocedure Evaluation (Signed)
Anesthesia Evaluation  Patient identified by MRN, date of birth, ID band Patient awake    Reviewed: Allergy & Precautions, NPO status , Patient's Chart, lab work & pertinent test results  Airway Mallampati: II  TM Distance: >3 FB Neck ROM: Full    Dental no notable dental hx. (+) Upper Dentures, Lower Dentures   Pulmonary COPD, former smoker,    Pulmonary exam normal breath sounds clear to auscultation       Cardiovascular negative cardio ROS Normal cardiovascular exam Rhythm:Regular Rate:Normal     Neuro/Psych negative neurological ROS  negative psych ROS   GI/Hepatic negative GI ROS, Neg liver ROS, (+)     substance abuse  alcohol use,   Endo/Other  negative endocrine ROS  Renal/GU negative Renal ROS  negative genitourinary   Musculoskeletal negative musculoskeletal ROS (+)   Abdominal   Peds negative pediatric ROS (+)  Hematology negative hematology ROS (+)   Anesthesia Other Findings   Reproductive/Obstetrics negative OB ROS                             Anesthesia Physical Anesthesia Plan  ASA: III  Anesthesia Plan: General   Post-op Pain Management:    Induction: Intravenous  PONV Risk Score and Plan: 2 and Ondansetron, Dexamethasone and Treatment may vary due to age or medical condition  Airway Management Planned: LMA  Additional Equipment:   Intra-op Plan:   Post-operative Plan: Extubation in OR  Informed Consent: I have reviewed the patients History and Physical, chart, labs and discussed the procedure including the risks, benefits and alternatives for the proposed anesthesia with the patient or authorized representative who has indicated his/her understanding and acceptance.     Dental advisory given  Plan Discussed with: CRNA and Surgeon  Anesthesia Plan Comments:         Anesthesia Quick Evaluation

## 2019-07-04 NOTE — Anesthesia Procedure Notes (Signed)
Procedure Name: LMA Insertion Date/Time: 07/04/2019 8:57 AM Performed by: Bufford Spikes, CRNA Pre-anesthesia Checklist: Patient identified, Emergency Drugs available, Suction available and Patient being monitored Patient Re-evaluated:Patient Re-evaluated prior to induction Oxygen Delivery Method: Circle system utilized Preoxygenation: Pre-oxygenation with 100% oxygen Induction Type: IV induction Ventilation: Mask ventilation without difficulty LMA: LMA inserted LMA Size: 4.0 Number of attempts: 1 Airway Equipment and Method: Bite block Placement Confirmation: positive ETCO2 Tube secured with: Tape Dental Injury: Teeth and Oropharynx as per pre-operative assessment

## 2019-07-04 NOTE — Discharge Instructions (Addendum)
CYSTOSCOPY HOME CARE INSTRUCTIONS  Activity: Rest for the remainder of the day.  Do not drive or operate equipment today.  You may resume normal activities in one to two days as instructed by your physician.   Meals: Drink plenty of liquids and eat light foods such as gelatin or soup this evening.  You may return to a normal meal plan tomorrow.  Return to Work: You may return to work in one to two days or as instructed by your physician.  Special Instructions / Symptoms: Call your physician if any of these symptoms occur:   -persistent or heavy bleeding  -bleeding which continues after first few urination  -large blood clots that are difficult to pass  -urine stream diminishes or stops completely  -fever equal to or higher than 101 degrees Farenheit.  -cloudy urine with a strong, foul odor  -severe pain  Females should always wipe from front to back after elimination.  You may feel some burning pain when you urinate.  This should disappear with time.  Applying moist heat to the lower abdomen or a hot tub bath may help relieve the pain. \  You may remove the catheter in the morning and if you don't feel comfortable doing that, please call the office and we will have you come to have the catheter removed.    Post Anesthesia Home Care Instructions  Activity: Get plenty of rest for the remainder of the day. A responsible adult should stay with you for 24 hours following the procedure.  For the next 24 hours, DO NOT: -Drive a car -Paediatric nurse -Drink alcoholic beverages -Take any medication unless instructed by your physician -Make any legal decisions or sign important papers.  Meals: Start with liquid foods such as gelatin or soup. Progress to regular foods as tolerated. Avoid greasy, spicy, heavy foods. If nausea and/or vomiting occur, drink only clear liquids until the nausea and/or vomiting subsides. Call your physician if vomiting continues.  Special  Instructions/Symptoms: Your throat may feel dry or sore from the anesthesia or the breathing tube placed in your throat during surgery. If this causes discomfort, gargle with warm salt water. The discomfort should disappear within 24 hours.  If you had a scopolamine patch placed behind your ear for the management of post- operative nausea and/or vomiting:  1. The medication in the patch is effective for 72 hours, after which it should be removed.  Wrap patch in a tissue and discard in the trash. Wash hands thoroughly with soap and water. 2. You may remove the patch earlier than 72 hours if you experience unpleasant side effects which may include dry mouth, dizziness or visual disturbances. 3. Avoid touching the patch. Wash your hands with soap and water after contact with the patch.

## 2019-07-05 ENCOUNTER — Encounter (HOSPITAL_BASED_OUTPATIENT_CLINIC_OR_DEPARTMENT_OTHER): Payer: Self-pay | Admitting: Urology

## 2019-09-27 ENCOUNTER — Encounter (HOSPITAL_COMMUNITY): Payer: Self-pay

## 2019-09-27 ENCOUNTER — Emergency Department (HOSPITAL_COMMUNITY)
Admission: EM | Admit: 2019-09-27 | Discharge: 2019-09-27 | Disposition: A | Discharge status: Home / Self Care | Payer: Medicaid Other | Attending: Emergency Medicine | Admitting: Emergency Medicine

## 2019-09-27 ENCOUNTER — Other Ambulatory Visit: Payer: Self-pay

## 2019-09-27 ENCOUNTER — Emergency Department (HOSPITAL_COMMUNITY): Payer: Medicaid Other

## 2019-09-27 DIAGNOSIS — I5032 Chronic diastolic (congestive) heart failure: Secondary | ICD-10-CM | POA: Insufficient documentation

## 2019-09-27 DIAGNOSIS — Z7982 Long term (current) use of aspirin: Secondary | ICD-10-CM | POA: Insufficient documentation

## 2019-09-27 DIAGNOSIS — Z20828 Contact with and (suspected) exposure to other viral communicable diseases: Secondary | ICD-10-CM | POA: Diagnosis not present

## 2019-09-27 DIAGNOSIS — Z8546 Personal history of malignant neoplasm of prostate: Secondary | ICD-10-CM | POA: Diagnosis not present

## 2019-09-27 DIAGNOSIS — Z87891 Personal history of nicotine dependence: Secondary | ICD-10-CM | POA: Diagnosis not present

## 2019-09-27 DIAGNOSIS — Z79899 Other long term (current) drug therapy: Secondary | ICD-10-CM | POA: Insufficient documentation

## 2019-09-27 DIAGNOSIS — R0981 Nasal congestion: Secondary | ICD-10-CM | POA: Diagnosis not present

## 2019-09-27 DIAGNOSIS — R062 Wheezing: Secondary | ICD-10-CM | POA: Diagnosis not present

## 2019-09-27 DIAGNOSIS — R509 Fever, unspecified: Secondary | ICD-10-CM | POA: Diagnosis present

## 2019-09-27 DIAGNOSIS — I110 Hypertensive heart disease with heart failure: Secondary | ICD-10-CM | POA: Insufficient documentation

## 2019-09-27 DIAGNOSIS — J069 Acute upper respiratory infection, unspecified: Secondary | ICD-10-CM | POA: Insufficient documentation

## 2019-09-27 LAB — BASIC METABOLIC PANEL
Anion gap: 15 (ref 5–15)
BUN: 10 mg/dL (ref 8–23)
CO2: 25 mmol/L (ref 22–32)
Calcium: 9.1 mg/dL (ref 8.9–10.3)
Chloride: 100 mmol/L (ref 98–111)
Creatinine, Ser: 0.97 mg/dL (ref 0.61–1.24)
GFR calc Af Amer: >60 mL/min (ref >60)
GFR calc non Af Amer: >60 mL/min (ref >60)
Glucose, Bld: 179 mg/dL — ABNORMAL HIGH (ref 70–99)
Potassium: 2.8 mmol/L — ABNORMAL LOW (ref 3.5–5.1)
Sodium: 140 mmol/L (ref 135–145)

## 2019-09-27 LAB — CBC WITH DIFFERENTIAL/PLATELET
Abs Immature Granulocytes: 0.05 10*3/uL (ref 0.00–0.07)
Basophils Absolute: 0.1 10*3/uL (ref 0.0–0.1)
Basophils Relative: 0 %
Eosinophils Absolute: 0.2 10*3/uL (ref 0.0–0.5)
Eosinophils Relative: 1 %
HCT: 45.4 % (ref 39.0–52.0)
Hemoglobin: 14.3 g/dL (ref 13.0–17.0)
Immature Granulocytes: 0 %
Lymphocytes Relative: 13 %
Lymphs Abs: 1.8 10*3/uL (ref 0.7–4.0)
MCH: 20.1 pg — ABNORMAL LOW (ref 26.0–34.0)
MCHC: 31.5 g/dL (ref 30.0–36.0)
MCV: 63.8 fL — ABNORMAL LOW (ref 80.0–100.0)
Monocytes Absolute: 0.7 10*3/uL (ref 0.1–1.0)
Monocytes Relative: 5 %
Neutro Abs: 11.3 10*3/uL — ABNORMAL HIGH (ref 1.7–7.7)
Neutrophils Relative %: 81 %
Platelets: 240 10*3/uL (ref 150–400)
RBC: 7.12 MIL/uL — ABNORMAL HIGH (ref 4.22–5.81)
RDW: 18.9 % — ABNORMAL HIGH (ref 11.5–15.5)
WBC: 14 10*3/uL — ABNORMAL HIGH (ref 4.0–10.5)
nRBC: 0 % (ref 0.0–0.2)

## 2019-09-27 LAB — POC SARS CORONAVIRUS 2 AG -  ED: SARS Coronavirus 2 Ag: NEGATIVE

## 2019-09-27 LAB — SARS CORONAVIRUS 2 (TAT 6-24 HRS): SARS Coronavirus 2: NEGATIVE

## 2019-09-27 MED ORDER — AEROCHAMBER PLUS FLO-VU LARGE MISC
Status: AC
  Administered 2019-09-27: 1
  Filled 2019-09-27: qty 1

## 2019-09-27 MED ORDER — AZITHROMYCIN 250 MG PO TABS
500.0000 mg | ORAL_TABLET | Freq: Once | ORAL | Status: AC
  Administered 2019-09-27: 16:00:00 500 mg via ORAL
  Filled 2019-09-27: qty 2

## 2019-09-27 MED ORDER — PREDNISONE 20 MG PO TABS: 40.0000 mg | ORAL_TABLET | Freq: Every day | ORAL | 0 refills | Status: DC

## 2019-09-27 MED ORDER — MAGNESIUM SULFATE 2 GM/50ML IV SOLN
2.0000 g | Freq: Once | INTRAVENOUS | Status: AC
  Administered 2019-09-27: 13:00:00 2 g via INTRAVENOUS
  Filled 2019-09-27: qty 50

## 2019-09-27 MED ORDER — IPRATROPIUM BROMIDE HFA 17 MCG/ACT IN AERS
4.0000 | INHALATION_SPRAY | Freq: Once | RESPIRATORY_TRACT | Status: AC
  Administered 2019-09-27: 11:00:00 4 via RESPIRATORY_TRACT
  Filled 2019-09-27: qty 12.9

## 2019-09-27 MED ORDER — ALBUTEROL SULFATE HFA 108 (90 BASE) MCG/ACT IN AERS
8.0000 | INHALATION_SPRAY | Freq: Once | RESPIRATORY_TRACT | Status: AC
  Administered 2019-09-27: 8 via RESPIRATORY_TRACT
  Filled 2019-09-27: qty 6.7

## 2019-09-27 MED ORDER — IPRATROPIUM BROMIDE HFA 17 MCG/ACT IN AERS
4.0000 | INHALATION_SPRAY | Freq: Once | RESPIRATORY_TRACT | Status: AC
  Administered 2019-09-27: 11:00:00 4 via RESPIRATORY_TRACT

## 2019-09-27 MED ORDER — AZITHROMYCIN 250 MG PO TABS: 250.0000 mg | ORAL_TABLET | Freq: Every day | ORAL | 0 refills | Status: DC

## 2019-09-27 MED ORDER — ALBUTEROL SULFATE HFA 108 (90 BASE) MCG/ACT IN AERS
8.0000 | INHALATION_SPRAY | Freq: Once | RESPIRATORY_TRACT | Status: AC
  Administered 2019-09-27: 13:00:00 8 via RESPIRATORY_TRACT

## 2019-09-27 MED ORDER — PREDNISONE 20 MG PO TABS
60.0000 mg | ORAL_TABLET | Freq: Once | ORAL | Status: AC
  Administered 2019-09-27: 11:00:00 60 mg via ORAL
  Filled 2019-09-27: qty 3

## 2019-09-27 MED ORDER — IPRATROPIUM BROMIDE HFA 17 MCG/ACT IN AERS
4.0000 | INHALATION_SPRAY | Freq: Once | RESPIRATORY_TRACT | Status: AC
  Administered 2019-09-27: 13:00:00 4 via RESPIRATORY_TRACT

## 2019-09-27 MED ORDER — ALBUTEROL SULFATE HFA 108 (90 BASE) MCG/ACT IN AERS
8.0000 | INHALATION_SPRAY | Freq: Once | RESPIRATORY_TRACT | Status: AC
  Administered 2019-09-27: 8 via RESPIRATORY_TRACT

## 2019-09-27 NOTE — ED Provider Notes (Signed)
[facility] EMERGENCY DEPARTMENT Provider Note   CSN: [id number] Arrival date & time: 09/27/19  [id number]     History Chief Complaint  Patient presents with  . fever/sore throat/ vomiting    Bradley Ray is a 63 y.o. male.  Patient is a 63 year old male with a history of hypertension, hyperlipidemia, diastolic CHF and substance abuse in remission who presents today with 36 hours of cough, congestion, fever and chills.  Symptoms started yesterday when he woke up.  The cough has been so forceful he is even vomiting at times.  Cough is dry and forceful.  Any activity or deep breathing makes the cough worse.  He is also noticed he has been wheezing.  He denies any shortness of breath at this time.  No known Covid contacts.  He denies any history of lung disease or prior asthma.  He does not smoke cigarettes.  He does have a prior history of having an inhaler a few years ago but has not used it since.  Chest pain only with coughing.  No abdominal pain or diarrhea.  Poor appetite.  The history is provided by the patient.       Past Medical History:  Diagnosis Date  . Alcoholism ([place])    per epic documentation long hx dependence--- (06-30-2019  per pt was drinking average 3 shots dialy until 03/ 2020, since then one beer weekly)  . Arthritis    knees  . Beta thalassemia trait    per hematologist/ oncologist note- dr Dorita Sciara--  per blood work-up  dx 02/ 2017  . Bladder neck contracture   . BPH with obstruction/lower urinary tract symptoms   . Cocaine abuse, in remission ([place])    06-30-2019   per pt states last used 12/ 2017  . ED (erectile dysfunction)   . History of chest pain    01-06-2019 hospital admission-- dx acute renal failure, hypotensive with associated shock liver, SIRS with tachycardia/ tachyapnea,  chf diastolic  ;   [id number]  per pt no chest pain since and everything resolved , followed by pcp  . History of hypertension    03/ 2020  after hospital  admission , pt taken off bp meds  . Hyperlipidemia   . Prostate cancer [facility]) urologist-  dr wrenn/  oncologist-  dr Tammi Klippel   dx 07-02-2016,  T1c,  Gleason 3+3,  PSA 6.48,  48.44cc  . 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 Wears dentures    upper    Patient Active Problem List   Diagnosis Date Noted  . Acquired contracture of bladder neck 07/04/2019  . Hypertriglyceridemia 01/07/2019  . Chest pain 01/07/2019  . Chronic diastolic CHF (congestive heart failure) ([place]) 01/07/2019  . AKI (acute kidney injury) ([place]) 01/07/2019  . Shock liver 01/07/2019  . 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 Malignant neoplasm of prostate ([employer]) 08/10/2016  . 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 Essential hypertension 11/26/2015  . 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 High anion gap metabolic acidosis [id number]  . Substance abuse in remission ([place]) 11/26/2015    Past Surgical History:  Procedure Laterality Date  . CYSTOSCOPY N/A 10/15/2016   Procedure: CYSTOSCOPY FLEXIBLE;  Surgeon: [name], MD;  Location: [facility];  Service: Urology;  Laterality: N/A;  NO SEEDS  FOUND IN BLADDER  . IR FLUORO GUIDE CV LINE RIGHT  01/09/2019  . IR US GUIDE VASC ACCESS RIGHT  01/09/2019  . ORIF LEFT ANKLE  1980's  . PROSTATE BIOPSY    . RADIOACTIVE SEED IMPLANT N/A 10/15/2016   Procedure: RADIOACTIVE SEED IMPLANT/BRACHYTHERAPY IMPLANT;  Surgeon: [name], MD;  Location: [facility];  Service: Urology;  Laterality: N/A;     66    SEEDS IMPLANTED  . REPAIR EXTENSOR TENDON Right 06/02/2016   Procedure: RIGHT LONG FINGER EXTENSOR TENDON REPAIR;  Surgeon: Milly Jakob, MD;  Location: [facility];  Service: Orthopedics;  Laterality: Right;  . TOTAL KNEE ARTHROPLASTY Right 2010  approx.  . TRANSURETHRAL INCISION OF BLADDER NECK  N/A 07/04/2019   Procedure: CYSTOSCOPY WITH INCISION OF BLADDER NECK CONTRACTURE, BALLOON DILITATION;  Surgeon: [name], MD;  Location: [facility];  Service: Urology;  Laterality: N/A;  . TRANSURETHRAL RESECTION OF PROSTATE N/A 06/16/2018   Procedure: TRANSURETHRAL RESECTION OF THE PROSTATE (TURP);  Surgeon: [name], MD;  Location: [facility];  Service: Urology;  Laterality: N/A;       Family History  Problem Relation Age of Onset  . Arrhythmia Mother        Has pacer  . Throat cancer Father   . Pancreatic cancer Sister   . Clotting disorder Sister        related knee surgeries  . Cancer Sister        pancreatic  . Sickle cell trait Son   . Cancer Maternal Aunt   . Cancer Maternal Uncle   . Cancer Other     Social History   Tobacco Use  . Smoking status: Former Smoker    Packs/day: 1.00    Years: 30.00    Pack years: 30.00    Types: Cigarettes    Quit date: 06/01/2006    Years since quitting: 13.3  . Smokeless tobacco: Never Used  Substance Use Topics  . Alcohol use: Yes    Comment: per pt on 06-30-2019  states currently one beer wklly since 03/ 2020 (prior to that was down to 3 shots daily)  . Drug use: Not Currently    Types: "Crack" cocaine, Marijuana    Comment: per pt on 06-30-2019  states no cocaine/ marijuana  since 12/ 2017    Home Medications Prior to Admission medications   Medication Sig Start Date End Date Taking? Authorizing Provider  albuterol (PROVENTIL HFA;VENTOLIN HFA) 108 (90 Base) MCG/ACT inhaler Inhale 2 puffs into the lungs every 6 (six) hours as needed for wheezing or shortness of breath.    [provider]  aspirin EC 81 MG tablet Take 81 mg by mouth daily.    [provider]  atorvastatin (LIPITOR) 40 MG tablet Take 1 tablet (40 mg total) by mouth daily. Patient taking differently: Take 40 mg by mouth daily.  11/28/15   Kelvin Cellar, MD  colchicine 0.6 MG tablet Take 0.5 tablets (0.3 mg total) by mouth 2  (two) times a week for 8 doses. Patient taking differently: Take 0.3 mg by mouth 2 (two) times daily as needed.  01/19/19 02/14/19  Antonieta Pert, MD  folic acid (FOLVITE) 1 MG tablet Take 1 tablet (1 mg total) by mouth daily. 01/19/19   Antonieta Pert, MD  Thiamine HCl (B-1 PO) Take by mouth daily.    [provider]  traMADol (ULTRAM) 50 MG tablet Take 1 tablet (50 mg total) by mouth every 6 (six) hours as  needed for moderate pain. 07/04/19   [name], MD  traZODone (DESYREL) 100 MG tablet Take 50 mg by mouth at bedtime as needed.     [provider]    Allergies    Morphine and related  Review of Systems   Review of Systems  Constitutional: Positive for fatigue.  Musculoskeletal: Positive for myalgias.  All other systems reviewed and are negative.   Physical Exam Updated Vital Signs BP (!) 144/106 (BP Location: Right Arm)   Pulse 96   Temp 99.3 F (37.4 C) (Oral)   Resp 18   SpO2 96%   Physical Exam Vitals and nursing note reviewed.  Constitutional:      General: He is not in acute distress.    Appearance: Normal appearance. He is well-developed and normal weight. He is diaphoretic.  HENT:     Head: Normocephalic and atraumatic.     Nose: Congestion present.     Mouth/Throat:     Mouth: Mucous membranes are moist.  Eyes:     Conjunctiva/sclera: Conjunctivae normal.     Pupils: Pupils are equal, round, and reactive to light.  Cardiovascular:     Rate and Rhythm: Normal rate and regular rhythm.     Pulses: Normal pulses.     Heart sounds: No murmur.  Pulmonary:     Effort: Pulmonary effort is normal. No respiratory distress.     Breath sounds: Wheezing present. No rales.     Comments: Coughing constantly on exam Abdominal:     General: There is no distension.     Palpations: Abdomen is soft.     Tenderness: There is no abdominal tenderness. There is no guarding or rebound.  Musculoskeletal:        General: No tenderness. Normal range of motion.      Cervical back: Normal range of motion and neck supple.     Right lower leg: No edema.     Left lower leg: No edema.  Skin:    General: Skin is warm.     Findings: No erythema or rash.  Neurological:     General: No focal deficit present.     Mental Status: He is alert and oriented to person, place, and time.  Psychiatric:        Mood and Affect: Mood normal.        Behavior: Behavior normal.        Thought Content: Thought content normal.     ED Results / Procedures / Treatments   Labs (all labs ordered are listed, but only abnormal results are displayed) Labs Reviewed  CBC WITH DIFFERENTIAL/PLATELET - Abnormal; Notable for the following components:      Result Value   WBC 14.0 (*)    RBC 7.12 (*)    MCV 63.8 (*)    MCH 20.1 (*)    RDW 18.9 (*)    Neutro Abs 11.3 (*)    All other components within normal limits  BASIC METABOLIC PANEL - Abnormal; Notable for the following components:   Potassium 2.8 (*)    Glucose, Bld 179 (*)    All other components within normal limits  SARS CORONAVIRUS 2 (TAT 6-24 HRS)  POC SARS CORONAVIRUS 2 AG -  ED    EKG None  Radiology DG Chest Port 1 View  Result Date: 09/27/2019 CLINICAL DATA:  63 year old male with cough EXAM: PORTABLE CHEST 1 VIEW COMPARISON:  01/06/2019 FINDINGS: Cardiomediastinal silhouette unchanged in size and contour. No evidence of central vascular  congestion. No interlobular septal thickening. No pneumothorax or pleural effusion. No confluent airspace disease. No displaced fracture. IMPRESSION: Negative for acute cardiopulmonary disease Electronically Signed   By: Corrie Mckusick D.O.   On: 09/27/2019 10:24    Procedures Procedures (including critical care time)  Medications Ordered in ED Medications  albuterol (VENTOLIN HFA) 108 (90 Base) MCG/ACT inhaler 8 puff (has no administration in time range)  ipratropium (ATROVENT HFA) inhaler 4 puff (has no administration in time range)  predniSONE (DELTASONE) tablet 60  mg (has no administration in time range)    ED Course  I have reviewed the triage vital signs and the nursing notes.  Pertinent labs & imaging results that were available during my care of the patient were reviewed by me and considered in my medical decision making (see chart for details).    MDM Rules/Calculators/A&P                      Patient presenting today with URI symptoms as well as wheezing and persistent coughing.  No history of COPD or asthma but patient has used inhalers in the past.  He denies any shortness of breath and is satting 96% on room air at this time.  However concern for Covid versus reactive airways.  Lower suspicion for pneumonia or sepsis at this time.  Low suspicion for cardiac etiology and no evidence to suggest fluid overload.  Patient given prednisone, albuterol, Atrovent and chest x-ray pending.  Covid testing done.  10:51 AM X-ray is within normal limits.  After the first round of albuterol and Atrovent patient is breathing more comfortably with less coughing but still has diffuse wheezing.  Will give a second round.  2:12 PM On repeat evaluation after second round of albuterol and Atrovent patient is still wheezing and tight.  Patient was given magnesium and a third round of albuterol and Atrovent.  On repeat evaluation now patient states he feels much better.  He still has mild wheezing throughout.  CBC with leukocytosis but normal hemoglobin and BMP normal except for mild hypokalemia which is most likely related to the recent albuterol use.  Will ambulate patient to ensure no signs of hypoxia.  If normal will plan on discharge with prednisone, albuterol and azithromycin.  3:43 PM Patient stood up and ambulated and oxygen remained in percent.  He had some mild coughing but it was not worsened with walking.  Did feel weak and tired after walking but would like to go home.  He was given azithromycin and prednisone.  Patient was given strict return  precautions.  ANTINIO BURTENSHAW was evaluated in Emergency Department on 09/27/2019 for the symptoms described in the history of present illness. He was evaluated in the context of the global COVID-19 pandemic, which necessitated consideration that the patient might be at risk for infection with the SARS-CoV-2 virus that causes COVID-19. Institutional protocols and algorithms that pertain to the evaluation of patients at risk for COVID-19 are in a state of rapid change based on information released by regulatory bodies including the CDC and federal and state organizations. These policies and algorithms were followed during the patient's care in the ED.  Final Clinical Impression(s) / ED Diagnoses Final diagnoses:  Viral upper respiratory tract infection  Wheezing    Rx / DC Orders ED Discharge Orders         Ordered    azithromycin (ZITHROMAX) 250 MG tablet  Daily     09/27/19 1546  predniSONE (DELTASONE) 20 MG tablet  Daily     09/27/19 1546           Blanchie Dessert, MD 09/27/19 1548

## 2019-09-27 NOTE — Discharge Instructions (Signed)
Your x-ray and lab work today was pretty normal.  There is no signs of pneumonia.  Initial Covid test was negative.  However we are starting you on an antibiotic and a steroid for bronchitis.  You can use the albuterol inhaler with the spacer every 4-6 hours.  You can use 2-4 pumps at a time.  However if your breathing gets worse or you are having any concerns please return to the emergency room.  Otherwise I would recommend you follow-up with your doctor in about 1 week for recheck.

## 2019-09-27 NOTE — ED Triage Notes (Signed)
Patient reports that he developed sore throat, vomiting, chills and not feeling well since yesterday. Denies pain on arrival

## 2019-09-27 NOTE — ED Notes (Signed)
Pt ambulated in hallway while on pulse ox. Pt started at 100% before walking and dropped to 97% while walking and came back up to 98%. Pt tolerated ambulated well and did not cough while walking.

## 2019-11-09 ENCOUNTER — Other Ambulatory Visit: Payer: Self-pay

## 2019-11-09 ENCOUNTER — Emergency Department (HOSPITAL_COMMUNITY)
Admission: EM | Admit: 2019-11-09 | Discharge: 2019-11-09 | Disposition: A | Discharge status: Home / Self Care | Payer: Medicaid Other | Attending: Emergency Medicine | Admitting: Emergency Medicine

## 2019-11-09 ENCOUNTER — Emergency Department (HOSPITAL_COMMUNITY): Payer: Medicaid Other

## 2019-11-09 ENCOUNTER — Encounter (HOSPITAL_COMMUNITY): Payer: Self-pay | Admitting: Emergency Medicine

## 2019-11-09 DIAGNOSIS — R438 Other disturbances of smell and taste: Secondary | ICD-10-CM | POA: Diagnosis present

## 2019-11-09 DIAGNOSIS — R05 Cough: Secondary | ICD-10-CM | POA: Diagnosis not present

## 2019-11-09 DIAGNOSIS — I10 Essential (primary) hypertension: Secondary | ICD-10-CM | POA: Diagnosis not present

## 2019-11-09 DIAGNOSIS — Z87891 Personal history of nicotine dependence: Secondary | ICD-10-CM | POA: Diagnosis not present

## 2019-11-09 DIAGNOSIS — Z79899 Other long term (current) drug therapy: Secondary | ICD-10-CM | POA: Diagnosis not present

## 2019-11-09 DIAGNOSIS — U071 COVID-19: Secondary | ICD-10-CM | POA: Diagnosis not present

## 2019-11-09 DIAGNOSIS — Z8546 Personal history of malignant neoplasm of prostate: Secondary | ICD-10-CM | POA: Diagnosis not present

## 2019-11-09 DIAGNOSIS — Z7982 Long term (current) use of aspirin: Secondary | ICD-10-CM | POA: Diagnosis not present

## 2019-11-09 DIAGNOSIS — I5032 Chronic diastolic (congestive) heart failure: Secondary | ICD-10-CM | POA: Insufficient documentation

## 2019-11-09 HISTORY — DX: COVID-19: U07.1

## 2019-11-09 LAB — POC SARS CORONAVIRUS 2 AG -  ED: SARS Coronavirus 2 Ag: POSITIVE — AB

## 2019-11-09 MED ORDER — DEXAMETHASONE 6 MG PO TABS: 6.0000 mg | ORAL_TABLET | Freq: Every day | ORAL | 0 refills | Status: AC

## 2019-11-09 MED ORDER — ALBUTEROL SULFATE HFA 108 (90 BASE) MCG/ACT IN AERS: 1.0000 | INHALATION_SPRAY | Freq: Four times a day (QID) | RESPIRATORY_TRACT | 0 refills | Status: DC | PRN

## 2019-11-09 NOTE — ED Provider Notes (Signed)
[facility] EMERGENCY DEPARTMENT Provider Note   CSN: [id number] Arrival date & time: 11/09/19  1432     History Chief Complaint  Patient presents with  . covid test/ loss of taste    Bradley Ray is a 64 y.o. male.  64 year old male with past medical history of hypertension, hyperlipidemia, CHF presents with complaint of loss of sense of taste.  Patient states that he had a cough for the past 5 days with occasional chills and sweats, feels short of breath at times, denies chest pain.  No known sick contacts, no travel history, no known exposure to COVID-19.  Reports occasional loose stools.  Denies abdominal pain, lower extremity edema.  Patient is concerned he has Covid and would like to be tested.  No other complaints or concerns.  Bradley Ray was evaluated in Emergency Department on 11/09/2019 for the symptoms described in the history of present illness. He was evaluated in the context of the global COVID-19 pandemic, which necessitated consideration that the patient might be at risk for infection with the SARS-CoV-2 virus that causes COVID-19. Institutional protocols and algorithms that pertain to the evaluation of patients at risk for COVID-19 are in a state of rapid change based on information released by regulatory bodies including the CDC and federal and state organizations. These policies and algorithms were followed during the patient's care in the ED.         Past Medical History:  Diagnosis Date  . Alcoholism ([place])    per epic documentation long hx dependence--- (06-30-2019  per pt was drinking average 3 shots dialy until 03/ 2020, since then one beer weekly)  . Arthritis    knees  . Beta thalassemia trait    per hematologist/ oncologist note- dr Dorita Sciara--  per blood work-up  dx 02/ 2017  . Bladder neck contracture   . BPH with obstruction/lower urinary tract symptoms   . Cocaine abuse, in remission ([place])    06-30-2019   per pt states last used  12/ 2017  . ED (erectile dysfunction)   . History of chest pain    01-06-2019 hospital admission-- dx acute renal failure, hypotensive with associated shock liver, SIRS with tachycardia/ tachyapnea,  chf diastolic  ;   [id number]  per pt no chest pain since and everything resolved , followed by pcp  . History of hypertension    03/ 2020  after hospital admission , pt taken off bp meds  . Hyperlipidemia   . Prostate cancer [facility]) urologist-  dr wrenn/  oncologist-  dr Tammi Klippel   dx 07-02-2016,  T1c,  Gleason 3+3,  PSA 6.48,  48.44cc  . 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 Wears dentures    upper    Patient Active Problem List   Diagnosis Date Noted  . Acquired contracture of bladder neck 07/04/2019  . Hypertriglyceridemia 01/07/2019  . Chest pain 01/07/2019  . Chronic diastolic CHF (congestive heart failure) ([place]) 01/07/2019  . AKI (acute kidney injury) ([place]) 01/07/2019  . Shock liver 01/07/2019  . 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 Malignant neoplasm of prostate ([place]) 08/10/2016  . 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 Essential hypertension 11/26/2015  . 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 High anion gap metabolic acidosis [id number]  . Substance  abuse in remission ([place]) 11/26/2015    Past Surgical History:  Procedure Laterality Date  . CYSTOSCOPY N/A 10/15/2016   Procedure: CYSTOSCOPY FLEXIBLE;  Surgeon: [name], MD;  [facility];  Service: Urology;  Laterality: N/A;  NO SEEDS FOUND IN BLADDER  . IR FLUORO GUIDE CV LINE RIGHT  01/09/2019  . IR US GUIDE VASC ACCESS RIGHT  01/09/2019  . ORIF LEFT ANKLE  1980's  . PROSTATE BIOPSY    . RADIOACTIVE SEED IMPLANT N/A 10/15/2016   Procedure: RADIOACTIVE SEED IMPLANT/BRACHYTHERAPY IMPLANT;  Surgeon: [name], MD;  [facility];   Service: Urology;  Laterality: N/A;     66    SEEDS IMPLANTED  . REPAIR EXTENSOR TENDON Right 06/02/2016   Procedure: RIGHT LONG FINGER EXTENSOR TENDON REPAIR;  Surgeon: Milly Jakob, MD;  Location: [facility];  Service: Orthopedics;  Laterality: Right;  . TOTAL KNEE ARTHROPLASTY Right 2010  approx.  . TRANSURETHRAL INCISION OF BLADDER NECK N/A 07/04/2019   Procedure: CYSTOSCOPY WITH INCISION OF BLADDER NECK CONTRACTURE, BALLOON DILITATION;  Surgeon: [name], MD;  Location: [facility];  Service: Urology;  Laterality: N/A;  . TRANSURETHRAL RESECTION OF PROSTATE N/A 06/16/2018   Procedure: TRANSURETHRAL RESECTION OF THE PROSTATE (TURP);  Surgeon: [name], MD;  [facility];  Service: Urology;  Laterality: N/A;       Family History  Problem Relation Age of Onset  . Arrhythmia Mother        Has pacer  . Throat cancer Father   . Pancreatic cancer Sister   . Clotting disorder Sister        related knee surgeries  . Cancer Sister        pancreatic  . Sickle cell trait Son   . Cancer Maternal Aunt   . Cancer Maternal Uncle   . Cancer Other     Social History   Tobacco Use  . Smoking status: Former Smoker    Packs/day: 1.00    Years: 30.00    Pack years: 30.00    Types: Cigarettes    Quit date: 06/01/2006    Years since quitting: 13.4  . Smokeless tobacco: Never Used  Substance Use Topics  . Alcohol use: Yes    Comment: per pt on 06-30-2019  states currently one beer wklly since 03/ 2020 (prior to that was down to 3 shots daily)  . Drug use: Not Currently    Types: "Crack" cocaine, Marijuana    Comment: per pt on 06-30-2019  states no cocaine/ marijuana  since 12/ 2017    Home Medications Prior to Admission medications   Medication Sig Start Date End Date Taking? Authorizing Provider  albuterol (PROVENTIL HFA;VENTOLIN HFA) 108 (90 Base) MCG/ACT inhaler Inhale 2 puffs into the lungs every 6 (six) hours as needed for wheezing or shortness  of breath.    [provider]  albuterol (VENTOLIN HFA) 108 (90 Base) MCG/ACT inhaler Inhale 1-2 puffs into the lungs every 6 (six) hours as needed for wheezing or shortness of breath. 11/09/19   Tacy Learn, PA-C  amLODipine (NORVASC) 5 MG tablet Take 5 mg by mouth daily.    [provider]  aspirin EC 81 MG tablet Take 81 mg by mouth daily.    [provider]  atorvastatin (LIPITOR) 40 MG tablet Take 1 tablet (40 mg total) by mouth daily. Patient taking differently: Take 40 mg by mouth daily.  11/28/15   Kelvin Cellar, MD  azithromycin [facility])  250 MG tablet Take 1 tablet (250 mg total) by mouth daily. Take 1 tab every day until finished. 09/27/19   Blanchie Dessert, MD  dexamethasone (DECADRON) 6 MG tablet Take 1 tablet (6 mg total) by mouth daily for 7 days. 11/09/19 11/16/19  Tacy Learn, PA-C  folic acid (FOLVITE) 1 MG tablet Take 1 tablet (1 mg total) by mouth daily. 01/19/19   Antonieta Pert, MD  predniSONE (DELTASONE) 20 MG tablet Take 2 tablets (40 mg total) by mouth daily. 09/27/19   Blanchie Dessert, MD  Thiamine HCl (B-1 PO) Take 1 tablet by mouth daily.     [provider]  traMADol (ULTRAM) 50 MG tablet Take 1 tablet (50 mg total) by mouth every 6 (six) hours as needed for moderate pain. 07/04/19   [name], MD  traZODone (DESYREL) 100 MG tablet Take 50 mg by mouth at bedtime as needed for sleep.     [provider]  colchicine 0.6 MG tablet Take 0.5 tablets (0.3 mg total) by mouth 2 (two) times a week for 8 doses. Patient not taking: Reported on 09/27/2019 01/19/19 09/27/19  Antonieta Pert, MD    Allergies    Morphine and related  Review of Systems   Review of Systems  Constitutional: Positive for chills and diaphoresis. Negative for fever.  HENT: Negative for congestion and sore throat.   Respiratory: Positive for cough and shortness of breath. Negative for chest tightness.   Cardiovascular: Negative for chest pain.    Gastrointestinal: Positive for diarrhea. Negative for abdominal pain, nausea and vomiting.  Musculoskeletal: Negative for arthralgias and myalgias.  Skin: Negative for rash and wound.  Allergic/Immunologic: Negative for immunocompromised state.  Neurological: Negative for weakness.  Hematological: Negative for adenopathy.  Psychiatric/Behavioral: Negative for confusion.  All other systems reviewed and are negative.   Physical Exam Updated Vital Signs BP 125/85 (BP Location: Right Arm)   Pulse 96   Temp 99.7 F (37.6 C) (Oral)   Resp 20   SpO2 96%   Physical Exam Vitals and nursing note reviewed.  Constitutional:      General: He is not in acute distress.    Appearance: He is well-developed. He is not diaphoretic.  HENT:     Head: Normocephalic and atraumatic.  Cardiovascular:     Rate and Rhythm: Normal rate and regular rhythm.     Pulses: Normal pulses.     Heart sounds: Normal heart sounds.  Pulmonary:     Effort: Pulmonary effort is normal.     Breath sounds: Normal breath sounds.  Musculoskeletal:     Cervical back: Neck supple.     Right lower leg: No edema.     Left lower leg: No edema.  Skin:    General: Skin is warm and dry.     Findings: No erythema or rash.  Neurological:     Mental Status: He is alert and oriented to person, place, and time.  Psychiatric:        Behavior: Behavior normal.     ED Results / Procedures / Treatments   Labs (all labs ordered are listed, but only abnormal results are displayed) Labs Reviewed  POC SARS CORONAVIRUS 2 AG -  ED - Abnormal; Notable for the following components:      Result Value   SARS Coronavirus 2 Ag POSITIVE (*)    All other components within normal limits    EKG None  Radiology DG Chest Port 1 View  Result Date: 11/09/2019 CLINICAL  DATA:  Cough for 1 week EXAM: PORTABLE CHEST 1 VIEW COMPARISON:  09/27/2019 FINDINGS: Cardiac shadows within normal limits. The lungs are well aerated bilaterally. No  focal infiltrate or sizable effusion is seen. No acute bony abnormality is noted. IMPRESSION: No active disease. Electronically Signed   By: Inez Catalina M.D.   On: 11/09/2019 15:49    Procedures Procedures (including critical care time)  Medications Ordered in ED Medications - No data to display  ED Course  I have reviewed the triage vital signs and the nursing notes.  Pertinent labs & imaging results that were available during my care of the patient were reviewed by me and considered in my medical decision making (see chart for details).  Clinical Course as of Nov 08 1606  Thu Nov 09, 4663  3843 64 year old male presents with complaint of loss of sense of taste and smell, concerned he has Covid and requesting testing.  Patient reports nonproductive cough for the past 5 days and reports loose stools.  Patient is well-appearing at time of exam, lungs are clear.  Resting O2 sat of 93%, ambulatory O2 sat increases to 97%.  Chest x-ray is unremarkable.  Patient's rapid Covid test is positive.  Patient be placed on a short course of Decadron, recommend use of his albuterol inhaler, discussed monitoring O2 sats at home, advised to return to ER at anytime for any worsening or concerning symptoms.  Recommend patient have a virtual visit with his PCP in the next few days for recheck.  Patient verbalizes understanding of discharge instructions and plan.   [LM]    Clinical Course User Index [LM] Roque Lias   MDM Rules/Calculators/A&P                      Final Clinical Impression(s) / ED Diagnoses Final diagnoses:  [id number]    Rx / DC Orders ED Discharge Orders         Ordered    dexamethasone (DECADRON) 6 MG tablet  Daily     11/09/19 1601    albuterol (VENTOLIN HFA) 108 (90 Base) MCG/ACT inhaler  Every 6 hours PRN     11/09/19 1601           Tacy Learn, PA-C 11/09/19 1608    Davonna Belling, MD 11/10/19 2348

## 2019-11-09 NOTE — ED Notes (Signed)
Patient verbalizes understanding of discharge instructions. Opportunity for questioning and answers were provided. Armband removed by staff, pt discharged from ED.  

## 2019-11-09 NOTE — ED Notes (Signed)
ED Provider at bedside. 

## 2019-11-09 NOTE — Discharge Instructions (Addendum)
Home to quarantine for the next 5 or so days (total of 10 days since onset of symptoms). Take Decadron as prescribed and complete the full course.  Use albuterol inhaler as needed as prescribed. Recommend virtual visit recheck with your PCP.  Return to the emergency room for worsening or concerning symptoms. You may be contacted by the hospital for antibody therapy if criteria are met.

## 2019-11-09 NOTE — ED Triage Notes (Signed)
Symptons started about a week ago. PT reports loss of taste, loss smell, loss appetite; reports feeling tired, cough X 1 month, intermittent runny nose, body aches, dumping syndrome(4 episodes of diarrhea in the last 24hrs).

## 2019-11-09 NOTE — ED Notes (Signed)
PT ambulated around the room with O2 sats maintained at 93%-94% RA

## 2019-11-10 ENCOUNTER — Telehealth: Payer: Self-pay | Admitting: Nurse Practitioner

## 2019-11-10 NOTE — Telephone Encounter (Signed)
Called to Discuss with patient about Covid symptoms and the use of bamlanivimab, a monoclonal antibody infusion for those with mild to moderate Covid symptoms and at a high risk of hospitalization.     Pt is qualified for this infusion at the [facility] infusion center due to co-morbid conditions and/or a member of an at-risk group.     Unable to reach pt  

## 2019-11-10 NOTE — Telephone Encounter (Signed)
Called to discuss with Almyra Deforest about Covid symptoms and the use of bamlanivimab, a monoclonal antibody infusion for those with mild to moderate Covid symptoms and at a high risk of hospitalization.     Pt is qualified for this infusion at the [facility] infusion center due to co-morbid conditions and/or a member of an at-risk group.   Patient did not answer. Voicemail left.   Patient Active Problem List   Diagnosis Date Noted  . Acquired contracture of bladder neck 07/04/2019  . Hypertriglyceridemia 01/07/2019  . Chest pain 01/07/2019  . Chronic diastolic CHF (congestive heart failure) ([place]) 01/07/2019  . AKI (acute kidney injury) ([place]) 01/07/2019  . Shock liver 01/07/2019  . 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 Malignant neoplasm of prostate ([place]) 08/10/2016  . 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 Essential hypertension 11/26/2015  . 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 High anion gap metabolic acidosis [id number]  . Substance abuse in remission [facility]) 11/26/2015    Alda Lea, AGPCNP-BC Pager: [phone] Amion: N. [place]

## 2020-04-05 ENCOUNTER — Emergency Department (HOSPITAL_COMMUNITY): Payer: Medicaid Other

## 2020-04-05 ENCOUNTER — Encounter (HOSPITAL_COMMUNITY): Payer: Self-pay | Admitting: Emergency Medicine

## 2020-04-05 ENCOUNTER — Emergency Department (HOSPITAL_COMMUNITY)
Admission: EM | Admit: 2020-04-05 | Discharge: 2020-04-06 | Disposition: A | Discharge status: Home / Self Care | Payer: Medicaid Other | Attending: Emergency Medicine | Admitting: Emergency Medicine

## 2020-04-05 DIAGNOSIS — I5032 Chronic diastolic (congestive) heart failure: Secondary | ICD-10-CM | POA: Insufficient documentation

## 2020-04-05 DIAGNOSIS — Z7982 Long term (current) use of aspirin: Secondary | ICD-10-CM | POA: Diagnosis not present

## 2020-04-05 DIAGNOSIS — Z87891 Personal history of nicotine dependence: Secondary | ICD-10-CM | POA: Insufficient documentation

## 2020-04-05 DIAGNOSIS — Z79899 Other long term (current) drug therapy: Secondary | ICD-10-CM | POA: Insufficient documentation

## 2020-04-05 DIAGNOSIS — M25562 Pain in left knee: Secondary | ICD-10-CM | POA: Diagnosis not present

## 2020-04-05 DIAGNOSIS — T1490XA Injury, unspecified, initial encounter: Secondary | ICD-10-CM

## 2020-04-05 NOTE — ED Triage Notes (Signed)
Pt presents to ED POV. Pt c/o L knee pain. Pt reports a trip and fall. Did not hit head, no other complaints. Pt has full ROM. In L leg

## 2020-04-06 MED ORDER — NAPROXEN 250 MG PO TABS
375.0000 mg | ORAL_TABLET | Freq: Once | ORAL | Status: AC
  Administered 2020-04-06: 375 mg via ORAL
  Filled 2020-04-06: qty 2

## 2020-04-06 MED ORDER — NAPROXEN 375 MG PO TABS: 375.0000 mg | ORAL_TABLET | Freq: Two times a day (BID) | ORAL | 0 refills | Status: DC

## 2020-04-06 NOTE — Discharge Instructions (Signed)
Ice and elevate the knee to reduce swelling. Take Naproxen as prescribed.   Follow up with Dr. Veverly Fells if knee pain continues without improvement.

## 2020-04-06 NOTE — Progress Notes (Signed)
Orthopedic Tech Progress Note Patient Details:  Bradley Ray January 17, 1956 [id number]  Ortho Devices Type of Ortho Device: Knee Immobilizer, Crutches Ortho Device/Splint Location: LLE Ortho Device/Splint Interventions: Application, Adjustment   Post Interventions Patient Tolerated: Well Instructions Provided: Poper ambulation with device, Adjustment of device   [name] 04/06/2020, 4:32 AM

## 2020-04-06 NOTE — ED Notes (Signed)
Pt verbalized understanding of d/c instructions, follow up care and s/s requiring return to ed. Pt had no additional questions and transported to exit via wheelchair.

## 2020-04-06 NOTE — ED Provider Notes (Signed)
[facility] EMERGENCY DEPARTMENT Provider Note   CSN: [id number] Arrival date & time: 04/05/20  2019     History Chief Complaint  Patient presents with  . Knee Pain    Bradley Ray is a 64 y.o. male.  Patient to ED with complaint of left knee pain and swelling since a twisting injury yesterday. He did not fall. He has had a right knee replacement but no history of recurrent left knee pain. No other injury.  The history is provided by the patient. No language interpreter was used.  Knee Pain      Past Medical History:  Diagnosis Date  . Alcoholism ([place])    per epic documentation long hx dependence--- (06-30-2019  per pt was drinking average 3 shots dialy until 03/ 2020, since then one beer weekly)  . Arthritis    knees  . Beta thalassemia trait    per hematologist/ oncologist note- dr Dorita Sciara--  per blood work-up  dx 02/ 2017  . Bladder neck contracture   . BPH with obstruction/lower urinary tract symptoms   . Cocaine abuse, in remission ([place])    06-30-2019   per pt states last used 12/ 2017  . ED (erectile dysfunction)   . History of chest pain    01-06-2019 hospital admission-- dx acute renal failure, hypotensive with associated shock liver, SIRS with tachycardia/ tachyapnea,  chf diastolic  ;   [id number]  per pt no chest pain since and everything resolved , followed by pcp  . History of hypertension    03/ 2020  after hospital admission , pt taken off bp meds  . Hyperlipidemia   . Prostate cancer [facility]) urologist-  dr wrenn/  oncologist-  dr Tammi Klippel   dx 07-02-2016,  T1c,  Gleason 3+3,  PSA 6.48,  48.44cc  . 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 Wears dentures    upper    Patient Active Problem List   Diagnosis Date Noted  . Acquired contracture of  bladder neck 07/04/2019  . Hypertriglyceridemia 01/07/2019  . Chest pain 01/07/2019  . Chronic diastolic CHF (congestive heart failure) ([place]) 01/07/2019  . AKI (acute kidney injury) ([place]) 01/07/2019  . Shock liver 01/07/2019  . 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 Malignant neoplasm of prostate ([place]) 08/10/2016  . 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 Essential hypertension 11/26/2015  . 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 High anion gap metabolic acidosis 32/20/2542  . Substance abuse in remission ([place]) 11/26/2015    Past Surgical History:  Procedure Laterality Date  . CYSTOSCOPY N/A 10/15/2016   Procedure: CYSTOSCOPY FLEXIBLE;  Surgeon: [name], MD;  [facility];  Service: Urology;  Laterality: N/A;  NO SEEDS FOUND IN BLADDER  . IR FLUORO GUIDE CV LINE RIGHT  01/09/2019  . IR US GUIDE VASC ACCESS RIGHT  01/09/2019  . ORIF LEFT ANKLE  1980's  . PROSTATE BIOPSY    . RADIOACTIVE SEED IMPLANT N/A 10/15/2016   Procedure: RADIOACTIVE SEED IMPLANT/BRACHYTHERAPY IMPLANT;  Surgeon: [name], MD;  [facility];  Service: Urology;  Laterality: N/A;     66    SEEDS IMPLANTED  . REPAIR EXTENSOR TENDON Right 06/02/2016   Procedure: RIGHT LONG FINGER EXTENSOR TENDON REPAIR;  Surgeon: Milly Jakob, MD;  Location: [facility];  Service: Orthopedics;  Laterality: Right;  . TOTAL KNEE ARTHROPLASTY Right 2010  approx.  . TRANSURETHRAL INCISION OF BLADDER NECK N/A 07/04/2019   Procedure: CYSTOSCOPY WITH INCISION OF BLADDER NECK CONTRACTURE, BALLOON DILITATION;  Surgeon: [name], MD;  Location: [facility];  Service: Urology;  Laterality: N/A;  . TRANSURETHRAL RESECTION OF PROSTATE N/A 06/16/2018   Procedure: TRANSURETHRAL RESECTION OF THE PROSTATE (TURP);  Surgeon: [name], MD;  [facility];  Service: Urology;  Laterality: N/A;       Family History  Problem Relation Age of Onset  . Arrhythmia Mother        Has pacer  . Throat cancer Father   .  Pancreatic cancer Sister   . Clotting disorder Sister        related knee surgeries  . Cancer Sister        pancreatic  . Sickle cell trait Son   . Cancer Maternal Aunt   . Cancer Maternal Uncle   . Cancer Other     Social History   Tobacco Use  . Smoking status: Former Smoker    Packs/day: 1.00    Years: 30.00    Pack years: 30.00    Types: Cigarettes    Quit date: 06/01/2006    Years since quitting: 13.8  . Smokeless tobacco: Never Used  Vaping Use  . Vaping Use: Never used  Substance Use Topics  . Alcohol use: Yes    Comment: per pt on 06-30-2019  states currently one beer wklly since 03/ 2020 (prior to that was down to 3 shots daily)  . Drug use: Not Currently    Types: "Crack" cocaine, Marijuana    Comment: per pt on 06-30-2019  states no cocaine/ marijuana  since 12/ 2017    Home Medications Prior to Admission medications   Medication Sig Start Date End Date Taking? Authorizing Provider  albuterol (PROVENTIL HFA;VENTOLIN HFA) 108 (90 Base) MCG/ACT inhaler Inhale 2 puffs into the lungs every 6 (six) hours as needed for wheezing or shortness of breath.    [provider]  albuterol (VENTOLIN HFA) 108 (90 Base) MCG/ACT inhaler Inhale 1-2 puffs into the lungs every 6 (six) hours as needed for wheezing or shortness of breath. 11/09/19   Tacy Learn, PA-C  amLODipine (NORVASC) 5 MG tablet Take 5 mg by mouth daily.    [provider]  aspirin EC 81 MG tablet Take 81 mg by mouth daily.    [provider]  atorvastatin (LIPITOR) 40 MG tablet Take 1 tablet (40 mg total) by mouth daily. Patient taking differently: Take 40 mg by mouth daily.  11/28/15   Kelvin Cellar, MD  azithromycin (ZITHROMAX) 250 MG tablet Take 1 tablet (250 mg total) by mouth daily. Take 1 tab every day until finished. 09/27/19   Blanchie Dessert, MD  folic acid (FOLVITE) 1 MG tablet Take 1 tablet (1 mg total) by mouth daily. 01/19/19   Antonieta Pert, MD  predniSONE (DELTASONE) 20  MG tablet Take 2 tablets (40 mg total) by mouth daily. 09/27/19   Blanchie Dessert, MD  Thiamine HCl (B-1 PO) Take 1 tablet by mouth daily.     [provider]  traMADol (ULTRAM) 50 MG tablet Take 1 tablet (50 mg total) by mouth every 6 (six) hours as needed for moderate pain. 07/04/19   [name], MD  traZODone (DESYREL) 100 MG tablet Take 50 mg by mouth at bedtime as needed for sleep.  [provider]  colchicine 0.6 MG tablet Take 0.5 tablets (0.3 mg total) by mouth 2 (two) times a week for 8 doses. Patient not taking: Reported on 09/27/2019 01/19/19 09/27/19  Antonieta Pert, MD    Allergies    Morphine and related  Review of Systems   Review of Systems  Musculoskeletal:       See HPI  Skin: Negative.  Negative for color change and wound.  Neurological: Negative.  Negative for weakness and numbness.    Physical Exam Updated Vital Signs BP 127/83 (BP Location: Right Arm)   Pulse 72   Temp 98.5 F (36.9 C) (Oral)   Resp 16   SpO2 98%   Physical Exam Vitals and nursing note reviewed.  Constitutional:      Appearance: He is well-developed.  Pulmonary:     Effort: Pulmonary effort is normal.  Musculoskeletal:        General: Normal range of motion.     Cervical back: Normal range of motion.     Comments: Right knee has minimal swelling without presence of effusion. No redness or warmth. No bony deformity. No joint laxity. No thigh or calf tenderness.   Skin:    General: Skin is warm and dry.     Findings: No erythema.  Neurological:     Mental Status: He is alert and oriented to person, place, and time.     ED Results / Procedures / Treatments   Labs (all labs ordered are listed, but only abnormal results are displayed) Labs Reviewed - No data to display  EKG None  Radiology DG Knee 1-2 Views Left  Result Date: 04/05/2020 CLINICAL DATA:  Fall EXAM: LEFT KNEE - 1-2 VIEW COMPARISON:  04/23/2008 FINDINGS: No fracture or malalignment. Moderate  patellofemoral, mild lateral and advanced medial degenerative changes. No significant knee effusion. Vascular calcifications. 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 No acute osseous abnormality. Electronically Signed   By: Donavan Foil M.D.   On: 04/05/2020 21:27    Procedures Procedures (including critical care time)  Medications Ordered in ED Medications  naproxen (NAPROSYN) tablet 375 mg (375 mg Oral Given 04/06/20 0418)    ED Course  I have reviewed the triage vital signs and the nursing notes.  Pertinent labs & imaging results that were available during my care of the patient were reviewed by me and considered in my medical decision making (see chart for details).    MDM Rules/Calculators/A&P                          Patient to ED with left knee pain after twisting injury yesterday.   Imaging confirms no fractures. Do not suspect ligament rupture or infection. Knee immobilizer and crutches provided. Will Rx naproxen and ortho follow up.   Final Clinical Impression(s) / ED Diagnoses Final diagnoses:  Injury  1. Left knee sprain  Rx / DC Orders ED Discharge Orders    None       Charlann Lange, Hershal Coria 04/06/20 [place], Ankit, MD 04/06/20 2318

## 2020-07-25 ENCOUNTER — Other Ambulatory Visit: Payer: Self-pay | Admitting: Urology

## 2020-07-25 ENCOUNTER — Other Ambulatory Visit: Payer: Self-pay

## 2020-07-25 ENCOUNTER — Encounter (HOSPITAL_BASED_OUTPATIENT_CLINIC_OR_DEPARTMENT_OTHER): Payer: Self-pay | Admitting: Urology

## 2020-07-25 NOTE — Progress Notes (Signed)
Spoke w/ via phone for pre-op interview---pt Lab needs dos----   I stat 8, ekg            Lab results------echo 01-07-2019 epic, chest xray 11-09-2019 epic COVID test ------07-29-2020 1000 Arrive at -------915 am 08-01-2020 NPO after MN NO Solid Food.  Clear liquids from MN until--815 am then npo Medications to take morning of surgery -----bring inhaler, atorvastatin, amlodipine Diabetic medication -----n/a Patient Special Instructions -----none Pre-Op special Istructions -----none Patient verbalized understanding of instructions that were given at this phone interview. Patient denies shortness of breath, chest pain, fever, cough at this phone interview.

## 2020-07-29 ENCOUNTER — Other Ambulatory Visit (HOSPITAL_COMMUNITY)
Admission: RE | Admit: 2020-07-29 | Discharge: 2020-07-29 | Disposition: A | Discharge status: Home / Self Care | Payer: Medicaid Other | Source: Ambulatory Visit | Attending: Urology | Admitting: Urology

## 2020-07-29 DIAGNOSIS — Z01818 Encounter for other preprocedural examination: Secondary | ICD-10-CM | POA: Diagnosis present

## 2020-07-29 DIAGNOSIS — Z20822 Contact with and (suspected) exposure to covid-19: Secondary | ICD-10-CM | POA: Diagnosis not present

## 2020-07-29 LAB — SARS CORONAVIRUS 2 (TAT 6-24 HRS): SARS Coronavirus 2: NEGATIVE

## 2020-07-31 NOTE — H&P (Signed)
I have a urethral stricture.     04/2020: Bradley Ray returns today in f/u for cystoscopy for his history of a BNC. He is doing better with cathing if he takes it slow and uses more lubricant. He has had no hematuria. His IPSS is 20 with a weak stream. He has increased dysuria and his UA looks infected today.   07/17/20: Patient with above noted hx. He presents today with complaints of severe dysuria. He states that this is been ongoing for the past several weeks. He also complains of increased urinary frequency, increased urgency, and weak urinary stream. He has a sensation of incomplete emptying. He continues to perform CIC x3 to 4 times daily. He states he has been having some increased difficulties performing CIC over the past several weeks as well. He states that he will get to a very tight junction and has to apply slight pressure to this area with the catheter for about 45 seconds before his catheter will pass into the bladder. He does complain of intermittent episodes of gross hematuria, as well. He noted gross hematuria earlier this week. He denies any abdominal pain. He denies fevers or chills. No unilateral flank pain.   07/24/20: Ac returns today. He continues to have intermittent difficulty with CIC. He is on antibiotics for a positive culture on 10/6.    CC: AUA Questions Scoring.  HPI: Bradley Ray is a 64 year-old male established patient who is here for evaluation.      AUA Symptom Score: 50% of the time he has the sensation of not emptying his bladder completely when finished urinating. Less than 50% of the time he has to urinate again fewer than two hours after he has finished urinating. Less than 50% of the time he has to start and stop again several times when he urinates. Almost always he finds it difficult to postpone urination. Almost always he has a weak urinary stream. Less than 20% of the time he has to push or strain to begin urination. He has to get up to urinate 3 times from the  time he goes to bed until the time he gets up in the morning.   Calculated AUA Symptom Score: 21    ALLERGIES: Morphine Sulfate - Skin Rash    MEDICATIONS: Aspirin 81 mg tablet,chewable 1 tablet PO Daily  Doxycycline Hyclate 100 mg tablet 1 tablet PO BID  Phenazopyridine Hcl 100 mg tablet 1 tablet PO BID PRN  Atorvastatin Calcium 40 mg tablet 1 tablet PO Daily  Vitamin B12     Notes: blood pressure medication   GU PSH: Catheterize For Residual - 07/17/2020 Complex cystometrogram, w/ void pressure and urethral pressure profile studies, any technique - 2018 Complex Uroflow - 12/15/2019, 11/02/2019, 07/18/2019, 06/29/2019, 2019, 2018, 2018, 2017 Cysto Dilate Stricture (M or F) - 11/02/2019 Cystoscopy - 06/29/2019, 2019, 2018 Cystoscopy TUIP - 07/04/2019 Cystoscopy TURP - 2019 Emg surf Electrd - 2018 Intrabd voidng Press - 2018 Prostate Needle Biopsy - 2017 TRANSPERI NEEDLE PLACE, PROS - 2018       PSH Notes: left ankle surgery 6 screws 2 metal rods placed   NON-GU PSH: Surgical Pathology, Gross And Microscopic Examination For Prostate Needle - 2017 Total Knee Replacement, Right     GU PMH: Dysuria - 07/17/2020, - 2019 Urethral Stricture, Unspec - 07/17/2020 History of prostate cancer, His PSA is up a little bit but his has been variable and the the CIC and possible UTI I will just continue to monitor it  closely. - 03/13/2020, PSA at f/u in 3 months. , - 12/15/2019, His PSA is down to 0.113. , - 11/02/2019, PSA continues to fall. , - 09/25/2019 (Improving), His PSA is down. He will need a repeat in 6 months. , - 06/29/2019, His PSA is up some and he has some firmness at the left base. I am going to have him return in 6 months with a PSA. , - 03/29/2019, His PSA continues to fall. , - 2019, - 2019 (Improving), His PSA has fallen nicely and I will get a repeat today and at f/u in 6 months. , - 2019 Microscopic hematuria (Stable), 3-10 RBC's. - 12/15/2019, He has minimal residual microhematuria that I  will follow. , - 09/26/2018 Weak Urinary Stream (Stable) - 12/15/2019, (Worsening), - 09/25/2019 (Worsening), His flow is markedly reduced and I am concerned about a stricture. I will have him return for a flowrate and cystoscopy. , - 06/26/2019, - 2019 Microscopic hematuria, Urine culture. - 11/02/2019 ED due to arterial insufficiency, He has a new complaint of ED and I will start him on sildenafil. Side effects, precautions and instructions reviewed. - 09/25/2019 Prostate Cancer - 07/18/2019, His PSA was back up slightly at his last visit. I will repeat that today., - 06/26/2019, - 03/29/2019, T1c Nx Mx Gleason 6 disease. , - 2017 Prostate nodule w/o LUTS, He is voiding well s/p TURP. - 03/29/2019 Rising PSA after prostate cancer treatment (Worsening) - 03/29/2019 BPH w/o LUTS (Improving), He is doing very well post TURP. I will have him return in 6 months with a PSA for f/u of his prostate cancer history. - 09/26/2018 BPH w/LUTS, He didn't have much improvement with doubling the tamsulosin and has a ball valving middle lobe. I have discussed options and will get him set up for a TURP of the middle lobe. I reviewd the risks of a TURP including bleeding, infection, incontinence, stricture, need for secondary procedures, ejaculatory and erectile dysfunction, thrombotic events, fluid overload and anesthetic complications. I explained that 95% of men will have relief of the obstructive symptoms and about 70% will have relief of the irritative symptoms. - 2019 Straining on Urination - 2019 Urinary Urgency - 2019 Nocturia - 2019 Hemorrhagic cystitis - 2018 Urinary Retention (Improving), [name] is voiding to completion but he appears to have a minimally symptomatic UTI today. I will send a culture and treat accordingly. - 2018, - 2018 (Stable), He had persistent obstruction on UDS on 4/6. I am going to get a culture today and will have him return in a week. He will remove the foley in the morning and f/u later in the  day for a PVR. He will hold the oxybutynin prior to the foley removal. , - 2018 (Stable), He failed his voiding trial today and has a 3cm prostate with bilobar hyperplasia. His options are to replace the foley and give him a voiding trial in 3-4 weeks with urodynamics or consider a Urolift procedure. I have reviewed the risks of bleeding, infection, injury to adjacent structures, incontinence, strictures, encrustation, sexual/ejaculatory dysfunction, thrombotic events and anesthetic complications. He wanted to go ahead and schedule the Urolift but after review we found that it is not a covered benefit on Medicaid. I will put him on finasteride and reviewed the side effects. he will return for urodynamics in 4 weeks with OV to follow. . A foley was replaced today. , - 2018, - 2018 Urinary Tract Inf, Unspec site - 2018 Urinary Frequency - 2018 Elevated PSA -  2017, He has an elevated PSA with a benign exam. This was his first PSA. , - 2017 Bladder-neck stenosis/contracture, He has obstructive and irritative voiding symptoms. - 2017 Testicular atrophy, He has moderate left testicular atrophy. - 2017      PMH Notes: 3/20 Liver dysfunction and renal failure.   NON-GU PMH: Arthritis Encounter for general adult medical examination without abnormal findings, Encounter for preventive health examination Hypercholesterolemia Hypertension    FAMILY HISTORY: Death - Father Hypertension - Mother Pacemaker Placement - Mother   SOCIAL HISTORY: Marital Status: Single Preferred Language: English; Ethnicity: Not Hispanic Or Latino; Race: Black or African American Current Smoking Status: Patient does not smoke anymore. Has not smoked since 05/12/2008.   Tobacco Use Assessment Completed: Used Tobacco in last 30 days? Has not drank since 12/11/2018.  Does not drink caffeine. Patient's occupation is/was disabled.    REVIEW OF SYSTEMS:    GU Review Male:   Patient reports burning/ pain with urination, get up at  night to urinate, and leakage of urine. Patient denies frequent urination, stream starts and stops, trouble starting your stream, have to strain to urinate , erection problems, and penile pain.  Gastrointestinal (Upper):   Patient denies nausea, vomiting, and indigestion/ heartburn.  Gastrointestinal (Lower):   Patient denies diarrhea and constipation.  Constitutional:   Patient denies fever, night sweats, weight loss, and fatigue.  Skin:   Patient denies skin rash/ lesion and itching.  Eyes:   Patient denies blurred vision and double vision.  Ears/ Nose/ Throat:   Patient denies sore throat and sinus problems.  Hematologic/Lymphatic:   Patient denies swollen glands and easy bruising.  Cardiovascular:   Patient denies leg swelling and chest pains.  Respiratory:   Patient denies cough and shortness of breath.  Endocrine:   Patient denies excessive thirst.  Musculoskeletal:   Patient reports back pain. Patient denies joint pain.  Neurological:   Patient denies headaches and dizziness.  Psychologic:   Patient denies depression and anxiety.   VITAL SIGNS:      07/24/2020 01:47 PM  Weight 182 lb / 82.55 kg  Height 66 in / 167.64 cm  BP 132/82 mmHg  Heart Rate 92 /min  Temperature 98.0 F / 36.6 C  BMI 29.4 kg/m   MULTI-SYSTEM PHYSICAL EXAMINATION:    Constitutional: Well-nourished. No physical deformities. Normally developed. Good grooming.  Respiratory: Normal breath sounds. No labored breathing, no use of accessory muscles.   Cardiovascular: Regular rate and rhythm. No murmur, no gallop.      Complexity of Data:  Records Review:   AUA Symptom Score, Previous Patient Records  Urine Test Review:   Urinalysis, Urine Culture   03/06/20 09/20/19 06/28/19 03/22/19 05/23/18 11/02/17 07/08/17 05/18/16  PSA  Total PSA 0.34 ng/mL 0.113 ng/mL 0.115 ng/mL 0.66 ng/mL 0.13 ng/mL 0.61 ng/mL 0.67 ng/mL 3.87     04/15/16  Hormones  Testosterone, Total 274.9 pg/dL    PROCEDURES:         Cysto  Dilate Urethra Heyman Dilators - [phone]  Risks, benefits, and some of the potential complications of the procedure were discussed. 60ml of 2% lidocaine jelly was instilled intraurethrally.  He is on doxycycline.   Meatus:  Normal size. Normal location. Normal condition.  Urethra:  No strictures.  External Sphincter:  Normal.  Verumontanum:  Normal.  Prostate:  Non-obstructing. No hyperplasia.  Bladder Neck:  Severe bladder neck contracture.  Ureteral Orifices:  Unable to visualize  Bladder:  Unable to visualize.  Heyman dilators were used to dilate the urethra from 55 [place] to 20 [place]. An attempt at placing the 16 fr council catheter was unsuccessful but I was able to get a 62fr coude in place with some resistance. the balloon was filled with 72ml and he was given 1gm rocephin.   The procedure was well tolerated and there were no complications.         Ceftriaxone 1g - [id number], [id number] Qty: 1 Adm. By: Alcide Goodness  Unit: gram Lot No [id number]  Route: IM Exp. 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 I was able to partially dilated it today and get a foley in but he is going to need repeat incision of the [place]. I reivewed the risks of bleeding, infection, incontinence, recurrent stricture, thrombotic events and anesthetic complications.   2   Weak Urinary Stream - R39.12 Chronic, Worsening  3   Urinary Tract Inf, Unspec site - N39.0 Acute, Improving - His UA looks better today. I will repeat the culture.    PLAN:           Orders Labs Urine Culture          Schedule Return Visit/Planned Activity: ASAP - Schedule Surgery             Note: Incision of BNC.           Document Letter(s):  Created for Patient: Clinical Summary         Notes:   CC: Dr. Nolene Ebbs.         Next Appointment:      Next  Appointment: 07/24/2020 01:15 PM    Appointment Type: Office Visit Established Patient    Location: [facility] [phone]    Provider: [name], M.D.    Reason for Visit: cyst/poss dilation

## 2020-08-01 ENCOUNTER — Ambulatory Visit (HOSPITAL_BASED_OUTPATIENT_CLINIC_OR_DEPARTMENT_OTHER): Payer: Medicaid Other | Admitting: Anesthesiology

## 2020-08-01 ENCOUNTER — Other Ambulatory Visit: Payer: Self-pay

## 2020-08-01 ENCOUNTER — Encounter (HOSPITAL_BASED_OUTPATIENT_CLINIC_OR_DEPARTMENT_OTHER): Payer: Self-pay | Admitting: Urology

## 2020-08-01 ENCOUNTER — Ambulatory Visit (HOSPITAL_BASED_OUTPATIENT_CLINIC_OR_DEPARTMENT_OTHER)
Admission: RE | Admit: 2020-08-01 | Discharge: 2020-08-01 | Disposition: A | Discharge status: Home / Self Care | Payer: Medicaid Other | Attending: Urology | Admitting: Urology

## 2020-08-01 ENCOUNTER — Encounter (HOSPITAL_BASED_OUTPATIENT_CLINIC_OR_DEPARTMENT_OTHER)
Admission: RE | Disposition: A | Discharge status: Home / Self Care | Payer: Self-pay | Source: Home / Self Care | Attending: Urology

## 2020-08-01 DIAGNOSIS — Z8249 Family history of ischemic heart disease and other diseases of the circulatory system: Secondary | ICD-10-CM | POA: Insufficient documentation

## 2020-08-01 DIAGNOSIS — N3289 Other specified disorders of bladder: Secondary | ICD-10-CM | POA: Insufficient documentation

## 2020-08-01 DIAGNOSIS — Z885 Allergy status to narcotic agent status: Secondary | ICD-10-CM | POA: Diagnosis not present

## 2020-08-01 DIAGNOSIS — Z87891 Personal history of nicotine dependence: Secondary | ICD-10-CM | POA: Diagnosis not present

## 2020-08-01 DIAGNOSIS — N210 Calculus in bladder: Secondary | ICD-10-CM | POA: Diagnosis not present

## 2020-08-01 DIAGNOSIS — E7800 Pure hypercholesterolemia, unspecified: Secondary | ICD-10-CM | POA: Diagnosis not present

## 2020-08-01 DIAGNOSIS — N320 Bladder-neck obstruction: Secondary | ICD-10-CM | POA: Insufficient documentation

## 2020-08-01 DIAGNOSIS — Z9079 Acquired absence of other genital organ(s): Secondary | ICD-10-CM | POA: Insufficient documentation

## 2020-08-01 DIAGNOSIS — Z8546 Personal history of malignant neoplasm of prostate: Secondary | ICD-10-CM | POA: Diagnosis not present

## 2020-08-01 DIAGNOSIS — Z7982 Long term (current) use of aspirin: Secondary | ICD-10-CM | POA: Diagnosis not present

## 2020-08-01 DIAGNOSIS — Z79899 Other long term (current) drug therapy: Secondary | ICD-10-CM | POA: Diagnosis not present

## 2020-08-01 DIAGNOSIS — I10 Essential (primary) hypertension: Secondary | ICD-10-CM | POA: Diagnosis not present

## 2020-08-01 DIAGNOSIS — J45909 Unspecified asthma, uncomplicated: Secondary | ICD-10-CM | POA: Diagnosis not present

## 2020-08-01 HISTORY — DX: Unspecified asthma, uncomplicated: J45.909

## 2020-08-01 HISTORY — PX: TRANSURETHRAL INCISION OF BLADDER NECK: SHX6152

## 2020-08-01 HISTORY — PX: CYSTOSCOPY: SHX5120

## 2020-08-01 LAB — POCT I-STAT, CHEM 8
BUN: 14 mg/dL (ref 8–23)
Calcium, Ion: 1.21 mmol/L (ref 1.15–1.40)
Chloride: 104 mmol/L (ref 98–111)
Creatinine, Ser: 0.8 mg/dL (ref 0.61–1.24)
Glucose, Bld: 92 mg/dL (ref 70–99)
HCT: 45 % (ref 39.0–52.0)
Hemoglobin: 15.3 g/dL (ref 13.0–17.0)
Potassium: 3.6 mmol/L (ref 3.5–5.1)
Sodium: 143 mmol/L (ref 135–145)
TCO2: 25 mmol/L (ref 22–32)

## 2020-08-01 SURGERY — INCISION, BLADDER NECK, TRANSURETHRAL
Anesthesia: General | Site: Bladder

## 2020-08-01 MED ORDER — LIDOCAINE 2% (20 MG/ML) 5 ML SYRINGE
INTRAMUSCULAR | Status: AC
  Filled 2020-08-01: qty 5

## 2020-08-01 MED ORDER — PHENYLEPHRINE 40 MCG/ML (10ML) SYRINGE FOR IV PUSH (FOR BLOOD PRESSURE SUPPORT)
PREFILLED_SYRINGE | INTRAVENOUS | Status: AC
  Filled 2020-08-01: qty 10

## 2020-08-01 MED ORDER — SODIUM CHLORIDE 0.9 % IR SOLN
Status: DC | PRN
  Administered 2020-08-01: 4000 mL

## 2020-08-01 MED ORDER — SODIUM CHLORIDE 0.9% FLUSH: 3.0000 mL | Freq: Two times a day (BID) | INTRAVENOUS | Status: DC

## 2020-08-01 MED ORDER — ACETAMINOPHEN 325 MG PO TABS: 650.0000 mg | ORAL_TABLET | ORAL | Status: DC | PRN

## 2020-08-01 MED ORDER — DEXAMETHASONE SODIUM PHOSPHATE 10 MG/ML IJ SOLN
INTRAMUSCULAR | Status: AC
  Filled 2020-08-01: qty 1

## 2020-08-01 MED ORDER — MIDAZOLAM HCL 2 MG/2ML IJ SOLN
INTRAMUSCULAR | Status: AC
  Filled 2020-08-01: qty 2

## 2020-08-01 MED ORDER — DEXAMETHASONE SODIUM PHOSPHATE 10 MG/ML IJ SOLN
INTRAMUSCULAR | Status: DC | PRN
  Administered 2020-08-01: 5 mg via INTRAVENOUS

## 2020-08-01 MED ORDER — ONDANSETRON HCL 4 MG/2ML IJ SOLN
INTRAMUSCULAR | Status: DC | PRN
  Administered 2020-08-01: 4 mg via INTRAVENOUS

## 2020-08-01 MED ORDER — CEFAZOLIN SODIUM-DEXTROSE 2-4 GM/100ML-% IV SOLN
INTRAVENOUS | Status: AC
  Filled 2020-08-01: qty 100

## 2020-08-01 MED ORDER — ONDANSETRON HCL 4 MG/2ML IJ SOLN
INTRAMUSCULAR | Status: AC
  Filled 2020-08-01: qty 2

## 2020-08-01 MED ORDER — PHENYLEPHRINE 40 MCG/ML (10ML) SYRINGE FOR IV PUSH (FOR BLOOD PRESSURE SUPPORT)
PREFILLED_SYRINGE | INTRAVENOUS | Status: DC | PRN
  Administered 2020-08-01: 80 ug via INTRAVENOUS

## 2020-08-01 MED ORDER — PROPOFOL 10 MG/ML IV BOLUS
INTRAVENOUS | Status: AC
  Filled 2020-08-01: qty 20

## 2020-08-01 MED ORDER — STERILE WATER FOR IRRIGATION IR SOLN
Status: DC | PRN
  Administered 2020-08-01: 500 mL

## 2020-08-01 MED ORDER — KETOROLAC TROMETHAMINE 30 MG/ML IJ SOLN: 30.0000 mg | Freq: Once | INTRAMUSCULAR | Status: DC | PRN

## 2020-08-01 MED ORDER — MIDAZOLAM HCL 2 MG/2ML IJ SOLN
INTRAMUSCULAR | Status: DC | PRN
  Administered 2020-08-01: .5 mg via INTRAVENOUS

## 2020-08-01 MED ORDER — TRAMADOL HCL 50 MG PO TABS
50.0000 mg | ORAL_TABLET | Freq: Four times a day (QID) | ORAL | 0 refills | Status: DC | PRN
Start: 2020-08-01 — End: 2020-12-16

## 2020-08-01 MED ORDER — SODIUM CHLORIDE 0.9 % IV SOLN: 250.0000 mL | INTRAVENOUS | Status: DC | PRN

## 2020-08-01 MED ORDER — LIDOCAINE 2% (20 MG/ML) 5 ML SYRINGE
INTRAMUSCULAR | Status: DC | PRN
  Administered 2020-08-01: 100 mg via INTRAVENOUS

## 2020-08-01 MED ORDER — OXYCODONE HCL 5 MG/5ML PO SOLN: 5.0000 mg | Freq: Once | ORAL | Status: DC | PRN

## 2020-08-01 MED ORDER — PROPOFOL 10 MG/ML IV BOLUS
INTRAVENOUS | Status: DC | PRN
  Administered 2020-08-01: 200 mg via INTRAVENOUS

## 2020-08-01 MED ORDER — FENTANYL CITRATE (PF) 100 MCG/2ML IJ SOLN
INTRAMUSCULAR | Status: DC | PRN
  Administered 2020-08-01: 50 ug via INTRAVENOUS

## 2020-08-01 MED ORDER — OXYCODONE HCL 5 MG PO TABS: 5.0000 mg | ORAL_TABLET | Freq: Once | ORAL | Status: DC | PRN

## 2020-08-01 MED ORDER — SODIUM CHLORIDE 0.9% FLUSH: 3.0000 mL | INTRAVENOUS | Status: DC | PRN

## 2020-08-01 MED ORDER — OXYCODONE HCL 5 MG PO TABS: 5.0000 mg | ORAL_TABLET | ORAL | Status: DC | PRN

## 2020-08-01 MED ORDER — CEFAZOLIN SODIUM-DEXTROSE 2-4 GM/100ML-% IV SOLN
2.0000 g | INTRAVENOUS | Status: AC
  Administered 2020-08-01: 2 g via INTRAVENOUS

## 2020-08-01 MED ORDER — LACTATED RINGERS IV SOLN: INTRAVENOUS | Status: DC

## 2020-08-01 MED ORDER — FENTANYL CITRATE (PF) 100 MCG/2ML IJ SOLN
INTRAMUSCULAR | Status: AC
  Filled 2020-08-01: qty 2

## 2020-08-01 MED ORDER — FENTANYL CITRATE (PF) 100 MCG/2ML IJ SOLN: 25.0000 ug | INTRAMUSCULAR | Status: DC | PRN

## 2020-08-01 MED ORDER — ACETAMINOPHEN 325 MG RE SUPP: 650.0000 mg | RECTAL | Status: DC | PRN

## 2020-08-01 SURGICAL SUPPLY — 34 items
BAG DRAIN URO-CYSTO SKYTR STRL (DRAIN) ×3 IMPLANT
BAG URINE DRAIN 2000ML AR STRL (UROLOGICAL SUPPLIES) IMPLANT
BAG URINE LEG 500ML (DRAIN) IMPLANT
BASKET STONE 1.7 NGAGE (UROLOGICAL SUPPLIES) IMPLANT
BASKET ZERO TIP NITINOL 2.4FR (BASKET) IMPLANT
CATH FOLEY 2WAY SLVR  5CC 20FR (CATHETERS)
CATH FOLEY 2WAY SLVR  5CC 22FR (CATHETERS)
CATH FOLEY 2WAY SLVR 5CC 20FR (CATHETERS) IMPLANT
CATH FOLEY 2WAY SLVR 5CC 22FR (CATHETERS) IMPLANT
CATH URET 5FR 28IN CONE TIP (BALLOONS)
CATH URET 5FR 28IN OPEN ENDED (CATHETERS) IMPLANT
CATH URET 5FR 70CM CONE TIP (BALLOONS) IMPLANT
CLOTH BEACON ORANGE TIMEOUT ST (SAFETY) ×3 IMPLANT
ELECT HOOK LOOP BIPOLAR (NEEDLE) ×3 IMPLANT
ELECT REM PT RETURN 9FT ADLT (ELECTROSURGICAL) ×3
ELECTRODE REM PT RTRN 9FT ADLT (ELECTROSURGICAL) ×2 IMPLANT
EVACUATOR MICROVAS BLADDER (UROLOGICAL SUPPLIES) IMPLANT
FIBER LASER FLEXIVA 365 (UROLOGICAL SUPPLIES) IMPLANT
FIBER LASER TRAC TIP (UROLOGICAL SUPPLIES) IMPLANT
GLOVE SURG SS PI 8.0 STRL IVOR (GLOVE) ×3 IMPLANT
GOWN STRL REUS W/ TWL XL LVL3 (GOWN DISPOSABLE) ×2 IMPLANT
GOWN STRL REUS W/TWL XL LVL3 (GOWN DISPOSABLE) ×6 IMPLANT
GUIDEWIRE ANG ZIPWIRE 038X150 (WIRE) IMPLANT
GUIDEWIRE STR DUAL SENSOR (WIRE) ×6 IMPLANT
HOLDER FOLEY CATH W/STRAP (MISCELLANEOUS) IMPLANT
IV NS IRRIG 3000ML ARTHROMATIC (IV SOLUTION) ×3 IMPLANT
KIT TURNOVER CYSTO (KITS) ×3 IMPLANT
LOOP CUT BIPOLAR 24F LRG (ELECTROSURGICAL) ×3 IMPLANT
MANIFOLD NEPTUNE II (INSTRUMENTS) ×3 IMPLANT
NS IRRIG 500ML POUR BTL (IV SOLUTION) ×3 IMPLANT
PACK CYSTO (CUSTOM PROCEDURE TRAY) ×3 IMPLANT
PLUG CATH AND CAP STER (CATHETERS) IMPLANT
TUBE CONNECTING 12X1/4 (SUCTIONS) ×3 IMPLANT
TUBING UROLOGY SET (TUBING) IMPLANT

## 2020-08-01 NOTE — Anesthesia Postprocedure Evaluation (Signed)
Anesthesia Post Note  Patient: Bradley Ray  Procedure(s) Performed: INCISION OF BLADDER NECK (N/A ) CYSTOSCOPY/removal of bladder stone and bladder neck biopsy (N/A Bladder)     Patient location during evaluation: PACU Anesthesia Type: General Level of consciousness: awake and alert Pain management: pain level controlled Vital Signs Assessment: post-procedure vital signs reviewed and stable Respiratory status: spontaneous breathing, nonlabored ventilation, respiratory function stable and patient connected to nasal cannula oxygen Cardiovascular status: blood pressure returned to baseline and stable Postop Assessment: no apparent nausea or vomiting Anesthetic complications: no   No complications documented.  Last Vitals:  Vitals:   08/01/20 1216 08/01/20 1230  BP: (!) 137/96 132/87  Pulse: 84 74  Resp: 17 (!) 21  Temp: 36.7 C   SpO2: 97% 96%    Last Pain:  Vitals:   08/01/20 1216  TempSrc:   PainSc: 0-No pain                 Shae Augello S

## 2020-08-01 NOTE — Anesthesia Procedure Notes (Signed)
Procedure Name: LMA Insertion Date/Time: 08/01/2020 11:39 AM Performed by: Bonney Aid, CRNA Pre-anesthesia Checklist: Patient identified, Emergency Drugs available, Suction available and Patient being monitored Patient Re-evaluated:Patient Re-evaluated prior to induction Oxygen Delivery Method: Circle system utilized Preoxygenation: Pre-oxygenation with 100% oxygen Induction Type: IV induction Ventilation: Mask ventilation without difficulty LMA: LMA inserted LMA Size: 5.0 Number of attempts: 1 Airway Equipment and Method: Bite block Placement Confirmation: positive ETCO2 Tube secured with: Tape Dental Injury: Teeth and Oropharynx as per pre-operative assessment

## 2020-08-01 NOTE — Transfer of Care (Signed)
Immediate Anesthesia Transfer of Care Note  Patient: Bradley Ray  Procedure(s) Performed: Procedure(s) (LRB): INCISION OF BLADDER NECK (N/A) CYSTOSCOPY (N/A)  Patient Location: PACU  Anesthesia Type: General  Level of Consciousness: awake, alert  and oriented  Airway & Oxygen Therapy: Patient Spontanous Breathing and Patient connected to nasal cannula oxygen  Post-op Assessment: Report given to PACU RN and Post -op Vital signs reviewed and stable  Post vital signs: Reviewed and stable  Complications: No apparent anesthesia complicationsLast Vitals:  Vitals Value Taken Time  BP 137/96 08/01/20 1216  Temp 36.7 C 08/01/20 1216  Pulse 76 08/01/20 1218  Resp 14 08/01/20 1218  SpO2 97 % 08/01/20 1218  Vitals shown include unvalidated device data.  Last Pain:  Vitals:   08/01/20 0928  TempSrc: Oral  PainSc: 0-No pain      Patients Stated Pain Goal: 4 (54/49/20 1007)  Complications: No complications documented.

## 2020-08-01 NOTE — Anesthesia Preprocedure Evaluation (Signed)
Anesthesia Evaluation  Patient identified by MRN, date of birth, ID band Patient awake    Reviewed: Allergy & Precautions, NPO status , Patient's Chart, lab work & pertinent test results  Airway Mallampati: II  TM Distance: >3 FB Neck ROM: Full    Dental no notable dental hx.    Pulmonary asthma , former smoker,    Pulmonary exam normal breath sounds clear to auscultation       Cardiovascular hypertension, Normal cardiovascular exam Rhythm:Regular Rate:Normal     Neuro/Psych negative neurological ROS  negative psych ROS   GI/Hepatic negative GI ROS, Neg liver ROS,   Endo/Other  negative endocrine ROS  Renal/GU negative Renal ROS  negative genitourinary   Musculoskeletal negative musculoskeletal ROS (+)   Abdominal   Peds negative pediatric ROS (+)  Hematology negative hematology ROS (+)   Anesthesia Other Findings   Reproductive/Obstetrics negative OB ROS                             Anesthesia Physical Anesthesia Plan  ASA: II  Anesthesia Plan: General   Post-op Pain Management:    Induction: Intravenous  PONV Risk Score and Plan: 2 and Ondansetron, Dexamethasone and Treatment may vary due to age or medical condition  Airway Management Planned: LMA  Additional Equipment:   Intra-op Plan:   Post-operative Plan: Extubation in OR  Informed Consent: I have reviewed the patients History and Physical, chart, labs and discussed the procedure including the risks, benefits and alternatives for the proposed anesthesia with the patient or authorized representative who has indicated his/her understanding and acceptance.     Dental advisory given  Plan Discussed with: CRNA and Surgeon  Anesthesia Plan Comments:         Anesthesia Quick Evaluation

## 2020-08-01 NOTE — Discharge Instructions (Addendum)
CYSTOSCOPY HOME CARE INSTRUCTIONS  Activity: Rest for the remainder of the day.  Do not drive or operate equipment today.  You may resume normal activities in one to two days as instructed by your physician.   Meals: Drink plenty of liquids and eat light foods such as gelatin or soup this evening.  You may return to a normal meal plan tomorrow.  Return to Work: You may return to work in one to two days or as instructed by your physician.  Special Instructions / Symptoms: Call your physician if any of these symptoms occur:   -persistent or heavy bleeding  -bleeding which continues after first few urination  -large blood clots that are difficult to pass  -urine stream diminishes or stops completely  -fever equal to or higher than 101 degrees Farenheit.  -cloudy urine with a strong, foul odor  -severe pain  Females should always wipe from front to back after elimination.  You may feel some burning pain when you urinate.  This should disappear with time.  Applying moist heat to the lower abdomen or a hot tub bath may help relieve the pain.   You may remove the catheter in the morning by cutting off the side arm.  If you don't feel you can do this, please call the office to be seen.    Indwelling Urinary Catheter Care, Adult An indwelling urinary catheter is a thin tube that is put into your bladder. The tube helps to drain pee (urine) out of your body. The tube goes in through your urethra. Your urethra is where pee comes out of your body. Your pee will come out through the catheter, then it will go into a bag (drainage bag). Take good care of your catheter so it will work well. How to wear your catheter and bag Supplies needed  Sticky tape (adhesive tape) or a leg strap.  Alcohol wipe or soap and water (if you use tape).  A clean towel (if you use tape).  Large overnight bag.  Smaller bag (leg bag). Wearing your catheter Attach your catheter to your leg with tape or a leg  strap.  Make sure the catheter is not pulled tight.  If a leg strap gets wet, take it off and put on a dry strap.  If you use tape to hold the bag on your leg: 1. Use an alcohol wipe or soap and water to wash your skin where the tape made it sticky before. 2. Use a clean towel to pat-dry that skin. 3. Use new tape to make the bag stay on your leg. Wearing your bags You should have been given a large overnight bag.  You may wear the overnight bag in the day or night.  Always have the overnight bag lower than your bladder.  Do not let the bag touch the floor.  Before you go to sleep, put a clean plastic bag in a wastebasket. Then hang the overnight bag inside the wastebasket. You should also have a smaller leg bag that fits under your clothes.  Always wear the leg bag below your knee.  Do not wear your leg bag at night. How to care for your skin and catheter Supplies needed  A clean washcloth.  Water and mild soap.  A clean towel. Caring for your skin and catheter      Clean the skin around your catheter every day: 1. Wash your hands with soap and water. 2. Wet a clean washcloth in warm water and  mild soap. 3. Clean the skin around your urethra.  If you are male:  Gently spread the folds of skin around your vagina (labia).  With the washcloth in your other hand, wipe the inner side of your labia on each side. Wipe from front to back.  If you are male:  Pull back any skin that covers the end of your penis (foreskin).  With the washcloth in your other hand, wipe your penis in small circles. Start wiping at the tip of your penis, then move away from the catheter.  Move the foreskin back in place, if needed. 4. With your free hand, hold the catheter close to where it goes into your body.  Keep holding the catheter during cleaning so it does not get pulled out. 5. With the washcloth in your other hand, clean the catheter.  Only wipe downward on the  catheter.  Do not wipe upward toward your body. Doing this may push germs into your urethra and cause infection. 6. Use a clean towel to pat-dry the catheter and the skin around it. Make sure to wipe off all soap. 7. Wash your hands with soap and water.  Shower every day. Do not take baths.  Do not use cream, ointment, or lotion on the area where the catheter goes into your body, unless your doctor tells you to.  Do not use powders, sprays, or lotions on your genital area.  Check your skin around the catheter every day for signs of infection. Check for: ? Redness, swelling, or pain. ? Fluid or blood. ? Warmth. ? Pus or a bad smell. How to empty the bag Supplies needed  Rubbing alcohol.  Gauze pad or cotton ball.  Tape or a leg strap. Emptying the bag Pour the pee out of your bag when it is ?- full, or at least 2-3 times a day. Do this for your overnight bag and your leg bag. 1. Wash your hands with soap and water. 2. Separate (detach) the bag from your leg. 3. Hold the bag over the toilet or a clean pail. Keep the bag lower than your hips and bladder. This is so the pee (urine) does not go back into the tube. 4. Open the pour spout. It is at the bottom of the bag. 5. Empty the pee into the toilet or pail. Do not let the pour spout touch any surface. 6. Put rubbing alcohol on a gauze pad or cotton ball. 7. Use the gauze pad or cotton ball to clean the pour spout. 8. Close the pour spout. 9. Attach the bag to your leg with tape or a leg strap. 10. Wash your hands with soap and water. Follow instructions for cleaning the drainage bag:  From the product maker.  As told by your doctor. How to change the bag Supplies needed  Alcohol wipes.  A clean bag.  Tape or a leg strap. Changing the bag Replace your bag when it starts to leak, smell bad, or look dirty. 1. Wash your hands with soap and water. 2. Separate the dirty bag from your leg. 3. Pinch the catheter with your  fingers so that pee does not spill out. 4. Separate the catheter tube from the bag tube where these tubes connect (at the connection valve). Do not let the tubes touch any surface. 5. Clean the end of the catheter tube with an alcohol wipe. Use a different alcohol wipe to clean the end of the bag tube. 6. Connect the catheter tube  to the tube of the clean bag. 7. Attach the clean bag to your leg with tape or a leg strap. Do not make the bag tight on your leg. 8. Wash your hands with soap and water. General rules   Never pull on your catheter. Never try to take it out. Doing that can hurt you.  Always wash your hands before and after you touch your catheter or bag. Use a mild, fragrance-free soap. If you do not have soap and water, use hand sanitizer.  Always make sure there are no twists or bends (kinks) in the catheter tube.  Always make sure there are no leaks in the catheter or bag.  Drink enough fluid to keep your pee pale yellow.  Do not take baths, swim, or use a hot tub.  If you are male, wipe from front to back after you poop (have a bowel movement). Contact a doctor if:  Your pee is cloudy.  Your pee smells worse than usual.  Your catheter gets clogged.  Your catheter leaks.  Your bladder feels full. Get help right away if:  You have redness, swelling, or pain where the catheter goes into your body.  You have fluid, blood, pus, or a bad smell coming from the area where the catheter goes into your body.  Your skin feels warm where the catheter goes into your body.  You have a fever.  You have pain in your: ? Belly (abdomen). ? Legs. ? Lower back. ? Bladder.  You see blood in the catheter.  Your pee is pink or red.  You feel sick to your stomach (nauseous).  You throw up (vomit).  You have chills.  Your pee is not draining into the bag.  Your catheter gets pulled out. Summary  An indwelling urinary catheter is a thin tube that is placed into  the bladder to help drain pee (urine) out of the body.  The catheter is placed into the part of the body that drains pee from the bladder (urethra).  Taking good care of your catheter will keep it working properly and help prevent problems.  Always wash your hands before and after touching your catheter or bag.  Never pull on your catheter or try to take it out. This information is not intended to replace advice given to you by your health care provider. Make sure you discuss any questions you have with your health care provider. Document Revised: 01/20/2019 Document Reviewed: 05/14/2017 Elsevier Patient Education  [place] Instructions  Activity: Get plenty of rest for the remainder of the day. A responsible individual must stay with you for 24 hours following the procedure.  For the next 24 hours, DO NOT: -Drive a car -Paediatric nurse -Drink alcoholic beverages -Take any medication unless instructed by your physician -Make any legal decisions or sign important papers.  Meals: Start with liquid foods such as gelatin or soup. Progress to regular foods as tolerated. Avoid greasy, spicy, heavy foods. If nausea and/or vomiting occur, drink only clear liquids until the nausea and/or vomiting subsides. Call your physician if vomiting continues.  Special Instructions/Symptoms: Your throat may feel dry or sore from the anesthesia or the breathing tube placed in your throat during surgery. If this causes discomfort, gargle with warm salt water. The discomfort should disappear within 24 hours.

## 2020-08-01 NOTE — Interval H&P Note (Signed)
History and Physical Interval Note:  08/01/2020 11:02 AM  Bradley Ray  has presented today for surgery, with the diagnosis of [place].  The various methods of treatment have been discussed with the patient and family. After consideration of risks, benefits and other options for treatment, the patient has consented to  Procedure(s) with comments: INCISION OF BLADDER NECK (N/A) - 1 HR CYSTOSCOPY (N/A) as a surgical intervention.  The patient's history has been reviewed, patient examined, no change in status, stable for surgery.  I have reviewed the patient's chart and labs.  Questions were answered to the patient's satisfaction.     [name]

## 2020-08-01 NOTE — Op Note (Signed)
Procedure: 1.  Cystoscopy with transurethral resection of bladder neck lesion. 2.  Removal of bladder stone, simple.  Preop diagnosis: Recurrent bladder neck contracture.  Postop diagnosis: Same with possible neoplastic process and bladder stone.  Surgeon: Dr. [name].  Anesthesia: General.  Specimen: TUR chips from bladder neck contracture and small bladder stone.  Drains: 34 French Foley catheter.  EBL: None.  Complications: None.  Indications: Bradley Ray is a 64 year old African-American male with a history of prostate cancer treated with a seed implant.  He has had recurrent outlet obstruction and previously underwent a transurethral incision of the prostate followed by a TURP and has had issues with recurrent bladder neck contractures that required treatment and self-catheterization.  He has recently had increased difficulty with self-catheterization and office cystoscopy demonstrated a severe recurrence of the bladder neck contraction.  Procedure: He was taken operating room was given 2 g of Ancef.  A general anesthetic was induced.  He was placed in the lithotomy position and fitted with PAS hose.  His perineum and genitalia were prepped with Betadine solution he was draped in usual sterile fashion.  Cystoscopy was performed using a 34 [place] scope with a 30 degree lens.  Examination revealed a normal urethra.  The external sphincter was intact in the mid prostatic urethra there was a stricture that would not allow passage of the scope.  A sensor wire was advanced into the bladder.  A 26 French continuous-flow resectoscope sheath with was passed with the aid of the visual obturator.  I was able to actually negotiate this into the bladder.  He was noted to have approximately 1 cm bladder stone that potentially had been plugging the bladder neck contracture.  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   No papillary tumors were identified.  Ureteral orifices were unremarkable.  The visual obturator was replaced with the [facility] handle with a [employer].  The scope was withdrawn into the prostatic urethra and the point of obstruction appeared to be anteriorly so an incision was made at 12:00 into the scar.  It was rather woody and difficult to incise with a Collins knife and on further inspection there was concern for a neoplastic process as well.  I then replaced the [facility] with a bipolar loop electrode and resected the partially circumferential scar tissue that extended from approximately 8:00 around to 4:00.  Once this tissue had been resected and retrieved along with the stone final hemostasis was achieved.  The scope was then removed and pressure on the bladder produced an excellent stream.  An 74 French Foley catheter was inserted and the balloon was filled with 10 mL sterile fluid.  The catheter was placed to straight drainage.  He was taken down from lithotomy position, his anesthetic was reversed and he was moved recovery in stable condition.  There were no complications.

## 2020-08-02 ENCOUNTER — Encounter (HOSPITAL_BASED_OUTPATIENT_CLINIC_OR_DEPARTMENT_OTHER): Payer: Self-pay | Admitting: Urology

## 2020-08-02 LAB — SURGICAL PATHOLOGY

## 2020-08-08 LAB — CALCULI, WITH PHOTOGRAPH (CLINICAL LAB)
Ammonium Acid Urate Calculi: 40 %
Carbonate Apatite: 20 %
Mg NH4 PO4 (Struvite): 40 %
Weight Calculi: 305 mg

## 2020-12-23 NOTE — H&P (Signed)
KNEE ARTHROPLASTY ADMISSION H&P  Patient ID: LAQUINTON BIHM MRN: [id number] DOB/AGE: Sep 26, 1956 65 y.o.  Chief Complaint: left knee pain.  Planned Procedure Date: 12-31-20 Medical Clearance by Dr. Nolene Ebbs     HPI: Bradley Ray is a 65 y.o. male who presents for evaluation of DEGERATIVE JOINE DISEASELEFT KNEE. The patient has a history of pain and functional disability in the left knee due to arthritis and has failed non-surgical conservative treatments for greater than 12 weeks to include NSAID's and/or analgesics, corticosteriod injections, use of assistive devices and activity modification.  Onset of symptoms was gradual, starting 4 years ago with gradually worsening course since that time. The patient noted no past surgery on the left knee.  Patient currently rates pain at 2 out of 10 with activity. Patient has night pain, worsening of pain with activity and weight bearing, pain that interferes with activities of daily living, pain with passive range of motion and crepitus.  Patient has evidence of subchondral sclerosis, periarticular osteophytes and joint space narrowing by imaging studies.  There is no active infection.  Past Medical History:  Diagnosis Date  . Alcoholism ([place])    per epic documentation long hx dependence--- (06-30-2019  per pt was drinking average 3 shots dialy until 03/ 2020, since then one beer weekly)  . Arthritis    knees  . Asthma   . Beta thalassemia trait    per hematologist/ oncologist note- dr Dorita Sciara--  per blood work-up  dx 02/ 2017  . Bladder neck contracture   . BPH with obstruction/lower urinary tract symptoms   . Cocaine abuse, in remission ([place])    06-30-2019   per pt states last used 12/ 2017  . COVID 11/09/2019   loss of taste and smell and appetite x 14 days all symptoms resolved  . ED (erectile dysfunction)   . Hepatitis    hepatitis a age 65  . History of acute renal failure 12/2018   kidneys fully recovered no nephrologist per pt  .  History of chest pain    01-06-2019 hospital admission-- dx acute renal failure, hypotensive with associated shock liver, SIRS with tachycardia/ tachyapnea,  chf diastolic  ;   [id number]  per pt no chest pain since and everything resolved , followed by pcp  . Hyperlipidemia   . Hypertension   . Prostate cancer [facility]) urologist-  dr wrenn/  oncologist-  dr Tammi Klippel   dx 07-02-2016,  T1c,  Gleason 3+3,  PSA 6.48,  48.44cc  . 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 Wears dentures    upper   Past Surgical History:  Procedure Laterality Date  . CYSTOSCOPY N/A 10/15/2016   Procedure: CYSTOSCOPY FLEXIBLE;  Surgeon: [name], MD;  Location: [facility];  Service: Urology;  Laterality: N/A;  NO SEEDS FOUND IN BLADDER  . CYSTOSCOPY N/A 08/01/2020   Procedure: CYSTOSCOPY/removal of bladder stone and bladder neck biopsy;  Surgeon: [name], MD;  Location: [facility];  Service: Urology;  Laterality: N/A;  . IR FLUORO GUIDE CV LINE RIGHT  01/09/2019  . IR US GUIDE VASC ACCESS RIGHT  01/09/2019  . ORIF LEFT ANKLE  1980's  . PROSTATE BIOPSY    . RADIOACTIVE SEED IMPLANT N/A 10/15/2016   Procedure: RADIOACTIVE SEED IMPLANT/BRACHYTHERAPY IMPLANT;  Surgeon: [name], MD;  Location: [facility];  Service: Urology;  Laterality: N/A;     66    SEEDS IMPLANTED  . REPAIR EXTENSOR TENDON Right 06/02/2016   Procedure: RIGHT LONG FINGER EXTENSOR TENDON REPAIR;  Surgeon: Milly Jakob, MD;  Location: [facility];  Service: Orthopedics;  Laterality: Right;  . TOTAL KNEE ARTHROPLASTY Right 2010  approx.  . TRANSURETHRAL INCISION OF BLADDER NECK N/A 07/04/2019   Procedure: CYSTOSCOPY WITH INCISION OF BLADDER NECK CONTRACTURE, BALLOON DILITATION;  Surgeon: [name], MD;  [facility];   Service: Urology;  Laterality: N/A;  . TRANSURETHRAL INCISION OF BLADDER NECK N/A 08/01/2020   Procedure: INCISION OF BLADDER NECK;  Surgeon: [name], MD;  Location: [facility];  Service: Urology;  Laterality: N/A;  1 HR  . TRANSURETHRAL RESECTION OF PROSTATE N/A 06/16/2018   Procedure: TRANSURETHRAL RESECTION OF THE PROSTATE (TURP);  Surgeon: [name], MD;  Location: [facility];  Service: Urology;  Laterality: N/A;   Allergies  Allergen Reactions  . Morphine And Related Rash   Prior to Admission medications   Medication Sig Start Date End Date Taking? Authorizing Provider  albuterol (PROVENTIL HFA;VENTOLIN HFA) 108 (90 Base) MCG/ACT inhaler Inhale 2 puffs into the lungs every 6 (six) hours as needed for wheezing or shortness of breath.   Yes [provider]  amLODipine (NORVASC) 5 MG tablet Take 5 mg by mouth daily.   Yes [provider]  aspirin EC 81 MG tablet Take 81 mg by mouth daily.   Yes [provider]  atorvastatin (LIPITOR) 40 MG tablet Take 1 tablet (40 mg total) by mouth daily. Patient taking differently: Take 40 mg by mouth daily. 11/28/15  Yes Kelvin Cellar, MD  folic acid (FOLVITE) 1 MG tablet Take 1 tablet (1 mg total) by mouth daily. 01/19/19  Yes Antonieta Pert, MD  Ketotifen Fumarate (ALAWAY OP) Place 1 drop into both eyes daily as needed (itchy eyes).   Yes [provider]  OVER THE COUNTER MEDICATION Apply 1 application topically daily as needed (pain). Hempvana cream   Yes [provider]  thiamine (VITAMIN B-1) 100 MG tablet Take 100 mg by mouth daily.   Yes [provider]  colchicine 0.6 MG tablet Take 0.5 tablets (0.3 mg total) by mouth 2 (two) times a week for 8 doses. Patient not taking: Reported on 09/27/2019 01/19/19 09/27/19  Antonieta Pert, MD   Social History   Socioeconomic History  . Marital status: Single    Spouse name: Not on file  . Number of children: Not on file  . Years of education:  Not on file  . Highest education level: Not on file  Occupational History  . Not on file  Tobacco Use  . Smoking status: Former Smoker    Packs/day: 1.00    Years: 30.00    Pack years: 30.00    Types: Cigarettes    Quit date: 06/01/2006    Years since quitting: 14.5  . Smokeless tobacco: Never Used  Vaping Use  . Vaping Use: Never used  Substance and Sexual Activity  . Alcohol use: Yes    Comment: 1 beer fewdays ago  as of 07-23-2020  . Drug use: Not Currently    Types: "Crack" cocaine, Marijuana    Comment: per pt on 06-30-2019  states no cocaine since  2017/ last marijuana last used 07-25-2020  . Sexual activity: Yes  Other Topics Concern  . Not on file  Social History Narrative  . Not on file   Social Determinants of Health   Financial Resource Strain: Not on file  Food Insecurity: Not on file  Transportation Needs: Not on file  Physical Activity: Not on file  Stress: Not on file  Social Connections: Not on file   Family History  Problem Relation Age of Onset  . Arrhythmia Mother        Has pacer  . Throat cancer Father   . Pancreatic cancer Sister   . Clotting disorder Sister        related knee surgeries  . Cancer Sister        pancreatic  . Sickle cell trait Son   . Cancer Maternal Aunt   . Cancer Maternal Uncle   . Cancer Other     ROS: Currently denies lightheadedness, dizziness, Fever, chills, CP, SOB.   No personal history of DVT, PE, MI, or CVA. No loose teeth but does have dentures.  All other systems have been reviewed and were otherwise currently negative with the exception of those mentioned in the HPI and as above.  Objective: Vitals: Ht: 5'6" Wt: 179.4 lbs Temp: 98.2 BP: 143/75 Pulse: 94 O2 93% on room air.   Physical Exam: General: Alert, NAD.  Antalgic Gait HEENT: EOMI, Good Neck Extension. Trachea is midline. Head is atraumatic, normocephalic. Pulm: No increased work of breathing.  Clear B/L A/P w/o crackle or wheeze.  CV: RRR, No  m/g/r appreciated GI: soft, NT, ND. BS x 4 quadrants Neuro: CN II-XII in tact without gross focal deficit.  Sensation intact distally Skin: No lesions in the area of chief complaint MSK/Surgical Site: left knee w/o redness or effusion.  No JLT. ROM 0-100.  5/5 strength in extension and flexion.  +EHL/FHL.  NVI.  Stable varus and valgus stress. Patellar grinding    Imaging Review Plain radiographs demonstrate severe degenerative joint disease of the left knee.   The overall alignment issignificant varus. The bone quality appears to be fair for age and reported activity level.  Preoperative templating of the joint replacement has been completed, documented, and submitted to the Operating Room personnel in order to optimize intra-operative equipment management.  Assessment: DEGERATIVE JOINE DISEASELEFT KNEE Active Problems:   * No active hospital problems. *   Plan: Plan for Procedure(s): TOTAL KNEE ARTHROPLASTY  The patient history, physical exam, clinical judgement of the provider and imaging are consistent with end stage degenerative joint disease and left knee joint arthroplasty is deemed medically necessary. The treatment options including medical management, injection therapy, and arthroplasty were discussed at length. The risks and benefits of Procedure(s): TOTAL KNEE ARTHROPLASTY were presented and reviewed.  The risks of nonoperative treatment, versus surgical intervention including but not limited to continued pain, aseptic loosening, stiffness, dislocation/subluxation, infection, bleeding, nerve injury, blood clots, cardiopulmonary complications, morbidity, mortality, among others were discussed. The patient verbalizes understanding and wishes to proceed with the plan.   Patient is being admitted for inpatient treatment for surgery, pain control, PT, prophylactic antibiotics, VTE prophylaxis, progressive ambulation, ADL's and discharge planning.   Dental prophylaxis discussed  and recommended for 2 years postoperatively.   The patient does meet the criteria for TXA which will be used perioperatively.    ASA 81 mg BID will be used postoperatively for DVT prophylaxis in addition to SCDs, and early ambulation.  Plan for Ibuprofen and Oxycodone for pain.  Due to his history of liver and kidney failure, we will avoid Mobic, Celebrex, and Tylenol.  Robaxin for muscle spasms.  Zofran for nausea and vomiting. Omeprazole for gastric protection.  The patient is planning to be discharged home in care of his daughter Elwin Sleight who can be reached at [phone].  Has a rolling walker at home to use post-op    Alisa Graff Office [phone] 12/23/2020 2:31 PM

## 2020-12-24 NOTE — Patient Instructions (Addendum)
DUE TO COVID-19 ONLY ONE VISITOR IS ALLOWED TO COME WITH YOU AND STAY IN THE WAITING ROOM ONLY DURING PRE OP AND PROCEDURE DAY OF SURGERY. THE 1 VISITOR  MAY VISIT WITH YOU AFTER SURGERY IN YOUR PRIVATE ROOM DURING VISITING HOURS ONLY!  YOU NEED TO HAVE A COVID 19 TEST ON: 12/27/20 @ 9:00 AM , THIS TEST MUST BE DONE BEFORE SURGERY,  COVID TESTING SITE [place] [address], IT IS ON THE RIGHT GOING OUT [address] APPROXIMATELY  2 MINUTES PAST [employer] ON THE RIGHT. ONCE YOUR COVID TEST IS COMPLETED,  PLEASE BEGIN THE QUARANTINE INSTRUCTIONS AS OUTLINED IN YOUR HANDOUT.                Bradley Ray    Your procedure is scheduled on: 12/31/20   Report to [facility] Main  Entrance   Report to short stay at: 5:30 AM     Call this number if you have problems the morning of surgery [phone]    Remember:  NO SOLID FOOD AFTER MIDNIGHT THE NIGHT PRIOR TO SURGERY. NOTHING BY MOUTH EXCEPT CLEAR LIQUIDS UNTIL: 4:30 AM . PLEASE FINISH ENSURE DRINK PER SURGEON ORDER  WHICH NEEDS TO BE COMPLETED AT: 4:30 AM .  CLEAR LIQUID DIET  Foods Allowed                                                                     Foods Excluded  Coffee and tea, regular and decaf                             liquids that you cannot  Plain Jell-O any favor except red or purple                                           see through such as: Fruit ices (not with fruit pulp)                                     milk, soups, orange juice  Iced Popsicles                                    All solid food Carbonated beverages, regular and diet                                    Cranberry, grape and apple juices Sports drinks like Gatorade Lightly seasoned clear broth or consume(fat free) Sugar, honey syrup  Sample Menu Breakfast                                Lunch  Supper Cranberry juice                    Beef broth                             Chicken broth Jell-O                                     Grape juice                           Apple juice Coffee or tea                        Jell-O                                      Popsicle                                                Coffee or tea                        Coffee or tea  _____________________________________________________________________   BRUSH YOUR TEETH MORNING OF SURGERY AND RINSE YOUR MOUTH OUT, NO CHEWING GUM CANDY OR MINTS.    Take these medicines the morning of surgery with A SIP OF WATER: amlodipine.Use eye drops and inhalers as usual.                               You may not have any metal on your body including hair pins and              piercings  Do not wear jewelry, lotions, powders or perfumes, deodorant             Men may shave face and neck.   Do not bring valuables to the hospital. [place].  Contacts, dentures or bridgework may not be worn into surgery.  Leave suitcase in the car. After surgery it may be brought to your room.     Patients discharged the day of surgery will not be allowed to drive home. IF YOU ARE HAVING SURGERY AND GOING HOME THE SAME DAY, YOU MUST HAVE AN ADULT TO DRIVE YOU HOME AND BE WITH YOU FOR 24 HOURS. YOU MAY GO HOME BY TAXI OR UBER OR ORTHERWISE, BUT AN ADULT MUST ACCOMPANY YOU HOME AND STAY WITH YOU FOR 24 HOURS.  Name and phone number of your driver:  Special Instructions: N/A              Please read over the following fact sheets you were given: _____________________________________________________________________        [facility] - Preparing for Surgery Before surgery, you can play an important role.  Because skin is not sterile, your skin needs to be as free of germs as possible.  You can reduce the number of germs on your skin by  washing with CHG (chlorahexidine gluconate) soap before surgery.  CHG is an antiseptic cleaner which kills germs and bonds with  the skin to continue killing germs even after washing. Please DO NOT use if you have an allergy to CHG or antibacterial soaps.  If your skin becomes reddened/irritated stop using the CHG and inform your nurse when you arrive at Short Stay. Do not shave (including legs and underarms) for at least 48 hours prior to the first CHG shower.  You may shave your face/neck. Please follow these instructions carefully:  1.  Shower with CHG Soap the night before surgery and the  morning of Surgery.  2.  If you choose to wash your hair, wash your hair first as usual with your  normal  shampoo.  3.  After you shampoo, rinse your hair and body thoroughly to remove the  shampoo.                           4.  Use CHG as you would any other liquid soap.  You can apply chg directly  to the skin and wash                       Gently with a scrungie or clean washcloth.  5.  Apply the CHG Soap to your body ONLY FROM THE NECK DOWN.   Do not use on face/ open                           Wound or open sores. Avoid contact with eyes, ears mouth and genitals (private parts).                       Wash face,  Genitals (private parts) with your normal soap.             6.  Wash thoroughly, paying special attention to the area where your surgery  will be performed.  7.  Thoroughly rinse your body with warm water from the neck down.  8.  DO NOT shower/wash with your normal soap after using and rinsing off  the CHG Soap.                9.  Pat yourself dry with a clean towel.            10.  Wear clean pajamas.            11.  Place clean sheets on your bed the night of your first shower and do not  sleep with pets. Day of Surgery : Do not apply any lotions/deodorants the morning of surgery.  Please wear clean clothes to the hospital/surgery center.  FAILURE TO FOLLOW THESE INSTRUCTIONS MAY RESULT IN THE CANCELLATION OF YOUR SURGERY PATIENT SIGNATURE_________________________________  NURSE  SIGNATURE__________________________________  ________________________________________________________________________   Bradley Ray  An incentive spirometer is a tool that can help keep your lungs clear and active. This tool measures how well you are filling your lungs with each breath. Taking long deep breaths may help reverse or decrease the chance of developing breathing (pulmonary) problems (especially infection) following:  A long period of time when you are unable to move or be active. BEFORE THE PROCEDURE   If the spirometer includes an indicator to show your best effort, your nurse or respiratory therapist will set it to a desired goal.  If possible, sit up  straight or lean slightly forward. Try not to slouch.  Hold the incentive spirometer in an upright position. INSTRUCTIONS FOR USE  1. Sit on the edge of your bed if possible, or sit up as far as you can in bed or on a chair. 2. Hold the incentive spirometer in an upright position. 3. Breathe out normally. 4. Place the mouthpiece in your mouth and seal your lips tightly around it. 5. Breathe in slowly and as deeply as possible, raising the piston or the ball toward the top of the column. 6. Hold your breath for 3-5 seconds or for as long as possible. Allow the piston or ball to fall to the bottom of the column. 7. Remove the mouthpiece from your mouth and breathe out normally. 8. Rest for a few seconds and repeat Steps 1 through 7 at least 10 times every 1-2 hours when you are awake. Take your time and take a few normal breaths between deep breaths. 9. The spirometer may include an indicator to show your best effort. Use the indicator as a goal to work toward during each repetition. 10. After each set of 10 deep breaths, practice coughing to be sure your lungs are clear. If you have an incision (the cut made at the time of surgery), support your incision when coughing by placing a pillow or rolled up towels firmly  against it. Once you are able to get out of bed, walk around indoors and cough well. You may stop using the incentive spirometer when instructed by your caregiver.  RISKS AND COMPLICATIONS  Take your time so you do not get dizzy or light-headed.  If you are in pain, you may need to take or ask for pain medication before doing incentive spirometry. It is harder to take a deep breath if you are having pain. AFTER USE  Rest and breathe slowly and easily.  It can be helpful to keep track of a log of your progress. Your caregiver can provide you with a simple table to help with this. If you are using the spirometer at home, follow these instructions: [place] IF:   You are having difficultly using the spirometer.  You have trouble using the spirometer as often as instructed.  Your pain medication is not giving enough relief while using the spirometer.  You develop fever of 100.5 F (38.1 C) or higher. SEEK IMMEDIATE MEDICAL CARE IF:   You cough up bloody sputum that had not been present before.  You develop fever of 102 F (38.9 C) or greater.  You develop worsening pain at or near the incision site. MAKE SURE YOU:   Understand these instructions.  Will watch your condition.  Will get help right away if you are not doing well or get worse. Document Released: 02/08/2007 Document Revised: 12/21/2011 Document Reviewed: 04/11/2007 [facility] Patient Information 2014 [place], [place].   ________________________________________________________________________

## 2020-12-25 ENCOUNTER — Encounter (HOSPITAL_COMMUNITY)
Admission: RE | Admit: 2020-12-25 | Discharge: 2020-12-25 | Disposition: A | Discharge status: Home / Self Care | Payer: Medicaid Other | Source: Ambulatory Visit | Attending: Orthopedic Surgery | Admitting: Orthopedic Surgery

## 2020-12-25 ENCOUNTER — Other Ambulatory Visit: Payer: Self-pay

## 2020-12-25 ENCOUNTER — Encounter (HOSPITAL_COMMUNITY): Payer: Self-pay

## 2020-12-25 DIAGNOSIS — Z01812 Encounter for preprocedural laboratory examination: Secondary | ICD-10-CM | POA: Diagnosis present

## 2020-12-25 HISTORY — DX: Chronic kidney disease, unspecified: N18.9

## 2020-12-25 LAB — BASIC METABOLIC PANEL
Anion gap: 10 (ref 5–15)
BUN: 14 mg/dL (ref 8–23)
CO2: 24 mmol/L (ref 22–32)
Calcium: 9.5 mg/dL (ref 8.9–10.3)
Chloride: 105 mmol/L (ref 98–111)
Creatinine, Ser: 0.91 mg/dL (ref 0.61–1.24)
GFR, Estimated: >60 mL/min (ref >60)
Glucose, Bld: 104 mg/dL — ABNORMAL HIGH (ref 70–99)
Potassium: 3.6 mmol/L (ref 3.5–5.1)
Sodium: 139 mmol/L (ref 135–145)

## 2020-12-25 LAB — CBC
HCT: 42.3 % (ref 39.0–52.0)
Hemoglobin: 13 g/dL (ref 13.0–17.0)
MCH: 20.4 pg — ABNORMAL LOW (ref 26.0–34.0)
MCHC: 30.7 g/dL (ref 30.0–36.0)
MCV: 66.5 fL — ABNORMAL LOW (ref 80.0–100.0)
Platelets: 269 10*3/uL (ref 150–400)
RBC: 6.36 MIL/uL — ABNORMAL HIGH (ref 4.22–5.81)
RDW: 18.6 % — ABNORMAL HIGH (ref 11.5–15.5)
WBC: 6.6 10*3/uL (ref 4.0–10.5)
nRBC: 0 % (ref 0.0–0.2)

## 2020-12-25 LAB — SURGICAL PCR SCREEN
MRSA, PCR: NEGATIVE
Staphylococcus aureus: NEGATIVE

## 2020-12-25 NOTE — Progress Notes (Signed)
COVID Vaccine Completed: Yes Date COVID Vaccine completed: 07/2020 [place] COVID vaccine manufacturer:    Moderna     PCP - Dr. Nolene Ebbs Cardiologist -   Chest x-ray -  EKG - 10/21/21a Stress Test -  ECHO - 01/07/19 Cardiac Cath -  Pacemaker/ICD device last checked:  Sleep Study -  CPAP -   Fasting Blood Sugar -  Checks Blood Sugar _____ times a day  Blood Thinner Instructions: By Dr. Percell Dosh Aspirin Instructions: To hold five days before surgery Last Dose:  Anesthesia review:   Patient denies shortness of breath, fever, cough and chest pain at PAT appointment   Patient verbalized understanding of instructions that were given to them at the PAT appointment. Patient was also instructed that they will need to review over the PAT instructions again at home before surgery.

## 2020-12-27 ENCOUNTER — Other Ambulatory Visit (HOSPITAL_COMMUNITY)
Admission: RE | Admit: 2020-12-27 | Discharge: 2020-12-27 | Disposition: A | Discharge status: Home / Self Care | Payer: Medicaid Other | Source: Ambulatory Visit | Attending: Orthopedic Surgery | Admitting: Orthopedic Surgery

## 2020-12-27 DIAGNOSIS — Z01812 Encounter for preprocedural laboratory examination: Secondary | ICD-10-CM | POA: Diagnosis present

## 2020-12-27 DIAGNOSIS — Z20822 Contact with and (suspected) exposure to covid-19: Secondary | ICD-10-CM | POA: Diagnosis not present

## 2020-12-27 LAB — SARS CORONAVIRUS 2 (TAT 6-24 HRS): SARS Coronavirus 2: NEGATIVE

## 2020-12-30 NOTE — Anesthesia Preprocedure Evaluation (Addendum)
Anesthesia Evaluation  Patient identified by MRN, date of birth, ID band Patient awake    Reviewed: Allergy & Precautions, NPO status , Patient's Chart, lab work & pertinent test results  Airway Mallampati: II  TM Distance: >3 FB Neck ROM: Full    Dental  (+) Edentulous Upper, Edentulous Lower   Pulmonary former smoker,    Pulmonary exam normal breath sounds clear to auscultation       Cardiovascular hypertension, Pt. on medications +CHF  Normal cardiovascular exam Rhythm:Regular Rate:Normal  ECHO: 1. The left ventricle has normal systolic function, with an ejection  fraction of 55-60%. The cavity size was mildly dilated. Left ventricular  diastolic Doppler parameters are consistent with impaired relaxation.  2. The right ventricle has normal systolc function. The cavity was  normal. There is no increase in right ventricular wall thickness.  3. No evidence present in the left atrial appendage.  4. There is mild to moderate mitral annular calcification present.  5. The aortic valve is tricuspid Mild sclerosis of the aortic valve.  Aortic valve regurgitation was not assessed by color flow Doppler.  6. No pulmonic valve vegetation visualized.  7. The interatrial septum appears to be lipomatous.    Neuro/Psych PSYCHIATRIC DISORDERS negative neurological ROS     GI/Hepatic negative GI ROS, (+)     substance abuse  , Hepatitis -  Endo/Other  negative endocrine ROS  Renal/GU negative Renal ROS     Musculoskeletal  (+) Arthritis , Osteoarthritis,    Abdominal   Peds  Hematology  (+) Blood dyscrasia, , HLD   Anesthesia Other Findings DEGERATIVE JOINE DISEASE LEFT KNEE  Reproductive/Obstetrics                            Anesthesia Physical Anesthesia Plan  ASA: III  Anesthesia Plan: General and Regional   Post-op Pain Management: GA combined w/ Regional for post-op pain    Induction: Intravenous  PONV Risk Score and Plan: 2 and Ondansetron, Dexamethasone, Midazolam and Treatment may vary due to age or medical condition  Airway Management Planned: LMA  Additional Equipment:   Intra-op Plan:   Post-operative Plan: Extubation in OR  Informed Consent: I have reviewed the patients History and Physical, chart, labs and discussed the procedure including the risks, benefits and alternatives for the proposed anesthesia with the patient or authorized representative who has indicated his/her understanding and acceptance.     Dental advisory given  Plan Discussed with: CRNA  Anesthesia Plan Comments:         Anesthesia Quick Evaluation

## 2020-12-31 ENCOUNTER — Observation Stay (HOSPITAL_COMMUNITY)
Admission: RE | Admit: 2020-12-31 | Discharge: 2021-01-02 | Disposition: A | Discharge status: Home / Self Care | Payer: Medicaid Other | Source: Ambulatory Visit | Attending: Orthopedic Surgery | Admitting: Orthopedic Surgery

## 2020-12-31 ENCOUNTER — Encounter (HOSPITAL_COMMUNITY): Payer: Self-pay | Admitting: Orthopedic Surgery

## 2020-12-31 ENCOUNTER — Other Ambulatory Visit: Payer: Self-pay

## 2020-12-31 ENCOUNTER — Encounter (HOSPITAL_COMMUNITY)
Admission: RE | Disposition: A | Discharge status: Home / Self Care | Payer: Self-pay | Source: Ambulatory Visit | Attending: Orthopedic Surgery

## 2020-12-31 ENCOUNTER — Ambulatory Visit (HOSPITAL_COMMUNITY): Payer: Medicaid Other | Admitting: Certified Registered Nurse Anesthetist

## 2020-12-31 ENCOUNTER — Ambulatory Visit (HOSPITAL_COMMUNITY): Payer: Medicaid Other

## 2020-12-31 DIAGNOSIS — Z8546 Personal history of malignant neoplasm of prostate: Secondary | ICD-10-CM | POA: Insufficient documentation

## 2020-12-31 DIAGNOSIS — I10 Essential (primary) hypertension: Secondary | ICD-10-CM | POA: Insufficient documentation

## 2020-12-31 DIAGNOSIS — J45909 Unspecified asthma, uncomplicated: Secondary | ICD-10-CM | POA: Insufficient documentation

## 2020-12-31 DIAGNOSIS — R000 Tachycardia, unspecified: Secondary | ICD-10-CM | POA: Insufficient documentation

## 2020-12-31 DIAGNOSIS — Z96651 Presence of right artificial knee joint: Secondary | ICD-10-CM | POA: Diagnosis not present

## 2020-12-31 DIAGNOSIS — Z87891 Personal history of nicotine dependence: Secondary | ICD-10-CM | POA: Diagnosis not present

## 2020-12-31 DIAGNOSIS — R0602 Shortness of breath: Secondary | ICD-10-CM

## 2020-12-31 DIAGNOSIS — Z79899 Other long term (current) drug therapy: Secondary | ICD-10-CM | POA: Insufficient documentation

## 2020-12-31 DIAGNOSIS — R791 Abnormal coagulation profile: Secondary | ICD-10-CM | POA: Diagnosis not present

## 2020-12-31 DIAGNOSIS — M1712 Unilateral primary osteoarthritis, left knee: Principal | ICD-10-CM | POA: Insufficient documentation

## 2020-12-31 DIAGNOSIS — Z96652 Presence of left artificial knee joint: Secondary | ICD-10-CM

## 2020-12-31 DIAGNOSIS — Z8616 Personal history of COVID-19: Secondary | ICD-10-CM | POA: Diagnosis not present

## 2020-12-31 DIAGNOSIS — Z7982 Long term (current) use of aspirin: Secondary | ICD-10-CM | POA: Diagnosis not present

## 2020-12-31 HISTORY — PX: TOTAL KNEE ARTHROPLASTY: SHX125

## 2020-12-31 LAB — CBC WITH DIFFERENTIAL/PLATELET
Abs Immature Granulocytes: 0.06 10*3/uL (ref 0.00–0.07)
Basophils Absolute: 0 10*3/uL (ref 0.0–0.1)
Basophils Relative: 0 %
Eosinophils Absolute: 0 10*3/uL (ref 0.0–0.5)
Eosinophils Relative: 0 %
HCT: 39.2 % (ref 39.0–52.0)
Hemoglobin: 12.1 g/dL — ABNORMAL LOW (ref 13.0–17.0)
Immature Granulocytes: 1 %
Lymphocytes Relative: 14 %
Lymphs Abs: 1.2 10*3/uL (ref 0.7–4.0)
MCH: 20.4 pg — ABNORMAL LOW (ref 26.0–34.0)
MCHC: 30.9 g/dL (ref 30.0–36.0)
MCV: 66.1 fL — ABNORMAL LOW (ref 80.0–100.0)
Monocytes Absolute: 0.2 10*3/uL (ref 0.1–1.0)
Monocytes Relative: 3 %
Neutro Abs: 7.4 10*3/uL (ref 1.7–7.7)
Neutrophils Relative %: 82 %
Platelets: 196 10*3/uL (ref 150–400)
RBC: 5.93 MIL/uL — ABNORMAL HIGH (ref 4.22–5.81)
RDW: 17.9 % — ABNORMAL HIGH (ref 11.5–15.5)
WBC: 9 10*3/uL (ref 4.0–10.5)
nRBC: 0 % (ref 0.0–0.2)

## 2020-12-31 LAB — BASIC METABOLIC PANEL
Anion gap: 12 (ref 5–15)
BUN: 9 mg/dL (ref 8–23)
CO2: 22 mmol/L (ref 22–32)
Calcium: 8.7 mg/dL — ABNORMAL LOW (ref 8.9–10.3)
Chloride: 105 mmol/L (ref 98–111)
Creatinine, Ser: 0.85 mg/dL (ref 0.61–1.24)
GFR, Estimated: >60 mL/min (ref >60)
Glucose, Bld: 124 mg/dL — ABNORMAL HIGH (ref 70–99)
Potassium: 3.2 mmol/L — ABNORMAL LOW (ref 3.5–5.1)
Sodium: 139 mmol/L (ref 135–145)

## 2020-12-31 LAB — TROPONIN I (HIGH SENSITIVITY)
Troponin I (High Sensitivity): <2 ng/L (ref <18)
Troponin I (High Sensitivity): <2 ng/L (ref <18)
Troponin I (High Sensitivity): 2 ng/L (ref <18)
Troponin I (High Sensitivity): 3 ng/L (ref <18)

## 2020-12-31 LAB — IRON AND TIBC
Iron: 77 ug/dL (ref 45–182)
Saturation Ratios: 25 % (ref 17.9–39.5)
TIBC: 314 ug/dL (ref 250–450)
UIBC: 237 ug/dL

## 2020-12-31 LAB — PROTIME-INR
INR: 0.9 (ref 0.8–1.2)
Prothrombin Time: 12.2 seconds (ref 11.4–15.2)

## 2020-12-31 LAB — FERRITIN: Ferritin: 636 ng/mL — ABNORMAL HIGH (ref 24–336)

## 2020-12-31 LAB — APTT: aPTT: 26 seconds (ref 24–36)

## 2020-12-31 SURGERY — ARTHROPLASTY, KNEE, TOTAL
Anesthesia: Regional | Site: Knee | Laterality: Left

## 2020-12-31 MED ORDER — ONDANSETRON HCL 4 MG PO TABS: 4.0000 mg | ORAL_TABLET | Freq: Every day | ORAL | 0 refills | Status: DC | PRN

## 2020-12-31 MED ORDER — PHENOL 1.4 % MT LIQD: 1.0000 | OROMUCOSAL | Status: DC | PRN

## 2020-12-31 MED ORDER — METHOCARBAMOL 500 MG PO TABS: 500.0000 mg | ORAL_TABLET | Freq: Three times a day (TID) | ORAL | 0 refills | Status: DC | PRN

## 2020-12-31 MED ORDER — DOCUSATE SODIUM 100 MG PO CAPS
100.0000 mg | ORAL_CAPSULE | Freq: Two times a day (BID) | ORAL | Status: DC
  Administered 2020-12-31 – 2021-01-02 (×4): 100 mg via ORAL
  Filled 2020-12-31 (×4): qty 1

## 2020-12-31 MED ORDER — DEXAMETHASONE SODIUM PHOSPHATE 10 MG/ML IJ SOLN
8.0000 mg | Freq: Once | INTRAMUSCULAR | Status: AC
  Administered 2020-12-31: 8 mg via INTRAVENOUS

## 2020-12-31 MED ORDER — TRAMADOL HCL 50 MG PO TABS
50.0000 mg | ORAL_TABLET | Freq: Four times a day (QID) | ORAL | Status: DC
  Administered 2020-12-31 – 2021-01-02 (×9): 50 mg via ORAL
  Filled 2020-12-31 (×9): qty 1

## 2020-12-31 MED ORDER — METOCLOPRAMIDE HCL 5 MG PO TABS
5.0000 mg | ORAL_TABLET | Freq: Three times a day (TID) | ORAL | Status: DC | PRN
Start: 2020-12-31 — End: 2021-01-02

## 2020-12-31 MED ORDER — ACETAMINOPHEN 500 MG PO TABS
1000.0000 mg | ORAL_TABLET | Freq: Once | ORAL | Status: AC
  Administered 2020-12-31: 1000 mg via ORAL
  Filled 2020-12-31: qty 2

## 2020-12-31 MED ORDER — CEFAZOLIN SODIUM-DEXTROSE 2-4 GM/100ML-% IV SOLN
2.0000 g | Freq: Four times a day (QID) | INTRAVENOUS | Status: AC
  Administered 2020-12-31 – 2021-01-01 (×2): 2 g via INTRAVENOUS
  Filled 2020-12-31 (×3): qty 100

## 2020-12-31 MED ORDER — BUPIVACAINE-EPINEPHRINE (PF) 0.5% -1:200000 IJ SOLN
INTRAMUSCULAR | Status: DC | PRN
  Administered 2020-12-31: 30 mL via PERINEURAL

## 2020-12-31 MED ORDER — ASPIRIN EC 81 MG PO TBEC: 81.0000 mg | DELAYED_RELEASE_TABLET | Freq: Two times a day (BID) | ORAL | 0 refills | Status: DC

## 2020-12-31 MED ORDER — OMEPRAZOLE MAGNESIUM 20 MG PO TBEC: 20.0000 mg | DELAYED_RELEASE_TABLET | Freq: Every day | ORAL | 0 refills | Status: DC

## 2020-12-31 MED ORDER — FENTANYL CITRATE (PF) 100 MCG/2ML IJ SOLN
INTRAMUSCULAR | Status: AC
  Filled 2020-12-31: qty 2

## 2020-12-31 MED ORDER — PROPOFOL 10 MG/ML IV BOLUS
INTRAVENOUS | Status: AC
  Filled 2020-12-31: qty 40

## 2020-12-31 MED ORDER — BISACODYL 10 MG RE SUPP
10.0000 mg | Freq: Every day | RECTAL | Status: DC | PRN
  Administered 2021-01-02: 10 mg via RECTAL
  Filled 2020-12-31: qty 1

## 2020-12-31 MED ORDER — STERILE WATER FOR IRRIGATION IR SOLN
Status: DC | PRN
  Administered 2020-12-31: 2000 mL

## 2020-12-31 MED ORDER — LACTATED RINGERS IV BOLUS
500.0000 mL | Freq: Once | INTRAVENOUS | Status: AC
  Administered 2020-12-31: 500 mL via INTRAVENOUS

## 2020-12-31 MED ORDER — IBUPROFEN 800 MG PO TABS: 800.0000 mg | ORAL_TABLET | Freq: Three times a day (TID) | ORAL | 0 refills | Status: DC | PRN

## 2020-12-31 MED ORDER — OXYCODONE HCL 5 MG PO TABS
5.0000 mg | ORAL_TABLET | Freq: Once | ORAL | Status: AC | PRN
  Administered 2020-12-31: 5 mg via ORAL

## 2020-12-31 MED ORDER — ACETAMINOPHEN 500 MG PO TABS
1000.0000 mg | ORAL_TABLET | Freq: Four times a day (QID) | ORAL | Status: AC
  Administered 2020-12-31 – 2021-01-01 (×4): 1000 mg via ORAL
  Filled 2020-12-31 (×4): qty 2

## 2020-12-31 MED ORDER — ORAL CARE MOUTH RINSE: 15.0000 mL | Freq: Once | OROMUCOSAL | Status: DC

## 2020-12-31 MED ORDER — MENTHOL 3 MG MT LOZG: 1.0000 | LOZENGE | OROMUCOSAL | Status: DC | PRN

## 2020-12-31 MED ORDER — ONDANSETRON HCL 4 MG/2ML IJ SOLN: 4.0000 mg | Freq: Four times a day (QID) | INTRAMUSCULAR | Status: DC | PRN

## 2020-12-31 MED ORDER — SODIUM CHLORIDE 0.9 % IR SOLN
Status: DC | PRN
  Administered 2020-12-31: 1000 mL

## 2020-12-31 MED ORDER — OXYCODONE HCL 5 MG PO TABS
10.0000 mg | ORAL_TABLET | ORAL | Status: DC | PRN
  Administered 2021-01-01: 15 mg via ORAL
  Administered 2021-01-02 (×2): 10 mg via ORAL
  Administered 2021-01-02: 15 mg via ORAL
  Filled 2020-12-31: qty 3
  Filled 2020-12-31: qty 2
  Filled 2020-12-31: qty 3

## 2020-12-31 MED ORDER — HYDROMORPHONE HCL 1 MG/ML IJ SOLN
INTRAMUSCULAR | Status: AC
  Filled 2020-12-31: qty 1

## 2020-12-31 MED ORDER — ASPIRIN 81 MG PO CHEW
81.0000 mg | CHEWABLE_TABLET | Freq: Two times a day (BID) | ORAL | Status: DC
  Administered 2020-12-31 – 2021-01-02 (×4): 81 mg via ORAL
  Filled 2020-12-31 (×4): qty 1

## 2020-12-31 MED ORDER — DEXAMETHASONE SODIUM PHOSPHATE 10 MG/ML IJ SOLN
INTRAMUSCULAR | Status: AC
  Filled 2020-12-31: qty 1

## 2020-12-31 MED ORDER — OXYCODONE HCL 5 MG PO TABS
5.0000 mg | ORAL_TABLET | Freq: Four times a day (QID) | ORAL | 0 refills | Status: DC | PRN
Start: 2020-12-31 — End: 2021-03-05

## 2020-12-31 MED ORDER — OXYCODONE HCL 5 MG PO TABS
ORAL_TABLET | ORAL | Status: AC
  Filled 2020-12-31: qty 2

## 2020-12-31 MED ORDER — ONDANSETRON HCL 4 MG/2ML IJ SOLN
INTRAMUSCULAR | Status: DC | PRN
  Administered 2020-12-31: 4 mg via INTRAVENOUS

## 2020-12-31 MED ORDER — 0.9 % SODIUM CHLORIDE (POUR BTL) OPTIME
TOPICAL | Status: DC | PRN
  Administered 2020-12-31: 1000 mL

## 2020-12-31 MED ORDER — METHOCARBAMOL 500 MG PO TABS
500.0000 mg | ORAL_TABLET | Freq: Four times a day (QID) | ORAL | Status: DC | PRN
  Administered 2021-01-02: 500 mg via ORAL
  Filled 2020-12-31: qty 1

## 2020-12-31 MED ORDER — ONDANSETRON HCL 4 MG PO TABS: 4.0000 mg | ORAL_TABLET | Freq: Four times a day (QID) | ORAL | Status: DC | PRN

## 2020-12-31 MED ORDER — SODIUM CHLORIDE 0.9 % IV BOLUS
500.0000 mL | Freq: Once | INTRAVENOUS | Status: AC
  Administered 2020-12-31: 500 mL via INTRAVENOUS

## 2020-12-31 MED ORDER — FENTANYL CITRATE (PF) 100 MCG/2ML IJ SOLN
INTRAMUSCULAR | Status: DC | PRN
  Administered 2020-12-31 (×3): 50 ug via INTRAVENOUS
  Administered 2020-12-31: 25 ug via INTRAVENOUS
  Administered 2020-12-31: 100 ug via INTRAVENOUS
  Administered 2020-12-31 (×2): 50 ug via INTRAVENOUS

## 2020-12-31 MED ORDER — ONDANSETRON HCL 4 MG/2ML IJ SOLN
INTRAMUSCULAR | Status: AC
  Filled 2020-12-31: qty 2

## 2020-12-31 MED ORDER — MIDAZOLAM HCL 5 MG/5ML IJ SOLN
INTRAMUSCULAR | Status: DC | PRN
  Administered 2020-12-31: 2 mg via INTRAVENOUS

## 2020-12-31 MED ORDER — POTASSIUM CHLORIDE IN NACL 20-0.9 MEQ/L-% IV SOLN
INTRAVENOUS | Status: DC
  Filled 2020-12-31 (×5): qty 1000

## 2020-12-31 MED ORDER — ALUM & MAG HYDROXIDE-SIMETH 200-200-20 MG/5ML PO SUSP
30.0000 mL | ORAL | Status: DC | PRN
  Administered 2021-01-02: 30 mL via ORAL
  Filled 2020-12-31: qty 30

## 2020-12-31 MED ORDER — PROPOFOL 10 MG/ML IV BOLUS
INTRAVENOUS | Status: DC | PRN
  Administered 2020-12-31: 200 mg via INTRAVENOUS

## 2020-12-31 MED ORDER — OXYCODONE HCL 5 MG PO TABS
ORAL_TABLET | ORAL | Status: AC
  Filled 2020-12-31: qty 1

## 2020-12-31 MED ORDER — METHOCARBAMOL 500 MG IVPB - SIMPLE MED
500.0000 mg | Freq: Four times a day (QID) | INTRAVENOUS | Status: DC | PRN
  Administered 2020-12-31: 500 mg via INTRAVENOUS
  Filled 2020-12-31 (×2): qty 50

## 2020-12-31 MED ORDER — BUPIVACAINE LIPOSOME 1.3 % IJ SUSP
20.0000 mL | Freq: Once | INTRAMUSCULAR | Status: AC
  Administered 2020-12-31: 20 mL
  Filled 2020-12-31: qty 20

## 2020-12-31 MED ORDER — OXYCODONE HCL 5 MG PO TABS
5.0000 mg | ORAL_TABLET | ORAL | Status: DC | PRN
  Administered 2020-12-31: 10 mg via ORAL
  Administered 2020-12-31: 5 mg via ORAL
  Administered 2021-01-01: 10 mg via ORAL
  Filled 2020-12-31: qty 1
  Filled 2020-12-31 (×2): qty 2

## 2020-12-31 MED ORDER — LACTATED RINGERS IV SOLN: INTRAVENOUS | Status: DC

## 2020-12-31 MED ORDER — DIPHENHYDRAMINE HCL 12.5 MG/5ML PO ELIX
12.5000 mg | ORAL_SOLUTION | ORAL | Status: DC | PRN
Start: 2020-12-31 — End: 2021-01-02
  Administered 2020-12-31 – 2021-01-02 (×3): 25 mg via ORAL
  Filled 2020-12-31 (×3): qty 10

## 2020-12-31 MED ORDER — DEXAMETHASONE SODIUM PHOSPHATE 10 MG/ML IJ SOLN
10.0000 mg | Freq: Once | INTRAMUSCULAR | Status: AC
  Administered 2021-01-01: 10 mg via INTRAVENOUS
  Filled 2020-12-31: qty 1

## 2020-12-31 MED ORDER — POLYETHYLENE GLYCOL 3350 17 G PO PACK
17.0000 g | PACK | Freq: Every day | ORAL | Status: DC | PRN
  Administered 2021-01-02: 17 g via ORAL
  Filled 2020-12-31: qty 1

## 2020-12-31 MED ORDER — ACETAMINOPHEN 325 MG PO TABS
325.0000 mg | ORAL_TABLET | Freq: Four times a day (QID) | ORAL | Status: DC | PRN
  Administered 2021-01-01: 325 mg via ORAL
  Administered 2021-01-01 – 2021-01-02 (×2): 650 mg via ORAL
  Filled 2020-12-31 (×2): qty 2
  Filled 2020-12-31: qty 1

## 2020-12-31 MED ORDER — SODIUM CHLORIDE 0.9 % IV SOLN: INTRAVENOUS | Status: DC

## 2020-12-31 MED ORDER — DEXMEDETOMIDINE (PRECEDEX) IN NS 20 MCG/5ML (4 MCG/ML) IV SYRINGE
PREFILLED_SYRINGE | INTRAVENOUS | Status: DC | PRN
  Administered 2020-12-31: 8 ug via INTRAVENOUS

## 2020-12-31 MED ORDER — METOCLOPRAMIDE HCL 5 MG/ML IJ SOLN: 5.0000 mg | Freq: Three times a day (TID) | INTRAMUSCULAR | Status: DC | PRN

## 2020-12-31 MED ORDER — LACTATED RINGERS IV BOLUS
250.0000 mL | Freq: Once | INTRAVENOUS | Status: AC
  Administered 2020-12-31: 250 mL via INTRAVENOUS

## 2020-12-31 MED ORDER — PANTOPRAZOLE SODIUM 40 MG PO TBEC
40.0000 mg | DELAYED_RELEASE_TABLET | Freq: Every day | ORAL | Status: DC
  Administered 2020-12-31 – 2021-01-02 (×3): 40 mg via ORAL
  Filled 2020-12-31 (×3): qty 1

## 2020-12-31 MED ORDER — HYDROMORPHONE HCL 1 MG/ML IJ SOLN
0.2500 mg | INTRAMUSCULAR | Status: DC | PRN
Start: 2020-12-31 — End: 2020-12-31
  Administered 2020-12-31 (×4): 0.5 mg via INTRAVENOUS

## 2020-12-31 MED ORDER — METHOCARBAMOL 500 MG IVPB - SIMPLE MED
INTRAVENOUS | Status: AC
  Filled 2020-12-31: qty 50

## 2020-12-31 MED ORDER — MIDAZOLAM HCL 2 MG/2ML IJ SOLN
INTRAMUSCULAR | Status: AC
  Filled 2020-12-31: qty 2

## 2020-12-31 MED ORDER — LIDOCAINE HCL (CARDIAC) PF 100 MG/5ML IV SOSY
PREFILLED_SYRINGE | INTRAVENOUS | Status: DC | PRN
  Administered 2020-12-31: 40 mg via INTRATRACHEAL

## 2020-12-31 MED ORDER — CEFAZOLIN SODIUM-DEXTROSE 2-4 GM/100ML-% IV SOLN
2.0000 g | INTRAVENOUS | Status: AC
  Administered 2020-12-31: 2 g via INTRAVENOUS
  Filled 2020-12-31: qty 100

## 2020-12-31 MED ORDER — PHENYLEPHRINE 40 MCG/ML (10ML) SYRINGE FOR IV PUSH (FOR BLOOD PRESSURE SUPPORT)
PREFILLED_SYRINGE | INTRAVENOUS | Status: DC | PRN
  Administered 2020-12-31 (×5): 80 ug via INTRAVENOUS
  Administered 2020-12-31: 120 ug via INTRAVENOUS
  Administered 2020-12-31: 80 ug via INTRAVENOUS

## 2020-12-31 MED ORDER — DEXMEDETOMIDINE (PRECEDEX) IN NS 20 MCG/5ML (4 MCG/ML) IV SYRINGE
PREFILLED_SYRINGE | INTRAVENOUS | Status: AC
  Filled 2020-12-31: qty 5

## 2020-12-31 MED ORDER — TRANEXAMIC ACID-NACL 1000-0.7 MG/100ML-% IV SOLN
1000.0000 mg | Freq: Once | INTRAVENOUS | Status: AC
  Administered 2020-12-31: 1000 mg via INTRAVENOUS
  Filled 2020-12-31: qty 100

## 2020-12-31 MED ORDER — TRANEXAMIC ACID-NACL 1000-0.7 MG/100ML-% IV SOLN
1000.0000 mg | INTRAVENOUS | Status: AC
  Administered 2020-12-31: 1000 mg via INTRAVENOUS
  Filled 2020-12-31: qty 100

## 2020-12-31 MED ORDER — SODIUM CHLORIDE 0.9% FLUSH
INTRAVENOUS | Status: DC | PRN
  Administered 2020-12-31: 30 mL

## 2020-12-31 MED ORDER — HYDROMORPHONE HCL 1 MG/ML IJ SOLN
0.5000 mg | INTRAMUSCULAR | Status: DC | PRN
Start: 2020-12-31 — End: 2021-01-02
  Administered 2020-12-31 – 2021-01-02 (×4): 1 mg via INTRAVENOUS
  Filled 2020-12-31 (×4): qty 1

## 2020-12-31 MED ORDER — CHLORHEXIDINE GLUCONATE 0.12 % MT SOLN: 15.0000 mL | Freq: Once | OROMUCOSAL | Status: DC

## 2020-12-31 MED ORDER — OXYCODONE HCL 5 MG/5ML PO SOLN
5.0000 mg | Freq: Once | ORAL | Status: AC | PRN
Start: 2020-12-31 — End: 2020-12-31

## 2020-12-31 MED ORDER — PROMETHAZINE HCL 25 MG/ML IJ SOLN
6.2500 mg | INTRAMUSCULAR | Status: DC | PRN
Start: 2020-12-31 — End: 2020-12-31

## 2020-12-31 MED ORDER — MAGNESIUM CITRATE PO SOLN: 1.0000 | Freq: Once | ORAL | Status: DC | PRN

## 2020-12-31 SURGICAL SUPPLY — 53 items
BLADE HEX COATED 2.75 (ELECTRODE) ×2 IMPLANT
BLADE SAG 18X100X1.27 (BLADE) ×2 IMPLANT
BLADE SAGITTAL 25.0X1.37X90 (BLADE) ×2 IMPLANT
BLADE SURG 15 STRL LF DISP TIS (BLADE) ×1 IMPLANT
BLADE SURG 15 STRL SS (BLADE) ×2
BLADE SURG SZ10 CARB STEEL (BLADE) ×4 IMPLANT
BNDG CMPR MED 10X6 ELC LF (GAUZE/BANDAGES/DRESSINGS) ×1
BNDG ELASTIC 6X10 VLCR STRL LF (GAUZE/BANDAGES/DRESSINGS) ×2 IMPLANT
BOWL SMART MIX CTS (DISPOSABLE) IMPLANT
BSPLAT TIB 5 KN TRITANIUM (Knees) ×1 IMPLANT
CLSR STERI-STRIP ANTIMIC 1/2X4 (GAUZE/BANDAGES/DRESSINGS) ×4 IMPLANT
COVER SURGICAL LIGHT HANDLE (MISCELLANEOUS) ×2 IMPLANT
COVER WAND RF STERILE (DRAPES) ×2 IMPLANT
CUFF TOURN SGL QUICK 34 (TOURNIQUET CUFF) ×2
CUFF TRNQT CYL 34X4.125X (TOURNIQUET CUFF) ×1 IMPLANT
DECANTER SPIKE VIAL GLASS SM (MISCELLANEOUS) ×2 IMPLANT
DRAPE U-SHAPE 47X51 STRL (DRAPES) ×2 IMPLANT
DRSG AQUACEL AG ADV 3.5X10 (GAUZE/BANDAGES/DRESSINGS) ×2 IMPLANT
DRSG MEPILEX BORDER 4X12 (GAUZE/BANDAGES/DRESSINGS) ×2 IMPLANT
DURAPREP 26ML APPLICATOR (WOUND CARE) ×4 IMPLANT
FEMORAL POSTERIOR SZ5 LFT (Femur) ×1 IMPLANT
GLOVE SRG 8 PF TXTR STRL LF DI (GLOVE) ×1 IMPLANT
GLOVE SURG ENC MOIS LTX SZ7.5 (GLOVE) ×4 IMPLANT
GLOVE SURG UNDER POLY LF SZ7.5 (GLOVE) ×2 IMPLANT
GLOVE SURG UNDER POLY LF SZ8 (GLOVE) ×2
GOWN STRL REUS W/ TWL LRG LVL3 (GOWN DISPOSABLE) ×2 IMPLANT
GOWN STRL REUS W/TWL LRG LVL3 (GOWN DISPOSABLE) ×4
HANDPIECE INTERPULSE COAX TIP (DISPOSABLE) ×2
HOLDER FOLEY CATH W/STRAP (MISCELLANEOUS) ×2 IMPLANT
IMMOBILIZER KNEE 20 (SOFTGOODS) ×2
IMMOBILIZER KNEE 20 THIGH 36 (SOFTGOODS) ×1 IMPLANT
IMMOBILIZER KNEE 22 UNIV (SOFTGOODS) ×2 IMPLANT
INSERT TIB BEARING PS 5 9 (Miscellaneous) ×2 IMPLANT
KIT TURNOVER KIT A (KITS) ×2 IMPLANT
KNEE PATELLA ASYMMETRIC 10X32 (Knees) ×2 IMPLANT
KNEE TIBIAL COMPONENT SZ5 (Knees) ×2 IMPLANT
MANIFOLD NEPTUNE II (INSTRUMENTS) ×2 IMPLANT
NS IRRIG 1000ML POUR BTL (IV SOLUTION) ×2 IMPLANT
PACK ICE MAXI GEL EZY WRAP (MISCELLANEOUS) ×2 IMPLANT
PACK TOTAL KNEE CUSTOM (KITS) ×2 IMPLANT
PENCIL SMOKE EVACUATOR (MISCELLANEOUS) IMPLANT
PIN FLUTED HEDLESS FIX 3.5X1/8 (PIN) ×2 IMPLANT
POSTERIOR FEMORAL SZ5 LFT (Femur) ×2 IMPLANT
PROTECTOR NERVE ULNAR (MISCELLANEOUS) ×2 IMPLANT
SET HNDPC FAN SPRY TIP SCT (DISPOSABLE) ×1 IMPLANT
SUT MNCRL AB 3-0 PS2 18 (SUTURE) ×2 IMPLANT
SUT VIC AB 0 CT1 36 (SUTURE) ×2 IMPLANT
SUT VIC AB 1 CT1 36 (SUTURE) ×4 IMPLANT
SUT VIC AB 2-0 CT1 27 (SUTURE) ×2
SUT VIC AB 2-0 CT1 TAPERPNT 27 (SUTURE) ×1 IMPLANT
TRAY FOLEY MTR SLVR 16FR STAT (SET/KITS/TRAYS/PACK) ×2 IMPLANT
TUBE SUCTION HIGH CAP CLEAR NV (SUCTIONS) ×2 IMPLANT
WRAP KNEE MAXI GEL POST OP (GAUZE/BANDAGES/DRESSINGS) ×2 IMPLANT

## 2020-12-31 NOTE — Anesthesia Postprocedure Evaluation (Signed)
Anesthesia Post Note  Patient: Bradley Ray  Procedure(s) Performed: TOTAL KNEE ARTHROPLASTY (Left Knee)     Patient location during evaluation: PACU Anesthesia Type: Regional and General Level of consciousness: awake Pain management: pain level controlled Vital Signs Assessment: post-procedure vital signs reviewed and stable Respiratory status: spontaneous breathing, nonlabored ventilation, respiratory function stable and patient connected to nasal cannula oxygen Cardiovascular status: blood pressure returned to baseline and stable Postop Assessment: no apparent nausea or vomiting Anesthetic complications: no   No complications documented.  Last Vitals:  Vitals:   12/31/20 1820 12/31/20 1949  BP: (!) 156/95 139/86  Pulse: (!) 124 (!) 119  Resp: 17 20  Temp: 37.3 C 36.7 C  SpO2: 96% 92%    Last Pain:  Vitals:   12/31/20 1949  TempSrc: Oral  PainSc:                  Bradley Ray

## 2020-12-31 NOTE — Anesthesia Procedure Notes (Signed)
Anesthesia Regional Block: Adductor canal block   Pre-Anesthetic Checklist: ,, timeout performed, Correct Patient, Correct Site, Correct Laterality, Correct Procedure,, site marked, risks and benefits discussed, Surgical consent,  Pre-op evaluation,  At surgeon's request and post-op pain management  Laterality: Left  Prep: chloraprep       Needles:  Injection technique: Single-shot  Needle Type: Echogenic Stimulator Needle     Needle Length: 10cm  Needle Gauge: 20     Additional Needles:   Procedures:,,,, ultrasound used (permanent image in chart),,,,  Narrative:  Start time: 12/31/2020 6:50 AM End time: 12/31/2020 7:00 AM Injection made incrementally with aspirations every 5 mL.  Performed by: Personally  Anesthesiologist: Murvin Natal, MD  Additional Notes: Functioning IV was confirmed and monitors were applied. A time-out was performed. Hand hygiene and sterile gloves were used. The thigh was placed in a frog-leg position and prepped in a sterile fashion. A 186mm 20ga BBraun echogenic stimulator needle was placed using ultrasound guidance.  Negative aspiration and negative test dose prior to incremental administration of local anesthetic. The patient tolerated the procedure well.

## 2020-12-31 NOTE — Op Note (Signed)
DATE OF SURGERY:  12/31/2020 TIME: 8:42 AM  PATIENT NAME:  Bradley Ray   AGE: 65 y.o.    PRE-OPERATIVE DIAGNOSIS:  DEGERATIVE JOINE DISEASELEFT KNEE  POST-OPERATIVE DIAGNOSIS:  Same  PROCEDURE:  Procedure(s): TOTAL KNEE ARTHROPLASTY   SURGEON:  [name], MD   ASSISTANT:  Aggie Moats, PA-C, he was present and scrubbed throughout the case, critical for completion in a timely fashion, and for retraction, instrumentation, and closure.    OPERATIVE IMPLANTS: Stryker Triathlon Posterior Stabilized. Press fit knee  Femur size 5, Tibia size 5, Patella size 32 3-peg oval button, with a 9 mm polyethylene insert.   PREOPERATIVE INDICATIONS:  SARVESH MEDDAUGH is a 65 y.o. year old male with end stage bone on bone degenerative arthritis of the knee who failed conservative treatment, including injections, antiinflammatories, activity modification, and assistive devices, and had significant impairment of their activities of daily living, and elected for Total Knee Arthroplasty.   The risks, benefits, and alternatives were discussed at length including but not limited to the risks of infection, bleeding, nerve injury, stiffness, blood clots, the need for revision surgery, cardiopulmonary complications, among others, and they were willing to proceed.   OPERATIVE DESCRIPTION:  The patient was brought to the operative room and placed in a supine position.  General anesthesia was administered.  IV antibiotics were given.  The lower extremity was prepped and draped in the usual sterile fashion.  Time out was performed.  The leg was elevated and exsanguinated and the tourniquet was inflated.  Anterior approach was performed.  The patella was everted and osteophytes were removed.  The anterior horn of the medial and lateral meniscus was removed.   The distal femur was opened with the drill and the intramedullary distal femoral cutting jig was utilized, set at 5 degrees resecting 8 mm off the  distal femur.  Care was taken to protect the collateral ligaments.  The distal femoral sizing jig was applied, taking care to avoid notching.  Then the 4-in-1 cutting jig was applied and the anterior and posterior femur was cut, along with the chamfer cuts.  All posterior osteophytes were removed.  The flexion gap was then measured and was symmetric with the extension gap.  Then the extramedullary tibial cutting jig was utilized making the appropriate cut using the anterior tibial crest as a reference building in appropriate posterior slope.  Care was taken during the cut to protect the medial and collateral ligaments.  The proximal tibia was removed along with the posterior horns of the menisci.  The PCL was sacrificed.    The extensor gap was measured and was approximately 37mm.    I completed the distal femoral preparation using the appropriate jig to prepare the box.  The patella was then measured, and cut with the saw.    The proximal tibia sized and prepared accordingly with the reamer and the punch, and then all components were trialed with the above sized poly insert.  The knee was found to have excellent balance and full motion.    The above named components were then impacted into place and Poly tibial piece and patella were inserted.  I was very happy with his stability and ROM  I performed a periarticular injection with marcaine and toradol  The knee was easily taken through a range of motion and the patella tracked well and the knee irrigated copiously and the parapatellar and subcutaneous tissue closed with vicryl, and monocryl with steri strips for the skin.  The incision was dressed with sterile gauze and the tourniquet released and the patient was awakened and returned to the PACU in stable and satisfactory condition.  There were no complications.  Total tourniquet time was roughly 60 minutes.   POSTOPERATIVE PLAN: post op Abx, DVT px: SCD's, TED's, Early ambulation and  chemical px

## 2020-12-31 NOTE — Interval H&P Note (Signed)
History and Physical Interval Note:  12/31/2020 7:05 AM  Bradley Ray  has presented today for surgery, with the diagnosis of DEGERATIVE JOINE DISEASELEFT KNEE.  The various methods of treatment have been discussed with the patient and family. After consideration of risks, benefits and other options for treatment, the patient has consented to  Procedure(s): TOTAL KNEE ARTHROPLASTY (Left) as a surgical intervention.  The patient's history has been reviewed, patient examined, no change in status, stable for surgery.  I have reviewed the patient's chart and labs.  Questions were answered to the patient's satisfaction.     [name]

## 2020-12-31 NOTE — Evaluation (Signed)
Physical Therapy Evaluation Patient Details Name: Bradley Ray MRN: [id number] DOB: [birth year] Today's Date: 12/31/2020   History of Present Illness  Patient is a 65 y.o. male s/p Lt TKA on 12/31/2020 with PMH significant for prostate cancer, HTN, HLD, Covid, CKD, asthma, OA, alcohol/drug abuse, ORIF Lt ankle (1980's).    Clinical Impression  Bradley Ray is a 65 y.o. male POD 0 s/p Lt TKA. Patient reports independence with mobility at baseline. Patient is now limited by functional impairments (see PT problem list below) and requires min assist/guard for transfers and gait with RW. Patient was able to ambulate ~70 feet with RW and min assist. Patient was limited this session by pain and HR elevated to max of 145 bpm. Pt also noted to have full body tremors. Pt denied chest pain, SOB, dizziness throughout. He reports he does not have anyone to stay with him and assist with safety for mobility tonight. He is unsfe to discharge home alone at this time. Patient will benefit from continued skilled PT interventions to address impairments and progress towards PLOF. Acute PT will follow to progress mobility and stair training in preparation for safe discharge home.     Follow Up Recommendations Follow surgeon's recommendation for DC plan and follow-up therapies;Home health PT    Equipment Recommendations  3in1 (PT)    Recommendations for Other Services       Precautions / Restrictions Precautions Precautions: Fall Restrictions Weight Bearing Restrictions: No Other Position/Activity Restrictions: WBAT      Mobility  Bed Mobility Overal bed mobility: (P) Needs Assistance Bed Mobility: (P) Supine to Sit     Supine to sit: Min assist     General bed mobility comments: pt initiated moving to EOB but required assist to bring Lt LE fully off EOB    Transfers Overall transfer level: Needs assistance Equipment used: Rolling walker (2 wheeled) Transfers: Sit to/from Stand Sit to Stand:  Min guard         General transfer comment: pt using bil UE for power up and avoiding weight bearing on Lt LE due to pain. guarding for safety with rise.  Ambulation/Gait Ambulation/Gait assistance: Min assist;Min guard Gait Distance (Feet): 70 Feet Assistive device: Rolling walker (2 wheeled) Gait Pattern/deviations: Step-to pattern;Decreased stride length;Decreased stance time - left;Decreased weight shift to left;Antalgic Gait velocity: decr   General Gait Details: pt limited by pain and heavily reliant on UE use to reduce weight bearing on Lt LE. cues and assist for step to pattern and to maintain safe proximity to RW.  Stairs            Wheelchair Mobility    Modified Rankin (Stroke Patients Only)       Balance Overall balance assessment: Needs assistance Sitting-balance support: Feet supported Sitting balance-Leahy Scale: Good     Standing balance support: Bilateral upper extremity supported;During functional activity Standing balance-Leahy Scale: Poor Standing balance comment: ehavy reliance on RW for support                             Pertinent Vitals/Pain Pain Assessment: (P) 0-10 Pain Score: 8  Pain Location: Lt knee Pain Descriptors / Indicators: Aching;Discomfort;Grimacing (tremors/shaking) Pain Intervention(s): Limited activity within patient's tolerance;Monitored during session;Repositioned    Home Living Family/patient expects to be discharged to:: Private residence (public housing) Living Arrangements: (P) Alone Available Help at Discharge: (P) Friend(s) Type of Home: Apartment Home Access: (P) Elevator  Home Layout: (P) One level Home Equipment: (P) Walker - 2 wheels Additional Comments: friend physically able however pt is unsurehow reliable his friends and family will be with providing assistance. his sister works and his daughters lives farther away and works.    Prior Function Level of Independence: (P) Independent                Hand Dominance        Extremity/Trunk Assessment   Upper Extremity Assessment Upper Extremity Assessment: Overall WFL for tasks assessed    Lower Extremity Assessment Lower Extremity Assessment: LLE deficits/detail LLE Deficits / Details: good quad acitvation but unable to complete SLR due to pain, 4/5 for dorsi/plantar flexion bil ankles LLE: Unable to fully assess due to pain LLE Sensation: WNL LLE Coordination: WNL    Cervical / Trunk Assessment Cervical / Trunk Assessment: Normal  Communication   Communication: No difficulties  Cognition Arousal/Alertness: Awake/alert Behavior During Therapy: WFL for tasks assessed/performed Overall Cognitive Status: Within Functional Limits for tasks assessed                                        General Comments      Exercises     Assessment/Plan    PT Assessment Patient needs continued PT services  PT Problem List Decreased strength;Decreased range of motion;Decreased activity tolerance;Decreased balance;Decreased mobility;Decreased knowledge of use of DME;Decreased safety awareness;Pain       PT Treatment Interventions DME instruction;Gait training;Stair training;Functional mobility training;Therapeutic activities;Therapeutic exercise;Balance training;Patient/family education    PT Goals (Current goals can be found in the Care Plan section)  Acute Rehab PT Goals Patient Stated Goal: find help to recover and knee to stop hurting so badly PT Goal Formulation: With patient Time For Goal Achievement: 01/07/21 Potential to Achieve Goals: Good    Frequency 7X/week   Barriers to discharge Decreased caregiver support pt unsure of which family/friends can assist him while he recovers    Co-evaluation               AM-PAC PT "6 Clicks" Mobility  Outcome Measure Help needed turning from your back to your side while in a flat bed without using bedrails?: A Little Help needed moving from  lying on your back to sitting on the side of a flat bed without using bedrails?: A Little Help needed moving to and from a bed to a chair (including a wheelchair)?: A Little Help needed standing up from a chair using your arms (e.g., wheelchair or bedside chair)?: A Little Help needed to walk in hospital room?: A Little Help needed climbing 3-5 steps with a railing? : A Lot 6 Click Score: 17    End of Session Equipment Utilized During Treatment: Gait belt Activity Tolerance: Patient limited by pain Patient left: in bed;with call bell/phone within reach Nurse Communication: Mobility status;Patient requests pain meds PT Visit Diagnosis: Muscle weakness (generalized) (M62.81);Difficulty in walking, not elsewhere classified (R26.2);Pain Pain - Right/Left: Left Pain - part of body: Knee    Time: 7902-4097 PT Time Calculation (min) (ACUTE ONLY): 45 min   Charges:   PT Evaluation $PT Eval Low Complexity: 1 Low          Verner Mould, DPT Acute Rehabilitation Services Office [phone] Pager [phone]    Jacques Navy 12/31/2020, 2:42 PM

## 2020-12-31 NOTE — Progress Notes (Signed)
Subjective: Was notified by patient's RN that his pulse and BP had been running high and his O2 sat was running in the 80s on room air. Placed patient on O2 via [place] and sat went up to high 90s. Ordered EKG that showed sinus tachycardia, possible LAE, and inferior infarct. I looked back at 3 prior EKGs from 2019-2021 and none of them showed that. I had to nurse order a rapid response and paged cardiology. I ordered a troponin series and a 500cc fluid bolus of NS and headed to the hospital.   When rapid response arrived, they redid another EKG which showed the same as the first one.   When I arrived to the patient's bedside, he was sitting up in bed smiling and talking. He denies any CP, chest tightness, SOB, abd pain, N/V, headache, dizziness, anxiety. He says his pain is currently well controlled. He denies a PMH of cardiac problems other than HTN for which he takes Amlodipine, last dose was this morning before surgery. He has an extensive FHx of cardiac issues including heart disease and heart attacks. He has never seen a cardiologist himself though. Denies any previous cardiac events.   Objective:   VITALS:   Vitals:   12/31/20 1500 12/31/20 1600 12/31/20 1630 12/31/20 1820  BP: (!) 146/92 (!) 142/94 (!) 151/96 (!) 156/95  Pulse: (!) 104 (!) 104 (!) 117 (!) 124  Resp: 16 16 18 17   Temp:  98.4 F (36.9 C) 99.3 F (37.4 C) 99.2 F (37.3 C)  TempSrc:   Oral Oral  SpO2: 98% 98% 98% 96%  Weight:      Height:       CBC Latest Ref Rng & Units 12/31/2020 12/25/2020 08/01/2020  WBC 4.0 - 10.5 K/uL 9.0 6.6 -  Hemoglobin 13.0 - 17.0 g/dL 12.1(L) 13.0 15.3  Hematocrit 39.0 - 52.0 % 39.2 42.3 45.0  Platelets 150 - 400 K/uL 196 269 -   BMP Latest Ref Rng & Units 12/25/2020 08/01/2020 09/27/2019  Glucose 70 - 99 mg/dL 104(H) 92 179(H)  BUN 8 - 23 mg/dL 14 14 10   Creatinine 0.61 - 1.24 mg/dL 0.91 0.80 0.97  Sodium 135 - 145 mmol/L 139 143 140  Potassium 3.5 - 5.1 mmol/L 3.6 3.6 2.8(L)   Chloride 98 - 111 mmol/L 105 104 100  CO2 22 - 32 mmol/L 24 - 25  Calcium 8.9 - 10.3 mg/dL 9.5 - 9.1   Intake/Output      03/21 0701 03/22 0700 03/22 0701 03/23 0700   I.V. (mL/kg)  1500 (18.6)   IV Piggyback  200   Total Intake(mL/kg)  1700 (21.1)   Urine (mL/kg/hr)  350 (0.4)   Total Output  350   Net  +1350           Physical Exam: General: NAD.  Sitting up in bed, conversant, calm. Smiling Resp: No increased wob Cardio: sinus tachycardia ABD soft, NT Neurologically intact MSK Neurovascularly intact Sensation intact distally Intact pulses distally Dorsiflexion/Plantar flexion intact Incision: dressing C/D/I ACE wrap around LLE Able to slightly perform straight leg raise  Assessment: Day of Surgery  S/P Procedure(s) (LRB): TOTAL KNEE ARTHROPLASTY (Left) by Dr. Ernesta Amble. Murphy on 12/31/20  Active Problems:   S/P total knee arthroplasty, left   Plan: Dr. Percell Davtyan aware of this situation. He spoke with cardiology and shared the EKG image with them. They will come see the patient in the AM. He told me to also order a consult for a  hospitalist to see patient in AM as well.  I have placed the patient on telemetry and ordered a bed for him to be transferred to the telemetry unit when available.  Serial troponin ordered NS with KCl 120ml/hr continuous ordered Ordered BMP to check kidney status. Will recheck in AM. CBC ordered for AM Advance diet Up with therapy Incentive Spirometry Elevate and Apply ice    Weightbearing: WBAT LLE Insicional and dressing care: Dressings left intact until follow-up and Reinforce dressings as needed Orthopedic device(s): None Showering: Keep dressing dry VTE prophylaxis: Aspirin 81mg  BID for 30 days post op, SCDs, ambulation Pain control: Continue current regimen Follow - up plan: 1 week in the office Contact information:  Call answering service at [phone] to reach on call provider who has been updated on this patient's  status  Dispo: TBD based on evals from cardiology, hospitalist, and PT/OT. Had been planning to go home with OPPT prior to this event.    Britt Bottom, PA-C Office [phone] 12/31/2020, 6:32 PM

## 2020-12-31 NOTE — Anesthesia Procedure Notes (Signed)
Procedure Name: LMA Insertion Date/Time: 12/31/2020 7:16 AM Performed by: West Pugh, CRNA Pre-anesthesia Checklist: Patient identified, Emergency Drugs available, Suction available, Patient being monitored and Timeout performed Patient Re-evaluated:Patient Re-evaluated prior to induction Oxygen Delivery Method: Circle system utilized Preoxygenation: Pre-oxygenation with 100% oxygen Induction Type: IV induction Ventilation: Mask ventilation without difficulty LMA: LMA with gastric port inserted LMA Size: 4.0 Number of attempts: 1 Placement Confirmation: positive ETCO2 and breath sounds checked- equal and bilateral Tube secured with: Tape Dental Injury: Teeth and Oropharynx as per pre-operative assessment

## 2020-12-31 NOTE — Discharge Instructions (Signed)

## 2020-12-31 NOTE — Transfer of Care (Signed)
Immediate Anesthesia Transfer of Care Note  Patient: Bradley Ray  Procedure(s) Performed: TOTAL KNEE ARTHROPLASTY (Left Knee)  Patient Location: PACU  Anesthesia Type:GA combined with regional for post-op pain  Level of Consciousness: awake, drowsy and patient cooperative  Airway & Oxygen Therapy: Patient Spontanous Breathing and Patient connected to face mask oxygen  Post-op Assessment: Report given to RN and Post -op Vital signs reviewed and stable  Post vital signs: Reviewed and stable  Last Vitals:  Vitals Value Taken Time  BP 151/89 12/31/20 0923  Temp    Pulse 101 12/31/20 0924  Resp 31 12/31/20 0927  SpO2 96 % 12/31/20 0924  Vitals shown include unvalidated device data.  Last Pain:  Vitals:   12/31/20 0622  TempSrc:   PainSc: 0-No pain      Patients Stated Pain Goal: 4 (79/39/68 8648)  Complications: No complications documented.

## 2020-12-31 NOTE — Significant Event (Signed)
Rapid Response Event Note   Reason for Call :  Tachycardia and changes in EKG  Initial Focused Assessment:  Alert x 4  Denies chest pain. VSS see flow sheet   Interventions:  Troponin X 1 EKG  MD Notified   Plan of Care:  PA at bedside.  Tele Orders.  Consult [facility]   MD Notified: Dr. Maretta Los  Call Time: [id number] Arrival Time: 1950 End Time: 1830  West Carbo Rube Sanchez, RN

## 2021-01-01 ENCOUNTER — Observation Stay (HOSPITAL_COMMUNITY): Payer: Medicaid Other

## 2021-01-01 ENCOUNTER — Encounter (HOSPITAL_COMMUNITY): Payer: Self-pay | Admitting: Orthopedic Surgery

## 2021-01-01 ENCOUNTER — Observation Stay (HOSPITAL_BASED_OUTPATIENT_CLINIC_OR_DEPARTMENT_OTHER): Payer: Medicaid Other

## 2021-01-01 DIAGNOSIS — R9431 Abnormal electrocardiogram [ECG] [EKG]: Secondary | ICD-10-CM

## 2021-01-01 DIAGNOSIS — E785 Hyperlipidemia, unspecified: Secondary | ICD-10-CM

## 2021-01-01 DIAGNOSIS — J9601 Acute respiratory failure with hypoxia: Secondary | ICD-10-CM

## 2021-01-01 DIAGNOSIS — R000 Tachycardia, unspecified: Secondary | ICD-10-CM

## 2021-01-01 DIAGNOSIS — I10 Essential (primary) hypertension: Secondary | ICD-10-CM | POA: Diagnosis not present

## 2021-01-01 DIAGNOSIS — I5032 Chronic diastolic (congestive) heart failure: Secondary | ICD-10-CM

## 2021-01-01 DIAGNOSIS — J45909 Unspecified asthma, uncomplicated: Secondary | ICD-10-CM | POA: Diagnosis not present

## 2021-01-01 DIAGNOSIS — M1712 Unilateral primary osteoarthritis, left knee: Secondary | ICD-10-CM | POA: Diagnosis not present

## 2021-01-01 DIAGNOSIS — Z96652 Presence of left artificial knee joint: Secondary | ICD-10-CM

## 2021-01-01 DIAGNOSIS — Z8616 Personal history of COVID-19: Secondary | ICD-10-CM | POA: Diagnosis not present

## 2021-01-01 LAB — CBC WITH DIFFERENTIAL/PLATELET
Abs Immature Granulocytes: 0.06 10*3/uL (ref 0.00–0.07)
Basophils Absolute: 0 10*3/uL (ref 0.0–0.1)
Basophils Relative: 0 %
Eosinophils Absolute: 0 10*3/uL (ref 0.0–0.5)
Eosinophils Relative: 0 %
HCT: 35 % — ABNORMAL LOW (ref 39.0–52.0)
Hemoglobin: 10.8 g/dL — ABNORMAL LOW (ref 13.0–17.0)
Immature Granulocytes: 0 %
Lymphocytes Relative: 11 %
Lymphs Abs: 1.5 10*3/uL (ref 0.7–4.0)
MCH: 20.1 pg — ABNORMAL LOW (ref 26.0–34.0)
MCHC: 30.9 g/dL (ref 30.0–36.0)
MCV: 65.3 fL — ABNORMAL LOW (ref 80.0–100.0)
Monocytes Absolute: 1.4 10*3/uL — ABNORMAL HIGH (ref 0.1–1.0)
Monocytes Relative: 10 %
Neutro Abs: 10.8 10*3/uL — ABNORMAL HIGH (ref 1.7–7.7)
Neutrophils Relative %: 79 %
Platelets: 161 10*3/uL (ref 150–400)
RBC: 5.36 MIL/uL (ref 4.22–5.81)
RDW: 16.8 % — ABNORMAL HIGH (ref 11.5–15.5)
WBC: 13.9 10*3/uL — ABNORMAL HIGH (ref 4.0–10.5)
nRBC: 0 % (ref 0.0–0.2)

## 2021-01-01 LAB — ECHOCARDIOGRAM COMPLETE
Area-P 1/2: 3.91 cm2
Height: 66 in
S' Lateral: 3.2 cm
Weight: 2847.99 oz

## 2021-01-01 LAB — BASIC METABOLIC PANEL
Anion gap: 10 (ref 5–15)
BUN: 6 mg/dL — ABNORMAL LOW (ref 8–23)
CO2: 22 mmol/L (ref 22–32)
Calcium: 8.4 mg/dL — ABNORMAL LOW (ref 8.9–10.3)
Chloride: 104 mmol/L (ref 98–111)
Creatinine, Ser: 0.74 mg/dL (ref 0.61–1.24)
GFR, Estimated: >60 mL/min (ref >60)
Glucose, Bld: 113 mg/dL — ABNORMAL HIGH (ref 70–99)
Potassium: 3.2 mmol/L — ABNORMAL LOW (ref 3.5–5.1)
Sodium: 136 mmol/L (ref 135–145)

## 2021-01-01 LAB — MAGNESIUM: Magnesium: 1.5 mg/dL — ABNORMAL LOW (ref 1.7–2.4)

## 2021-01-01 LAB — PHOSPHORUS: Phosphorus: 2.9 mg/dL (ref 2.5–4.6)

## 2021-01-01 LAB — HEMOGLOBIN A1C
Hgb A1c MFr Bld: 5.8 % — ABNORMAL HIGH (ref 4.8–5.6)
Mean Plasma Glucose: 119.76 mg/dL

## 2021-01-01 LAB — BRAIN NATRIURETIC PEPTIDE: B Natriuretic Peptide: 86.1 pg/mL (ref 0.0–100.0)

## 2021-01-01 LAB — D-DIMER, QUANTITATIVE: D-Dimer, Quant: 1.21 ug/mL-FEU — ABNORMAL HIGH (ref 0.00–0.50)

## 2021-01-01 MED ORDER — ATORVASTATIN CALCIUM 40 MG PO TABS
40.0000 mg | ORAL_TABLET | Freq: Every day | ORAL | Status: DC
Start: 2021-01-01 — End: 2021-01-02
  Administered 2021-01-01 – 2021-01-02 (×2): 40 mg via ORAL
  Filled 2021-01-01 (×2): qty 1

## 2021-01-01 MED ORDER — THIAMINE HCL 100 MG/ML IJ SOLN
100.0000 mg | Freq: Every day | INTRAMUSCULAR | Status: DC
  Filled 2021-01-01: qty 2

## 2021-01-01 MED ORDER — IOHEXOL 350 MG/ML SOLN
100.0000 mL | Freq: Once | INTRAVENOUS | Status: AC | PRN
  Administered 2021-01-01: 75 mL via INTRAVENOUS

## 2021-01-01 MED ORDER — LORAZEPAM 2 MG/ML IJ SOLN: 1.0000 mg | INTRAMUSCULAR | Status: DC | PRN

## 2021-01-01 MED ORDER — ADULT MULTIVITAMIN W/MINERALS CH
1.0000 | ORAL_TABLET | Freq: Every day | ORAL | Status: DC
  Administered 2021-01-01 – 2021-01-02 (×2): 1 via ORAL
  Filled 2021-01-01 (×2): qty 1

## 2021-01-01 MED ORDER — AMLODIPINE BESYLATE 5 MG PO TABS
5.0000 mg | ORAL_TABLET | Freq: Every day | ORAL | Status: DC
  Administered 2021-01-01 – 2021-01-02 (×2): 5 mg via ORAL
  Filled 2021-01-01 (×2): qty 1

## 2021-01-01 MED ORDER — MAGNESIUM SULFATE 2 GM/50ML IV SOLN
2.0000 g | Freq: Once | INTRAVENOUS | Status: AC
  Administered 2021-01-01: 2 g via INTRAVENOUS
  Filled 2021-01-01: qty 50

## 2021-01-01 MED ORDER — FOLIC ACID 1 MG PO TABS
1.0000 mg | ORAL_TABLET | Freq: Every day | ORAL | Status: DC
  Administered 2021-01-01 – 2021-01-02 (×2): 1 mg via ORAL
  Filled 2021-01-01 (×2): qty 1

## 2021-01-01 MED ORDER — THIAMINE HCL 100 MG PO TABS
100.0000 mg | ORAL_TABLET | Freq: Every day | ORAL | Status: DC
  Administered 2021-01-01 – 2021-01-02 (×2): 100 mg via ORAL
  Filled 2021-01-01 (×2): qty 1

## 2021-01-01 MED ORDER — LORAZEPAM 1 MG PO TABS
1.0000 mg | ORAL_TABLET | ORAL | Status: DC | PRN
Start: 2021-01-01 — End: 2021-01-02
  Administered 2021-01-02: 1 mg via ORAL
  Filled 2021-01-01: qty 1

## 2021-01-01 NOTE — Progress Notes (Addendum)
Cardiology Consultation:   Patient ID: Bradley Ray MRN: [id number]; DOB: [birth year]  Admit date: 12/31/2020 Date of Consult: 01/01/2021  PCP:  Nolene Ebbs, MD   [place]  Cardiologist:  New to [place]; Dr. Marlou Porch Advanced Practice Provider:  No care team member to display Electrophysiologist:  None   Patient Profile:   Bradley Ray is a 65 y.o. male with a PMH of HTN, HLD, chronic diastolic CHF, beta thalassemia, splenic infarct, asthma, prostate cancer s/p TURP, ETOH abuse, and OA s/p left knee replacement 12/31/20 who is being seen today for the evaluation of abnormal EKG at the request of Dr. Neysa Bonito.  History of Present Illness:   Mr. Bradley Ray presented for an elective left knee replacement 12/31/20. He tolerated the procedure well without any immediate complications, however around 6:00 pm, he was noted to be hypoxic to the 80s on room air, tachycardic, and hypertensive, though overall asymptomatic. A rapid response was called. O2 sats improved with O2 via [place]. EKG showed sinus tachycardia with rate 120bpm, no STE/D, no TWI, possible old inferior infarct (not clearly seen on EKG in 2020). HsTrop negative x4. IVF's were discontinued at that time. Medicine was consulted. BNP wnl. CXR this morning showed no acute findings. Cardiology asked to evaluate for abnormal EKG.  The patient does not follow with cardiology outpatient. He had an echocardiogram 12/2018 which showed EF 55-60%, mild LV dilation, G1DD, and no significant valvular abnormalities. Does not appear to have had prior ischemic testing, however CTA chest 12/2018 showed coronary, aortic arch, and branch vessel atherosclerotic vascular disease, with primarily LAD coronary artery involvement.   At the time of this evaluation he feels back to his usual self, aside from knee pain and feeling tired since he has not slept well due to pain. He reported feeling perfectly fine yesterday evening when his O2 sat  reportedly dropped to 80's. He states he is quite active at baseline, walking every day and push mow's a couple yards regularly. He denies any issues with chest pain, SOB, DOE, LE edema, palpitations, dizziness, lightheadedness, syncope, orthopnea, PND, or daytime somnolence. His only complaint prior to admission was random diaphoretic spells. He states these occur several times per day. No clear exertional component and seems to occur more commonly at night. He has no associated symptoms other than sweating. He denies significant family history of CAD or CHF, though mother does have a PPM due to bradycardia issues. He denies significant ETOH use, tobacco abuse, or illicit drug use.   Past Medical History:  Diagnosis Date  . Alcoholism ([place])    per epic documentation long hx dependence--- (06-30-2019  per pt was drinking average 3 shots dialy until 03/ 2020, since then one beer weekly)  . Arthritis    knees  . Asthma   . Beta thalassemia trait    per hematologist/ oncologist note- dr Bradley Ray--  per blood work-up  dx 02/ 2017  . Bladder neck contracture   . BPH with obstruction/lower urinary tract symptoms   . Chronic kidney disease    Hx: of Hemodyalysis  . Cocaine abuse, in remission ([place])    06-30-2019   per pt states last used 12/ 2017  . COVID 11/09/2019   loss of taste and smell and appetite x 14 days all symptoms resolved  . ED (erectile dysfunction)   . Hepatitis    hepatitis a age 13  . History of acute renal failure 12/2018   kidneys fully recovered no  nephrologist per pt  . History of chest pain    01-06-2019 hospital admission-- dx acute renal failure, hypotensive with associated shock liver, SIRS with tachycardia/ tachyapnea,  chf diastolic  ;   04-22-1974  per pt no chest pain since and everything resolved , followed by pcp  . Hyperlipidemia   . Hypertension   . Prostate cancer [facility]) urologist-  dr Bradley Ray/  oncologist-  dr Bradley Ray   dx 07-02-2016,  T1c,  Gleason 3+3,  PSA  6.48,  48.44cc  . 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 Wears dentures    upper    Past Surgical History:  Procedure Laterality Date  . CYSTOSCOPY N/A 10/15/2016   Procedure: CYSTOSCOPY FLEXIBLE;  Surgeon: Bradley Seal, MD;  Location: [facility];  Service: Urology;  Laterality: N/A;  NO SEEDS FOUND IN BLADDER  . CYSTOSCOPY N/A 08/01/2020   Procedure: CYSTOSCOPY/removal of bladder stone and bladder neck biopsy;  Surgeon: Bradley Seal, MD;  Location: [facility];  Service: Urology;  Laterality: N/A;  . IR FLUORO GUIDE CV LINE RIGHT  01/09/2019  . IR US GUIDE VASC ACCESS RIGHT  01/09/2019  . ORIF LEFT ANKLE  1980's  . PROSTATE BIOPSY    . RADIOACTIVE SEED IMPLANT N/A 10/15/2016   Procedure: RADIOACTIVE SEED IMPLANT/BRACHYTHERAPY IMPLANT;  Surgeon: Bradley Seal, MD;  Location: [facility];  Service: Urology;  Laterality: N/A;     66    SEEDS IMPLANTED  . REPAIR EXTENSOR TENDON Right 06/02/2016   Procedure: RIGHT LONG FINGER EXTENSOR TENDON REPAIR;  Surgeon: Bradley Jakob, MD;  Location: [facility];  Service: Orthopedics;  Laterality: Right;  . TOTAL KNEE ARTHROPLASTY Right 2010  approx.  Marland Kitchen TOTAL KNEE ARTHROPLASTY Left 12/31/2020   Procedure: TOTAL KNEE ARTHROPLASTY;  Surgeon: Bradley Butters, MD;  Location: [facility];  Service: Orthopedics;  Laterality: Left;  . TRANSURETHRAL INCISION OF BLADDER NECK N/A 07/04/2019   Procedure: CYSTOSCOPY WITH INCISION OF BLADDER NECK CONTRACTURE, BALLOON DILITATION;  Surgeon: Bradley Seal, MD;  Location: [facility];  Service: Urology;  Laterality: N/A;  . TRANSURETHRAL INCISION OF BLADDER NECK N/A 08/01/2020   Procedure: INCISION OF BLADDER NECK;  Surgeon: Bradley Seal, MD;  Location: [facility];  Service: Urology;  Laterality:  N/A;  1 HR  . TRANSURETHRAL RESECTION OF PROSTATE N/A 06/16/2018   Procedure: TRANSURETHRAL RESECTION OF THE PROSTATE (TURP);  Surgeon: Bradley Seal, MD;  Location: [facility];  Service: Urology;  Laterality: N/A;     Home Medications:  Prior to Admission medications   Medication Sig Start Date End Date Taking? Authorizing Provider  amLODipine (NORVASC) 5 MG tablet Take 5 mg by mouth daily.   Yes [provider]  atorvastatin (LIPITOR) 40 MG tablet Take 1 tablet (40 mg total) by mouth daily. Patient taking differently: Take 40 mg by mouth daily. 11/28/15  Yes Kelvin Cellar, MD  folic acid (FOLVITE) 1 MG tablet Take 1 tablet (1 mg total) by mouth daily. 01/19/19  Yes Antonieta Pert, MD  ibuprofen (ADVIL) 800 MG tablet Take 1 tablet (800 mg total) by mouth every 8 (eight) hours as needed for mild pain or moderate pain. 12/31/20  Yes Gawne, Meghan M, PA-C  Ketotifen Fumarate (ALAWAY OP) Place 1 drop into both eyes daily as  needed (itchy eyes).   Yes [provider]  methocarbamol (ROBAXIN) 500 MG tablet Take 1 tablet (500 mg total) by mouth every 8 (eight) hours as needed for muscle spasms. 12/31/20  Yes Gawne, Meghan M, PA-C  omeprazole (PRILOSEC OTC) 20 MG tablet Take 1 tablet (20 mg total) by mouth daily. For gastric protection while taking NSAIDs 12/31/20 01/30/21 Yes Gawne, Meghan M, PA-C  ondansetron (ZOFRAN) 4 MG tablet Take 1 tablet (4 mg total) by mouth daily as needed for nausea or vomiting. 12/31/20  Yes Gawne, Meghan M, PA-C  OVER THE COUNTER MEDICATION Apply 1 application topically daily as needed (pain). Hempvana cream   Yes [provider]  oxyCODONE (ROXICODONE) 5 MG immediate release tablet Take 1 tablet (5 mg total) by mouth every 6 (six) hours as needed for severe pain. 12/31/20  Yes Gawne, Meghan M, PA-C  thiamine (VITAMIN B-1) 100 MG tablet Take 100 mg by mouth daily.   Yes [provider]  albuterol (PROVENTIL HFA;VENTOLIN HFA) 108 (90 Base) MCG/ACT inhaler  Inhale 2 puffs into the lungs every 6 (six) hours as needed for wheezing or shortness of breath.    [provider]  aspirin EC 81 MG tablet Take 1 tablet (81 mg total) by mouth in the morning and at bedtime. To prevent blood clots after surgery 12/31/20   Aggie Moats M, PA-C  colchicine 0.6 MG tablet Take 0.5 tablets (0.3 mg total) by mouth 2 (two) times a week for 8 doses. Patient not taking: Reported on 09/27/2019 01/19/19 09/27/19  Antonieta Pert, MD    Inpatient Medications: Scheduled Meds: . acetaminophen  1,000 mg Oral Q6H  . amLODipine  5 mg Oral Daily  . aspirin  81 mg Oral BID  . atorvastatin  40 mg Oral Daily  . dexamethasone (DECADRON) injection  10 mg Intravenous Once  . docusate sodium  100 mg Oral BID  . folic acid  1 mg Oral Daily  . multivitamin with minerals  1 tablet Oral Daily  . pantoprazole  40 mg Oral Daily  . thiamine  100 mg Oral Daily   Or  . thiamine  100 mg Intravenous Daily  . traMADol  50 mg Oral Q6H   Continuous Infusions: . 0.9 % NaCl with KCl 20 mEq / L 100 mL/hr at 01/01/21 0615  . magnesium sulfate bolus IVPB    . methocarbamol (ROBAXIN) IV 500 mg (12/31/20 1202)   PRN Meds: acetaminophen, alum & mag hydroxide-simeth, bisacodyl, diphenhydrAMINE, HYDROmorphone (DILAUDID) injection, LORazepam **OR** LORazepam, magnesium citrate, menthol-cetylpyridinium **OR** phenol, methocarbamol **OR** methocarbamol (ROBAXIN) IV, metoCLOPramide **OR** metoCLOPramide (REGLAN) injection, ondansetron **OR** ondansetron (ZOFRAN) IV, oxyCODONE, oxyCODONE, polyethylene glycol  Allergies:    Allergies  Allergen Reactions  . Morphine And Related Rash    Social History:   Social History   Socioeconomic History  . Marital status: Single    Spouse name: Not on file  . Number of children: Not on file  . Years of education: Not on file  . Highest education level: Not on file  Occupational History  . Not on file  Tobacco Use  . Smoking status: Former Smoker     Packs/day: 1.00    Years: 30.00    Pack years: 30.00    Types: Cigarettes    Quit date: 06/01/2006    Years since quitting: 14.5  . Smokeless tobacco: Never Used  Vaping Use  . Vaping Use: Never used  Substance and Sexual Activity  . Alcohol use: Yes  Comment: 1 beer fewdays ago  as of 07-23-2020  . Drug use: Yes    Types: "Crack" cocaine, Marijuana    Comment: per pt on 06-30-2019  states no cocaine since  2017/ last marijuana last used 07-25-2020  . Sexual activity: Yes  Other Topics Concern  . Not on file  Social History Narrative  . Not on file   Social Determinants of Health   Financial Resource Strain: Not on file  Food Insecurity: Not on file  Transportation Needs: Not on file  Physical Activity: Not on file  Stress: Not on file  Social Connections: Not on file  Intimate Partner Violence: Not on file    Family History:    Family History  Problem Relation Age of Onset  . Arrhythmia Mother        Has pacer  . Throat cancer Father   . Pancreatic cancer Sister   . Clotting disorder Sister        related knee surgeries  . Cancer Sister        pancreatic  . Sickle cell trait Son   . Cancer Maternal Aunt   . Cancer Maternal Uncle   . Cancer Other      ROS:  Please see the history of present illness.   All other ROS reviewed and negative.     Physical Exam/Data:   Vitals:   12/31/20 2000 01/01/21 0138 01/01/21 0513 01/01/21 0600  BP:  140/88 (!) 154/91   Pulse:  (!) 104 (!) 102   Resp:  20 (!) 22 20  Temp:  98.4 F (36.9 C) 98.4 F (36.9 C)   TempSrc:  Oral Oral   SpO2: 95% 94% 96% 95%  Weight:      Height:        Intake/Output Summary (Last 24 hours) at 01/01/2021 0824 Last data filed at 01/01/2021 0615 Gross per 24 hour  Intake 3023.54 ml  Output 3950 ml  Net -926.46 ml   Last 3 Weights 12/31/2020 12/25/2020 08/01/2020  Weight (lbs) 178 lb 178 lb 177 lb 3.2 oz  Weight (kg) 80.74 kg 80.74 kg 80.377 kg     Body mass index is 28.73 kg/m.   General:  Well nourished, well developed, in no acute distress HEENT: sclera anicteric Neck: no JVD Vascular: No carotid bruits; distal pulses 2+ bilaterally  Cardiac:  normal S1, S2; RRR; no murmurs, rubs, or gallops Lungs:  clear to auscultation bilaterally, no wheezing, rhonchi or rales  Abd: soft, nontender, no hepatomegaly  Ext: no edema; LLE in leg immobilizer  Musculoskeletal:  No deformities, BUE and BLE strength normal and equal Skin: warm and dry  Neuro:  CNs 2-12 intact, no focal abnormalities noted Psych:  Normal affect   EKG:  The EKG was personally reviewed and demonstrates:   sinus tachycardia with rate 120bpm, no STE/D, no TWI, possible old inferior infarct (not clearly seen on EKG in 2020). Telemetry:  Telemetry was personally reviewed and demonstrates:  Sinus tachycardia with rates 100s-120s  Relevant CV Studies: Echocardiogram 12/2018: 1. The left ventricle has normal systolic function, with an ejection  fraction of 55-60%. The cavity size was mildly dilated. Left ventricular  diastolic Doppler parameters are consistent with impaired relaxation.  2. The right ventricle has normal systolc function. The cavity was  normal. There is no increase in right ventricular wall thickness.  3. No evidence present in the left atrial appendage.  4. There is mild to moderate mitral annular calcification present.  5. The  aortic valve is tricuspid Mild sclerosis of the aortic valve.  Aortic valve regurgitation was not assessed by color flow Doppler.  6. No pulmonic valve vegetation visualized.  7. The interatrial septum appears to be lipomatous.   Laboratory Data:  High Sensitivity Troponin:   Recent Labs  Lab 12/31/20 1830 12/31/20 1913 12/31/20 2035 12/31/20 2134  TROPONINIHS <2 2 2 3      Chemistry Recent Labs  Lab 12/25/20 1155 12/31/20 1828 01/01/21 0425  NA 139 139 136  K 3.6 3.2* 3.2*  CL 105 105 104  CO2 24 22 22   GLUCOSE 104* 124* 113*  BUN 14 9  6*  CREATININE 0.91 0.85 0.74  CALCIUM 9.5 8.7* 8.4*  GFRNONAA >60 >60 >60  ANIONGAP 10 12 10     No results for input(s): PROT, ALBUMIN, AST, ALT, ALKPHOS, BILITOT in the last 168 hours. Hematology Recent Labs  Lab 12/25/20 1155 12/31/20 1016 01/01/21 0425  WBC 6.6 9.0 13.9*  RBC 6.36* 5.93* 5.36  HGB 13.0 12.1* 10.8*  HCT 42.3 39.2 35.0*  MCV 66.5* 66.1* 65.3*  MCH 20.4* 20.4* 20.1*  MCHC 30.7 30.9 30.9  RDW 18.6* 17.9* 16.8*  PLT 269 196 161   BNPNo results for input(s): BNP, PROBNP in the last 168 hours.  DDimer No results for input(s): DDIMER in the last 168 hours.   Radiology/Studies:  DG Knee Left Port  Result Date: 12/31/2020 CLINICAL DATA:  Post LEFT knee arthroplasty EXAM: PORTABLE LEFT KNEE - 1-2 VIEW COMPARISON:  Portable exam 9470 hours compared to 04/05/2020 FINDINGS: Components of LEFT knee prosthesis identified in expected positions. No acute fracture, dislocation, or bone destruction. Non fused ossicle at tibial tubercle. Expected soft tissue changes. Mild atherosclerotic calcifications of distal superficial femoral and popliteal arteries. IMPRESSION: LEFT knee prosthesis without acute complication. Electronically Signed   By: Lavonia Dana M.D.   On: 12/31/2020 12:38     Assessment and Plan:   1. Abnormal EKG in patient with coronary artery calcifications on CT scan: EKG obtained in the setting of acute hypoxia with O2 sats in the 80s on RA, though patient was asymptomatic at the time. He was found to have sinus tachycardia without acute ischemic changes, though some evidence of prior inferior infarct, not clearly seen on EKG in 2020. His last echo 12/2018 showed EF 55-60%, G1DD, mild LV dilation, and no significant valvular abnormalities. Does not appear to have had prior ischemic testing, though CTA chest 12/2018 showed coronary artery calcifications, predominately in the LAD. He has no complaints of recent chest pain or DOE.  - Will update an echocardiogram to  ensure stable LV function and no RWMA.  - Given lack of anginal complaints, no acute ischemic changes on EKG, and negative troponins, favor outpatient coronary CTA to define coronary anatomy after recovery from surgery, assuming LV function is stable and no RWMA seen on echo - Continue aggressive risk factor modifications for goal BP <130/80, LDL <70, and A1C <7 - will check FLP and A1C for risk stratification  2. Acute hypoxic respiratory failure in patient with chronic diastolic CHF: patient's O2 sats dropped to 80s on RA yesterday following knee replacement surgery. He had no complaints of SOB at that time. O2 sats improved with O2 via [place]. EKG was non-ischemic with sinus tachycardia. HsTrop was negative x4. CXR this morning was without acute findings. BNP wnl. He has mild leukocytosis which was felt to be a stress response from surgery and receiving steroids. He remains afebrile. IVF's discontinued given  concerns for possible volume overload, though he appeared euvolemic and no diuretics administered. I&Os with net +240mL in the past 24 hours. Ddimer mildly elevated. CTA chest was without PE, though dependent atelectasis, and coronary artery calcifications noted. Not clearly due to cardiac etiology. Possibly due to atelectasis, opioid use, or pain - Will follow-up echocardiogram results as above - Continue efforts to wean off [place] - Continue to encourage incentive spirometer use  3. Sinus tachycardia: likely normal physiologic response to pain.  - Continue to monitor   4. HTN: BP persistently elevated above goal of <130/80. Home amlodipine 5mg  daily continued. Possible post-op pain is contributing to hypertension - Will continue amlodipine for now - low threshold to uptitrate if BP remains elevated despite adequate pain control - Would also be reasonable to add BBlocker therapy in light of suspected CAD.   5. HLD: last lipid panel 12/2018 with Tcholesterol 333 though unable to calculate LDL in the  setting of significantly elevated triglycerides to 4875. Appears he was subsequently started on atorvastatin - Continue atorvastatin - Will check FLP in AM     Risk Assessment/Risk Scores:   New York Heart Association (NYHA) Functional Class NYHA Class I        For questions or updates, please contact [facility] Please consult [email or web address] for contact info under    Signed, Abigail Butts, PA-C  01/01/2021 8:24 AM  Personally seen and examined. Agree with above.   65 year old male being seen for the evaluation of abnormal EKG following knee replacement at the request of Dr. Sonny Masters.  Prior history of beta thalassemia splenic infarct hyperlipidemia hypertension chronic diastolic heart failure alcohol use prostate cancer status post TURP.  Prior echocardiogram 2020 showed normal EF with mild LV dilatation.  Grade 1 diastolic dysfunction.  CTA of chest showed coronary and aortic arch atherosclerotic disease primarily involving the LAD segment.  No evidence of PE.  Recent O2 sats dropping into the 80s following knee replacement.  Random diaphoretic spells.  No alcohol or tobacco use.  No history of CAD.  EKG demonstrates sinus rhythm PVC poor R wave progression with no ischemic changes.  GEN: Well nourished, well developed, in no acute distress  HEENT: normal  Neck: no JVD, carotid bruits, or masses Cardiac: RRR; no murmurs, rubs, or gallops,no edema  Respiratory:  clear to auscultation bilaterally, normal work of breathing GI: soft, nontender, nondistended, + BS MS: no deformity or atrophy  Skin: warm and dry, no rash Neuro:  Alert and Oriented x 3, Strength and sensation are intact Psych: euthymic mood, full affect  Assessment and plan:  Abnormal EKG Coronary artery calcification seen on CT -Predominant LAD calcifications noted on CT -We will check an echocardiogram to ensure proper structure and function. -No acute ischemic changes noted on ECG.  Negative  troponin numbers. -It would be very reasonable to proceed with outpatient coronary CTA to define coronary anatomy after his recovery from surgery.  No urgency. -Agree with aspirin 81, atorvastatin 40 mg.  Chronic diastolic heart failure with hypoxic respiratory failure -Await echocardiogram -No evidence of PE -Incentive spirometer.  Sinus tachycardia -Physiologic  Essential hypertension -Agree that addition of beta-blocker may be helpful in the future.  We will sign off We will set up follow-up in clinic.  Candee Furbish, MD

## 2021-01-01 NOTE — Plan of Care (Signed)
  Problem: Education: Goal: Knowledge of General Education information will improve Description Including pain rating scale, medication(s)/side effects and non-pharmacologic comfort measures Outcome: Progressing   Problem: Health Behavior/Discharge Planning: Goal: Ability to manage health-related needs will improve Outcome: Progressing   

## 2021-01-01 NOTE — Plan of Care (Signed)
  Problem: Clinical Measurements: Goal: Ability to maintain clinical measurements within normal limits will improve Outcome: Progressing Goal: Will remain free from infection Outcome: Progressing Goal: Respiratory complications will improve Outcome: Progressing Goal: Cardiovascular complication will be avoided Outcome: Progressing   Problem: Activity: Goal: Risk for activity intolerance will decrease Outcome: Progressing   Problem: Pain Managment: Goal: General experience of comfort will improve Outcome: Progressing   Problem: Safety: Goal: Ability to remain free from injury will improve Outcome: Progressing   Problem: Skin Integrity: Goal: Risk for impaired skin integrity will decrease Outcome: Progressing

## 2021-01-01 NOTE — Progress Notes (Signed)
Physical Therapy Treatment Patient Details Name: Bradley Ray MRN: [id number] DOB: [birth year] Today's Date: 01/01/2021    History of Present Illness Patient is a 65 y.o. male s/p Lt TKA on 12/31/2020 with PMH significant for prostate cancer, HTN, HLD, Covid, CKD, asthma, OA, alcohol/drug abuse, ORIF Lt ankle (1980's).    PT Comments    Patient c/o itching. RN gave OK to ambulate before CT performed. Patient reports ambulation relieved some l knee pain. Patient's SPO2 925, HR 110 when ambulating. Will check back this PM.  Follow Up Recommendations  Outpatient PT (3/25 appt)     Equipment Recommendations  3in1 (PT)    Recommendations for Other Services       Precautions / Restrictions Precautions Precautions: Fall;Knee Required Braces or Orthoses: Knee Immobilizer - Right Knee Immobilizer - Right: On when out of bed or walking;Discontinue once straight leg raise with < 10 degree lag Restrictions Other Position/Activity Restrictions: WBAT    Mobility  Bed Mobility Overal bed mobility: Needs Assistance       Supine to sit: Min guard     General bed mobility comments: used belt to self move LLE    Transfers Overall transfer level: Needs assistance Equipment used: Rolling walker (2 wheeled) Transfers: Sit to/from Stand Sit to Stand: Min guard            Ambulation/Gait Ambulation/Gait assistance: Min guard Gait Distance (Feet): 70 Feet Assistive device: Rolling walker (2 wheeled) Gait Pattern/deviations: Step-to pattern;Decreased stance time - left;Antalgic Gait velocity: decr   General Gait Details: patient reports ambulating felt good. less pain   Stairs             Wheelchair Mobility    Modified Rankin (Stroke Patients Only)       Balance           Standing balance support: Bilateral upper extremity supported;During functional activity Standing balance-Leahy Scale: Fair                              Cognition  Arousal/Alertness: Awake/alert                                            Exercises      General Comments        Pertinent Vitals/Pain Pain Score: 10-Worst pain ever Pain Location: 12 Pain Descriptors / Indicators: Aching;Discomfort;Grimacing Pain Intervention(s): Monitored during session;Premedicated before session    Home Living                      Prior Function            PT Goals (current goals can now be found in the care plan section) Progress towards PT goals: Progressing toward goals    Frequency    7X/week      PT Plan Current plan remains appropriate    Co-evaluation              AM-PAC PT "6 Clicks" Mobility   Outcome Measure  Help needed turning from your back to your side while in a flat bed without using bedrails?: A Little Help needed moving from lying on your back to sitting on the side of a flat bed without using bedrails?: A Little Help needed moving to and from a bed to a chair (including a wheelchair)?:  A Little Help needed standing up from a chair using your arms (e.g., wheelchair or bedside chair)?: A Little Help needed to walk in hospital room?: A Little Help needed climbing 3-5 steps with a railing? : A Lot 6 Click Score: 17    End of Session Equipment Utilized During Treatment: Gait belt;Right knee immobilizer Activity Tolerance: Patient tolerated treatment well Patient left: in bed (treatment limited by need to go for  CT) Nurse Communication: Mobility status;Patient requests pain meds PT Visit Diagnosis: Muscle weakness (generalized) (M62.81);Difficulty in walking, not elsewhere classified (R26.2);Pain Pain - Right/Left: Left Pain - part of body: Knee     Time: 5537-4827 PT Time Calculation (min) (ACUTE ONLY): 22 min  Charges:  $Gait Training: 8-22 mins                     [place] Pager [phone] Office [phone]    Claretha Cooper 01/01/2021, 1:58 PM

## 2021-01-01 NOTE — Progress Notes (Signed)
    Subjective: Patient reports pain as severe. Pain meds and muscle relaxer about to be given. Didn't sleep well. Tolerating diet. Urinating. No CP, SOB.    Objective:   VITALS:   Vitals:   12/31/20 2000 01/01/21 0138 01/01/21 0513 01/01/21 0600  BP:  140/88 (!) 154/91   Pulse:  (!) 104 (!) 102   Resp:  20 (!) 22 20  Temp:  98.4 F (36.9 C) 98.4 F (36.9 C)   TempSrc:  Oral Oral   SpO2: 95% 94% 96% 95%  Weight:      Height:       CBC Latest Ref Rng & Units 01/01/2021 12/31/2020 12/25/2020  WBC 4.0 - 10.5 K/uL 13.9(H) 9.0 6.6  Hemoglobin 13.0 - 17.0 g/dL 10.8(L) 12.1(L) 13.0  Hematocrit 39.0 - 52.0 % 35.0(L) 39.2 42.3  Platelets 150 - 400 K/uL 161 196 269   BMP Latest Ref Rng & Units 01/01/2021 12/31/2020 12/25/2020  Glucose 70 - 99 mg/dL 113(H) 124(H) 104(H)  BUN 8 - 23 mg/dL 6(L) 9 14  Creatinine 0.61 - 1.24 mg/dL 0.74 0.85 0.91  Sodium 135 - 145 mmol/L 136 139 139  Potassium 3.5 - 5.1 mmol/L 3.2(L) 3.2(L) 3.6  Chloride 98 - 111 mmol/L 104 105 105  CO2 22 - 32 mmol/L 22 22 24   Calcium 8.9 - 10.3 mg/dL 8.4(L) 8.7(L) 9.5   Intake/Output      03/22 0701 03/23 0700 03/23 0701 03/24 0700   P.O. 720    I.V. (mL/kg) 2403.5 (29.8)    IV Piggyback 1100    Total Intake(mL/kg) 4223.5 (52.3)    Urine (mL/kg/hr) 3950 (2)    Stool 0    Total Output 3950    Net +273.5         Stool Occurrence 1 x       Physical Exam: General: NAD. Laying in bed. Uncomfortable d/t pain Resp: No increased wob Cardio: tachycardia ABD soft Neurologically intact MSK Neurovascularly intact Sensation intact distally Intact pulses distally Dorsiflexion/Plantar flexion intact Incision: dressing C/D/I Ice packs around Left knee  Assessment: 1 Day Post-Op  S/P Procedure(s) (LRB): TOTAL KNEE ARTHROPLASTY (Left) by Dr. Ernesta Amble. Murphy on 01/01/21  Active Problems:   S/P total knee arthroplasty, left   Plan: Seen by hospitalist Awaiting cardiology eval Advance diet Up with  therapy Incentive Spirometry Elevate and Apply ice  Weightbearing: WBAT LLE Insicional and dressing care: Dressings left intact until follow-up and Reinforce dressings as needed Orthopedic device(s): None Showering: Keep dressing dry VTE prophylaxis: Aspirin 81mg  BID for 30 days post-op, SCDs, ambulation Pain control: Continue current regimen Follow - up plan: 1 week Contact information:  Edmonia Lynch MD, Aggie Moats PA-C  Dispo: TBD based on hospitalists and cardiology evals. If they clear him, we are ok for d/c home.   Britt Bottom, PA-C Office [phone] 01/01/2021, 8:24 AM

## 2021-01-01 NOTE — Plan of Care (Signed)
°  Problem: Education: Goal: Knowledge of General Education information will improve Description: Including pain rating scale, medication(s)/side effects and non-pharmacologic comfort measures 01/01/2021 1411 by Zadie Rhine, RN Outcome: Progressing 01/01/2021 1357 by Zadie Rhine, RN Outcome: Progressing   Problem: Health Behavior/Discharge Planning: Goal: Ability to manage health-related needs will improve 01/01/2021 1411 by Zadie Rhine, RN Outcome: Progressing 01/01/2021 1357 by Zadie Rhine, RN Outcome: Progressing   Problem: Clinical Measurements: Goal: Ability to maintain clinical measurements within normal limits will improve Outcome: Progressing

## 2021-01-01 NOTE — Progress Notes (Signed)
  Echocardiogram 2D Echocardiogram has been performed.  Randa Lynn Delvin Hedeen 01/01/2021, 4:04 PM

## 2021-01-01 NOTE — Consult Note (Addendum)
Medical Consultation   Bradley Ray  DTO:[id number]  DOB: Feb 13, 1956  DOA: 12/31/2020  PCP: Nolene Ebbs, MD    Requesting physician: Dr. Percell Dakin  Reason for consultation: Tachycardia   History of Present Illness: This is a 65 y.o. male with a past medical history of alcohol dependence, beta thalassemia, prior cocaine use, HFpEF, osteoarthritis, hypertension, hyperlipidemia, splenic infarct, asthma who was admitted on 12/31/20 by Dr. Percell Bonne[place], ortho, for left total knee arthroplasty which he underwent on 3/22. No complications documented during the procedure. After his procedure at roughly 6:30 pm, the ortho PA was called regarding elevated BP, tachycardia and hypoxia into the 80s on room air which resolved with supplemental O2. An EKG was ordered which showed sinus tachycardia, possible LAE and inferior infarct. A rapid response was called, a troponin was ordered and cardiology was consulted. At the time, the patient denied any chest pain, shortness of breath or any other complaints.   Currently, the patient states that he is having left knee pain but he had just received dilaudid minutes ago. States that he last used alcohol 4-5 days ago and denies any hallucinations or seizures and denies any cocaine use for years. Admits to night sweats which is normal for him. He denies any chest pain, palpitations, leg swelling or any other complaints. Denies tobacco use or known history of cardiac conditions.    Review of Systems:  Review of Systems  All other systems reviewed and are negative.  As per HPI otherwise 10 point review of systems negative.     Past Medical History: Past Medical History:  Diagnosis Date  . Alcoholism ([employer])    per epic documentation long hx dependence--- (06-30-2019  per pt was drinking average 3 shots dialy until 03/ 2020, since then one beer weekly)  . Arthritis    knees  . Asthma   . Beta thalassemia trait    per hematologist/ oncologist note-  dr Dorita Sciara--  per blood work-up  dx 02/ 2017  . Bladder neck contracture   . BPH with obstruction/lower urinary tract symptoms   . Chronic kidney disease    Hx: of Hemodyalysis  . Cocaine abuse, in remission ([place])    06-30-2019   per pt states last used 12/ 2017  . COVID 11/09/2019   loss of taste and smell and appetite x 14 days all symptoms resolved  . ED (erectile dysfunction)   . Hepatitis    hepatitis a age 59  . History of acute renal failure 12/2018   kidneys fully recovered no nephrologist per pt  . History of chest pain    01-06-2019 hospital admission-- dx acute renal failure, hypotensive with associated shock liver, SIRS with tachycardia/ tachyapnea,  chf diastolic  ;   98-33-8250  per pt no chest pain since and everything resolved , followed by pcp  . Hyperlipidemia   . Hypertension   . Prostate cancer [facility]) urologist-  dr wrenn/  oncologist-  dr Tammi Klippel   dx 07-02-2016,  T1c,  Gleason 3+3,  PSA 6.48,  48.44cc  . 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 Wears dentures    upper    Past Surgical History: Past Surgical History:  Procedure Laterality Date  .  CYSTOSCOPY N/A 10/15/2016   Procedure: CYSTOSCOPY FLEXIBLE;  Surgeon: [name], MD;  Location: [facility];  Service: Urology;  Laterality: N/A;  NO SEEDS FOUND IN BLADDER  . CYSTOSCOPY N/A 08/01/2020   Procedure: CYSTOSCOPY/removal of bladder stone and bladder neck biopsy;  Surgeon: [name], MD;  Location: [facility];  Service: Urology;  Laterality: N/A;  . IR FLUORO GUIDE CV LINE RIGHT  01/09/2019  . IR US GUIDE VASC ACCESS RIGHT  01/09/2019  . ORIF LEFT ANKLE  1980's  . PROSTATE BIOPSY    . RADIOACTIVE SEED IMPLANT N/A 10/15/2016   Procedure: RADIOACTIVE SEED IMPLANT/BRACHYTHERAPY IMPLANT;  Surgeon: [name], MD;   Location: [facility];  Service: Urology;  Laterality: N/A;     66    SEEDS IMPLANTED  . REPAIR EXTENSOR TENDON Right 06/02/2016   Procedure: RIGHT LONG FINGER EXTENSOR TENDON REPAIR;  Surgeon: Milly Jakob, MD;  Location: [place];  Service: Orthopedics;  Laterality: Right;  . TOTAL KNEE ARTHROPLASTY Right 2010  approx.  . TRANSURETHRAL INCISION OF BLADDER NECK N/A 07/04/2019   Procedure: CYSTOSCOPY WITH INCISION OF BLADDER NECK CONTRACTURE, BALLOON DILITATION;  Surgeon: [name], MD;  Location: [facility];  Service: Urology;  Laterality: N/A;  . TRANSURETHRAL INCISION OF BLADDER NECK N/A 08/01/2020   Procedure: INCISION OF BLADDER NECK;  Surgeon: [name], MD;  Location: [facility];  Service: Urology;  Laterality: N/A;  1 HR  . TRANSURETHRAL RESECTION OF PROSTATE N/A 06/16/2018   Procedure: TRANSURETHRAL RESECTION OF THE PROSTATE (TURP);  Surgeon: [name], MD;  Location: [facility];  Service: Urology;  Laterality: N/A;     Allergies:   Allergies  Allergen Reactions  . Morphine And Related Rash     Social History:  reports that he quit smoking about 14 years ago. His smoking use included cigarettes. He has a 30.00 pack-year smoking history. He has never used smokeless tobacco. He reports current alcohol use. He reports current drug use. Drugs: "Crack" cocaine and Marijuana.   Family History: Family History  Problem Relation Age of Onset  . Arrhythmia Mother        Has pacer  . Throat cancer Father   . Pancreatic cancer Sister   . Clotting disorder Sister        related knee surgeries  . Cancer Sister        pancreatic  . Sickle cell trait Son   . Cancer Maternal Aunt   . Cancer Maternal Uncle   . Cancer Other      Physical Exam: Vitals:   12/31/20 2000 01/01/21 0138 01/01/21 0513 01/01/21 0600  BP:  140/88 (!) 154/91   Pulse:  (!) 104 (!) 102   Resp:  20 (!) 22 20  Temp:  98.4 F (36.9 C) 98.4 F (36.9 C)   TempSrc:   Oral Oral   SpO2: 95% 94% 96% 95%  Weight:      Height:        Physical Exam Vitals and nursing note reviewed.  Constitutional:      General: He is not in acute distress.    Appearance: He is not toxic-appearing.  HENT:     Head: Normocephalic.     Mouth/Throat:     Mouth: Mucous membranes are moist.  Eyes:     Conjunctiva/sclera: Conjunctivae normal.  Cardiovascular:     Rate and Rhythm: Regular rhythm. Tachycardia present.     Heart sounds: No murmur heard.  Pulmonary:     Effort: Pulmonary effort is normal.     Comments: Good use of incentive spirometry O2 desaturation to 83% with room air and resolved with 3L/min Abdominal:     General: Abdomen is flat. There is no distension.  Musculoskeletal:        General: No swelling.     Comments: Post op left knee  Skin:    General: Skin is warm.     Coloration: Skin is not jaundiced.  Neurological:     Mental Status: He is alert. Mental status is at baseline.  Psychiatric:        Mood and Affect: Mood normal.        Behavior: Behavior normal.       Data reviewed:  I have personally reviewed following labs and imaging studies Labs:  CBC: Recent Labs  Lab 12/25/20 1155 12/31/20 1016 01/01/21 0425  WBC 6.6 9.0 13.9*  NEUTROABS  --  7.4 10.8*  HGB 13.0 12.1* 10.8*  HCT 42.3 39.2 35.0*  MCV 66.5* 66.1* 65.3*  PLT 269 196 109    Basic Metabolic Panel: Recent Labs  Lab 12/25/20 1155 12/31/20 1828 01/01/21 0425  NA 139 139 136  K 3.6 3.2* 3.2*  CL 105 105 104  CO2 24 22 22   GLUCOSE 104* 124* 113*  BUN 14 9 6*  CREATININE 0.91 0.85 0.74  CALCIUM 9.5 8.7* 8.4*   GFR Estimated Creatinine Clearance: 94.4 mL/min (by C-G formula based on SCr of 0.74 mg/dL). Liver Function Tests: No results for input(s): AST, ALT, ALKPHOS, BILITOT, PROT, ALBUMIN in the last 168 hours. No results for input(s): LIPASE, AMYLASE in the last 168 hours. No results for input(s): AMMONIA in the last 168 hours. Coagulation  profile Recent Labs  Lab 12/31/20 0555  INR 0.9    Cardiac Enzymes: No results for input(s): CKTOTAL, CKMB, CKMBINDEX, TROPONINI in the last 168 hours. BNP: Invalid input(s): POCBNP CBG: No results for input(s): GLUCAP in the last 168 hours. D-Dimer No results for input(s): DDIMER in the last 72 hours. Hgb A1c No results for input(s): HGBA1C in the last 72 hours. Lipid Profile No results for input(s): CHOL, HDL, LDLCALC, TRIG, CHOLHDL, LDLDIRECT in the last 72 hours. Thyroid function studies No results for input(s): TSH, T4TOTAL, T3FREE, THYROIDAB in the last 72 hours.  Invalid input(s): FREET3 Anemia work up Recent Labs    12/31/20 1016  FERRITIN 636*  TIBC 314  IRON 77   Urinalysis    Component Value Date/Time   COLORURINE YELLOW 01/17/2019 1310   APPEARANCEUR CLEAR 01/17/2019 1310   LABSPEC 1.008 01/17/2019 1310   PHURINE 6.0 01/17/2019 1310   GLUCOSEU NEGATIVE 01/17/2019 1310   HGBUR MODERATE (A) 01/17/2019 1310   BILIRUBINUR NEGATIVE 01/17/2019 1310   KETONESUR NEGATIVE 01/17/2019 1310   PROTEINUR NEGATIVE 01/17/2019 1310   NITRITE NEGATIVE 01/17/2019 1310   LEUKOCYTESUR SMALL (A) 01/17/2019 1310     Microbiology Recent Results (from the past 240 hour(s))  Surgical pcr screen     Status: None   Collection Time: 12/25/20 11:55 AM   Specimen: Nasal Mucosa; Nasal Swab  Result Value Ref Range Status   MRSA, PCR NEGATIVE NEGATIVE Final   Staphylococcus aureus NEGATIVE NEGATIVE Final    Comment: (NOTE) The Xpert SA Assay (FDA approved for NASAL specimens in patients 48 years of age and older), is one component of a comprehensive surveillance program. It is not intended to diagnose infection nor to guide or monitor treatment. Performed at  [facility], [place] [address] [place], [place] [address]   SARS CORONAVIRUS 2 (TAT 6-24 HRS) Nasopharyngeal Nasopharyngeal Swab     Status: None   Collection Time: 12/27/20  9:31 AM   Specimen:  Nasopharyngeal Swab  Result Value Ref Range Status   SARS Coronavirus 2 NEGATIVE NEGATIVE Final    Comment: (NOTE) SARS-CoV-2 target nucleic acids are NOT DETECTED.  The SARS-CoV-2 RNA is generally detectable in upper and lower respiratory specimens during the acute phase of infection. Negative results do not preclude SARS-CoV-2 infection, do not rule out co-infections with other pathogens, and should not be used as the sole basis for treatment or other patient management decisions. Negative results must be combined with clinical observations, patient history, and epidemiological information. The expected result is Negative.  Fact Sheet for Patients: SugarRoll.be  Fact Sheet for Healthcare Providers: https://www.woods-mathews.com/  This test is not yet approved or cleared by the [place] FDA and  has been authorized for detection and/or diagnosis of SARS-CoV-2 by FDA under an Emergency Use Authorization (EUA). This EUA will remain  in effect (meaning this test can be used) for the duration of the COVID-19 declaration under Se ction 564(b)(1) of the Act, 21 U.S.C. section 360bbb-3(b)(1), unless the authorization is terminated or revoked sooner.  Performed at [facility], [place] [address], [place], [place] [address]        Inpatient Medications:   Scheduled Meds: . acetaminophen  1,000 mg Oral Q6H  . aspirin  81 mg Oral BID  . dexamethasone (DECADRON) injection  10 mg Intravenous Once  . docusate sodium  100 mg Oral BID  . pantoprazole  40 mg Oral Daily  . traMADol  50 mg Oral Q6H   Continuous Infusions: . 0.9 % NaCl with KCl 20 mEq / L 100 mL/hr at 01/01/21 0615  . methocarbamol (ROBAXIN) IV 500 mg (12/31/20 1202)     Radiological Exams on Admission: DG Knee Left Port  Result Date: 12/31/2020 CLINICAL DATA:  Post LEFT knee arthroplasty EXAM: PORTABLE LEFT KNEE - 1-2 VIEW COMPARISON:  Portable exam 1039 hours  compared to 04/05/2020 FINDINGS: Components of LEFT knee prosthesis identified in expected positions. No acute fracture, dislocation, or bone destruction. Non fused ossicle at tibial tubercle. Expected soft tissue changes. Mild atherosclerotic calcifications of distal superficial femoral and popliteal arteries. IMPRESSION: LEFT knee prosthesis without acute complication. Electronically Signed   By: Lavonia Dana M.D.   On: 12/31/2020 12:38    Impression/Recommendations Active Problems:   S/P total knee arthroplasty, left     1. Acute hypoxic respiratory failure of unclear etiology a. Desaturates to 83% on room air during physical exam, lung sounds are normal b. Possibly volume overloaded: Has been getting IV fluids since yesterday, history of diastolic CHF and +825 mL since admit -> Check BNP c. Given surgery, tachycardia and hypoxia will check a D dimer i. Addendum: D dimer elevated -> CTA chest to rule out PE d. Has a leukocytosis but is afebrile (likely reactive vs. Steroid induced (dexamethasone) vs. Less likely Infectious)  -> CXR e. Continue incentive spirometry, illustrated good technique at bedside  2. Abnormal EKG  a. Sinus tachycardia, PACs and Q wave in Lead III b. Troponin negative x 3 c. Q wave indicating possibly prior infarct and no sign of active ischemia d. Tachycardia could be from post op pain, alcohol withdrawal, other e. Correct electrolyte abnormalities f. Cardiology consulted by primary team  3. Chronic Diastolic CHF a. EF 05-39% (  01/07/2019) b. Monitor daily weights and intake/output  4. Alcohol abuse a. Drinks 2 shots liquor daily, most recently was 4-5 days ago b. Add on CIWA protocol and check Mg and P  5. S/p Left Total Knee Arthroplasty a. Per primary team  6. Hypertension a. Restart home amlodipine  7. Hyperlipidemia a. Restart home statin   Thank you for this consultation.  Our [facility] hospitalist team will follow the patient with you.   Time  Spent: 1 minutes  Harold Hedge D.O.  [facility] 01/01/2021, 7:20 AM

## 2021-01-01 NOTE — Progress Notes (Signed)
Physical Therapy Treatment Patient Details Name: Bradley Ray MRN: [id number] DOB: 12-17-1955 Today's Date: 01/01/2021    History of Present Illness Patient is a 65 y.o. male s/p Lt TKA on 12/31/2020 with PMH significant for prostate cancer, HTN, HLD, Covid, CKD, asthma, OA, alcohol/drug abuse, ORIF Lt ankle (1980's).    PT Comments    The patient  progressing well. HR resting 109, noted 125  Once returned to bed and  Intermittently up to low 120 with esting. SPO2 89% RA. Patient ambulated x 300'. Has a CPM now at home. Continue PT, Dc once cleared from cardiac  Standpoint. Per Ortho Dc, has OPPT appointment 3/25.  Follow Up Recommendations  Outpatient PT     Equipment Recommendations  None recommended by PT    Recommendations for Other Services       Precautions / Restrictions Precautions Precautions: Fall;Knee Required Braces or Orthoses: Knee Immobilizer - Left Knee Immobilizer - Right: Discontinue once straight leg raise with < 10 degree lag Restrictions Other Position/Activity Restrictions: WBAT    Mobility  Bed Mobility Overal bed mobility: Needs Assistance       Supine to sit: Supervision     General bed mobility comments: used leg lifter  to get leg on/off bed    Transfers Overall transfer level: Needs assistance Equipment used: Rolling walker (2 wheeled) Transfers: Sit to/from Stand Sit to Stand: Supervision         General transfer comment: pt using bil UE for power up . guarding for safety with rise.  Ambulation/Gait Ambulation/Gait assistance: Min guard Gait Distance (Feet): 300 Feet Assistive device: Rolling walker (2 wheeled) Gait Pattern/deviations: Step-through pattern Gait velocity: decr   General Gait Details: patient reports ambulating felt good. less pain   Stairs             Wheelchair Mobility    Modified Rankin (Stroke Patients Only)       Balance           Standing balance support: Bilateral upper extremity  supported;During functional activity Standing balance-Leahy Scale: Fair                              Cognition Arousal/Alertness: Financial controller Sets: AROM;5 reps;Right Heel Slides: AAROM;Right;10 reps;Supine Hip ABduction/ADduction: AAROM;Right;10 reps;Supine Straight Leg Raises: AAROM;Right;10 reps;Supine    General Comments        Pertinent Vitals/Pain Pain Score: 7  Pain Location: 12 Pain Descriptors / Indicators: Aching;Discomfort;Grimacing Pain Intervention(s): Ice applied;Patient requesting pain meds-RN notified    Home Living                      Prior Function            PT Goals (current goals can now be found in the care plan section) Progress towards PT goals: Progressing toward goals    Frequency    7X/week      PT Plan Current plan remains appropriate    Co-evaluation              AM-PAC PT "6 Clicks" Mobility   Outcome Measure  Help needed turning from your back to your side while  in a flat bed without using bedrails?: None Help needed moving from lying on your back to sitting on the side of a flat bed without using bedrails?: None Help needed moving to and from a bed to a chair (including a wheelchair)?: A Little Help needed standing up from a chair using your arms (e.g., wheelchair or bedside chair)?: A Little Help needed to walk in hospital room?: A Little Help needed climbing 3-5 steps with a railing? : A Lot 6 Click Score: 19    End of Session Equipment Utilized During Treatment: Right knee immobilizer Activity Tolerance: Patient tolerated treatment well Patient left: in bed;with call bell/phone within reach Nurse Communication: Mobility status;Patient requests pain meds PT Visit Diagnosis: Muscle weakness (generalized) (M62.81);Difficulty in walking, not elsewhere classified (R26.2);Pain Pain - Right/Left:  Left Pain - part of body: Knee     Time: 9381-0175 PT Time Calculation (min) (ACUTE ONLY): 41 min  Charges:  $Gait Training: 8-22 mins $Therapeutic Exercise: 8-22 mins $Self Care/Home Management: Carmi Pager [phone] Office [phone]    Claretha Cooper 01/01/2021, 3:20 PM

## 2021-01-02 DIAGNOSIS — Z96652 Presence of left artificial knee joint: Secondary | ICD-10-CM | POA: Diagnosis not present

## 2021-01-02 DIAGNOSIS — M1712 Unilateral primary osteoarthritis, left knee: Secondary | ICD-10-CM | POA: Diagnosis not present

## 2021-01-02 LAB — LIPID PANEL
Cholesterol: 111 mg/dL (ref 0–200)
HDL: 48 mg/dL (ref >40)
LDL Cholesterol: 50 mg/dL (ref 0–99)
Total CHOL/HDL Ratio: 2.3 RATIO
Triglycerides: 63 mg/dL (ref <150)
VLDL: 13 mg/dL (ref 0–40)

## 2021-01-02 NOTE — Progress Notes (Signed)
PROGRESS NOTE  Bradley Ray  DOB: Jul 22, 1956  PCP: Nolene Ebbs, MD IRC:[phone]  DOA: 12/31/2020  LOS: 0 days   Chief complaint: Elevated blood pressure  Brief narrative: Bradley Ray is a 65 y.o. male with PMH significant for chronic alcoholism, HTN, HLD, diastolic CHF, prior cocaine use, beta thalassemia, splenic infarct, osteoarthritis asthma. Patient underwent left total knee arthroplasty 12/31/20 by Dr. Percell Morson. Post procedure around 6:30 PM, patient had elevated BP, tachycardia and hypoxia into the 80s on room air which resolved with supplemental O2. An EKG was ordered which showed sinus tachycardia, possible LAE and inferior infarct.  Rapid response was called.  Cardiology and hospitalist service consulted.    Subjective: Patient was seen and examined this morning.  Pleasant middle-aged African-American male.  Sitting up in bed.  Not in distress.  No new symptoms.  No alcohol-related tremors.  No chest pain today. Chart reviewed No fever, remains tachycardic up to 110.  Blood pressure mostly 140s  Assessment/Plan: Acute hypoxic respiratory failure of unclear etiology -Postprocedural hypoxia is probably related to volume overload during the procedure. Oxygen saturation improved with low flow oxygen by nasal cannula.  IV fluid was discontinued -Chest x-ray next morning did not show any acute findings.  BNP normal.  Echo with EF 60 to 65%, no wall motion abnormality -CTA chest ruled out pulmonary embolism. -Currently on room air.  Abnormal EKG  -Sinus tachycardia, PACs and Q wave in Lead III -Troponin negative x 3 -Q wave indicating possibly prior infarct and no sign of active ischemia -Tachycardia could be from post op pain, alcohol withdrawal, other -Electrolyte abnormalities corrected.  Cardiology consultation obtained.  No need of inpatient intervention at this time.  Follow-up as an outpatient.  Chronic Diastolic CHF -EF 51-02% (5/85/2778) -Monitor daily weights  and intake/output  Alcohol abuse -Drinks 2 shots liquor daily, most recently was 4-5 days ago -Add on CIWA protocol and check Mg and P  S/p Left Total Knee Arthroplasty -Per primary team  Hypertension -Restart home amlodipine  Hyperlipidemia -Restart home statin  Mobility: Encourage ambulation. Code Status:   Code Status: Full Code  Nutritional status: Body mass index is 28.73 kg/m.     Diet Order            Diet Heart Room service appropriate? Yes; Fluid consistency: Thin  Diet effective now           Diet - low sodium heart healthy                 DVT prophylaxis: SCDs Start: 12/31/20 1622   Dispo: The patient is from: Home              Anticipated d/c is to: Okay to discharge from medical standpoint            Infusions:  . 0.9 % NaCl with KCl 20 mEq / L 100 mL/hr at 01/02/21 0258  . methocarbamol (ROBAXIN) IV Stopped (01/01/21 1154)    Scheduled Meds: . amLODipine  5 mg Oral Daily  . aspirin  81 mg Oral BID  . atorvastatin  40 mg Oral Daily  . docusate sodium  100 mg Oral BID  . folic acid  1 mg Oral Daily  . multivitamin with minerals  1 tablet Oral Daily  . pantoprazole  40 mg Oral Daily  . thiamine  100 mg Oral Daily   Or  . thiamine  100 mg Intravenous Daily  . traMADol  50 mg Oral Q6H  Antimicrobials: Anti-infectives (From admission, onward)   Start     Dose/Rate Route Frequency Ordered Stop   12/31/20 1400  ceFAZolin (ANCEF) IVPB 2g/100 mL premix        2 g 200 mL/hr over 30 Minutes Intravenous Every 6 hours 12/31/20 1032 01/01/21 0059   12/31/20 0600  ceFAZolin (ANCEF) IVPB 2g/100 mL premix        2 g 200 mL/hr over 30 Minutes Intravenous On call to O.R. 12/31/20 0554 12/31/20 0747      PRN meds: acetaminophen, alum & mag hydroxide-simeth, bisacodyl, diphenhydrAMINE, HYDROmorphone (DILAUDID) injection, LORazepam **OR** LORazepam, magnesium citrate, menthol-cetylpyridinium **OR** phenol, methocarbamol **OR** methocarbamol  (ROBAXIN) IV, metoCLOPramide **OR** metoCLOPramide (REGLAN) injection, ondansetron **OR** ondansetron (ZOFRAN) IV, oxyCODONE, oxyCODONE, polyethylene glycol   Objective: Vitals:   01/01/21 2005 01/02/21 0704  BP: (!) 139/98 (!) 142/92  Pulse: (!) 109 95  Resp: 19 16  Temp: 98.6 F (37 C) (!) 97.4 F (36.3 C)  SpO2: 91% 95%    Intake/Output Summary (Last 24 hours) at 01/02/2021 0833 Last data filed at 01/02/2021 9622 Gross per 24 hour  Intake 2275.68 ml  Output 2325 ml  Net -49.32 ml   Filed Weights   12/31/20 0622  Weight: 80.7 kg   Weight change:  Body mass index is 28.73 kg/m.   Physical Exam: General exam: Pleasant middle-aged African-American male.  Not in distress Skin: No rashes, lesions or ulcers. HEENT: Atraumatic, normocephalic, no obvious bleeding Lungs: Clear to auscultation bilaterally CVS: Regular rate and rhythm, no murmur GI/Abd soft, nontender, nondistended, bowel sound present CNS: Alert, awake oriented x3 Psychiatry: Mood appropriate Extremities: No pedal edema, no calf tenderness.  Data Review: I have personally reviewed the laboratory data and studies available.  Recent Labs  Lab 12/31/20 1016 01/01/21 0425  WBC 9.0 13.9*  NEUTROABS 7.4 10.8*  HGB 12.1* 10.8*  HCT 39.2 35.0*  MCV 66.1* 65.3*  PLT 196 161   Recent Labs  Lab 12/31/20 1828 01/01/21 0425 01/01/21 0807  NA 139 136  --   K 3.2* 3.2*  --   CL 105 104  --   CO2 22 22  --   GLUCOSE 124* 113*  --   BUN 9 6*  --   CREATININE 0.85 0.74  --   CALCIUM 8.7* 8.4*  --   MG  --  1.5*  --   PHOS  --   --  2.9    F/u labs  Unresulted Labs (From admission, onward)         None      Signed, Terrilee Croak, MD [facility] 01/02/2021

## 2021-01-02 NOTE — Plan of Care (Signed)
  Problem: Education: Goal: Knowledge of General Education information will improve Description Including pain rating scale, medication(s)/side effects and non-pharmacologic comfort measures Outcome: Progressing   Problem: Health Behavior/Discharge Planning: Goal: Ability to manage health-related needs will improve Outcome: Progressing   

## 2021-01-02 NOTE — Progress Notes (Signed)
Pt discharged to home, instructions reviewed with pt acknowledged understanding.

## 2021-01-02 NOTE — Discharge Summary (Signed)
Physician Discharge Summary  Patient ID: Bradley Ray MRN: [id number] DOB/AGE: 10/12/1956 65 y.o.  Admit date: 12/31/2020 Discharge date: 01/02/2021  Admission Diagnoses: left knee OA  Discharge Diagnoses:  Active Problems:   S/P total knee arthroplasty, left   Discharged Condition: fair  Hospital Course: Patient had a left total knee arthroplasty on 12/31/20 that went well. He didn't have someone at home to stay the night with him so he was placed in observation overnight. Was notified by patient's RN that his pulse and BP had been running high and his O2 sat was running in the 80s on room air. Placed patient on O2 via [place] and sat went up to high 90s. Ordered EKG that showed sinus tachycardia, possible LAE, and inferior infarct. I had to nurse order a rapid response and paged cardiology.  When I arrived to the patient's bedside, he was sitting up in bed smiling and talking. He denies any CP, chest tightness, SOB, abd pain, N/V, headache, dizziness, anxiety.  Consulted cardiology and internal medicine.  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 Can f/u outpatient  Consults: cardiology and hospitalist  Significant Diagnostic Studies: normal Troponin, elevated D dimer, normal chest CTA  Treatments: IV hydration, analgesia: acetaminophen, Dilaudid and Morphine and surgery: left total knee replacement  Discharge Exam: Blood pressure (!) 142/92, pulse 95, temperature (!) 97.4 F (36.3 C), resp. rate 16, height 5\' 6"  (1.676 m), weight 80.7 kg, SpO2 95 %. General appearance: alert, cooperative and no distress Head: Normocephalic, without obvious abnormality, atraumatic Resp: clear to auscultation bilaterally Cardio: tachycardic GI: soft, non-tender; bowel sounds normal; no masses,  no organomegaly Extremities: extremities normal, atraumatic, no cyanosis or edema Pulses: 2+ and symmetric Incision/Wound:covered with bandage and ACE wrap  Disposition: Discharge disposition: 01-Home or  Self Care       Discharge Instructions    Call MD / Call 911   Complete by: As directed    If you experience chest pain or shortness of breath, CALL 911 and be transported to the hospital emergency room.  If you develope a fever above 101 F, pus (white drainage) or increased drainage or redness at the wound, or calf pain, call your surgeon's office.   Call MD / Call 911   Complete by: As directed    If you experience chest pain or shortness of breath, CALL 911 and be transported to the hospital emergency room.  If you develope a fever above 101 F, pus (white drainage) or increased drainage or redness at the wound, or calf pain, call your surgeon's office.   Diet - low sodium heart healthy   Complete by: As directed    Diet - low sodium heart healthy   Complete by: As directed    Discharge instructions   Complete by: As directed    You may bear weight as tolerated. Keep your dressing on and dry until follow up. Take medicine to prevent blood clots as directed. Take pain medicine as needed with the goal of transitioning to over the counter medicines.    INSTRUCTIONS AFTER JOINT REPLACEMENT   Remove items at home which could result in a fall. This includes throw rugs or furniture in walking pathways ICE to the affected joint every three hours while awake for 30 minutes at a time, for at least the first 3-5 days, and then as needed for pain and swelling.  Continue to use ice for pain and swelling. You may notice swelling that will progress down to the foot  and ankle.  This is normal after surgery.  Elevate your leg when you are not up walking on it.   Continue to use the breathing machine you got in the hospital (incentive spirometer) which will help keep your temperature down.  It is common for your temperature to cycle up and down following surgery, especially at night when you are not up moving around and exerting yourself.  The breathing machine keeps your lungs expanded and your  temperature down.   DIET:  As you were doing prior to hospitalization, we recommend a well-balanced diet.  DRESSING / WOUND CARE / SHOWERING  You may shower 3 days after surgery, but keep the wounds dry during showering.  DO NOT REMOVE YOUR DRESSING! You may use an occlusive plastic wrap (Press'n Seal for example) with blue painter's tape at edges to cover the bandage. NO SOAKING/SUBMERGING IN THE BATHTUB.  If the bandage gets wet, call the office.   ACTIVITY  Increase activity slowly as tolerated, but follow the weight bearing instructions below.   No driving for 6 weeks or until further direction given by your physician.  You cannot drive while taking narcotics.  No lifting or carrying greater than 10 lbs. until further directed by your surgeon. Avoid periods of inactivity such as sitting longer than an hour when not asleep. This helps prevent blood clots.  You may return to work once you are authorized by your doctor.    WEIGHT BEARING   Weight bearing as tolerated with assist device (walker, cane, etc) as directed, use it as long as suggested by your surgeon or therapist, typically at least 4-6 weeks.   EXERCISES  Results after joint replacement surgery are often greatly improved when you follow the exercise, range of motion and muscle strengthening exercises prescribed by your doctor. Safety measures are also important to protect the joint from further injury. Any time any of these exercises cause you to have increased pain or swelling, decrease what you are doing until you are comfortable again and then slowly increase them. If you have problems or questions, call your caregiver or physical therapist for advice.   Rehabilitation is important following a joint replacement. After just a few days of immobilization, the muscles of the leg can become weakened and shrink (atrophy).  These exercises are designed to build up the tone and strength of the thigh and leg muscles and to improve  motion. Often times heat used for twenty to thirty minutes before working out will loosen up your tissues and help with improving the range of motion but do not use heat for the first two weeks following surgery (sometimes heat can increase post-operative swelling).   These exercises can be done on a training (exercise) mat, on the floor, on a table or on a bed. Use whatever works the best and is most comfortable for you.    Use music or television while you are exercising so that the exercises are a pleasant break in your day. This will make your life better with the exercises acting as a break in your routine that you can look forward to.   Perform all exercises about fifteen times, three times per day or as directed.  You should exercise both the operative leg and the other leg as well.  Exercises include:   Quad Sets - Tighten up the muscle on the front of the thigh (Quad) and hold for 5-10 seconds.   Straight Leg Raises - With your knee straight (if you  were given a brace, keep it on), lift the leg to 60 degrees, hold for 3 seconds, and slowly lower the leg.  Perform this exercise against resistance later as your leg gets stronger.  Leg Slides: Lying on your back, slowly slide your foot toward your buttocks, bending your knee up off the floor (only go as far as is comfortable). Then slowly slide your foot back down until your leg is flat on the floor again.  Angel Wings: Lying on your back spread your legs to the side as far apart as you can without causing discomfort.  Hamstring Strength:  Lying on your back, push your heel against the floor with your leg straight by tightening up the muscles of your buttocks.  Repeat, but this time bend your knee to a comfortable angle, and push your heel against the floor.  You may put a pillow under the heel to make it more comfortable if necessary.   A rehabilitation program following joint replacement surgery can speed recovery and prevent re-injury in the  future due to weakened muscles. Contact your doctor or a physical therapist for more information on knee rehabilitation.    CONSTIPATION  Constipation is defined medically as fewer than three stools per week and severe constipation as less than one stool per week.  Even if you have a regular bowel pattern at home, your normal regimen is likely to be disrupted due to multiple reasons following surgery.  Combination of anesthesia, postoperative narcotics, change in appetite and fluid intake all can affect your bowels.   YOU MUST use at least one of the following options; they are listed in order of increasing strength to get the job done.  They are all available over the counter, and you may need to use some, POSSIBLY even all of these options:    Drink plenty of fluids (prune juice may be helpful) and high fiber foods Colace 100 mg by mouth twice a day  Senokot for constipation as directed and as needed Dulcolax (bisacodyl), take with full glass of water  Miralax (polyethylene glycol) once or twice a day as needed.  If you have tried all these things and are unable to have a bowel movement in the first 3-4 days after surgery call either your surgeon or your primary doctor.    If you experience loose stools or diarrhea, hold the medications until you stool forms back up.  If your symptoms do not get better within 1 week or if they get worse, check with your doctor.  If you experience "the worst abdominal pain ever" or develop nausea or vomiting, please contact the office immediately for further recommendations for treatment.   ITCHING:  If you experience itching with your medications, try taking only a single pain pill, or even half a pain pill at a time.  You can also use Benadryl over the counter for itching or also to help with sleep.   TED HOSE STOCKINGS:  Use stockings on both legs until for at least 2 weeks or as directed by physician office. They may be removed at night for  sleeping.  MEDICATIONS:  See your medication summary on the "After Visit Summary" that nursing will review with you.  You may have some home medications which will be placed on hold until you complete the course of blood thinner medication.  It is important for you to complete the blood thinner medication as prescribed.  Take medicines as prescribed.   You have several different medicines that  work in different ways. - Ibuprofen is to reduce pain / inflammation. - Robaxin is for muscle spasms - Oxycodone is a narcotic pain medicine.  Take this for severe pain. This medicine can be dehydrating / constipating. - Zofran is for nausea and vomiting. - Omeprazole is for gastric protection while taking pain medicines.  - Aspirin is to prevent blood clots after surgery.   PRECAUTIONS:  If you experience chest pain or shortness of breath - call 911 immediately for transfer to the hospital emergency department.   If you develop a fever greater that 101 F, purulent drainage from wound, increased redness or drainage from wound, foul odor from the wound/dressing, or calf pain - CONTACT YOUR SURGEON.                                                   FOLLOW-UP APPOINTMENTS:  If you do not already have a post-op appointment, please call the office [phone] for an appointment to be seen by Dr. Percell Burciaga in 2 weeks.   OTHER INSTRUCTIONS:   MAKE SURE YOU:  Understand these instructions.  Get help right away if you are not doing well or get worse.    Thank you for letting us be a part of your medical care team.  It is a privilege we respect greatly.  We hope these instructions will help you stay on track for a fast and full recovery!   Discharge instructions   Complete by: As directed    You may bear weight as tolerated. Keep your dressing on and dry until follow up. Take medicine to prevent blood clots as directed. Take pain medicine as needed with the goal of transitioning to over the counter  medicines.    INSTRUCTIONS AFTER JOINT REPLACEMENT   Remove items at home which could result in a fall. This includes throw rugs or furniture in walking pathways ICE to the affected joint every three hours while awake for 30 minutes at a time, for at least the first 3-5 days, and then as needed for pain and swelling.  Continue to use ice for pain and swelling. You may notice swelling that will progress down to the foot and ankle.  This is normal after surgery.  Elevate your leg when you are not up walking on it.   Continue to use the breathing machine you got in the hospital (incentive spirometer) which will help keep your temperature down.  It is common for your temperature to cycle up and down following surgery, especially at night when you are not up moving around and exerting yourself.  The breathing machine keeps your lungs expanded and your temperature down.   DIET:  As you were doing prior to hospitalization, we recommend a well-balanced diet.  DRESSING / WOUND CARE / SHOWERING  You may shower 3 days after surgery, but keep the wounds dry during showering.  You may use an occlusive plastic wrap (Press'n Seal for example) with blue painter's tape at edges, NO SOAKING/SUBMERGING IN THE BATHTUB.  If the bandage gets wet, change with a clean dry gauze.  If the incision gets wet, pat the wound dry with a clean towel.  ACTIVITY  Increase activity slowly as tolerated, but follow the weight bearing instructions below.   No driving for 6 weeks or until further direction given by your physician.  You  cannot drive while taking narcotics.  No lifting or carrying greater than 10 lbs. until further directed by your surgeon. Avoid periods of inactivity such as sitting longer than an hour when not asleep. This helps prevent blood clots.  You may return to work once you are authorized by your doctor.    WEIGHT BEARING   Weight bearing as tolerated with assist device (walker, cane, etc) as directed,  use it as long as suggested by your surgeon or therapist, typically at least 4-6 weeks.   EXERCISES  Results after joint replacement surgery are often greatly improved when you follow the exercise, range of motion and muscle strengthening exercises prescribed by your doctor. Safety measures are also important to protect the joint from further injury. Any time any of these exercises cause you to have increased pain or swelling, decrease what you are doing until you are comfortable again and then slowly increase them. If you have problems or questions, call your caregiver or physical therapist for advice.   Rehabilitation is important following a joint replacement. After just a few days of immobilization, the muscles of the leg can become weakened and shrink (atrophy).  These exercises are designed to build up the tone and strength of the thigh and leg muscles and to improve motion. Often times heat used for twenty to thirty minutes before working out will loosen up your tissues and help with improving the range of motion but do not use heat for the first two weeks following surgery (sometimes heat can increase post-operative swelling).   These exercises can be done on a training (exercise) mat, on the floor, on a table or on a bed. Use whatever works the best and is most comfortable for you.    Use music or television while you are exercising so that the exercises are a pleasant break in your day. This will make your life better with the exercises acting as a break in your routine that you can look forward to.   Perform all exercises about fifteen times, three times per day or as directed.  You should exercise both the operative leg and the other leg as well.  Exercises include:   Quad Sets - Tighten up the muscle on the front of the thigh (Quad) and hold for 5-10 seconds.   Straight Leg Raises - With your knee straight (if you were given a brace, keep it on), lift the leg to 60 degrees, hold for 3  seconds, and slowly lower the leg.  Perform this exercise against resistance later as your leg gets stronger.  Leg Slides: Lying on your back, slowly slide your foot toward your buttocks, bending your knee up off the floor (only go as far as is comfortable). Then slowly slide your foot back down until your leg is flat on the floor again.  Angel Wings: Lying on your back spread your legs to the side as far apart as you can without causing discomfort.  Hamstring Strength:  Lying on your back, push your heel against the floor with your leg straight by tightening up the muscles of your buttocks.  Repeat, but this time bend your knee to a comfortable angle, and push your heel against the floor.  You may put a pillow under the heel to make it more comfortable if necessary.   A rehabilitation program following joint replacement surgery can speed recovery and prevent re-injury in the future due to weakened muscles. Contact your doctor or a physical therapist for more information  on knee rehabilitation.    CONSTIPATION  Constipation is defined medically as fewer than three stools per week and severe constipation as less than one stool per week.  Even if you have a regular bowel pattern at home, your normal regimen is likely to be disrupted due to multiple reasons following surgery.  Combination of anesthesia, postoperative narcotics, change in appetite and fluid intake all can affect your bowels.   YOU MUST use at least one of the following options; they are listed in order of increasing strength to get the job done.  They are all available over the counter, and you may need to use some, POSSIBLY even all of these options:    Drink plenty of fluids (prune juice may be helpful) and high fiber foods Colace 100 mg by mouth twice a day  Senokot for constipation as directed and as needed Dulcolax (bisacodyl), take with full glass of water  Miralax (polyethylene glycol) once or twice a day as needed.  If you  have tried all these things and are unable to have a bowel movement in the first 3-4 days after surgery call either your surgeon or your primary doctor.    If you experience loose stools or diarrhea, hold the medications until you stool forms back up.  If your symptoms do not get better within 1 week or if they get worse, check with your doctor.  If you experience "the worst abdominal pain ever" or develop nausea or vomiting, please contact the office immediately for further recommendations for treatment.   ITCHING:  If you experience itching with your medications, try taking only a single pain pill, or even half a pain pill at a time.  You can also use Benadryl over the counter for itching or also to help with sleep.   TED HOSE STOCKINGS:  Use stockings on both legs until for at least 2 weeks or as directed by physician office. They may be removed at night for sleeping.  MEDICATIONS:  See your medication summary on the "After Visit Summary" that nursing will review with you.  You may have some home medications which will be placed on hold until you complete the course of blood thinner medication.  It is important for you to complete the blood thinner medication as prescribed.  Take medicines as prescribed.   You have several different medicines that work in different ways. - Ibuprofen is to reduce pain / inflammation - Robaxin is for muscle spasms - Oxycodone is a narcotic pain medicine.  Take this for severe pain. This medicine can be dehydrating / constipating. - Zofran is for nausea and vomiting. - Aspirin is to prevent blood clots after surgery.  - Omeprazole is for gastric protection while taking pain medicines.  PRECAUTIONS:  If you experience chest pain or shortness of breath - call 911 immediately for transfer to the hospital emergency department.   If you develop a fever greater that 101 F, purulent drainage from wound, increased redness or drainage from wound, foul odor from the  wound/dressing, or calf pain - CONTACT YOUR SURGEON.                                                   FOLLOW-UP APPOINTMENTS:  If you do not already have a post-op appointment, please call the office [phone] for an appointment to be  seen by Dr. Percell Magos in 2 weeks.   OTHER INSTRUCTIONS:   MAKE SURE YOU:  Understand these instructions.  Get help right away if you are not doing well or get worse.    Thank you for letting us be a part of your medical care team.  It is a privilege we respect greatly.  We hope these instructions will help you stay on track for a fast and full recovery!     Allergies as of 01/02/2021      Reactions   Morphine And Related Rash      Medication List    TAKE these medications   ALAWAY OP Place 1 drop into both eyes daily as needed (itchy eyes).   albuterol 108 (90 Base) MCG/ACT inhaler Commonly known as: VENTOLIN HFA Inhale 2 puffs into the lungs every 6 (six) hours as needed for wheezing or shortness of breath.   amLODipine 5 MG tablet Commonly known as: NORVASC Take 5 mg by mouth daily.   aspirin EC 81 MG tablet Take 1 tablet (81 mg total) by mouth in the morning and at bedtime. To prevent blood clots after surgery What changed:   when to take this  additional instructions   atorvastatin 40 MG tablet Commonly known as: Lipitor Take 1 tablet (40 mg total) by mouth daily.   folic acid 1 MG tablet Commonly known as: FOLVITE Take 1 tablet (1 mg total) by mouth daily.   ibuprofen 800 MG tablet Commonly known as: ADVIL Take 1 tablet (800 mg total) by mouth every 8 (eight) hours as needed for mild pain or moderate pain.   methocarbamol 500 MG tablet Commonly known as: Robaxin Take 1 tablet (500 mg total) by mouth every 8 (eight) hours as needed for muscle spasms.   omeprazole 20 MG tablet Commonly known as: PriLOSEC OTC Take 1 tablet (20 mg total) by mouth daily. For gastric protection while taking NSAIDs   ondansetron 4 MG  tablet Commonly known as: Zofran Take 1 tablet (4 mg total) by mouth daily as needed for nausea or vomiting.   OVER THE COUNTER MEDICATION Apply 1 application topically daily as needed (pain). Hempvana cream   oxyCODONE 5 MG immediate release tablet Commonly known as: Roxicodone Take 1 tablet (5 mg total) by mouth every 6 (six) hours as needed for severe pain.   thiamine 100 MG tablet Commonly known as: Vitamin B-1 Take 100 mg by mouth daily.       Follow-up Information    [name], MD. Schedule an appointment as soon as possible for a visit in 1 week.   Specialty: Orthopedic Surgery Contact information: [address] [place] [id number] [phone]        Jerline Pain, MD Follow up on 03/05/2021.   Specialty: Cardiology Why: Pleasea arrive 15 minutes early for your 9:40am post-hospital cardiology appointment Contact information: [address] [place] [address] [phone]               Signed: Alisa Graff 01/02/2021, 2:42 PM

## 2021-01-02 NOTE — Progress Notes (Signed)
    Subjective: Patient reports pain as severe. Reports he slept well an didn't have severe pain until this morning. Tolerating diet. Urinating. No CP, SOB. Worked with PT on mobilization OOB.   Objective:   VITALS:   Vitals:   01/01/21 0600 01/01/21 1325 01/01/21 2005 01/02/21 0704  BP:  (!) 145/92 (!) 139/98 (!) 142/92  Pulse:  (!) 104 (!) 109 95  Resp: 20 18 19 16   Temp:  98 F (36.7 C) 98.6 F (37 C) (!) 97.4 F (36.3 C)  TempSrc:  Oral    SpO2: 95% 90% 91% 95%  Weight:      Height:       CBC Latest Ref Rng & Units 01/01/2021 12/31/2020 12/25/2020  WBC 4.0 - 10.5 K/uL 13.9(H) 9.0 6.6  Hemoglobin 13.0 - 17.0 g/dL 10.8(L) 12.1(L) 13.0  Hematocrit 39.0 - 52.0 % 35.0(L) 39.2 42.3  Platelets 150 - 400 K/uL 161 196 269   BMP Latest Ref Rng & Units 01/01/2021 12/31/2020 12/25/2020  Glucose 70 - 99 mg/dL 113(H) 124(H) 104(H)  BUN 8 - 23 mg/dL 6(L) 9 14  Creatinine 0.61 - 1.24 mg/dL 0.74 0.85 0.91  Sodium 135 - 145 mmol/L 136 139 139  Potassium 3.5 - 5.1 mmol/L 3.2(L) 3.2(L) 3.6  Chloride 98 - 111 mmol/L 104 105 105  CO2 22 - 32 mmol/L 22 22 24   Calcium 8.9 - 10.3 mg/dL 8.4(L) 8.7(L) 9.5   Intake/Output      03/23 0701 03/24 0700 03/24 0701 03/25 0700   P.O.     I.V. (mL/kg) 2275.7 (28.2)    IV Piggyback 0    Total Intake(mL/kg) 2275.7 (28.2)    Urine (mL/kg/hr) 2325 (1.2)    Stool     Total Output 2325    Net -49.3            Physical Exam: General: sitting up in bed, writhing in pain, arched back, frequently groaning, breathing through pursed lips Resp: breathing heavily Cardio: tachycardic ABD soft, tender LLQ for a few moments during some muscle spasms Neurologically intact MSK Neurovascularly intact Sensation intact distally Intact pulses distally Dorsiflexion/Plantar flexion intact Incision: dressing C/D/I Compartments soft  Assessment: 2 Days Post-Op  S/P Procedure(s) (LRB): TOTAL KNEE ARTHROPLASTY (Left) by Dr. Ernesta Amble. Murphy on  12/31/20  Active Problems:   S/P total knee arthroplasty, left   Plan: Need to get better control of pain. Spoke with his RN about giving additional medicine Up with therapy Incentive Spirometry Elevate and Apply ice  Weightbearing: WBAT LLE Insicional and dressing care: Dressings left intact until follow-up and Reinforce dressings as needed Orthopedic device(s): None Showering: Keep dressing dry VTE prophylaxis: Aspirin 81mg  BID x 30 days post op, SCDs, ambulation Pain control: Tylenol, Oxy Follow - up plan: 1 week Contact information:  Edmonia Lynch MD, Aggie Moats PA-C  Dispo: Home today once pain controlled     Alisa Graff Office [phone] 01/02/2021, 7:21 AM

## 2021-01-02 NOTE — Progress Notes (Signed)
Physical Therapy Treatment Patient Details Name: Bradley Ray MRN: [id number] DOB: 1956-08-04 Today's Date: 01/02/2021    History of Present Illness Patient is a 65 y.o. male s/p Lt TKA on 12/31/2020 with PMH significant for prostate cancer, HTN, HLD, Covid, CKD, asthma, OA, alcohol/drug abuse, ORIF Lt ankle (1980's).    PT Comments    POD # 2 am session Assisted OOB.  General bed mobility comments: used leg lifter  to get leg on/off bed.  General transfer comment: pt using bil UE for power up . guarding for safety with rise. Amb a great distance in hallway.  Then returned to room to perform some TE's following HEP handout.  Instructed on proper tech, freq as well as use of ICE.   Addressed all mobility questions, discussed appropriate activity, educated on use of ICE.  Pt ready for D/C to home.   Follow Up Recommendations  Outpatient PT     Equipment Recommendations  None recommended by PT    Recommendations for Other Services       Precautions / Restrictions Precautions Precaution Comments: instructed no pillow under knee Restrictions Weight Bearing Restrictions: No Other Position/Activity Restrictions: WBAT    Mobility  Bed Mobility Overal bed mobility: Needs Assistance       Supine to sit: Supervision     General bed mobility comments: used leg lifter  to get leg on/off bed    Transfers Overall transfer level: Needs assistance Equipment used: Rolling walker (2 wheeled) Transfers: Sit to/from Stand Sit to Stand: Supervision         General transfer comment: pt using bil UE for power up . guarding for safety with rise.  Ambulation/Gait Ambulation/Gait assistance: Supervision Gait Distance (Feet): 285 Feet Assistive device: Rolling walker (2 wheeled) Gait Pattern/deviations: Step-through pattern Gait velocity: decrease   General Gait Details: "feels good to walk".   Stairs             Wheelchair Mobility    Modified Rankin (Stroke Patients  Only)       Balance                                            Cognition Arousal/Alertness: Awake/alert Behavior During Therapy: WFL for tasks assessed/performed Overall Cognitive Status: Within Functional Limits for tasks assessed                                 General Comments: AxO x 3 very pleasant      Exercises   Total Knee Replacement TE's following HEP handout 10 reps B LE ankle pumps 05 reps towel squeezes 05 reps knee presses 05 reps heel slides  05 reps SAQ's 05 reps SLR's 05 reps ABD Educated on use of gait belt to assist with TE's Followed by ICE    General Comments        Pertinent Vitals/Pain Pain Assessment: 0-10 Pain Score: 3  Pain Location: L knee Pain Descriptors / Indicators: Aching;Discomfort;Grimacing;Operative site guarding Pain Intervention(s): Monitored during session;Premedicated before session;Repositioned;Ice applied    Home Living                      Prior Function            PT Goals (current goals can now be found in the care plan  section) Progress towards PT goals: Progressing toward goals    Frequency    7X/week      PT Plan Current plan remains appropriate    Co-evaluation              AM-PAC PT "6 Clicks" Mobility   Outcome Measure  Help needed turning from your back to your side while in a flat bed without using bedrails?: None Help needed moving from lying on your back to sitting on the side of a flat bed without using bedrails?: None Help needed moving to and from a bed to a chair (including a wheelchair)?: None Help needed standing up from a chair using your arms (e.g., wheelchair or bedside chair)?: None Help needed to walk in hospital room?: None Help needed climbing 3-5 steps with a railing? : A Little 6 Click Score: 23    End of Session Equipment Utilized During Treatment: Gait belt Activity Tolerance: Patient tolerated treatment well Patient left: in  chair;with call bell/phone within reach Nurse Communication: Mobility status (pt ready for D/C to home) PT Visit Diagnosis: Muscle weakness (generalized) (M62.81);Difficulty in walking, not elsewhere classified (R26.2);Pain Pain - Right/Left: Left Pain - part of body: Knee     Time: 0950-1030 PT Time Calculation (min) (ACUTE ONLY): 40 min  Charges:  $Gait Training: 8-22 mins $Therapeutic Exercise: 8-22 mins $Therapeutic Activity: 8-22 mins                     {Andilynn Delavega  PTA Acute  Rehabilitation Services Pager      [phone] Office      [phone]

## 2021-01-03 ENCOUNTER — Ambulatory Visit: Payer: Medicaid Other | Attending: Orthopedic Surgery | Admitting: Physical Therapy

## 2021-01-03 ENCOUNTER — Encounter: Payer: Self-pay | Admitting: Physical Therapy

## 2021-01-03 ENCOUNTER — Other Ambulatory Visit: Payer: Self-pay

## 2021-01-03 DIAGNOSIS — Z96652 Presence of left artificial knee joint: Secondary | ICD-10-CM

## 2021-01-03 DIAGNOSIS — M25562 Pain in left knee: Secondary | ICD-10-CM | POA: Insufficient documentation

## 2021-01-03 DIAGNOSIS — G8929 Other chronic pain: Secondary | ICD-10-CM | POA: Insufficient documentation

## 2021-01-03 DIAGNOSIS — R600 Localized edema: Secondary | ICD-10-CM | POA: Insufficient documentation

## 2021-01-03 DIAGNOSIS — M25662 Stiffness of left knee, not elsewhere classified: Secondary | ICD-10-CM | POA: Diagnosis present

## 2021-01-03 DIAGNOSIS — M6281 Muscle weakness (generalized): Secondary | ICD-10-CM | POA: Diagnosis present

## 2021-01-03 DIAGNOSIS — R2689 Other abnormalities of gait and mobility: Secondary | ICD-10-CM | POA: Diagnosis present

## 2021-01-03 NOTE — Therapy (Signed)
[place], [place], [address] Phone: [phone]   Fax:  [phone]  Physical Therapy Evaluation  Patient Details  Name: Bradley Ray MRN: [id number] Date of Birth: 14-May-1956 Referring Provider (PT): [name], MD   Encounter Date: 01/03/2021   PT End of Session - 01/03/21 1026    Visit Number 1    Number of Visits 17    Date for PT Re-Evaluation 02/28/21    Authorization Type [employer] managed MCD    PT Start Time [phone]   pt arrived late   PT Stop Time 1016    PT Time Calculation (min) 33 min    Activity Tolerance Patient tolerated treatment well;Patient limited by pain    Behavior During Therapy [facility] for tasks assessed/performed           Past Medical History:  Diagnosis Date  . Alcoholism ([place])    per epic documentation long hx dependence--- (06-30-2019  per pt was drinking average 3 shots dialy until 03/ 2020, since then one beer weekly)  . Arthritis    knees  . Asthma   . Beta thalassemia trait    per hematologist/ oncologist note- dr Dorita Sciara--  per blood work-up  dx 02/ 2017  . Bladder neck contracture   . BPH with obstruction/lower urinary tract symptoms   . Chronic kidney disease    Hx: of Hemodyalysis  . Cocaine abuse, in remission ([place])    06-30-2019   per pt states last used 12/ 2017  . COVID 11/09/2019   loss of taste and smell and appetite x 14 days all symptoms resolved  . ED (erectile dysfunction)   . Hepatitis    hepatitis a age 65  . History of acute renal failure 12/2018   kidneys fully recovered no nephrologist per pt  . History of chest pain    01-06-2019 hospital admission-- dx acute renal failure, hypotensive with associated shock liver, SIRS with tachycardia/ tachyapnea,  chf diastolic  ;   [phone]  per pt no chest pain since and everything resolved , followed by pcp  . Hyperlipidemia   . Hypertension   . Prostate cancer [facility]) urologist-  dr wrenn/  oncologist-   dr Tammi Klippel   dx 07-02-2016,  T1c,  Gleason 3+3,  PSA 6.48,  48.44cc  . 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 Wears dentures    upper    Past Surgical History:  Procedure Laterality Date  . CYSTOSCOPY N/A 10/15/2016   Procedure: CYSTOSCOPY FLEXIBLE;  Surgeon: [name], MD;  Location: [facility];  Service: Urology;  Laterality: N/A;  NO SEEDS FOUND IN BLADDER  . CYSTOSCOPY N/A 08/01/2020   Procedure: CYSTOSCOPY/removal of bladder stone and bladder neck biopsy;  Surgeon: [name], MD;  Location: [facility];  Service: Urology;  Laterality: N/A;  . IR FLUORO GUIDE CV LINE RIGHT  01/09/2019  . IR US GUIDE VASC ACCESS RIGHT  01/09/2019  . ORIF LEFT ANKLE  1980's  . PROSTATE BIOPSY    . RADIOACTIVE SEED IMPLANT N/A 10/15/2016   Procedure: RADIOACTIVE SEED IMPLANT/BRACHYTHERAPY IMPLANT;  Surgeon: [name], MD;  Location: [facility];  Service: Urology;  Laterality: N/A;     66    SEEDS IMPLANTED  . REPAIR EXTENSOR  TENDON Right 06/02/2016   Procedure: RIGHT LONG FINGER EXTENSOR TENDON REPAIR;  Surgeon: Milly Jakob, MD;  Location: [facility];  Service: Orthopedics;  Laterality: Right;  . TOTAL KNEE ARTHROPLASTY Right 2010  approx.  Marland Kitchen TOTAL KNEE ARTHROPLASTY Left 12/31/2020   Procedure: TOTAL KNEE ARTHROPLASTY;  Surgeon: [name], MD;  Location: [facility];  Service: Orthopedics;  Laterality: Left;  . TRANSURETHRAL INCISION OF BLADDER NECK N/A 07/04/2019   Procedure: CYSTOSCOPY WITH INCISION OF BLADDER NECK CONTRACTURE, BALLOON DILITATION;  Surgeon: [name], MD;  Location: [facility];  Service: Urology;  Laterality: N/A;  . TRANSURETHRAL INCISION OF BLADDER NECK N/A 08/01/2020   Procedure: INCISION OF BLADDER NECK;  Surgeon: [name], MD;  Location: [facility];  Service: Urology;  Laterality: N/A;  1 HR  . TRANSURETHRAL RESECTION OF PROSTATE N/A 06/16/2018   Procedure: TRANSURETHRAL RESECTION OF THE PROSTATE (TURP);  Surgeon: [name], MD;  Location: [facility];  Service: Urology;  Laterality: N/A;    There were no vitals filed for this visit.    Subjective Assessment - 01/03/21 0949    Subjective pt is 65 y.o s/p L TKA on 12/31/20, and was discharged from the hospital yesterday. he reports he has been using a CPM to get the rom. He reports the pain isn't bad today with meds in his system. he currently amb with RW and pain stays mostly in the L knee.    Limitations Standing;Walking    How long can you sit comfortably? 20 min    How long can you stand comfortably? 5 min    How long can you walk comfortably? 71 mi n    Patient Stated Goals get back to mowing the lawn, get back to hitting the dance floor.    Currently in Pain? Yes    Pain Score 4    last took pain meds at 6 am, max pain 8/10   Pain Location Knee    Pain Orientation Left    Pain Descriptors / Indicators Aching    Pain Type Surgical pain    Pain Onset More than a month ago    Pain Frequency Intermittent    Aggravating Factors  sitting, prolonged standing    Pain Relieving Factors exercise    Effect of Pain on Daily Activities limited positional tolerance              [facility] PT Assessment - 01/03/21 0001      Assessment   Medical Diagnosis L TKA    Referring Provider (PT) [name], MD    Onset Date/Surgical Date 12/31/20    Hand Dominance Right    Next MD Visit unsure    Prior Therapy yes      Precautions   Precautions None      Restrictions   Weight Bearing Restrictions No      Balance Screen   Has the patient fallen in the past 6 months No    Has the patient had a decrease in activity level because of a fear of falling?  No    Is the patient reluctant to leave their home because of a fear of falling?  No      Home Environment    Living Environment Private residence    Living Arrangements Alone    Type of [place] One level    Home Equipment Crutches;Cane - single point;Walker -  2 wheels;Toilet riser      Prior Function   Level of Independence Independent with basic ADLs    Vocation On disability    Leisure dancing, having fun with friends      Observation/Other Assessments   Other Surveys  Lower Extremity Functional Scale    Lower Extremity Functional Scale  12/80      Posture/Postural Control   Posture/Postural Control Postural limitations    Postural Limitations Rounded Shoulders;Forward head      ROM / Strength   AROM / PROM / Strength AROM;Strength;PROM      AROM   AROM Assessment Site Knee    Right/Left Knee Right;Left    Right Knee Extension 126    Right Knee Flexion 0    Left Knee Extension 15    Left Knee Flexion 55      PROM   PROM Assessment Site Knee    Right/Left Knee Left    Left Knee Extension 12    Left Knee Flexion 70      Strength   Overall Strength Due to pain;Unable to assess    Strength Assessment Site Knee;Hip    Right/Left Hip Right;Left      Palpation   Palpation comment TTP along the popliteal fossa and distal hamstrings as well as peripatellar      Ambulation/Gait   Assistive device Rolling walker    Gait Pattern Step-to pattern;Decreased stride length;Decreased stance time - left;Decreased step length - right;Antalgic;Trendelenburg                      Objective measurements completed on examination: See above findings.               PT Education - 01/03/21 1025    Education Details evaluation findings, POC, goals, HEP with proper form/ rationale.    Person(s) Educated Patient    Methods Explanation;Verbal cues;Handout    Comprehension Verbalized understanding;Verbal cues required            PT Short Term Goals - 01/03/21 1030      PT SHORT TERM GOAL #1   Title pt to be IND with  inital HEP    Baseline no previous HEP    Time 4    Period Weeks    Status New    Target Date 01/31/21      PT SHORT TERM GOAL #2   Title increase knee AROM total arc to >/= 10 - 90 degrees    Baseline see flowsheet    Time 4    Period Weeks    Status New    Target Date 02/14/21      PT SHORT TERM GOAL #3   Title reduce max pain to </= 4/10 for functional and therapeutic progression    Baseline max pain 8/10    Time 4    Period Weeks    Status New    Target Date 01/31/21             PT Long Term Goals - 01/03/21 1032      PT LONG TERM GOAL #1   Title increase L knee total arc ROM to >/= 5 - 120 for functional ROM required for efficient gait and general mobility    Baseline see flow sheet    Time 8    Period Weeks    Status New    Target Date 02/28/21      PT LONG TERM GOAL #2   Title increase  gross LLE strength to >/= 4+/5 to promote stability and safety with walking/ standing    Baseline unable to assess strength due to pain    Time 8    Period Weeks    Status New    Target Date 02/28/21      PT LONG TERM GOAL #3   Title pt to be abel to sit, stand and walk for >/= 45 min with LRAD for in home and short community distances with max pain of </= 2/10    Baseline sit 20 min, stand 5 min , walk 20 min    Time 8    Period Weeks    Status New    Target Date 02/28/21      PT LONG TERM GOAL #4   Title improve LEFS score to >/= 50/ 80 to demo improvement in function    Baseline inital score 12/80    Time 8    Status New    Target Date 02/28/21      PT LONG TERM GOAL #5   Title pt to be IND with all HEP given and is able to maintain and progress current ROM    Baseline no previous HEP    Time 8    Period Weeks    Status New    Target Date 02/28/21                  Plan - 01/03/21 1011    Clinical Impression Statement pt is a pleasant 65 y.o M s/p L TKA on 12/31/2020, and was discharged yesterday. he demonstrages gross limited knee ROM with total  ARC 15 - 55 degrees, limited secondary to pain and swelling which is expected following surgery. unable to assess strength secondary to limited ROM and pain. He current ambulates with RW utilizing step to pattern with limited stride. He would benefit from physical therapy to decreaes L knee pain, improve ROM, promote strength, maximize gait efficiency and overall function by addressing the deficits listed.    Personal Factors and Comorbidities Comorbidity 3+    Comorbidities hx of CKD, hypertension, CX    Examination-Activity Limitations Locomotion Level;Stand;Lift;Squat    Stability/Clinical Decision Making Evolving/Moderate complexity    Clinical Decision Making Moderate    Rehab Potential Good    PT Frequency 2x / week    PT Duration 8 weeks    PT Treatment/Interventions ADLs/Self Care Home Management;Cryotherapy;Gait training;Stair training;Functional mobility training;Therapeutic activities;Therapeutic exercise;Balance training;Neuromuscular re-education;Patient/family education;Manual techniques;Passive range of motion;Scar mobilization;Taping;Vasopneumatic Device    PT Next Visit Plan review/ update HEP (Hx of CX no heat, estim, [place]) , knee ROM, patellar mobs, gait training, general strength, vaso PRN for pain / swelling    PT Home Exercise Plan [id number]    Consulted and Agree with Plan of Care Patient           Patient will benefit from skilled therapeutic intervention in order to improve the following deficits and impairments:  Improper body mechanics,Increased muscle spasms,Decreased strength,Abnormal gait,Pain,Decreased activity tolerance,Decreased endurance,Decreased range of motion,Increased edema  Visit Diagnosis: Total knee replacement status, left  Chronic pain of left knee  Localized edema  Stiffness of left knee, not elsewhere classified  Muscle weakness (generalized)  Other abnormalities of gait and mobility     Problem List Patient Active Problem List    Diagnosis Date Noted  . S/P total knee arthroplasty, left 12/31/2020  . Acquired contracture of bladder neck 07/04/2019  . Hypertriglyceridemia 01/07/2019  . Chest pain 01/07/2019  .  Chronic diastolic CHF (congestive heart failure) ([place]) 01/07/2019  . AKI (acute kidney injury) ([place]) 01/07/2019  . Shock liver 01/07/2019  . 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 Malignant neoplasm of prostate ([place]) 08/10/2016  . 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 Essential hypertension 11/26/2015  . 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 High anion gap metabolic acidosis 09/64/3838  . Substance abuse in remission [facility]) 11/26/2015   Starr Lake PT, DPT, LAT, ATC  01/03/21  10:39 AM      [facility] [address] [place], [place], [address] Phone: [phone]   Fax:  [phone]  Name: Bradley Ray MRN: [id number] Date of Birth: 1956-05-02        Check all possible CPT codes: 62446- Therapeutic Exercise, [phone]- Neuro Re-education, [phone] - Gait Training, [phone] - Manual Therapy, [phone] - Therapeutic Activities, [phone] - [place], [phone] - Vaso and [phone] - Physical performance training

## 2021-01-08 ENCOUNTER — Ambulatory Visit: Payer: Medicaid Other | Admitting: Physical Therapy

## 2021-01-08 ENCOUNTER — Other Ambulatory Visit: Payer: Self-pay

## 2021-01-08 ENCOUNTER — Encounter: Payer: Self-pay | Admitting: Physical Therapy

## 2021-01-08 DIAGNOSIS — G8929 Other chronic pain: Secondary | ICD-10-CM

## 2021-01-08 DIAGNOSIS — M25562 Pain in left knee: Secondary | ICD-10-CM

## 2021-01-08 DIAGNOSIS — M25662 Stiffness of left knee, not elsewhere classified: Secondary | ICD-10-CM

## 2021-01-08 DIAGNOSIS — R600 Localized edema: Secondary | ICD-10-CM

## 2021-01-08 DIAGNOSIS — M6281 Muscle weakness (generalized): Secondary | ICD-10-CM

## 2021-01-08 DIAGNOSIS — R2689 Other abnormalities of gait and mobility: Secondary | ICD-10-CM

## 2021-01-08 DIAGNOSIS — Z96652 Presence of left artificial knee joint: Secondary | ICD-10-CM

## 2021-01-08 NOTE — Therapy (Signed)
[place], [place], [address] Phone: [phone]   Fax:  [phone]  Physical Therapy Treatment  Patient Details  Name: Bradley Ray MRN: [id number] Date of Birth: December 13, 1955 Referring Provider (PT): [name], MD   Encounter Date: 01/08/2021   PT End of Session - 01/08/21 1011    Visit Number 2    Number of Visits 17    Date for PT Re-Evaluation 02/28/21    Authorization Type UHC managed MCD    PT Start Time 1011    PT Stop Time 1058    PT Time Calculation (min) 47 min    Activity Tolerance Patient tolerated treatment well;Patient limited by pain    Behavior During Therapy [facility] for tasks assessed/performed           Past Medical History:  Diagnosis Date  . Alcoholism ([place])    per epic documentation long hx dependence--- (06-30-2019  per pt was drinking average 3 shots dialy until 03/ 2020, since then one beer weekly)  . Arthritis    knees  . Asthma   . Beta thalassemia trait    per hematologist/ oncologist note- dr Dorita Sciara--  per blood work-up  dx 02/ 2017  . Bladder neck contracture   . BPH with obstruction/lower urinary tract symptoms   . Chronic kidney disease    Hx: of Hemodyalysis  . Cocaine abuse, in remission ([place])    06-30-2019   per pt states last used 12/ 2017  . COVID 11/09/2019   loss of taste and smell and appetite x 14 days all symptoms resolved  . ED (erectile dysfunction)   . Hepatitis    hepatitis a age 27  . History of acute renal failure 12/2018   kidneys fully recovered no nephrologist per pt  . History of chest pain    01-06-2019 hospital admission-- dx acute renal failure, hypotensive with associated shock liver, SIRS with tachycardia/ tachyapnea,  chf diastolic  ;   [phone]  per pt no chest pain since and everything resolved , followed by pcp  . Hyperlipidemia   . Hypertension   . Prostate cancer [facility]) urologist-  dr wrenn/  oncologist-  dr Tammi Klippel   dx  07-02-2016,  T1c,  Gleason 3+3,  PSA 6.48,  48.44cc  . 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 Wears dentures    upper    Past Surgical History:  Procedure Laterality Date  . CYSTOSCOPY N/A 10/15/2016   Procedure: CYSTOSCOPY FLEXIBLE;  Surgeon: [name], MD;  [facility];  Service: Urology;  Laterality: N/A;  NO SEEDS FOUND IN BLADDER  . CYSTOSCOPY N/A 08/01/2020   Procedure: CYSTOSCOPY/removal of bladder stone and bladder neck biopsy;  Surgeon: [name], MD;  Location: [facility];  Service: Urology;  Laterality: N/A;  . IR FLUORO GUIDE CV LINE RIGHT  01/09/2019  . IR US GUIDE VASC ACCESS RIGHT  01/09/2019  . ORIF LEFT ANKLE  1980's  . PROSTATE BIOPSY    . RADIOACTIVE SEED IMPLANT N/A 10/15/2016   Procedure: RADIOACTIVE SEED IMPLANT/BRACHYTHERAPY IMPLANT;  Surgeon: [name], MD;  [facility];  Service: Urology;  Laterality: N/A;     65    SEEDS IMPLANTED  . REPAIR EXTENSOR TENDON Right 06/02/2016  Procedure: RIGHT LONG FINGER EXTENSOR TENDON REPAIR;  Surgeon: Milly Jakob, MD;  Location: [place];  Service: Orthopedics;  Laterality: Right;  . TOTAL KNEE ARTHROPLASTY Right 2010  approx.  Marland Kitchen TOTAL KNEE ARTHROPLASTY Left 12/31/2020   Procedure: TOTAL KNEE ARTHROPLASTY;  Surgeon: [name], MD;  [facility];  Service: Orthopedics;  Laterality: Left;  . TRANSURETHRAL INCISION OF BLADDER NECK N/A 07/04/2019   Procedure: CYSTOSCOPY WITH INCISION OF BLADDER NECK CONTRACTURE, BALLOON DILITATION;  Surgeon: [name], MD;  Location: [facility];  Service: Urology;  Laterality: N/A;  . TRANSURETHRAL INCISION OF BLADDER NECK N/A 08/01/2020   Procedure: INCISION OF BLADDER NECK;  Surgeon: [name], MD;  Location: [facility];  Service: Urology;  Laterality: N/A;  1 HR  . TRANSURETHRAL RESECTION OF PROSTATE N/A 06/16/2018   Procedure: TRANSURETHRAL RESECTION OF THE PROSTATE (TURP);  Surgeon: [name], MD;  [facility];  Service: Urology;  Laterality: N/A;    There were no vitals filed for this visit.   Subjective Assessment - 01/08/21 1013    Subjective "I am doing pretty good today. I've been doing my exerises at home and feel like I am able to lift it better without using my leg lifter as much."    Currently in Pain? Yes    Pain Score 0-No pain              [facility] PT Assessment - 01/08/21 0001      Assessment   Medical Diagnosis L TKA    Referring Provider (PT) [name], MD      AROM   Left Knee Extension 13    Left Knee Flexion 79   following manual 87                        [facility] Adult PT Treatment/Exercise - 01/08/21 0001      Neuro Re-ed    Neuro Re-ed Details  weight shifting forward/ backward with LLE forward and active ankle DF with backward shifting to involuntary quad activation      Exercises   Exercises Knee/Hip      Knee/Hip Exercises: Stretches   Active Hamstring Stretch 2 reps;30 seconds;Left      Knee/Hip Exercises: Aerobic   Recumbent Bike x 6 min 1/2 revolutions      Knee/Hip Exercises: Supine   Quad Sets 1 set;10 reps   with towel in popliteal space with 5 sec hold   Heel Slides 1 set;10 reps;Left;AAROM      Modalities   Modalities Vasopneumatic      Vasopneumatic   Number Minutes Vasopneumatic  10 minutes    Vasopnuematic Location  Knee    Vasopneumatic Pressure Medium    Vasopneumatic Temperature  34      Manual Therapy   Manual Therapy Joint mobilization    Joint Mobilization grade III PA/AP tibiofemoral joint                    PT Short Term Goals - 01/03/21 1030      PT SHORT TERM GOAL #1   Title pt to be IND with inital HEP    Baseline no previous HEP    Time 4    Period Weeks    Status New     Target Date 01/31/21      PT SHORT TERM GOAL #2   Title increase knee AROM total arc to >/=  10 - 90 degrees    Baseline see flowsheet    Time 4    Period Weeks    Status New    Target Date 02/14/21      PT SHORT TERM GOAL #3   Title reduce max pain to </= 4/10 for functional and therapeutic progression    Baseline max pain 8/10    Time 4    Period Weeks    Status New    Target Date 01/31/21             PT Long Term Goals - 01/03/21 1032      PT LONG TERM GOAL #1   Title increase L knee total arc ROM to >/= 5 - 120 for functional ROM required for efficient gait and general mobility    Baseline see flow sheet    Time 8    Period Weeks    Status New    Target Date 02/28/21      PT LONG TERM GOAL #2   Title increase gross LLE strength to >/= 4+/5 to promote stability and safety with walking/ standing    Baseline unable to assess strength due to pain    Time 8    Period Weeks    Status New    Target Date 02/28/21      PT LONG TERM GOAL #3   Title pt to be abel to sit, stand and walk for >/= 45 min with LRAD for in home and short community distances with max pain of </= 2/10    Baseline sit 20 min, stand 5 min , walk 20 min    Time 8    Period Weeks    Status New    Target Date 02/28/21      PT LONG TERM GOAL #4   Title improve LEFS score to >/= 50/ 80 to demo improvement in function    Baseline inital score 12/80    Time 8    Status New    Target Date 02/28/21      PT LONG TERM GOAL #5   Title pt to be IND with all HEP given and is able to maintain and progress current ROM    Baseline no previous HEP    Time 8    Period Weeks    Status New    Target Date 02/28/21                 Plan - 01/08/21 1048    Clinical Impression Statement Bradley Ray returns for his follow up visit noting compliance with his HEP and reports he has been walking around alot. He demonstrates improved in knee ROM which following manual increased to 87 degrees of flexion. continued  working quad activation which he is doing well with. continue Vaso end of session.    PT Treatment/Interventions ADLs/Self Care Home Management;Cryotherapy;Gait training;Stair training;Functional mobility training;Therapeutic activities;Therapeutic exercise;Balance training;Neuromuscular re-education;Patient/family education;Manual techniques;Passive range of motion;Scar mobilization;Taping;Vasopneumatic Device    PT Next Visit Plan review/ update HEP (Hx of CX no heat, estim, [place]) , knee ROM, patellar mobs, gait training, general strength, vaso PRN for pain / swelling    Consulted and Agree with Plan of Care Patient           Patient will benefit from skilled therapeutic intervention in order to improve the following deficits and impairments:  Improper body mechanics,Increased muscle spasms,Decreased strength,Abnormal gait,Pain,Decreased activity tolerance,Decreased endurance,Decreased range of motion,Increased edema  Visit Diagnosis: Chronic pain of left knee  Localized edema  Stiffness of left knee, not elsewhere classified  Muscle weakness (generalized)  Other abnormalities of gait and mobility  Total knee replacement status, left     Problem List Patient Active Problem List   Diagnosis Date Noted  . S/P total knee arthroplasty, left 12/31/2020  . Acquired contracture of bladder neck 07/04/2019  . Hypertriglyceridemia 01/07/2019  . Chest pain 01/07/2019  . Chronic diastolic CHF (congestive heart failure) ([place]) 01/07/2019  . AKI (acute kidney injury) ([place]) 01/07/2019  . Shock liver 01/07/2019  . 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 Malignant neoplasm of prostate ([place]) 08/10/2016  . 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 Essential hypertension 11/26/2015  . 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 High anion gap metabolic acidosis 35/45/6256  . Substance abuse in remission [facility]) 11/26/2015    Starr Lake PT, DPT, LAT, ATC  01/08/21  10:53 AM      [facility] [address] [place], [place], [address] Phone: [phone]   Fax:  [phone]  Name: Bradley Ray MRN: [id number] Date of Birth: 04/06/1956

## 2021-01-10 ENCOUNTER — Other Ambulatory Visit: Payer: Self-pay

## 2021-01-10 ENCOUNTER — Ambulatory Visit: Payer: Medicaid Other | Attending: Orthopedic Surgery | Admitting: Physical Therapy

## 2021-01-10 DIAGNOSIS — G8929 Other chronic pain: Secondary | ICD-10-CM

## 2021-01-10 DIAGNOSIS — M25562 Pain in left knee: Secondary | ICD-10-CM | POA: Diagnosis not present

## 2021-01-10 DIAGNOSIS — M25662 Stiffness of left knee, not elsewhere classified: Secondary | ICD-10-CM | POA: Insufficient documentation

## 2021-01-10 DIAGNOSIS — R600 Localized edema: Secondary | ICD-10-CM | POA: Diagnosis present

## 2021-01-10 DIAGNOSIS — M6281 Muscle weakness (generalized): Secondary | ICD-10-CM | POA: Diagnosis present

## 2021-01-10 DIAGNOSIS — R2689 Other abnormalities of gait and mobility: Secondary | ICD-10-CM | POA: Diagnosis present

## 2021-01-10 DIAGNOSIS — Z96652 Presence of left artificial knee joint: Secondary | ICD-10-CM | POA: Insufficient documentation

## 2021-01-10 NOTE — Therapy (Signed)
[place], [place], [address] Phone: [phone]   Fax:  [phone]  Physical Therapy Treatment  Patient Details  Name: Bradley Ray MRN: [id number] Date of Birth: [birth year] Referring Provider (PT): [name], MD   Encounter Date: 01/10/2021   PT End of Session - 01/10/21 0959    Visit Number 3    Number of Visits 17    Date for PT Re-Evaluation 02/28/21    Authorization Type UHC managed MCD    Authorization - Visit Number 2    Authorization - Number of Visits 27    PT Start Time 0930    PT Stop Time 1018    PT Time Calculation (min) 48 min    Activity Tolerance Patient tolerated treatment well;Patient limited by pain    Behavior During Therapy [facility] for tasks assessed/performed           Past Medical History:  Diagnosis Date  . Alcoholism ([place])    per epic documentation long hx dependence--- (06-30-2019  per pt was drinking average 3 shots dialy until 03/ 2020, since then one beer weekly)  . Arthritis    knees  . Asthma   . Beta thalassemia trait    per hematologist/ oncologist note- dr Dorita Sciara--  per blood work-up  dx 02/ 2017  . Bladder neck contracture   . BPH with obstruction/lower urinary tract symptoms   . Chronic kidney disease    Hx: of Hemodyalysis  . Cocaine abuse, in remission ([place])    06-30-2019   per pt states last used 12/ 2017  . COVID 11/09/2019   loss of taste and smell and appetite x 14 days all symptoms resolved  . ED (erectile dysfunction)   . Hepatitis    hepatitis a age 36  . History of acute renal failure 12/2018   kidneys fully recovered no nephrologist per pt  . History of chest pain    01-06-2019 hospital admission-- dx acute renal failure, hypotensive with associated shock liver, SIRS with tachycardia/ tachyapnea,  chf diastolic  ;   [phone]  per pt no chest pain since and everything resolved , followed by pcp  . Hyperlipidemia   . Hypertension   .  Prostate cancer [facility]) urologist-  dr wrenn/  oncologist-  dr Tammi Klippel   dx 07-02-2016,  T1c,  Gleason 3+3,  PSA 6.48,  48.44cc  . 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 Wears dentures    upper    Past Surgical History:  Procedure Laterality Date  . CYSTOSCOPY N/A 10/15/2016   Procedure: CYSTOSCOPY FLEXIBLE;  Surgeon: [name], MD;  [facility];  Service: Urology;  Laterality: N/A;  NO SEEDS FOUND IN BLADDER  . CYSTOSCOPY N/A 08/01/2020   Procedure: CYSTOSCOPY/removal of bladder stone and bladder neck biopsy;  Surgeon: [name], MD;  Location: [facility];  Service: Urology;  Laterality: N/A;  . IR FLUORO GUIDE CV LINE RIGHT  01/09/2019  . IR US GUIDE VASC ACCESS RIGHT  01/09/2019  . ORIF LEFT ANKLE  1980's  . PROSTATE BIOPSY    . RADIOACTIVE SEED IMPLANT N/A 10/15/2016   Procedure: RADIOACTIVE SEED IMPLANT/BRACHYTHERAPY IMPLANT;  Surgeon: [name], MD;  [facility];  Service: Urology;  Laterality: N/A;  [place]  . REPAIR EXTENSOR TENDON Right 06/02/2016   Procedure: RIGHT LONG FINGER EXTENSOR TENDON REPAIR;  Surgeon: Milly Jakob, MD;  Location: [place];  Service: Orthopedics;  Laterality: Right;  . TOTAL KNEE ARTHROPLASTY Right 2010  approx.  Marland Kitchen TOTAL KNEE ARTHROPLASTY Left 12/31/2020   Procedure: TOTAL KNEE ARTHROPLASTY;  Surgeon: [name], MD;  [facility];  Service: Orthopedics;  Laterality: Left;  . TRANSURETHRAL INCISION OF BLADDER NECK N/A 07/04/2019   Procedure: CYSTOSCOPY WITH INCISION OF BLADDER NECK CONTRACTURE, BALLOON DILITATION;  Surgeon: [name], MD;  Location: [facility];  Service: Urology;  Laterality: N/A;  . TRANSURETHRAL INCISION OF BLADDER NECK N/A 08/01/2020   Procedure: INCISION OF  BLADDER NECK;  Surgeon: [name], MD;  Location: [facility];  Service: Urology;  Laterality: N/A;  1 HR  . TRANSURETHRAL RESECTION OF PROSTATE N/A 06/16/2018   Procedure: TRANSURETHRAL RESECTION OF THE PROSTATE (TURP);  Surgeon: [name], MD;  [facility];  Service: Urology;  Laterality: N/A;    There were no vitals filed for this visit.   Subjective Assessment - 01/10/21 0935    Subjective " I had more trouble with sleeping last night, I am feeling alittle more soreness this morning rated at 8/10. I did the machine before coming in today.    Patient Stated Goals get back to mowing the lawn, get back to hitting the dance floor.    Currently in Pain? Yes    Pain Score 8     Pain Orientation Left    Pain Descriptors / Indicators Aching    Pain Onset More than a month ago    Pain Frequency Intermittent    Aggravating Factors  prolonged sitting/ standing    Pain Relieving Factors exercise              [facility] PT Assessment - 01/10/21 0001      Assessment   Medical Diagnosis L TKA    Referring Provider (PT) [name], MD      AROM   Left Knee Extension 15    Left Knee Flexion 90   95 following manual techniques                        [facility] Adult PT Treatment/Exercise - 01/10/21 0001      Knee/Hip Exercises: Aerobic   Recumbent Bike x 6 min full revolutions going forward/ backward.      Knee/Hip Exercises: Seated   Sit to Sand 1 set;10 reps      Knee/Hip Exercises: Supine   Quad Sets 1 set;10 reps   3 sec   Short Arc Quad Sets Strengthening;Left;1 set;10 reps   pt opted to hold for 5 sec   Heel Slides 1 set;10 reps;Left;AAROM   with strap     Knee/Hip Exercises: Sidelying   Hip ABduction 2 sets;10 reps;Left      Vasopneumatic   Number Minutes Vasopneumatic  10 minutes    Vasopnuematic Location  Knee    Vasopneumatic Pressure Medium    Vasopneumatic Temperature  34      Manual Therapy   Joint Mobilization grade III PA/AP  tibiofemoral joint                    PT Short Term Goals - 01/03/21 1030      PT SHORT TERM GOAL #1   Title pt to be  IND with inital HEP    Baseline no previous HEP    Time 4    Period Weeks    Status New    Target Date 01/31/21      PT SHORT TERM GOAL #2   Title increase knee AROM total arc to >/= 10 - 90 degrees    Baseline see flowsheet    Time 4    Period Weeks    Status New    Target Date 02/14/21      PT SHORT TERM GOAL #3   Title reduce max pain to </= 4/10 for functional and therapeutic progression    Baseline max pain 8/10    Time 4    Period Weeks    Status New    Target Date 01/31/21             PT Long Term Goals - 01/03/21 1032      PT LONG TERM GOAL #1   Title increase L knee total arc ROM to >/= 5 - 120 for functional ROM required for efficient gait and general mobility    Baseline see flow sheet    Time 8    Period Weeks    Status New    Target Date 02/28/21      PT LONG TERM GOAL #2   Title increase gross LLE strength to >/= 4+/5 to promote stability and safety with walking/ standing    Baseline unable to assess strength due to pain    Time 8    Period Weeks    Status New    Target Date 02/28/21      PT LONG TERM GOAL #3   Title pt to be abel to sit, stand and walk for >/= 45 min with LRAD for in home and short community distances with max pain of </= 2/10    Baseline sit 20 min, stand 5 min , walk 20 min    Time 8    Period Weeks    Status New    Target Date 02/28/21      PT LONG TERM GOAL #4   Title improve LEFS score to >/= 50/ 80 to demo improvement in function    Baseline inital score 12/80    Time 8    Status New    Target Date 02/28/21      PT LONG TERM GOAL #5   Title pt to be IND with all HEP given and is able to maintain and progress current ROM    Baseline no previous HEP    Time 8    Period Weeks    Status New    Target Date 02/28/21                 Plan - 01/10/21 1005    Clinical Impression  Statement Bradley Ray is making good progress with physical therapy increasing his knee ROM arc 15 - 90 degrees today. He continues to use his CPM at home daily and reports continued soreness which is expected. continued focusing on ROM and quad activation which he continues to demonstate limited quad strength secondary to pain and swelling. continued vaso end of session.    PT Treatment/Interventions ADLs/Self Care Home Management;Cryotherapy;Gait training;Stair training;Functional mobility training;Therapeutic activities;Therapeutic exercise;Balance training;Neuromuscular re-education;Patient/family education;Manual techniques;Passive range of motion;Scar mobilization;Taping;Vasopneumatic Device    PT Next Visit Plan review/ update HEP (Hx of CX no heat, estim, [place]) , knee ROM, patellar mobs, gait training, general strength, vaso PRN for pain / swelling  Consulted and Agree with Plan of Care Patient           Patient will benefit from skilled therapeutic intervention in order to improve the following deficits and impairments:  Improper body mechanics,Increased muscle spasms,Decreased strength,Abnormal gait,Pain,Decreased activity tolerance,Decreased endurance,Decreased range of motion,Increased edema  Visit Diagnosis: Chronic pain of left knee  Localized edema  Stiffness of left knee, not elsewhere classified     Problem List Patient Active Problem List   Diagnosis Date Noted  . S/P total knee arthroplasty, left 12/31/2020  . Acquired contracture of bladder neck 07/04/2019  . Hypertriglyceridemia 01/07/2019  . Chest pain 01/07/2019  . Chronic diastolic CHF (congestive heart failure) ([place]) 01/07/2019  . AKI (acute kidney injury) ([place]) 01/07/2019  . Shock liver 01/07/2019  . 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 Malignant neoplasm of prostate ([place]) 08/10/2016  . 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 Essential hypertension 11/26/2015  . 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 High anion gap  metabolic acidosis 12/87/8676  . Substance abuse in remission [facility]) 11/26/2015   Starr Lake PT, DPT, LAT, ATC  01/10/21  10:10 AM      [facility] [address] [place], [place], [address] Phone: [phone]   Fax:  [phone]  Name: Bradley Ray MRN: [id number] Date of Birth: [birth year]

## 2021-01-15 ENCOUNTER — Other Ambulatory Visit: Payer: Self-pay

## 2021-01-15 ENCOUNTER — Ambulatory Visit: Payer: Medicaid Other | Admitting: Physical Therapy

## 2021-01-15 ENCOUNTER — Encounter: Payer: Self-pay | Admitting: Physical Therapy

## 2021-01-15 DIAGNOSIS — M6281 Muscle weakness (generalized): Secondary | ICD-10-CM

## 2021-01-15 DIAGNOSIS — M25662 Stiffness of left knee, not elsewhere classified: Secondary | ICD-10-CM

## 2021-01-15 DIAGNOSIS — M25562 Pain in left knee: Secondary | ICD-10-CM | POA: Diagnosis not present

## 2021-01-15 DIAGNOSIS — R600 Localized edema: Secondary | ICD-10-CM

## 2021-01-15 DIAGNOSIS — G8929 Other chronic pain: Secondary | ICD-10-CM

## 2021-01-15 NOTE — Therapy (Signed)
[place], [place], [address] Phone: [phone]   Fax:  [phone]  Physical Therapy Treatment  Patient Details  Name: Bradley Ray MRN: [id number] Date of Birth: 04-16-1956 Referring Provider (PT): [name], MD   Encounter Date: 01/15/2021   PT End of Session - 01/15/21 0937    Visit Number 4    Number of Visits 17    Date for PT Re-Evaluation 02/28/21    Authorization Type [employer] managed MCD    Authorization - Visit Number 3    Authorization - Number of Visits 27    PT Start Time 0931    PT Stop Time 1010    PT Time Calculation (min) 39 min           Past Medical History:  Diagnosis Date  . Alcoholism ([place])    per epic documentation long hx dependence--- (06-30-2019  per pt was drinking average 3 shots dialy until 03/ 2020, since then one beer weekly)  . Arthritis    knees  . Asthma   . Beta thalassemia trait    per hematologist/ oncologist note- dr Dorita Sciara--  per blood work-up  dx 02/ 2017  . Bladder neck contracture   . BPH with obstruction/lower urinary tract symptoms   . Chronic kidney disease    Hx: of Hemodyalysis  . Cocaine abuse, in remission ([place])    06-30-2019   per pt states last used 12/ 2017  . COVID 11/09/2019   loss of taste and smell and appetite x 14 days all symptoms resolved  . ED (erectile dysfunction)   . Hepatitis    hepatitis a age 65  . History of acute renal failure 12/2018   kidneys fully recovered no nephrologist per pt  . History of chest pain    01-06-2019 hospital admission-- dx acute renal failure, hypotensive with associated shock liver, SIRS with tachycardia/ tachyapnea,  chf diastolic  ;   [phone]  per pt no chest pain since and everything resolved , followed by pcp  . Hyperlipidemia   . Hypertension   . Prostate cancer [facility]) urologist-  dr wrenn/  oncologist-  dr Tammi Klippel   dx 07-02-2016,  T1c,  Gleason 3+3,  PSA 6.48,  48.44cc  . Splenic  infarct currently followed by pcp (previously dr Alvy Bimler [facility])   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  anticoagulated w/ xarelto until june 2017 changed to asa 162mg  daily/   (06-30-2019 per pt now taking ASA 81mg  )  . Wears dentures    upper    Past Surgical History:  Procedure Laterality Date  . CYSTOSCOPY N/A 10/15/2016   Procedure: CYSTOSCOPY FLEXIBLE;  Surgeon: [name], MD;  [facility]: [facility];  Service: Urology;  Laterality: N/A;  NO SEEDS FOUND IN BLADDER  . CYSTOSCOPY N/A 08/01/2020   Procedure: CYSTOSCOPY/removal of bladder stone and bladder neck biopsy;  Surgeon: [name], MD;  Location: [facility];  Service: Urology;  Laterality: N/A;  . IR FLUORO GUIDE CV LINE RIGHT  01/09/2019  . IR US GUIDE VASC ACCESS RIGHT  01/09/2019  . ORIF LEFT ANKLE  1980's  . PROSTATE BIOPSY    . RADIOACTIVE SEED IMPLANT N/A 10/15/2016   Procedure: RADIOACTIVE SEED IMPLANT/BRACHYTHERAPY IMPLANT;  Surgeon: [name], MD;  [facility];  Service: Urology;  Laterality: N/A;     66    SEEDS IMPLANTED  . REPAIR EXTENSOR TENDON Right 06/02/2016   Procedure: RIGHT LONG FINGER  EXTENSOR TENDON REPAIR;  Surgeon: Milly Jakob, MD;  Location: [facility] [place];  Service: Orthopedics;  Laterality: Right;  . TOTAL KNEE ARTHROPLASTY Right 2010  approx.  Marland Kitchen TOTAL KNEE ARTHROPLASTY Left 12/31/2020   Procedure: TOTAL KNEE ARTHROPLASTY;  Surgeon: [name], MD;  [facility];  Service: Orthopedics;  Laterality: Left;  . TRANSURETHRAL INCISION OF BLADDER NECK N/A 07/04/2019   Procedure: CYSTOSCOPY WITH INCISION OF BLADDER NECK CONTRACTURE, BALLOON DILITATION;  Surgeon: [name], MD;  Location: [facility];  Service: Urology;  Laterality: N/A;  . TRANSURETHRAL INCISION OF BLADDER NECK N/A 08/01/2020   Procedure: INCISION OF BLADDER NECK;  Surgeon: [name], MD;  Location: [facility];  Service: Urology;  Laterality: N/A;  1 HR  . TRANSURETHRAL  RESECTION OF PROSTATE N/A 06/16/2018   Procedure: TRANSURETHRAL RESECTION OF THE PROSTATE (TURP);  Surgeon: [name], MD;  [facility];  Service: Urology;  Laterality: N/A;    There were no vitals filed for this visit.   Subjective Assessment - 01/15/21 0935    Subjective " I saw the MD and he was pleased with the motion. He did say I had a lot of swelling on the knee and gave me a sleeve, he stated he couldn't draw it off. I am feeling more stiff today."    Currently in Pain? Yes    Pain Location Knee    Pain Orientation Left              [facility] PT Assessment - 01/15/21 0001      AROM   Left Knee Extension 13    Left Knee Flexion 92                         [facility] Adult PT Treatment/Exercise - 01/15/21 0001      Knee/Hip Exercises: Stretches   Active Hamstring Stretch 2 reps;Left;30 seconds    Quad Stretch Left;2 reps;30 seconds   in thomas test pos     Knee/Hip Exercises: Aerobic   Recumbent Bike x 6 min forward   full revolutions     Knee/Hip Exercises: Standing   Terminal Knee Extension Strengthening;Right;2 sets;10 reps   with green band in popliteal space   Gait Training using SPC in RUE to support LLE 4 x 80 ft   cues for proper form and scanning environment vs looking down     Knee/Hip Exercises: Supine   Quad Sets Strengthening;1 set;10 reps   5 sec hold   Short Arc Quad Sets --    Bridges Both;2 sets;10 reps;Strengthening    Straight Leg Raises Strengthening;1 set;10 reps   quad lag noted                   PT Short Term Goals - 01/03/21 1030      PT SHORT TERM GOAL #1   Title pt to be IND with inital HEP    Baseline no previous HEP    Time 4    Period Weeks    Status New    Target Date 01/31/21      PT SHORT TERM GOAL #2   Title increase knee AROM total arc to >/= 10 - 90 degrees    Baseline see flowsheet    Time 4    Period Weeks    Status New    Target Date 02/14/21      PT SHORT TERM GOAL #3   Title  reduce max  pain to </= 4/10 for functional and therapeutic progression    Baseline max pain 8/10    Time 4    Period Weeks    Status New    Target Date 01/31/21             PT Long Term Goals - 01/03/21 1032      PT LONG TERM GOAL #1   Title increase L knee total arc ROM to >/= 5 - 120 for functional ROM required for efficient gait and general mobility    Baseline see flow sheet    Time 8    Period Weeks    Status New    Target Date 02/28/21      PT LONG TERM GOAL #2   Title increase gross LLE strength to >/= 4+/5 to promote stability and safety with walking/ standing    Baseline unable to assess strength due to pain    Time 8    Period Weeks    Status New    Target Date 02/28/21      PT LONG TERM GOAL #3   Title pt to be abel to sit, stand and walk for >/= 45 min with LRAD for in home and short community distances with max pain of </= 2/10    Baseline sit 20 min, stand 5 min , walk 20 min    Time 8    Period Weeks    Status New    Target Date 02/28/21      PT LONG TERM GOAL #4   Title improve LEFS score to >/= 50/ 80 to demo improvement in function    Baseline inital score 12/80    Time 8    Status New    Target Date 02/28/21      PT LONG TERM GOAL #5   Title pt to be IND with all HEP given and is able to maintain and progress current ROM    Baseline no previous HEP    Time 8    Period Weeks    Status New    Target Date 02/28/21                 Plan - 01/15/21 1011    Clinical Impression Statement The MD visit went well, he said "I needed to stop using the CPM". pt is making progressing with knee ROM and additionally reports no pain today. Continued working on hip/ knee strengthening which he is progressing with as well but does continue to fatigue quickly. started working on gait training using [place] which he  did well with. end of session he noted feeling good and declined modalities.    PT Treatment/Interventions ADLs/Self Care Home Management;Cryotherapy;Gait  training;Stair training;Functional mobility training;Therapeutic activities;Therapeutic exercise;Balance training;Neuromuscular re-education;Patient/family education;Manual techniques;Passive range of motion;Scar mobilization;Taping;Vasopneumatic Device    PT Next Visit Plan review/ update HEP (Hx of CX no heat, estim, [place]) , knee ROM, patellar mobs, gait training, general strength, vaso PRN for pain / swelling    Consulted and Agree with Plan of Care Patient           Patient will benefit from skilled therapeutic intervention in order to improve the following deficits and impairments:  Improper body mechanics,Increased muscle spasms,Decreased strength,Abnormal gait,Pain,Decreased activity tolerance,Decreased endurance,Decreased range of motion,Increased edema  Visit Diagnosis: Chronic pain of left knee  Localized edema  Stiffness of left knee, not elsewhere classified  Muscle weakness (generalized)     Problem List Patient Active Problem List  Diagnosis Date Noted  . S/P total knee arthroplasty, left 12/31/2020  . Acquired contracture of bladder neck 07/04/2019  . Hypertriglyceridemia 01/07/2019  . Chest pain 01/07/2019  . Chronic diastolic CHF (congestive heart failure) ([place]) 01/07/2019  . AKI (acute kidney injury) ([place]) 01/07/2019  . Shock liver 01/07/2019  . 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 Malignant neoplasm of prostate ([place]) 08/10/2016  . 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 Essential hypertension 11/26/2015  . 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 High anion gap metabolic acidosis 78/58/8502  . Substance abuse in remission [facility]) 11/26/2015   Starr Lake PT, DPT, LAT, ATC  01/15/21  10:15 AM      [facility] [address] [place], [place], [address] Phone: [phone]   Fax:  [phone]  Name: ABHIMANYU CRUCES MRN: [id number] Date of Birth: 1956-09-12

## 2021-01-17 ENCOUNTER — Encounter: Payer: Self-pay | Admitting: Physical Therapy

## 2021-01-17 ENCOUNTER — Other Ambulatory Visit: Payer: Self-pay

## 2021-01-17 ENCOUNTER — Ambulatory Visit: Payer: Medicaid Other | Admitting: Physical Therapy

## 2021-01-17 DIAGNOSIS — M25562 Pain in left knee: Secondary | ICD-10-CM

## 2021-01-17 DIAGNOSIS — M25662 Stiffness of left knee, not elsewhere classified: Secondary | ICD-10-CM

## 2021-01-17 DIAGNOSIS — R600 Localized edema: Secondary | ICD-10-CM

## 2021-01-17 DIAGNOSIS — M6281 Muscle weakness (generalized): Secondary | ICD-10-CM

## 2021-01-17 DIAGNOSIS — G8929 Other chronic pain: Secondary | ICD-10-CM

## 2021-01-17 DIAGNOSIS — R2689 Other abnormalities of gait and mobility: Secondary | ICD-10-CM

## 2021-01-17 NOTE — Therapy (Signed)
[place], [place], [address] Phone: [phone]   Fax:  [phone]  Physical Therapy Treatment  Patient Details  Name: Bradley Ray MRN: [id number] Date of Birth: 07/05/1956 Referring Provider (PT): [name], MD   Encounter Date: 01/17/2021   PT End of Session - 01/17/21 1025    Visit Number 5    Number of Visits 17    Date for PT Re-Evaluation 02/28/21    Authorization Type UHC managed MCD    Authorization - Visit Number 4    Authorization - Number of Visits 27    PT Start Time 1016    PT Stop Time 1110    PT Time Calculation (min) 54 min           Past Medical History:  Diagnosis Date  . Alcoholism ([place])    per epic documentation long hx dependence--- (06-30-2019  per pt was drinking average 3 shots dialy until 03/ 2020, since then one beer weekly)  . Arthritis    knees  . Asthma   . Beta thalassemia trait    per hematologist/ oncologist note- dr Dorita Sciara--  per blood work-up  dx 02/ 2017  . Bladder neck contracture   . BPH with obstruction/lower urinary tract symptoms   . Chronic kidney disease    Hx: of Hemodyalysis  . Cocaine abuse, in remission ([place])    06-30-2019   per pt states last used 12/ 2017  . COVID 11/09/2019   loss of taste and smell and appetite x 14 days all symptoms resolved  . ED (erectile dysfunction)   . Hepatitis    hepatitis a age 15  . History of acute renal failure 12/2018   kidneys fully recovered no nephrologist per pt  . History of chest pain    01-06-2019 hospital admission-- dx acute renal failure, hypotensive with associated shock liver, SIRS with tachycardia/ tachyapnea,  chf diastolic  ;   [id number]  per pt no chest pain since and everything resolved , followed by pcp  . Hyperlipidemia   . Hypertension   . Prostate cancer [facility]) urologist-  dr wrenn/  oncologist-  dr Tammi Klippel   dx 07-02-2016,  T1c,  Gleason 3+3,  PSA 6.48,  48.44cc  . Splenic  infarct currently followed by pcp (previously dr Alvy Bimler -[facility])   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  anticoagulated w/ xarelto until june 2017 changed to asa 162mg  daily/   (06-30-2019 per pt now taking ASA 81mg  )  . Wears dentures    upper    Past Surgical History:  Procedure Laterality Date  . CYSTOSCOPY N/A 10/15/2016   Procedure: CYSTOSCOPY FLEXIBLE;  Surgeon: [name], MD;  [facility];  Service: Urology;  Laterality: N/A;  NO SEEDS FOUND IN BLADDER  . CYSTOSCOPY N/A 08/01/2020   Procedure: CYSTOSCOPY/removal of bladder stone and bladder neck biopsy;  Surgeon: [name], MD;  Location: [facility];  Service: Urology;  Laterality: N/A;  . IR FLUORO GUIDE CV LINE RIGHT  01/09/2019  . IR US GUIDE VASC ACCESS RIGHT  01/09/2019  . ORIF LEFT ANKLE  1980's  . PROSTATE BIOPSY    . RADIOACTIVE SEED IMPLANT N/A 10/15/2016   Procedure: RADIOACTIVE SEED IMPLANT/BRACHYTHERAPY IMPLANT;  Surgeon: [name], MD;  [facility];  Service: Urology;  Laterality: N/A;     66    SEEDS IMPLANTED  . REPAIR EXTENSOR TENDON Right 06/02/2016   Procedure: RIGHT LONG FINGER  EXTENSOR TENDON REPAIR;  Surgeon: Milly Jakob, MD;  Location: [place];  Service: Orthopedics;  Laterality: Right;  . TOTAL KNEE ARTHROPLASTY Right 2010  approx.  Marland Kitchen TOTAL KNEE ARTHROPLASTY Left 12/31/2020   Procedure: TOTAL KNEE ARTHROPLASTY;  Surgeon: [name], MD;  [facility];  Service: Orthopedics;  Laterality: Left;  . TRANSURETHRAL INCISION OF BLADDER NECK N/A 07/04/2019   Procedure: CYSTOSCOPY WITH INCISION OF BLADDER NECK CONTRACTURE, BALLOON DILITATION;  Surgeon: [name], MD;  Location: [facility];  Service: Urology;  Laterality: N/A;  . TRANSURETHRAL INCISION OF BLADDER NECK N/A 08/01/2020   Procedure: INCISION OF BLADDER NECK;  Surgeon: [name], MD;  Location: [facility];  Service: Urology;  Laterality: N/A;  1 HR  . TRANSURETHRAL  RESECTION OF PROSTATE N/A 06/16/2018   Procedure: TRANSURETHRAL RESECTION OF THE PROSTATE (TURP);  Surgeon: [name], MD;  [facility];  Service: Urology;  Laterality: N/A;    There were no vitals filed for this visit.   Subjective Assessment - 01/17/21 1023    Subjective I have been up since 4am trying to get the pain worked out.    Currently in Pain? Yes    Pain Score 5     Pain Location Knee    Pain Orientation Left    Pain Descriptors / Indicators Throbbing    Pain Type Surgical pain    Aggravating Factors  cant get comfortable at night    Pain Relieving Factors walking, exercise              [facility] PT Assessment - 01/17/21 0001      AROM   Left Knee Extension 12   8 at end of session   Left Knee Flexion 78   92 at end of session                        [facility] Adult PT Treatment/Exercise - 01/17/21 0001      Knee/Hip Exercises: Stretches   Active Hamstring Stretch Limitations 3 reps supine with strap      Knee/Hip Exercises: Aerobic   Recumbent Bike x 6 min forward   full revolutions     Knee/Hip Exercises: Standing   Gait Training using SPC in RUE to support LLE 4 x 80 ft   adjusted cane to proper height, good carry over from previous session with gait     Knee/Hip Exercises: Supine   Quad Sets Strengthening;1 set;10 reps   5 sec hold   Quad Sets Limitations towel roll for feedback    Heel Slides AROM;10 reps    Straight Leg Raises Limitations increasde pain in posterior knee to discontinued      Vasopneumatic   Number Minutes Vasopneumatic  10 minutes    Vasopnuematic Location  Knee    Vasopneumatic Pressure Medium    Vasopneumatic Temperature  34      Manual Therapy   Manual therapy comments Passive knee flexion, STW to left hamstring    Joint Mobilization grade III PA/AP tibiofemoral joint, A/P femur mobs , patella mobs all planes                    PT Short Term Goals - 01/03/21 1030      PT SHORT TERM GOAL #1   Title  pt to be IND with inital HEP    Baseline no previous HEP    Time 4    Period  Weeks    Status New    Target Date 01/31/21      PT SHORT TERM GOAL #2   Title increase knee AROM total arc to >/= 10 - 90 degrees    Baseline see flowsheet    Time 4    Period Weeks    Status New    Target Date 02/14/21      PT SHORT TERM GOAL #3   Title reduce max pain to </= 4/10 for functional and therapeutic progression    Baseline max pain 8/10    Time 4    Period Weeks    Status New    Target Date 01/31/21             PT Long Term Goals - 01/03/21 1032      PT LONG TERM GOAL #1   Title increase L knee total arc ROM to >/= 5 - 120 for functional ROM required for efficient gait and general mobility    Baseline see flow sheet    Time 8    Period Weeks    Status New    Target Date 02/28/21      PT LONG TERM GOAL #2   Title increase gross LLE strength to >/= 4+/5 to promote stability and safety with walking/ standing    Baseline unable to assess strength due to pain    Time 8    Period Weeks    Status New    Target Date 02/28/21      PT LONG TERM GOAL #3   Title pt to be abel to sit, stand and walk for >/= 45 min with LRAD for in home and short community distances with max pain of </= 2/10    Baseline sit 20 min, stand 5 min , walk 20 min    Time 8    Period Weeks    Status New    Target Date 02/28/21      PT LONG TERM GOAL #4   Title improve LEFS score to >/= 50/ 80 to demo improvement in function    Baseline inital score 12/80    Time 8    Status New    Target Date 02/28/21      PT LONG TERM GOAL #5   Title pt to be IND with all HEP given and is able to maintain and progress current ROM    Baseline no previous HEP    Time 8    Period Weeks    Status New    Target Date 02/28/21                 Plan - 01/17/21 1159    Clinical Impression Statement Pt arrives reporting increased pain today due to difficulty sleeping last due to knee pain. After warm up on bike  his pain is improved. ADjusted his new cane to appropriate height and he demonstrated good gait mechanics in clinic. His ROM after bike measures 12-78 degrees left knee. Began quad sets and heel slides which both increased his posterior knee pain. He was also unable to perform left SLR without posterior knee pain today and increased quad lag. Worked on stretching his hamstring and performing manual STW to the hamstring. Performed knee flexion and knee extension mobs as well as patella mobs to incrrease ROM. Marland Kitchen He reported improved hamstring pain after manual. His ROM improved to 8-92 AROM. PROM 94 degrees. Began SAQs with good tolerance and ended with Vaso for further pain reduction.  After session he reported feeling much better.    PT Next Visit Plan review/ update HEP (Hx of CX no heat, estim, [place]) , knee ROM, patellar mobs, gait training, general strength, vaso PRN for pain / swelling    PT Home Exercise Plan [id number]           Patient will benefit from skilled therapeutic intervention in order to improve the following deficits and impairments:  Improper body mechanics,Increased muscle spasms,Decreased strength,Abnormal gait,Pain,Decreased activity tolerance,Decreased endurance,Decreased range of motion,Increased edema  Visit Diagnosis: Chronic pain of left knee  Localized edema  Stiffness of left knee, not elsewhere classified  Muscle weakness (generalized)  Other abnormalities of gait and mobility     Problem List Patient Active Problem List   Diagnosis Date Noted  . S/P total knee arthroplasty, left 12/31/2020  . Acquired contracture of bladder neck 07/04/2019  . Hypertriglyceridemia 01/07/2019  . Chest pain 01/07/2019  . Chronic diastolic CHF (congestive heart failure) ([place]) 01/07/2019  . AKI (acute kidney injury) ([place]) 01/07/2019  . Shock liver 01/07/2019  . 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 Malignant neoplasm of prostate ([place]) 08/10/2016  . Splenic infarct  11/26/2015  . Essential hypertension 11/26/2015  . 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 High anion gap metabolic acidosis 72/62/0355  . Substance abuse in remission [facility]) 11/26/2015    Dorene Ar , PTA 01/17/2021, 12:18 PM  [facility] [address] [place], [place], [address] Phone: [phone]   Fax:  [phone]  Name: Bradley Ray MRN: [id number] Date of Birth: 08-20-1956

## 2021-01-20 ENCOUNTER — Other Ambulatory Visit: Payer: Self-pay

## 2021-01-20 ENCOUNTER — Encounter: Payer: Self-pay | Admitting: Physical Therapy

## 2021-01-20 ENCOUNTER — Ambulatory Visit: Payer: Medicaid Other | Admitting: Physical Therapy

## 2021-01-20 DIAGNOSIS — M25562 Pain in left knee: Secondary | ICD-10-CM | POA: Diagnosis not present

## 2021-01-20 DIAGNOSIS — R2689 Other abnormalities of gait and mobility: Secondary | ICD-10-CM

## 2021-01-20 DIAGNOSIS — R600 Localized edema: Secondary | ICD-10-CM

## 2021-01-20 DIAGNOSIS — M25662 Stiffness of left knee, not elsewhere classified: Secondary | ICD-10-CM

## 2021-01-20 DIAGNOSIS — M6281 Muscle weakness (generalized): Secondary | ICD-10-CM

## 2021-01-20 DIAGNOSIS — Z96652 Presence of left artificial knee joint: Secondary | ICD-10-CM

## 2021-01-20 DIAGNOSIS — G8929 Other chronic pain: Secondary | ICD-10-CM

## 2021-01-20 NOTE — Therapy (Signed)
[place], [place], [address] Phone: [phone]   Fax:  [phone]  Physical Therapy Treatment  Patient Details  Name: Bradley Ray MRN: [id number] Date of Birth: [birth year] Referring Provider (PT): [name], MD   Encounter Date: 01/20/2021   PT End of Session - 01/20/21 0928    Visit Number 6    Number of Visits 17    Date for PT Re-Evaluation 02/28/21    Authorization Type U[employer] managed MCD    Authorization - Visit Number 5    Authorization - Number of Visits 27    PT Start Time 0930    PT Stop Time 1019    PT Time Calculation (min) 49 min    Activity Tolerance Patient tolerated treatment well;Patient limited by pain    Behavior During Therapy [facility] for tasks assessed/performed           Past Medical History:  Diagnosis Date  . Alcoholism ([place])    per epic documentation long hx dependence--- (06-30-2019  per pt was drinking average 3 shots dialy until 03/ 2020, since then one beer weekly)  . Arthritis    knees  . Asthma   . Beta thalassemia trait    per hematologist/ oncologist note- dr Dorita Sciara--  per blood work-up  dx 02/ 2017  . Bladder neck contracture   . BPH with obstruction/lower urinary tract symptoms   . Chronic kidney disease    Hx: of Hemodyalysis  . Cocaine abuse, in remission ([place])    06-30-2019   per pt states last used 12/ 2017  . COVID 11/09/2019   loss of taste and smell and appetite x 14 days all symptoms resolved  . ED (erectile dysfunction)   . Hepatitis    hepatitis a age 50  . History of acute renal failure 12/2018   kidneys fully recovered no nephrologist per pt  . History of chest pain    01-06-2019 hospital admission-- dx acute renal failure, hypotensive with associated shock liver, SIRS with tachycardia/ tachyapnea,  chf diastolic  ;   [id number]  per pt no chest pain since and everything resolved , followed by pcp  . Hyperlipidemia   . Hypertension   .  Prostate cancer [facility]) urologist-  dr wrenn/  oncologist-  dr Tammi Klippel   dx 07-02-2016,  T1c,  Gleason 3+3,  PSA 6.48,  48.44cc  . 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 Wears dentures    upper    Past Surgical History:  Procedure Laterality Date  . CYSTOSCOPY N/A 10/15/2016   Procedure: CYSTOSCOPY FLEXIBLE;  Surgeon: [name], MD;  [facility]: [facility];  Service: Urology;  Laterality: N/A;  NO SEEDS FOUND IN BLADDER  . CYSTOSCOPY N/A 08/01/2020   Procedure: CYSTOSCOPY/removal of bladder stone and bladder neck biopsy;  Surgeon: [name], MD;  Location[facility];  Service: Urology;  Laterality: N/A;  . IR FLUORO GUIDE CV LINE RIGHT  01/09/2019  . IR US GUIDE VASC ACCESS RIGHT  01/09/2019  . ORIF LEFT ANKLE  1980's  . PROSTATE BIOPSY    . RADIOACTIVE SEED IMPLANT N/A 10/15/2016   Procedure: RADIOACTIVE SEED IMPLANT/BRACHYTHERAPY IMPLANT;  Surgeon: [name], MD;  [facility];  Service: Urology;  Laterality: N/A;  [place]  . REPAIR EXTENSOR TENDON Right 06/02/2016   Procedure: RIGHT LONG FINGER EXTENSOR TENDON REPAIR;  Surgeon: Milly Jakob, MD;  Location: [place];  Service: Orthopedics;  Laterality: Right;  . TOTAL KNEE ARTHROPLASTY Right 2010  approx.  Marland Kitchen TOTAL KNEE ARTHROPLASTY Left 12/31/2020   Procedure: TOTAL KNEE ARTHROPLASTY;  Surgeon: [name], MD;  [facility];  Service: Orthopedics;  Laterality: Left;  . TRANSURETHRAL INCISION OF BLADDER NECK N/A 07/04/2019   Procedure: CYSTOSCOPY WITH INCISION OF BLADDER NECK CONTRACTURE, BALLOON DILITATION;  Surgeon: [name], MD;  Location: [facility];  Service: Urology;  Laterality: N/A;  . TRANSURETHRAL INCISION OF BLADDER NECK N/A 08/01/2020   Procedure: INCISION OF  BLADDER NECK;  Surgeon: [name], MD;  Location: [facility];  Service: Urology;  Laterality: N/A;  1 HR  . TRANSURETHRAL RESECTION OF PROSTATE N/A 06/16/2018   Procedure: TRANSURETHRAL RESECTION OF THE PROSTATE (TURP);  Surgeon: [name], MD;  [facility];  Service: Urology;  Laterality: N/A;    There were no vitals filed for this visit.   Subjective Assessment - 01/20/21 0930    Subjective "I am noticing more stiffness in the knee, and pain is at about a 6/10 today.    Patient Stated Goals get back to mowing the lawn, get back to hitting the dance floor.    Currently in Pain? Yes    Pain Score 6     Pain Location Knee    Pain Orientation Left    Pain Descriptors / Indicators Throbbing;Sore    Pain Onset More than a month ago    Pain Frequency Intermittent              [facility] PT Assessment - 01/20/21 0001      Assessment   Medical Diagnosis L TKA    Referring Provider (PT) [name], MD      AROM   Left Knee Extension 11    Left Knee Flexion 101                         [facility] Adult PT Treatment/Exercise - 01/20/21 0001      Knee/Hip Exercises: Stretches   Passive Hamstring Stretch 2 reps;30 seconds;Left   with strap   Quad Stretch 3 reps;30 seconds   prone with strap, unable to lay prone so performed in thomas stretch position     Knee/Hip Exercises: Aerobic   Recumbent Bike x 6 min forward   lowering seat at 3 min to promote flexion     Knee/Hip Exercises: Seated   Long Arc Quad 2 sets;10 reps   3#   Knee/Hip Flexion 2 x 10 green band for l knee flexion      Knee/Hip Exercises: Supine   Straight Leg Raises Left;1 set;10 reps      Vasopneumatic   Number Minutes Vasopneumatic  10 minutes    Vasopnuematic Location  Knee    Vasopneumatic Pressure Medium    Vasopneumatic Temperature  34      Manual Therapy   Manual Therapy Passive ROM    Joint Mobilization grade III PA/AP tibiofemoral joint, A/P femur mobs , patella  mobs all planes    Passive ROM L knee flexion with contralateral knee extension for reciprocal inhibition                    PT Short Term Goals -  01/20/21 1007      PT SHORT TERM GOAL #1   Title pt to be IND with inital HEP    Status On-going      PT SHORT TERM GOAL #2   Title increase knee AROM total arc to >/= 10 - 90 degrees    Status On-going      PT SHORT TERM GOAL #3   Title reduce max pain to </= 4/10 for functional and therapeutic progression    Period Weeks    Status On-going             PT Long Term Goals - 01/03/21 1032      PT LONG TERM GOAL #1   Title increase L knee total arc ROM to >/= 5 - 120 for functional ROM required for efficient gait and general mobility    Baseline see flow sheet    Time 8    Period Weeks    Status New    Target Date 02/28/21      PT LONG TERM GOAL #2   Title increase gross LLE strength to >/= 4+/5 to promote stability and safety with walking/ standing    Baseline unable to assess strength due to pain    Time 8    Period Weeks    Status New    Target Date 02/28/21      PT LONG TERM GOAL #3   Title pt to be abel to sit, stand and walk for >/= 45 min with LRAD for in home and short community distances with max pain of </= 2/10    Baseline sit 20 min, stand 5 min , walk 20 min    Time 8    Period Weeks    Status New    Target Date 02/28/21      PT LONG TERM GOAL #4   Title improve LEFS score to >/= 50/ 80 to demo improvement in function    Baseline inital score 12/80    Time 8    Status New    Target Date 02/28/21      PT LONG TERM GOAL #5   Title pt to be IND with all HEP given and is able to maintain and progress current ROM    Baseline no previous HEP    Time 8    Period Weeks    Status New    Target Date 02/28/21                 Plan - 01/20/21 0959    Clinical Impression Statement Bradley Ray reports pain at 6/10 today but conitnues to report relief of pain with exercises. continued working on  knee ROM which today measured 11 - 101 today. continued working knee ROM with mobs and reciprocal inhibition technqiues and gross knee/ hip strengthening. He continues to make good progress with physical therapy, continued vaso end of session to calm down pain.    PT Treatment/Interventions ADLs/Self Care Home Management;Cryotherapy;Gait training;Stair training;Functional mobility training;Therapeutic activities;Therapeutic exercise;Balance training;Neuromuscular re-education;Patient/family education;Manual techniques;Passive range of motion;Scar mobilization;Taping;Vasopneumatic Device    PT Next Visit Plan review/ update HEP (Hx of CX no heat, estim, [place]) , knee ROM, patellar mobs, gait training, general strength, vaso PRN for pain / swelling    Consulted and Agree with Plan of Care Patient           Patient will benefit from skilled therapeutic intervention in order to improve the following deficits and impairments:  Improper body mechanics,Increased muscle spasms,Decreased strength,Abnormal  gait,Pain,Decreased activity tolerance,Decreased endurance,Decreased range of motion,Increased edema  Visit Diagnosis: Chronic pain of left knee  Localized edema  Stiffness of left knee, not elsewhere classified  Muscle weakness (generalized)  Other abnormalities of gait and mobility  Total knee replacement status, left     Problem List Patient Active Problem List   Diagnosis Date Noted  . S/P total knee arthroplasty, left 12/31/2020  . Acquired contracture of bladder neck 07/04/2019  . Hypertriglyceridemia 01/07/2019  . Chest pain 01/07/2019  . Chronic diastolic CHF (congestive heart failure) ([place]) 01/07/2019  . AKI (acute kidney injury) ([place]) 01/07/2019  . Shock liver 01/07/2019  . 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 Malignant neoplasm of prostate ([place]) 08/10/2016  . 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 Essential hypertension 11/26/2015  . 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 High  anion gap metabolic acidosis 52/77/8242  . Substance abuse in remission [facility]) 11/26/2015    Starr Lake PT, DPT, LAT, ATC  01/20/21  10:10 AM      [facility] [address] [place], [place], [address] Phone: [phone]   Fax:  [phone]  Name: Bradley Ray MRN: [id number] Date of Birth: 11-Aug-1956

## 2021-01-22 ENCOUNTER — Other Ambulatory Visit: Payer: Self-pay

## 2021-01-22 ENCOUNTER — Ambulatory Visit: Payer: Medicaid Other | Admitting: Physical Therapy

## 2021-01-22 DIAGNOSIS — M25562 Pain in left knee: Secondary | ICD-10-CM | POA: Diagnosis not present

## 2021-01-22 DIAGNOSIS — R2689 Other abnormalities of gait and mobility: Secondary | ICD-10-CM

## 2021-01-22 DIAGNOSIS — G8929 Other chronic pain: Secondary | ICD-10-CM

## 2021-01-22 DIAGNOSIS — M6281 Muscle weakness (generalized): Secondary | ICD-10-CM

## 2021-01-22 DIAGNOSIS — R600 Localized edema: Secondary | ICD-10-CM

## 2021-01-22 DIAGNOSIS — Z96652 Presence of left artificial knee joint: Secondary | ICD-10-CM

## 2021-01-22 DIAGNOSIS — M25662 Stiffness of left knee, not elsewhere classified: Secondary | ICD-10-CM

## 2021-01-22 NOTE — Therapy (Signed)
[place], [place], [address] Phone: [phone]   Fax:  [phone]  Physical Therapy Treatment  Patient Details  Name: Bradley Ray MRN: [id number] Date of Birth: [birth year] Referring Provider (PT): [name], MD   Encounter Date: 01/22/2021   PT End of Session - 01/22/21 0933    Visit Number 7    Number of Visits 17    Date for PT Re-Evaluation 02/28/21    Authorization Type UHC managed MCD    Authorization - Visit Number 6    Authorization - Number of Visits 27    PT Start Time 0929    PT Stop Time 1007    PT Time Calculation (min) 38 min    Activity Tolerance Patient tolerated treatment well;Patient limited by pain    Behavior During Therapy [facility] for tasks assessed/performed           Past Medical History:  Diagnosis Date  . Alcoholism ([place])    per epic documentation long hx dependence--- (06-30-2019  per pt was drinking average 3 shots dialy until 03/ 2020, since then one beer weekly)  . Arthritis    knees  . Asthma   . Beta thalassemia trait    per hematologist/ oncologist note- dr Dorita Sciara--  per blood work-up  dx 02/ 2017  . Bladder neck contracture   . BPH with obstruction/lower urinary tract symptoms   . Chronic kidney disease    Hx: of Hemodyalysis  . Cocaine abuse, in remission ([place])    06-30-2019   per pt states last used 12/ 2017  . COVID 11/09/2019   loss of taste and smell and appetite x 14 days all symptoms resolved  . ED (erectile dysfunction)   . Hepatitis    hepatitis a age 79  . History of acute renal failure 12/2018   kidneys fully recovered no nephrologist per pt  . History of chest pain    01-06-2019 hospital admission-- dx acute renal failure, hypotensive with associated shock liver, SIRS with tachycardia/ tachyapnea,  chf diastolic  ;   [id number]  per pt no chest pain since and everything resolved , followed by pcp  . Hyperlipidemia   . Hypertension   .  Prostate cancer [facility]) urologist-  dr wrenn/  oncologist-  dr Tammi Klippel   dx 07-02-2016,  T1c,  Gleason 3+3,  PSA 6.48,  48.44cc  . 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 Wears dentures    upper    Past Surgical History:  Procedure Laterality Date  . CYSTOSCOPY N/A 10/15/2016   Procedure: CYSTOSCOPY FLEXIBLE;  Surgeon: [name], MD;  [facility];  Service: Urology;  Laterality: N/A;  NO SEEDS FOUND IN BLADDER  . CYSTOSCOPY N/A 08/01/2020   Procedure: CYSTOSCOPY/removal of bladder stone and bladder neck biopsy;  Surgeon: [name], MD;  Location: [facility];  Service: Urology;  Laterality: N/A;  . IR FLUORO GUIDE CV LINE RIGHT  01/09/2019  . IR US GUIDE VASC ACCESS RIGHT  01/09/2019  . ORIF LEFT ANKLE  1980's  . PROSTATE BIOPSY    . RADIOACTIVE SEED IMPLANT N/A 10/15/2016   Procedure: RADIOACTIVE SEED IMPLANT/BRACHYTHERAPY IMPLANT;  Surgeon: [name], MD;  [facility];  Service: Urology;  Laterality: N/A;  [place]  . REPAIR EXTENSOR TENDON Right 06/02/2016   Procedure: RIGHT LONG FINGER EXTENSOR TENDON REPAIR;  Surgeon: Milly Jakob, MD;  Location: [facility];  Service: Orthopedics;  Laterality: Right;  . TOTAL KNEE ARTHROPLASTY Right 2010  approx.  Marland Kitchen TOTAL KNEE ARTHROPLASTY Left 12/31/2020   Procedure: TOTAL KNEE ARTHROPLASTY;  Surgeon: [name], MD;  [facility];  Service: Orthopedics;  Laterality: Left;  . TRANSURETHRAL INCISION OF BLADDER NECK N/A 07/04/2019   Procedure: CYSTOSCOPY WITH INCISION OF BLADDER NECK CONTRACTURE, BALLOON DILITATION;  Surgeon: [name], MD;  Location: [facility];  Service: Urology;  Laterality: N/A;  . TRANSURETHRAL INCISION OF BLADDER NECK N/A 08/01/2020   Procedure: INCISION OF  BLADDER NECK;  Surgeon: [name], MD;  Location[facility];  Service: Urology;  Laterality: N/A;  1 HR  . TRANSURETHRAL RESECTION OF PROSTATE N/A 06/16/2018   Procedure: TRANSURETHRAL RESECTION OF THE PROSTATE (TURP);  Surgeon: [name], MD;  [facility];  Service: Urology;  Laterality: N/A;    There were no vitals filed for this visit.   Subjective Assessment - 01/22/21 0933    Subjective " I am still sore, I woke up this morning and did some workout. I am ready to push it more today."    Patient Stated Goals get back to mowing the lawn, get back to hitting the dance floor.    Currently in Pain? Yes    Pain Score 5     Pain Location Knee    Pain Orientation Left    Pain Descriptors / Indicators Aching;Sore    Pain Type Surgical pain              [facility] PT Assessment - 01/22/21 0001      Assessment   Medical Diagnosis L TKA    Referring Provider (PT) [name], MD      AROM   Left Knee Extension 9    Left Knee Flexion 102                         [facility] Adult PT Treatment/Exercise - 01/22/21 0001      Knee/Hip Exercises: Stretches   Passive Hamstring Stretch 2 reps;30 seconds      Knee/Hip Exercises: Aerobic   Recumbent Bike L1 x 6 min   lowering seat every 2 min to promote knee flexion     Knee/Hip Exercises: Machines for Strengthening   Cybex Leg Press 1 x 10, 1 x 15 bil LE 40#      Knee/Hip Exercises: Standing   Forward Step Up 2 sets;10 reps;Step Height: 6";Left      Knee/Hip Exercises: Seated   Long Arc Quad 2 sets;10 reps   3#     Manual Therapy   Manual therapy comments tack and stretch for the hamstring    Joint Mobilization grade III PA/AP tibiofemoral joint, A/P femur mobs , patella mobs all planes                    PT Short Term Goals - 01/20/21 1007      PT SHORT TERM GOAL #1   Title pt to be IND with inital HEP    Status On-going      PT SHORT TERM GOAL #2   Title increase knee AROM  total arc to >/= 10 - 90 degrees    Status On-going  PT SHORT TERM GOAL #3   Title reduce max pain to </= 4/10 for functional and therapeutic progression    Period Weeks    Status On-going             PT Long Term Goals - 01/03/21 1032      PT LONG TERM GOAL #1   Title increase L knee total arc ROM to >/= 5 - 120 for functional ROM required for efficient gait and general mobility    Baseline see flow sheet    Time 8    Period Weeks    Status New    Target Date 02/28/21      PT LONG TERM GOAL #2   Title increase gross LLE strength to >/= 4+/5 to promote stability and safety with walking/ standing    Baseline unable to assess strength due to pain    Time 8    Period Weeks    Status New    Target Date 02/28/21      PT LONG TERM GOAL #3   Title pt to be abel to sit, stand and walk for >/= 45 min with LRAD for in home and short community distances with max pain of </= 2/10    Baseline sit 20 min, stand 5 min , walk 20 min    Time 8    Period Weeks    Status New    Target Date 02/28/21      PT LONG TERM GOAL #4   Title improve LEFS score to >/= 50/ 80 to demo improvement in function    Baseline inital score 12/80    Time 8    Status New    Target Date 02/28/21      PT LONG TERM GOAL #5   Title pt to be IND with all HEP given and is able to maintain and progress current ROM    Baseline no previous HEP    Time 8    Period Weeks    Status New    Target Date 02/28/21                 Plan - 01/22/21 1003    Clinical Impression Statement pt conitnues to make progress with ROM increase arc to 9 -102 degrees today. continued working on knee ROM and grosss hip/ knee strength progressing load with leg press and step ups using LLE. He did well with all exercises today. He reported feeling better following session and declined modalities.    PT Treatment/Interventions ADLs/Self Care Home Management;Cryotherapy;Gait training;Stair training;Functional mobility  training;Therapeutic activities;Therapeutic exercise;Balance training;Neuromuscular re-education;Patient/family education;Manual techniques;Passive range of motion;Scar mobilization;Taping;Vasopneumatic Device    PT Next Visit Plan update HEP (Hx of CX no heat, estim, [place]) , knee ROM, patellar mobs, gait training, general strength, vaso PRN for pain / swelling    PT Home Exercise Plan [id number]    Consulted and Agree with Plan of Care Patient           Patient will benefit from skilled therapeutic intervention in order to improve the following deficits and impairments:  Improper body mechanics,Increased muscle spasms,Decreased strength,Abnormal gait,Pain,Decreased activity tolerance,Decreased endurance,Decreased range of motion,Increased edema  Visit Diagnosis: Chronic pain of left knee  Localized edema  Stiffness of left knee, not elsewhere classified  Muscle weakness (generalized)  Other abnormalities of gait and mobility  Total knee replacement status, left     Problem List Patient Active Problem List   Diagnosis Date Noted  . S/P total knee  arthroplasty, left 12/31/2020  . Acquired contracture of bladder neck 07/04/2019  . Hypertriglyceridemia 01/07/2019  . Chest pain 01/07/2019  . Chronic diastolic CHF (congestive heart failure) ([place]) 01/07/2019  . AKI (acute kidney injury) ([place]) 01/07/2019  . Shock liver 01/07/2019  . 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 Malignant neoplasm of prostate ([place]) 08/10/2016  . 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 Essential hypertension 11/26/2015  . 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 High anion gap metabolic acidosis 44/61/9012  . Substance abuse in remission [facility]) 11/26/2015   Bradley Ray PT, DPT, LAT, ATC  01/22/21  10:09 AM      [facility] [address] [place], [place], [address] Phone: [phone]   Fax:  [phone]  Name: Bradley Ray MRN: [id number] Date of  Birth: Sep 08, 1956

## 2021-02-04 ENCOUNTER — Other Ambulatory Visit: Payer: Self-pay

## 2021-02-04 ENCOUNTER — Ambulatory Visit: Payer: Medicaid Other | Admitting: Physical Therapy

## 2021-02-04 DIAGNOSIS — R600 Localized edema: Secondary | ICD-10-CM

## 2021-02-04 DIAGNOSIS — G8929 Other chronic pain: Secondary | ICD-10-CM

## 2021-02-04 DIAGNOSIS — M25562 Pain in left knee: Secondary | ICD-10-CM | POA: Diagnosis not present

## 2021-02-04 DIAGNOSIS — R2689 Other abnormalities of gait and mobility: Secondary | ICD-10-CM

## 2021-02-04 DIAGNOSIS — M25662 Stiffness of left knee, not elsewhere classified: Secondary | ICD-10-CM

## 2021-02-04 DIAGNOSIS — Z96652 Presence of left artificial knee joint: Secondary | ICD-10-CM

## 2021-02-04 DIAGNOSIS — M6281 Muscle weakness (generalized): Secondary | ICD-10-CM

## 2021-02-04 NOTE — Therapy (Signed)
[place], [place], [address] Phone: [phone]   Fax:  [phone]  Physical Therapy Treatment  Patient Details  Name: Bradley Ray MRN: [id number] Date of Birth: [birth year] Referring Provider (PT): [name], MD   Encounter Date: 02/04/2021   PT End of Session - 02/04/21 1101    Visit Number 8    Number of Visits 17    Date for PT Re-Evaluation 02/28/21    Authorization Type U[employer] managed MCD    Authorization - Visit Number 7    Authorization - Number of Visits 27    PT Start Time 1020    PT Stop Time 1100    PT Time Calculation (min) 40 min    Activity Tolerance Patient tolerated treatment well;Patient limited by pain    Behavior During Therapy [facility] for tasks assessed/performed           Past Medical History:  Diagnosis Date  . Alcoholism ([place])    per epic documentation long hx dependence--- (06-30-2019  per pt was drinking average 3 shots dialy until 03/ 2020, since then one beer weekly)  . Arthritis    knees  . Asthma   . Beta thalassemia trait    per hematologist/ oncologist note- dr Dorita Sciara--  per blood work-up  dx 02/ 2017  . Bladder neck contracture   . BPH with obstruction/lower urinary tract symptoms   . Chronic kidney disease    Hx: of Hemodyalysis  . Cocaine abuse, in remission ([place])    06-30-2019   per pt states last used 12/ 2017  . COVID 11/09/2019   loss of taste and smell and appetite x 14 days all symptoms resolved  . ED (erectile dysfunction)   . Hepatitis    hepatitis a age 34  . History of acute renal failure 12/2018   kidneys fully recovered no nephrologist per pt  . History of chest pain    01-06-2019 hospital admission-- dx acute renal failure, hypotensive with associated shock liver, SIRS with tachycardia/ tachyapnea,  chf diastolic  ;   [id number]  per pt no chest pain since and everything resolved , followed by pcp  . Hyperlipidemia   . Hypertension   .  Prostate cancer [facility]) urologist-  dr wrenn/  oncologist-  dr Tammi Klippel   dx 07-02-2016,  T1c,  Gleason 3+3,  PSA 6.48,  48.44cc  . 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 Wears dentures    upper    Past Surgical History:  Procedure Laterality Date  . CYSTOSCOPY N/A 10/15/2016   Procedure: CYSTOSCOPY FLEXIBLE;  Surgeon: [name], MD;  [facility]: [facility];  Service: Urology;  Laterality: N/A;  NO SEEDS FOUND IN BLADDER  . CYSTOSCOPY N/A 08/01/2020   Procedure: CYSTOSCOPY/removal of bladder stone and bladder neck biopsy;  Surgeon: [name], MD;  Location: [facility];  Service: Urology;  Laterality: N/A;  . IR FLUORO GUIDE CV LINE RIGHT  01/09/2019  . IR US GUIDE VASC ACCESS RIGHT  01/09/2019  . ORIF LEFT ANKLE  1980's  . PROSTATE BIOPSY    . RADIOACTIVE SEED IMPLANT N/A 10/15/2016   Procedure: RADIOACTIVE SEED IMPLANT/BRACHYTHERAPY IMPLANT;  Surgeon: [name], MD;  [facility];  Service: Urology;  Laterality: N/A;  [place]  . REPAIR EXTENSOR TENDON Right 06/02/2016   Procedure: RIGHT LONG FINGER EXTENSOR TENDON REPAIR;  Surgeon: Milly Jakob, MD;  Location: [place];  Service: Orthopedics;  Laterality: Right;  . TOTAL KNEE ARTHROPLASTY Right 2010  approx.  Marland Kitchen TOTAL KNEE ARTHROPLASTY Left 12/31/2020   Procedure: TOTAL KNEE ARTHROPLASTY;  Surgeon: [name], MD;  [facility];  Service: Orthopedics;  Laterality: Left;  . TRANSURETHRAL INCISION OF BLADDER NECK N/A 07/04/2019   Procedure: CYSTOSCOPY WITH INCISION OF BLADDER NECK CONTRACTURE, BALLOON DILITATION;  Surgeon: [name], MD;  Location: [facility];  Service: Urology;  Laterality: N/A;  . TRANSURETHRAL INCISION OF BLADDER NECK N/A 08/01/2020   Procedure: INCISION OF  BLADDER NECK;  Surgeon: [name], MD;  Location[facility];  Service: Urology;  Laterality: N/A;  1 HR  . TRANSURETHRAL RESECTION OF PROSTATE N/A 06/16/2018   Procedure: TRANSURETHRAL RESECTION OF THE PROSTATE (TURP);  Surgeon: [name], MD;  [facility];  Service: Urology;  Laterality: N/A;    There were no vitals filed for this visit.   Subjective Assessment - 02/04/21 1022    Subjective Pt states his knee is doing good. Reports stiffness.    Limitations Standing;Walking    How long can you sit comfortably? 20 min    How long can you stand comfortably? 5 min    How long can you walk comfortably? 62 mi n    Patient Stated Goals get back to mowing the lawn, get back to hitting the dance floor.    Currently in Pain? No/denies              [facility] PT Assessment - 02/04/21 0001      AROM   Left Knee Extension 8    Left Knee Flexion 100      PROM   Left Knee Extension 4    Left Knee Flexion 108      Strength   Overall Strength --   in prone                        [facility] Adult PT Treatment/Exercise - 02/04/21 0001      Knee/Hip Exercises: Machines for Strengthening   Total Gym Leg Press 1 x 10 DL 35#; 1x10 SL 20#      Knee/Hip Exercises: Supine   Quad Sets Strengthening;10 reps;3 sets    Quad Sets Limitations while performing hamstring stretch with strap    Short Arc Quad Sets Strengthening;Left;10 reps;2 sets    Short Arc Quad Sets Limitations 3#      Vasopneumatic   Number Minutes Vasopneumatic  10 minutes    Vasopnuematic Location  Knee    Vasopneumatic Pressure Medium    Vasopneumatic Temperature  34      Manual Therapy   Manual Therapy Soft tissue mobilization    Joint Mobilization grade III PA/AP tibiofemoral joint, A/P femur mobs , patella mobs all planes    Soft tissue mobilization Scar massage cross friction and along incision    Passive ROM L knee flexion with PNF,  knee extension with PNF                   PT Education - 02/04/21 1101    Education Details Discussed scar massage at home    Person(s) Educated Patient    Methods Explanation;Verbal cues;Handout    Comprehension Verbalized understanding;Verbal cues required  PT Short Term Goals - 02/04/21 1059      PT SHORT TERM GOAL #1   Title pt to be IND with inital HEP    Status Achieved      PT SHORT TERM GOAL #2   Title increase knee AROM total arc to >/= 10 - 90 degrees    Status Achieved      PT SHORT TERM GOAL #3   Title reduce max pain to </= 4/10 for functional and therapeutic progression    Period Weeks    Status On-going             PT Long Term Goals - 01/03/21 1032      PT LONG TERM GOAL #1   Title increase L knee total arc ROM to >/= 5 - 120 for functional ROM required for efficient gait and general mobility    Baseline see flow sheet    Time 8    Period Weeks    Status New    Target Date 02/28/21      PT LONG TERM GOAL #2   Title increase gross LLE strength to >/= 4+/5 to promote stability and safety with walking/ standing    Baseline unable to assess strength due to pain    Time 8    Period Weeks    Status New    Target Date 02/28/21      PT LONG TERM GOAL #3   Title pt to be abel to sit, stand and walk for >/= 45 min with LRAD for in home and short community distances with max pain of </= 2/10    Baseline sit 20 min, stand 5 min , walk 20 min    Time 8    Period Weeks    Status New    Target Date 02/28/21      PT LONG TERM GOAL #4   Title improve LEFS score to >/= 50/ 80 to demo improvement in function    Baseline inital score 12/80    Time 8    Status New    Target Date 02/28/21      PT LONG TERM GOAL #5   Title pt to be IND with all HEP given and is able to maintain and progress current ROM    Baseline no previous HEP    Time 8    Period Weeks    Status New    Target Date 02/28/21                 Plan - 02/04/21 1053    Clinical Impression Statement Pt continues  to work hard improving knee ROM. Increased 5 to 108 PROM. Progressing pt's weightbearing in single leg press. Modified pt's knee flexion stretch into prone to get increased stretch. Discussed beginning scar massage. Pt has already been meeting STGs.    Personal Factors and Comorbidities Comorbidity 3+    Comorbidities hx of CKD, hypertension, CX    Examination-Activity Limitations Locomotion Level;Stand;Lift;Squat    PT Treatment/Interventions ADLs/Self Care Home Management;Cryotherapy;Gait training;Stair training;Functional mobility training;Therapeutic activities;Therapeutic exercise;Balance training;Neuromuscular re-education;Patient/family education;Manual techniques;Passive range of motion;Scar mobilization;Taping;Vasopneumatic Device    PT Next Visit Plan update HEP (Hx of CX no heat, estim, [place]) , knee ROM, patellar mobs, gait training, general strength, vaso PRN for pain / swelling    PT Home Exercise Plan [id number]    Consulted and Agree with Plan of Care Patient           Patient will benefit from skilled  therapeutic intervention in order to improve the following deficits and impairments:  Improper body mechanics,Increased muscle spasms,Decreased strength,Abnormal gait,Pain,Decreased activity tolerance,Decreased endurance,Decreased range of motion,Increased edema  Visit Diagnosis: Chronic pain of left knee  Localized edema  Stiffness of left knee, not elsewhere classified  Muscle weakness (generalized)  Other abnormalities of gait and mobility  Total knee replacement status, left     Problem List Patient Active Problem List   Diagnosis Date Noted  . S/P total knee arthroplasty, left 12/31/2020  . Acquired contracture of bladder neck 07/04/2019  . Hypertriglyceridemia 01/07/2019  . Chest pain 01/07/2019  . Chronic diastolic CHF (congestive heart failure) ([place]) 01/07/2019  . AKI (acute kidney injury) ([place]) 01/07/2019  . Shock liver 01/07/2019  . BPH with urinary  obstruction 06/16/2018  . Malignant neoplasm of prostate ([place]) 08/10/2016  . 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 Essential hypertension 11/26/2015  . 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 High anion gap metabolic acidosis 99/37/1696  . Substance abuse in remission [facility]) 11/26/2015    Johnothan Bascomb April Ma L Tenstrike PT, DPT 02/04/2021, 11:02 AM  [facility] [address] [place], [place], [address] Phone: [phone]   Fax:  [phone]  Name: Bradley Ray MRN: [id number] Date of Birth: [birth year]

## 2021-02-07 ENCOUNTER — Ambulatory Visit: Payer: Medicaid Other | Admitting: Physical Therapy

## 2021-02-10 ENCOUNTER — Ambulatory Visit: Payer: Medicaid Other | Attending: Orthopedic Surgery | Admitting: Physical Therapy

## 2021-02-10 ENCOUNTER — Encounter: Payer: Self-pay | Admitting: Physical Therapy

## 2021-02-10 ENCOUNTER — Other Ambulatory Visit: Payer: Self-pay

## 2021-02-10 DIAGNOSIS — G8929 Other chronic pain: Secondary | ICD-10-CM | POA: Diagnosis present

## 2021-02-10 DIAGNOSIS — Z96652 Presence of left artificial knee joint: Secondary | ICD-10-CM | POA: Diagnosis present

## 2021-02-10 DIAGNOSIS — M25562 Pain in left knee: Secondary | ICD-10-CM | POA: Insufficient documentation

## 2021-02-10 DIAGNOSIS — M6281 Muscle weakness (generalized): Secondary | ICD-10-CM

## 2021-02-10 DIAGNOSIS — R2689 Other abnormalities of gait and mobility: Secondary | ICD-10-CM | POA: Insufficient documentation

## 2021-02-10 DIAGNOSIS — R600 Localized edema: Secondary | ICD-10-CM | POA: Insufficient documentation

## 2021-02-10 DIAGNOSIS — M25662 Stiffness of left knee, not elsewhere classified: Secondary | ICD-10-CM | POA: Diagnosis present

## 2021-02-10 NOTE — Therapy (Signed)
[place], [place], [address] Phone: [phone]   Fax:  [phone]  Physical Therapy Treatment  Patient Details  Name: Bradley Ray MRN: [id number] Date of Birth: 07/11/1956 Referring Provider (PT): [name], MD   Encounter Date: 02/10/2021   PT End of Session - 02/10/21 0938    Visit Number 9    Number of Visits 17    Date for PT Re-Evaluation 02/28/21    Authorization Type UHC managed MCD    Authorization - Visit Number 8    Authorization - Number of Visits 27    PT Start Time 0932    PT Stop Time 1025    PT Time Calculation (min) 53 min           Past Medical History:  Diagnosis Date  . Alcoholism ([place])    per epic documentation long hx dependence--- (06-30-2019  per pt was drinking average 3 shots dialy until 03/ 2020, since then one beer weekly)  . Arthritis    knees  . Asthma   . Beta thalassemia trait    per hematologist/ oncologist note- dr Dorita Sciara--  per blood work-up  dx 02/ 2017  . Bladder neck contracture   . BPH with obstruction/lower urinary tract symptoms   . Chronic kidney disease    Hx: of Hemodyalysis  . Cocaine abuse, in remission ([place])    06-30-2019   per pt states last used 12/ 2017  . COVID 11/09/2019   loss of taste and smell and appetite x 14 days all symptoms resolved  . ED (erectile dysfunction)   . Hepatitis    hepatitis a age 65  . History of acute renal failure 12/2018   kidneys fully recovered no nephrologist per pt  . History of chest pain    01-06-2019 hospital admission-- dx acute renal failure, hypotensive with associated shock liver, SIRS with tachycardia/ tachyapnea,  chf diastolic  ;   [id number]  per pt no chest pain since and everything resolved , followed by pcp  . Hyperlipidemia   . Hypertension   . Prostate cancer [facility]) urologist-  dr wrenn/  oncologist-  dr Tammi Klippel   dx 07-02-2016,  T1c,  Gleason 3+3,  PSA 6.48,  48.44cc  . Splenic  infarct currently followed by pcp (previously dr Alvy Bimler -[facility])   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  anticoagulated w/ xarelto until june 2017 changed to asa 162mg  daily/   (06-30-2019 per pt now taking ASA 81mg  )  . Wears dentures    upper    Past Surgical History:  Procedure Laterality Date  . CYSTOSCOPY N/A 10/15/2016   Procedure: CYSTOSCOPY FLEXIBLE;  Surgeon: [name], MD;  [facility];  Service: Urology;  Laterality: N/A;  NO SEEDS FOUND IN BLADDER  . CYSTOSCOPY N/A 08/01/2020   Procedure: CYSTOSCOPY/removal of bladder stone and bladder neck biopsy;  Surgeon: [name], MD;  Location: [facility];  Service: Urology;  Laterality: N/A;  . IR FLUORO GUIDE CV LINE RIGHT  01/09/2019  . IR US GUIDE VASC ACCESS RIGHT  01/09/2019  . ORIF LEFT ANKLE  1980's  . PROSTATE BIOPSY    . RADIOACTIVE SEED IMPLANT N/A 10/15/2016   Procedure: RADIOACTIVE SEED IMPLANT/BRACHYTHERAPY IMPLANT;  Surgeon: [name], MD;  [facility];  Service: Urology;  Laterality: N/A;     66    SEEDS IMPLANTED  . REPAIR EXTENSOR TENDON Right 06/02/2016   Procedure: RIGHT LONG FINGER  EXTENSOR TENDON REPAIR;  Surgeon: Milly Jakob, MD;  Location: [place];  Service: Orthopedics;  Laterality: Right;  . TOTAL KNEE ARTHROPLASTY Right 2010  approx.  Marland Kitchen TOTAL KNEE ARTHROPLASTY Left 12/31/2020   Procedure: TOTAL KNEE ARTHROPLASTY;  Surgeon: [name], MD;  [facility];  Service: Orthopedics;  Laterality: Left;  . TRANSURETHRAL INCISION OF BLADDER NECK N/A 07/04/2019   Procedure: CYSTOSCOPY WITH INCISION OF BLADDER NECK CONTRACTURE, BALLOON DILITATION;  Surgeon: [name], MD;  Location: [facility];  Service: Urology;  Laterality: N/A;  . TRANSURETHRAL INCISION OF BLADDER NECK N/A 08/01/2020   Procedure: INCISION OF BLADDER NECK;  Surgeon: [name], MD;  Location: [facility];  Service: Urology;  Laterality: N/A;  1 HR  . TRANSURETHRAL  RESECTION OF PROSTATE N/A 06/16/2018   Procedure: TRANSURETHRAL RESECTION OF THE PROSTATE (TURP);  Surgeon: [name], MD;  [facility];  Service: Urology;  Laterality: N/A;    There were no vitals filed for this visit.   Subjective Assessment - 02/10/21 0937    Subjective A little tightness and min swelling after push mowing yesterday.    Pain Score 0-No pain    Pain Location Knee    Pain Orientation Left    Pain Descriptors / Indicators Tightness    Aggravating Factors  push mowing    Pain Relieving Factors therex , stretch              [facility] PT Assessment - 02/10/21 0001      AROM   Left Knee Extension 6    Left Knee Flexion 107   111 post manual and stretching                        [facility] Adult PT Treatment/Exercise - 02/10/21 0001      Knee/Hip Exercises: Stretches   Passive Hamstring Stretch 2 reps;30 seconds    Quad Stretch Limitations prone with strap 5 sec x 10 -pillow under anterior knee for comfort      Knee/Hip Exercises: Aerobic   Recumbent Bike L1 x 6 min   lowering seat every 2 min to promote knee flexion     Knee/Hip Exercises: Supine   Quad Sets Strengthening;1 set;10 reps   5 sec hold   Heel Slides AROM;10 reps      Knee/Hip Exercises: Prone   Hamstring Curl 10 reps      Vasopneumatic   Number Minutes Vasopneumatic  10 minutes    Vasopnuematic Location  Knee    Vasopneumatic Pressure Medium    Vasopneumatic Temperature  34      Manual Therapy   Joint Mobilization grade III PA/AP tibiofemoral joint, A/P femur mobs , patella mobs all planes    Soft tissue mobilization massage roller to left hamstring in supine distal to proximal rolling    Passive ROM L knee flexion and extension                    PT Short Term Goals - 02/10/21 0940      PT SHORT TERM GOAL #1   Title pt to be IND with inital HEP    Baseline no previous HEP    Time 4    Period Weeks    Status Achieved    Target Date 01/31/21      PT SHORT  TERM GOAL #2   Title increase knee AROM total arc to >/= 10 - 90  degrees    Baseline see flowsheet    Time 4    Period Weeks    Status Achieved    Target Date 02/14/21      PT SHORT TERM GOAL #3   Title reduce max pain to </= 4/10 for functional and therapeutic progression    Baseline max pain 4 or less in posterior knee    Time 4    Period Weeks    Status Achieved    Target Date 01/31/21             PT Long Term Goals - 01/03/21 1032      PT LONG TERM GOAL #1   Title increase L knee total arc ROM to >/= 5 - 120 for functional ROM required for efficient gait and general mobility    Baseline see flow sheet    Time 8    Period Weeks    Status New    Target Date 02/28/21      PT LONG TERM GOAL #2   Title increase gross LLE strength to >/= 4+/5 to promote stability and safety with walking/ standing    Baseline unable to assess strength due to pain    Time 8    Period Weeks    Status New    Target Date 02/28/21      PT LONG TERM GOAL #3   Title pt to be abel to sit, stand and walk for >/= 45 min with LRAD for in home and short community distances with max pain of </= 2/10    Baseline sit 20 min, stand 5 min , walk 20 min    Time 8    Period Weeks    Status New    Target Date 02/28/21      PT LONG TERM GOAL #4   Title improve LEFS score to >/= 50/ 80 to demo improvement in function    Baseline inital score 12/80    Time 8    Status New    Target Date 02/28/21      PT LONG TERM GOAL #5   Title pt to be IND with all HEP given and is able to maintain and progress current ROM    Baseline no previous HEP    Time 8    Period Weeks    Status New    Target Date 02/28/21                 Plan - 02/10/21 0946    Clinical Impression Statement Pt arrives measuring improved AROM from 6-107 degrees left knee. He reports improved tolerance to standing activites and was able to pushmow grass for 1 hour yesterday with min tighness and swelling following. He reports max  pain at 4/10 or less meeting all his STGS. Session focused on improving ROM and decreasing tension in posterior knee using manual STW. Afterward patient reported decreased posterior knee tightness and demonstrated improved AROM.    PT Next Visit Plan update HEP (Hx of CX no heat, estim, [place]) , knee ROM, patellar mobs, gait training, general strength, vaso PRN for pain / swelling    PT Home Exercise Plan [id number]           Patient will benefit from skilled therapeutic intervention in order to improve the following deficits and impairments:  Improper body mechanics,Increased muscle spasms,Decreased strength,Abnormal gait,Pain,Decreased activity tolerance,Decreased endurance,Decreased range of motion,Increased edema  Visit Diagnosis: Chronic pain of left knee  Localized edema  Stiffness of left  knee, not elsewhere classified  Muscle weakness (generalized)     Problem List Patient Active Problem List   Diagnosis Date Noted  . S/P total knee arthroplasty, left 12/31/2020  . Acquired contracture of bladder neck 07/04/2019  . Hypertriglyceridemia 01/07/2019  . Chest pain 01/07/2019  . Chronic diastolic CHF (congestive heart failure) ([place]) 01/07/2019  . AKI (acute kidney injury) ([place]) 01/07/2019  . Shock liver 01/07/2019  . 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 Malignant neoplasm of prostate ([place]) 08/10/2016  . 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 Essential hypertension 11/26/2015  . 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 High anion gap metabolic acidosis [id number]  . Substance abuse in remission [facility]) 11/26/2015    Dorene Ar, PTA 02/10/2021, 10:42 AM  [facility] [address] [place], [place], [address] Phone: [phone]   Fax:  [phone]  Name: TAOS LANNEN MRN: [id number] Date of Birth: December 13, 1955

## 2021-02-12 ENCOUNTER — Ambulatory Visit: Payer: Medicaid Other | Admitting: Physical Therapy

## 2021-02-12 ENCOUNTER — Other Ambulatory Visit: Payer: Self-pay

## 2021-02-12 DIAGNOSIS — R2689 Other abnormalities of gait and mobility: Secondary | ICD-10-CM

## 2021-02-12 DIAGNOSIS — M6281 Muscle weakness (generalized): Secondary | ICD-10-CM

## 2021-02-12 DIAGNOSIS — Z96652 Presence of left artificial knee joint: Secondary | ICD-10-CM

## 2021-02-12 DIAGNOSIS — M25662 Stiffness of left knee, not elsewhere classified: Secondary | ICD-10-CM

## 2021-02-12 DIAGNOSIS — R600 Localized edema: Secondary | ICD-10-CM

## 2021-02-12 DIAGNOSIS — M25562 Pain in left knee: Secondary | ICD-10-CM | POA: Diagnosis not present

## 2021-02-12 DIAGNOSIS — G8929 Other chronic pain: Secondary | ICD-10-CM

## 2021-02-12 NOTE — Therapy (Signed)
[place], [place], [address] Phone: [phone]   Fax:  [phone]  Physical Therapy Treatment  Patient Details  Name: Bradley Ray MRN: [id number] Date of Birth: 1956-09-17 Referring Provider (PT): [name], MD   Encounter Date: 02/12/2021   PT End of Session - 02/12/21 1058    Visit Number 10    Number of Visits 17    Date for PT Re-Evaluation 02/28/21    Authorization Type UHC managed MCD    Authorization - Visit Number 9    Authorization - Number of Visits 27    PT Start Time 1020    PT Stop Time 1104    PT Time Calculation (min) 44 min    Activity Tolerance Patient tolerated treatment well;Patient limited by pain    Behavior During Therapy [facility] for tasks assessed/performed           Past Medical History:  Diagnosis Date  . Alcoholism ([place])    per epic documentation long hx dependence--- (06-30-2019  per pt was drinking average 3 shots dialy until 03/ 2020, since then one beer weekly)  . Arthritis    knees  . Asthma   . Beta thalassemia trait    per hematologist/ oncologist note- dr Dorita Sciara--  per blood work-up  dx 02/ 2017  . Bladder neck contracture   . BPH with obstruction/lower urinary tract symptoms   . Chronic kidney disease    Hx: of Hemodyalysis  . Cocaine abuse, in remission ([place])    06-30-2019   per pt states last used 12/ 2017  . COVID 11/09/2019   loss of taste and smell and appetite x 14 days all symptoms resolved  . ED (erectile dysfunction)   . Hepatitis    hepatitis a age 42  . History of acute renal failure 12/2018   kidneys fully recovered no nephrologist per pt  . History of chest pain    01-06-2019 hospital admission-- dx acute renal failure, hypotensive with associated shock liver, SIRS with tachycardia/ tachyapnea,  chf diastolic  ;   [id number]  per pt no chest pain since and everything resolved , followed by pcp  . Hyperlipidemia   . Hypertension   .  Prostate cancer [facility]) urologist-  dr wrenn/  oncologist-  dr Tammi Klippel   dx 07-02-2016,  T1c,  Gleason 3+3,  PSA 6.48,  48.44cc  . 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 Wears dentures    upper    Past Surgical History:  Procedure Laterality Date  . CYSTOSCOPY N/A 10/15/2016   Procedure: CYSTOSCOPY FLEXIBLE;  Surgeon: [name], MD;  [facility];  Service: Urology;  Laterality: N/A;  NO SEEDS FOUND IN BLADDER  . CYSTOSCOPY N/A 08/01/2020   Procedure: CYSTOSCOPY/removal of bladder stone and bladder neck biopsy;  Surgeon: [name], MD;  Location: [facility];  Service: Urology;  Laterality: N/A;  . IR FLUORO GUIDE CV LINE RIGHT  01/09/2019  . IR US GUIDE VASC ACCESS RIGHT  01/09/2019  . ORIF LEFT ANKLE  1980's  . PROSTATE BIOPSY    . RADIOACTIVE SEED IMPLANT N/A 10/15/2016   Procedure: RADIOACTIVE SEED IMPLANT/BRACHYTHERAPY IMPLANT;  Surgeon: [name], MD;  [facility];  Service: Urology;  Laterality: N/A;  [place]  . REPAIR EXTENSOR TENDON Right 06/02/2016   Procedure: RIGHT LONG FINGER EXTENSOR TENDON REPAIR;  Surgeon: Milly Jakob, MD;  Location: [place];  Service: Orthopedics;  Laterality: Right;  . TOTAL KNEE ARTHROPLASTY Right 2010  approx.  Marland Kitchen TOTAL KNEE ARTHROPLASTY Left 12/31/2020   Procedure: TOTAL KNEE ARTHROPLASTY;  Surgeon: [name], MD;  [facility];  Service: Orthopedics;  Laterality: Left;  . TRANSURETHRAL INCISION OF BLADDER NECK N/A 07/04/2019   Procedure: CYSTOSCOPY WITH INCISION OF BLADDER NECK CONTRACTURE, BALLOON DILITATION;  Surgeon: [name], MD;  Location: [facility];  Service: Urology;  Laterality: N/A;  . TRANSURETHRAL INCISION OF BLADDER NECK N/A 08/01/2020   Procedure: INCISION OF  BLADDER NECK;  Surgeon: [name], MD;  Location: [facility];  Service: Urology;  Laterality: N/A;  1 HR  . TRANSURETHRAL RESECTION OF PROSTATE N/A 06/16/2018   Procedure: TRANSURETHRAL RESECTION OF THE PROSTATE (TURP);  Surgeon: [name], MD;  [facility];  Service: Urology;  Laterality: N/A;    There were no vitals filed for this visit.   Subjective Assessment - 02/12/21 1025    Subjective Pt states he has been doing well. Pt walks in with no cane this session. He reports some swelling but he has been icing.    Limitations Standing;Walking    How long can you sit comfortably? 20 min    How long can you stand comfortably? 5 min    How long can you walk comfortably? 70 mi n    Patient Stated Goals get back to mowing the lawn, get back to hitting the dance floor.    Currently in Pain? No/denies                             [facility] Adult PT Treatment/Exercise - 02/12/21 0001      Ambulation/Gait   Ambulation Distance (Feet) --   4 loops around rehab gym   Assistive device None    Gait Pattern Step-through pattern;Antalgic;Decreased weight shift to left;Wide base of support;Decreased dorsiflexion - left    Gait Comments Able to improve gait pattern with cueing for: increased heel strike/knee extension, decreased BOS, and even step length      Knee/Hip Exercises: Stretches   Sports administrator Limitations prone with strap 5 sec x 10 -pillow under anterior knee for comfort    Gastroc Stretch Right;Left;20 seconds    Soleus Stretch Right;Left;20 seconds      Knee/Hip Exercises: Standing   Terminal Knee Extension Strengthening;Right;2 sets;10 reps    Theraband Level (Terminal Knee Extension) Level 2 (Red)    Functional Squat 2 sets;10 reps    Functional Squat Limitations Counter support    SLS with Vectors forward tap on cone x10; side tap on cone x10    Other Standing Knee Exercises Lateral band walk red tband 3x10; forward and backward band walk red  tband 3x10    Other Standing Knee Exercises Feet together EC 2x30 sec; with head turns x10; head nods x10      Vasopneumatic   Number Minutes Vasopneumatic  10 minutes    Vasopnuematic Location  Knee    Vasopneumatic Pressure Low    Vasopneumatic Temperature  34      Manual Therapy   Passive ROM L knee flexion  PT Short Term Goals - 02/10/21 0940      PT SHORT TERM GOAL #1   Title pt to be IND with inital HEP    Baseline no previous HEP    Time 4    Period Weeks    Status Achieved    Target Date 01/31/21      PT SHORT TERM GOAL #2   Title increase knee AROM total arc to >/= 10 - 90 degrees    Baseline see flowsheet    Time 4    Period Weeks    Status Achieved    Target Date 02/14/21      PT SHORT TERM GOAL #3   Title reduce max pain to </= 4/10 for functional and therapeutic progression    Baseline max pain 4 or less in posterior knee    Time 4    Period Weeks    Status Achieved    Target Date 01/31/21             PT Long Term Goals - 01/03/21 1032      PT LONG TERM GOAL #1   Title increase L knee total arc ROM to >/= 5 - 120 for functional ROM required for efficient gait and general mobility    Baseline see flow sheet    Time 8    Period Weeks    Status New    Target Date 02/28/21      PT LONG TERM GOAL #2   Title increase gross LLE strength to >/= 4+/5 to promote stability and safety with walking/ standing    Baseline unable to assess strength due to pain    Time 8    Period Weeks    Status New    Target Date 02/28/21      PT LONG TERM GOAL #3   Title pt to be abel to sit, stand and walk for >/= 45 min with LRAD for in home and short community distances with max pain of </= 2/10    Baseline sit 20 min, stand 5 min , walk 20 min    Time 8    Period Weeks    Status New    Target Date 02/28/21      PT LONG TERM GOAL #4   Title improve LEFS score to >/= 50/ 80 to demo improvement in function    Baseline inital score  12/80    Time 8    Status New    Target Date 02/28/21      PT LONG TERM GOAL #5   Title pt to be IND with all HEP given and is able to maintain and progress current ROM    Baseline no previous HEP    Time 8    Period Weeks    Status New    Target Date 02/28/21                 Plan - 02/12/21 1056    Clinical Impression Statement Treatment session focused on gait training without a/d and progressing pt's HEP into standing. Pt has been improving his standing tolerance. Initiated balance/proprioception exercises this session. Pt continues to progress well with therapy.    Personal Factors and Comorbidities Comorbidity 3+    Comorbidities hx of CKD, hypertension, CX    Examination-Activity Limitations Locomotion Level;Stand;Lift;Squat    Rehab Potential Good    PT Frequency 2x / week    PT Duration 8 weeks    PT Treatment/Interventions ADLs/Self Care Home  Management;Cryotherapy;Gait training;Stair training;Functional mobility training;Therapeutic activities;Therapeutic exercise;Balance training;Neuromuscular re-education;Patient/family education;Manual techniques;Passive range of motion;Scar mobilization;Taping;Vasopneumatic Device    PT Next Visit Plan update HEP (Hx of CX no heat, estim, [place]) , knee ROM, patellar mobs, gait training, general strength, vaso PRN for pain / swelling    PT Home Exercise Plan [id number]    Consulted and Agree with Plan of Care Patient           Patient will benefit from skilled therapeutic intervention in order to improve the following deficits and impairments:  Improper body mechanics,Increased muscle spasms,Decreased strength,Abnormal gait,Pain,Decreased activity tolerance,Decreased endurance,Decreased range of motion,Increased edema  Visit Diagnosis: Chronic pain of left knee  Localized edema  Stiffness of left knee, not elsewhere classified  Muscle weakness (generalized)  Other abnormalities of gait and mobility  Total knee replacement  status, left     Problem List Patient Active Problem List   Diagnosis Date Noted  . S/P total knee arthroplasty, left 12/31/2020  . Acquired contracture of bladder neck 07/04/2019  . Hypertriglyceridemia 01/07/2019  . Chest pain 01/07/2019  . Chronic diastolic CHF (congestive heart failure) ([place]) 01/07/2019  . AKI (acute kidney injury) ([place]) 01/07/2019  . Shock liver 01/07/2019  . 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 Malignant neoplasm of prostate ([place]) 08/10/2016  . 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 Essential hypertension 11/26/2015  . 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 High anion gap metabolic acidosis 64/40/3474  . Substance abuse in remission [facility]) 11/26/2015    Jamier Urbas April Ma L [place] PT, DPT 02/12/2021, 11:01 AM  [facility] [address] [place], [place], [address] Phone: [phone]   Fax:  [phone]  Name: Bradley Ray MRN: [id number] Date of Birth: 18-Jun-1956

## 2021-02-17 ENCOUNTER — Ambulatory Visit: Payer: Medicaid Other | Admitting: Physical Therapy

## 2021-02-17 ENCOUNTER — Other Ambulatory Visit: Payer: Self-pay

## 2021-02-17 DIAGNOSIS — M25562 Pain in left knee: Secondary | ICD-10-CM | POA: Diagnosis not present

## 2021-02-17 DIAGNOSIS — R600 Localized edema: Secondary | ICD-10-CM

## 2021-02-17 DIAGNOSIS — M25662 Stiffness of left knee, not elsewhere classified: Secondary | ICD-10-CM

## 2021-02-17 DIAGNOSIS — M6281 Muscle weakness (generalized): Secondary | ICD-10-CM

## 2021-02-17 DIAGNOSIS — G8929 Other chronic pain: Secondary | ICD-10-CM

## 2021-02-17 NOTE — Therapy (Signed)
[place], [place], [address] Phone: [phone]   Fax:  [phone]  Physical Therapy Treatment  Patient Details  Name: Bradley Ray MRN: [id number] Date of Birth: 25-Jul-1956 Referring Provider (PT): [name], MD   Encounter Date: 02/17/2021   PT End of Session - 02/17/21 0937    Visit Number 11    Number of Visits 17    Date for PT Re-Evaluation 02/28/21    Authorization Type UHC managed MCD    Authorization - Visit Number 10    Authorization - Number of Visits 27    PT Start Time 0932    PT Stop Time 1010    PT Time Calculation (min) 38 min    Activity Tolerance Patient tolerated treatment well;Patient limited by pain    Behavior During Therapy [facility] for tasks assessed/performed           Past Medical History:  Diagnosis Date  . Alcoholism ([place])    per epic documentation long hx dependence--- (06-30-2019  per pt was drinking average 3 shots dialy until 03/ 2020, since then one beer weekly)  . Arthritis    knees  . Asthma   . Beta thalassemia trait    per hematologist/ oncologist note- dr Dorita Sciara--  per blood work-up  dx 02/ 2017  . Bladder neck contracture   . BPH with obstruction/lower urinary tract symptoms   . Chronic kidney disease    Hx: of Hemodyalysis  . Cocaine abuse, in remission ([place])    06-30-2019   per pt states last used 12/ 2017  . COVID 11/09/2019   loss of taste and smell and appetite x 14 days all symptoms resolved  . ED (erectile dysfunction)   . Hepatitis    hepatitis a age 35  . History of acute renal failure 12/2018   kidneys fully recovered no nephrologist per pt  . History of chest pain    01-06-2019 hospital admission-- dx acute renal failure, hypotensive with associated shock liver, SIRS with tachycardia/ tachyapnea,  chf diastolic  ;   [id number]  per pt no chest pain since and everything resolved , followed by pcp  . Hyperlipidemia   . Hypertension    . Prostate cancer [facility]) urologist-  dr wrenn/  oncologist-  dr Tammi Klippel   dx 07-02-2016,  T1c,  Gleason 3+3,  PSA 6.48,  48.44cc  . 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 Wears dentures    upper    Past Surgical History:  Procedure Laterality Date  . CYSTOSCOPY N/A 10/15/2016   Procedure: CYSTOSCOPY FLEXIBLE;  Surgeon: [name], MD;  [facility];  Service: Urology;  Laterality: N/A;  NO SEEDS FOUND IN BLADDER  . CYSTOSCOPY N/A 08/01/2020   Procedure: CYSTOSCOPY/removal of bladder stone and bladder neck biopsy;  Surgeon: [name], MD;  Location: [facility];  Service: Urology;  Laterality: N/A;  . IR FLUORO GUIDE CV LINE RIGHT  01/09/2019  . IR US GUIDE VASC ACCESS RIGHT  01/09/2019  . ORIF LEFT ANKLE  1980's  . PROSTATE BIOPSY    . RADIOACTIVE SEED IMPLANT N/A 10/15/2016   Procedure: RADIOACTIVE SEED IMPLANT/BRACHYTHERAPY IMPLANT;  Surgeon: [name], MD;  [facility];  Service: Urology;  Laterality: N/A;  [place]  . REPAIR EXTENSOR TENDON Right 06/02/2016   Procedure: RIGHT LONG FINGER EXTENSOR TENDON REPAIR;  Surgeon: Milly Jakob, MD;  Location: [place];  Service: Orthopedics;  Laterality: Right;  . TOTAL KNEE ARTHROPLASTY Right 2010  approx.  Marland Kitchen TOTAL KNEE ARTHROPLASTY Left 12/31/2020   Procedure: TOTAL KNEE ARTHROPLASTY;  Surgeon: [name], MD;  [facility];  Service: Orthopedics;  Laterality: Left;  . TRANSURETHRAL INCISION OF BLADDER NECK N/A 07/04/2019   Procedure: CYSTOSCOPY WITH INCISION OF BLADDER NECK CONTRACTURE, BALLOON DILITATION;  Surgeon: [name], MD;  Location: [facility];  Service: Urology;  Laterality: N/A;  . TRANSURETHRAL INCISION OF BLADDER NECK N/A 08/01/2020   Procedure: INCISION OF  BLADDER NECK;  Surgeon: [name], MD;  Location: [facility];  Service: Urology;  Laterality: N/A;  1 HR  . TRANSURETHRAL RESECTION OF PROSTATE N/A 06/16/2018   Procedure: TRANSURETHRAL RESECTION OF THE PROSTATE (TURP);  Surgeon: [name], MD;  [facility];  Service: Urology;  Laterality: N/A;    There were no vitals filed for this visit.   Subjective Assessment - 02/17/21 0937    Subjective " I am not really having any pain, its mostly just stiffness."    Patient Stated Goals get back to mowing the lawn, get back to hitting the dance floor.    Currently in Pain? No/denies              [facility] PT Assessment - 02/17/21 0001      Assessment   Medical Diagnosis L TKA    Referring Provider (PT) [name], MD      AROM   Left Knee Extension 7    Left Knee Flexion 114                         [facility] Adult PT Treatment/Exercise - 02/17/21 0001      Knee/Hip Exercises: Stretches   Passive Hamstring Stretch 2 reps;30 seconds;Left   seated   Quad Stretch --   thomas stretch     Knee/Hip Exercises: Aerobic   Recumbent Bike L3 x 6 min   lowering seat every 2 min to promote knee flexion     Knee/Hip Exercises: Machines for Strengthening   Total Gym Leg Press 1 x 20 bil 40#, 2 x 10 con bil/ecc LLE 40#      Knee/Hip Exercises: Standing   Forward Step Up 1 set;Step Height: 6";10 reps;Left   bi UE HHA PRN   Step Down 1 set;10 reps;Step Height: 6"      Knee/Hip Exercises: Supine   Quad Sets 2 sets;10 reps;Strengthening;Left   with manual overpress with tibial ER mob   Straight Leg Raises Strengthening;2 sets;10 reps   combined with quad set and reset quad set between ea rep     Manual Therapy   Joint Mobilization tibia ER with active quad set with manual over pressure, tibiofemoral AP grade IV with active flexion between                    PT Short Term Goals - 02/10/21 0940      PT SHORT TERM GOAL #1   Title pt to be IND with  inital HEP    Baseline no previous HEP    Time 4    Period Weeks    Status Achieved    Target Date 01/31/21  PT SHORT TERM GOAL #2   Title increase knee AROM total arc to >/= 10 - 90 degrees    Baseline see flowsheet    Time 4    Period Weeks    Status Achieved    Target Date 02/14/21      PT SHORT TERM GOAL #3   Title reduce max pain to </= 4/10 for functional and therapeutic progression    Baseline max pain 4 or less in posterior knee    Time 4    Period Weeks    Status Achieved    Target Date 01/31/21             PT Long Term Goals - 01/03/21 1032      PT LONG TERM GOAL #1   Title increase L knee total arc ROM to >/= 5 - 120 for functional ROM required for efficient gait and general mobility    Baseline see flow sheet    Time 8    Period Weeks    Status New    Target Date 02/28/21      PT LONG TERM GOAL #2   Title increase gross LLE strength to >/= 4+/5 to promote stability and safety with walking/ standing    Baseline unable to assess strength due to pain    Time 8    Period Weeks    Status New    Target Date 02/28/21      PT LONG TERM GOAL #3   Title pt to be abel to sit, stand and walk for >/= 45 min with LRAD for in home and short community distances with max pain of </= 2/10    Baseline sit 20 min, stand 5 min , walk 20 min    Time 8    Period Weeks    Status New    Target Date 02/28/21      PT LONG TERM GOAL #4   Title improve LEFS score to >/= 50/ 80 to demo improvement in function    Baseline inital score 12/80    Time 8    Status New    Target Date 02/28/21      PT LONG TERM GOAL #5   Title pt to be IND with all HEP given and is able to maintain and progress current ROM    Baseline no previous HEP    Time 8    Period Weeks    Status New    Target Date 02/28/21                 Plan - 02/17/21 8588    Clinical Impression Statement pt arrives noting no pain just mostly stiffness. continued working knee ROM which he was able  to get 7 - 113 degrees today. Worked on hip/ knee strenghening progressing from double to single leg with. He did well with step up/ down exercise but required cues to avoid pulling himself up with his hands and use his legs which he improved with in the activity.    PT Treatment/Interventions ADLs/Self Care Home Management;Cryotherapy;Gait training;Stair training;Functional mobility training;Therapeutic activities;Therapeutic exercise;Balance training;Neuromuscular re-education;Patient/family education;Manual techniques;Passive range of motion;Scar mobilization;Taping;Vasopneumatic Device    PT Next Visit Plan update HEP (Hx of CX no heat, estim, [place]) , knee ROM, patellar mobs, gait training, general strength, vaso PRN for pain / swelling    PT Home Exercise Plan [id number]    Consulted and Agree with Plan of Care Patient  Patient will benefit from skilled therapeutic intervention in order to improve the following deficits and impairments:  Improper body mechanics,Increased muscle spasms,Decreased strength,Abnormal gait,Pain,Decreased activity tolerance,Decreased endurance,Decreased range of motion,Increased edema  Visit Diagnosis: Chronic pain of left knee  Localized edema  Stiffness of left knee, not elsewhere classified  Muscle weakness (generalized)     Problem List Patient Active Problem List   Diagnosis Date Noted  . S/P total knee arthroplasty, left 12/31/2020  . Acquired contracture of bladder neck 07/04/2019  . Hypertriglyceridemia 01/07/2019  . Chest pain 01/07/2019  . Chronic diastolic CHF (congestive heart failure) ([place]) 01/07/2019  . AKI (acute kidney injury) ([place]) 01/07/2019  . Shock liver 01/07/2019  . 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 Malignant neoplasm of prostate ([place]) 08/10/2016  . 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 Essential hypertension 11/26/2015  . 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 High anion gap metabolic acidosis 29/47/6546  . Substance  abuse in remission [facility]) 11/26/2015    Starr [place] PT, DPT, LAT, ATC  02/17/21  10:11 AM      [facility] [address] [place], [place], [address] Phone: [phone]   Fax:  [phone]  Name: Bradley Ray MRN: [id number] Date of Birth: 26-Jan-1956

## 2021-02-19 ENCOUNTER — Encounter: Payer: Self-pay | Admitting: Physical Therapy

## 2021-02-19 ENCOUNTER — Ambulatory Visit: Payer: Medicaid Other | Admitting: Physical Therapy

## 2021-02-19 ENCOUNTER — Other Ambulatory Visit: Payer: Self-pay

## 2021-02-19 DIAGNOSIS — M25562 Pain in left knee: Secondary | ICD-10-CM | POA: Diagnosis not present

## 2021-02-19 DIAGNOSIS — M6281 Muscle weakness (generalized): Secondary | ICD-10-CM

## 2021-02-19 DIAGNOSIS — G8929 Other chronic pain: Secondary | ICD-10-CM

## 2021-02-19 DIAGNOSIS — Z96652 Presence of left artificial knee joint: Secondary | ICD-10-CM

## 2021-02-19 DIAGNOSIS — R600 Localized edema: Secondary | ICD-10-CM

## 2021-02-19 DIAGNOSIS — R2689 Other abnormalities of gait and mobility: Secondary | ICD-10-CM

## 2021-02-19 DIAGNOSIS — M25662 Stiffness of left knee, not elsewhere classified: Secondary | ICD-10-CM

## 2021-02-19 NOTE — Therapy (Signed)
[place], [place], [address] Phone: [phone]   Fax:  [phone]  Physical Therapy Treatment  Patient Details  Name: DANNY YACKLEY MRN: [id number] Date of Birth: [birth year] Referring Provider (PT): [name], MD   Encounter Date: 02/19/2021   PT End of Session - 02/19/21 0938    Visit Number 12    Number of Visits 17    Date for PT Re-Evaluation 02/28/21    Authorization Type U[employer] managed MCD    Authorization - Visit Number 11    Authorization - Number of Visits 27    PT Start Time 2355   patient arrived late   PT Stop Time 1024    PT Time Calculation (min) 49 min    Activity Tolerance Patient tolerated treatment well;Patient limited by pain           Past Medical History:  Diagnosis Date  . Alcoholism ([place])    per epic documentation long hx dependence--- (06-30-2019  per pt was drinking average 3 shots dialy until 03/ 2020, since then one beer weekly)  . Arthritis    knees  . Asthma   . Beta thalassemia trait    per hematologist/ oncologist note- dr Dorita Sciara--  per blood work-up  dx 02/ 2017  . Bladder neck contracture   . BPH with obstruction/lower urinary tract symptoms   . Chronic kidney disease    Hx: of Hemodyalysis  . Cocaine abuse, in remission ([place])    06-30-2019   per pt states last used 12/ 2017  . COVID 11/09/2019   loss of taste and smell and appetite x 14 days all symptoms resolved  . ED (erectile dysfunction)   . Hepatitis    hepatitis a age 65  . History of acute renal failure 12/2018   kidneys fully recovered no nephrologist per pt  . History of chest pain    01-06-2019 hospital admission-- dx acute renal failure, hypotensive with associated shock liver, SIRS with tachycardia/ tachyapnea,  chf diastolic  ;   [phone]  per pt no chest pain since and everything resolved , followed by pcp  . Hyperlipidemia   . Hypertension   . Prostate cancer [facility]) urologist-  dr  wrenn/  oncologist-  dr Tammi Klippel   dx 07-02-2016,  T1c,  Gleason 3+3,  PSA 6.48,  48.44cc  . 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 Wears dentures    upper    Past Surgical History:  Procedure Laterality Date  . CYSTOSCOPY N/A 10/15/2016   Procedure: CYSTOSCOPY FLEXIBLE;  Surgeon: [name], MD;  Location: [facility];  Service: Urology;  Laterality: N/A;  NO SEEDS FOUND IN BLADDER  . CYSTOSCOPY N/A 08/01/2020   Procedure: CYSTOSCOPY/removal of bladder stone and bladder neck biopsy;  Surgeon: [name], MD;  Location: [facility];  Service: Urology;  Laterality: N/A;  . IR FLUORO GUIDE CV LINE RIGHT  01/09/2019  . IR US GUIDE VASC ACCESS RIGHT  01/09/2019  . ORIF LEFT ANKLE  1980's  . PROSTATE BIOPSY    . RADIOACTIVE SEED IMPLANT N/A 10/15/2016   Procedure: RADIOACTIVE SEED IMPLANT/BRACHYTHERAPY IMPLANT;  Surgeon: [name], MD;  [facility];  Service: Urology;  Laterality: N/A;     66  SEEDS IMPLANTED  . REPAIR EXTENSOR TENDON Right 06/02/2016   Procedure: RIGHT LONG FINGER EXTENSOR TENDON REPAIR;  Surgeon: Milly Jakob, MD;  Location: [place];  Service: Orthopedics;  Laterality: Right;  . TOTAL KNEE ARTHROPLASTY Right 2010  approx.  Marland Kitchen TOTAL KNEE ARTHROPLASTY Left 12/31/2020   Procedure: TOTAL KNEE ARTHROPLASTY;  Surgeon: [name], MD;  [facility];  Service: Orthopedics;  Laterality: Left;  . TRANSURETHRAL INCISION OF BLADDER NECK N/A 07/04/2019   Procedure: CYSTOSCOPY WITH INCISION OF BLADDER NECK CONTRACTURE, BALLOON DILITATION;  Surgeon: [name], MD;  Location: [facility];  Service: Urology;  Laterality: N/A;  . TRANSURETHRAL INCISION OF BLADDER NECK N/A 08/01/2020   Procedure: INCISION OF BLADDER NECK;  Surgeon: [name], MD;   Location: [facility];  Service: Urology;  Laterality: N/A;  1 HR  . TRANSURETHRAL RESECTION OF PROSTATE N/A 06/16/2018   Procedure: TRANSURETHRAL RESECTION OF THE PROSTATE (TURP);  Surgeon: [name], MD;  [facility];  Service: Urology;  Laterality: N/A;    There were no vitals filed for this visit.   Subjective Assessment - 02/19/21 0940    Subjective "I am feeling really sore since the last session I couldn't really even go use the restroom."    Patient Stated Goals get back to mowing the lawn, get back to hitting the dance floor.    Currently in Pain? Yes    Pain Score 0-No pain   reports just muscle soreness from last session   Pain Location Knee    Pain Orientation Left    Pain Descriptors / Indicators Aching    Pain Type Chronic pain    Aggravating Factors  doing too much    Pain Relieving Factors exercise              [facility] PT Assessment - 02/19/21 0001      Assessment   Medical Diagnosis L TKA    Referring Provider (PT) [name], MD      AROM   Left Knee Extension 6    Left Knee Flexion 113                         [facility] Adult PT Treatment/Exercise - 02/19/21 0001      Knee/Hip Exercises: Stretches   Passive Hamstring Stretch 2 reps;30 seconds;Left    Quad Stretch Limitations supine with strap 2 x 30 sec    Other Knee/Hip Stretches single knee to chest 2 x 30      Knee/Hip Exercises: Aerobic   Recumbent Bike L1 x 6 min      Knee/Hip Exercises: Standing   Heel Raises 2 sets;20 reps    Hip Abduction Stengthening;2 sets;10 reps;Knee straight      Knee/Hip Exercises: Seated   Long Arc Quad Strengthening;Left;2 sets;15 reps   3#     Knee/Hip Exercises: Supine   Bridges Strengthening;Both;2 sets;10 reps      Vasopneumatic   Number Minutes Vasopneumatic  10 minutes    Vasopnuematic Location  Knee    Vasopneumatic Pressure Medium    Vasopneumatic Temperature  34                    PT Short Term  Goals - 02/10/21 0940      PT SHORT TERM GOAL #1   Title pt to be IND with inital HEP    Baseline no previous HEP    Time  4    Period Weeks    Status Achieved    Target Date 01/31/21      PT SHORT TERM GOAL #2   Title increase knee AROM total arc to >/= 10 - 90 degrees    Baseline see flowsheet    Time 4    Period Weeks    Status Achieved    Target Date 02/14/21      PT SHORT TERM GOAL #3   Title reduce max pain to </= 4/10 for functional and therapeutic progression    Baseline max pain 4 or less in posterior knee    Time 4    Period Weeks    Status Achieved    Target Date 01/31/21             PT Long Term Goals - 01/03/21 1032      PT LONG TERM GOAL #1   Title increase L knee total arc ROM to >/= 5 - 120 for functional ROM required for efficient gait and general mobility    Baseline see flow sheet    Time 8    Period Weeks    Status New    Target Date 02/28/21      PT LONG TERM GOAL #2   Title increase gross LLE strength to >/= 4+/5 to promote stability and safety with walking/ standing    Baseline unable to assess strength due to pain    Time 8    Period Weeks    Status New    Target Date 02/28/21      PT LONG TERM GOAL #3   Title pt to be abel to sit, stand and walk for >/= 45 min with LRAD for in home and short community distances with max pain of </= 2/10    Baseline sit 20 min, stand 5 min , walk 20 min    Time 8    Period Weeks    Status New    Target Date 02/28/21      PT LONG TERM GOAL #4   Title improve LEFS score to >/= 50/ 80 to demo improvement in function    Baseline inital score 12/80    Time 8    Status New    Target Date 02/28/21      PT LONG TERM GOAL #5   Title pt to be IND with all HEP given and is able to maintain and progress current ROM    Baseline no previous HEP    Time 8    Period Weeks    Status New    Target Date 02/28/21                 Plan - 02/19/21 [id number]    Clinical Impression Statement Pt arrives to  session noting increased soreness in his hips since the last session which is likely related to DOMs. continued working on hip/ knee strengthening with reduced weight/ resistance to help account for the amount of soreness he had coming into session today. he contineus to improve knee ROM with total arc today 6 - 113. end of session he noted reduced pain/ soreness.    PT Treatment/Interventions ADLs/Self Care Home Management;Cryotherapy;Gait training;Stair training;Functional mobility training;Therapeutic activities;Therapeutic exercise;Balance training;Neuromuscular re-education;Patient/family education;Manual techniques;Passive range of motion;Scar mobilization;Taping;Vasopneumatic Device    PT Next Visit Plan update HEP (Hx of CX no heat, estim, [place]) , knee ROM, patellar mobs, gait training, general strength, vaso PRN for pain / swelling    PT Home  Exercise Plan [id number]    Consulted and Agree with Plan of Care Patient           Patient will benefit from skilled therapeutic intervention in order to improve the following deficits and impairments:  Improper body mechanics,Increased muscle spasms,Decreased strength,Abnormal gait,Pain,Decreased activity tolerance,Decreased endurance,Decreased range of motion,Increased edema  Visit Diagnosis: Chronic pain of left knee  Localized edema  Stiffness of left knee, not elsewhere classified  Muscle weakness (generalized)  Other abnormalities of gait and mobility  Total knee replacement status, left     Problem List Patient Active Problem List   Diagnosis Date Noted  . S/P total knee arthroplasty, left 12/31/2020  . Acquired contracture of bladder neck 07/04/2019  . Hypertriglyceridemia 01/07/2019  . Chest pain 01/07/2019  . Chronic diastolic CHF (congestive heart failure) ([place]) 01/07/2019  . AKI (acute kidney injury) ([place]) 01/07/2019  . Shock liver 01/07/2019  . 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 Malignant neoplasm of prostate  ([place]) 08/10/2016  . 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 Essential hypertension 11/26/2015  . 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 High anion gap metabolic acidosis 85/63/1497  . Substance abuse in remission [facility]) 11/26/2015   Starr Lake PT, DPT, LAT, ATC  02/19/21  10:15 AM      [facility] [address] [place], [place], [address] Phone: [phone]   Fax:  [phone]  Name: GERSON FAUTH MRN: [id number] Date of Birth: November 13, 1955

## 2021-02-24 ENCOUNTER — Ambulatory Visit: Payer: Medicaid Other | Admitting: Physical Therapy

## 2021-02-24 ENCOUNTER — Telehealth: Payer: Self-pay | Admitting: Physical Therapy

## 2021-02-24 NOTE — Telephone Encounter (Signed)
Attempted calling, no answer and unable to leave voicemail.  Brallan Denio PT, DPT, LAT, ATC  02/24/21  2:35 PM

## 2021-02-26 ENCOUNTER — Ambulatory Visit: Payer: Medicaid Other | Admitting: Physical Therapy

## 2021-02-26 ENCOUNTER — Encounter: Payer: Self-pay | Admitting: Physical Therapy

## 2021-02-26 ENCOUNTER — Other Ambulatory Visit: Payer: Self-pay

## 2021-02-26 DIAGNOSIS — M6281 Muscle weakness (generalized): Secondary | ICD-10-CM

## 2021-02-26 DIAGNOSIS — M25562 Pain in left knee: Secondary | ICD-10-CM

## 2021-02-26 DIAGNOSIS — R600 Localized edema: Secondary | ICD-10-CM

## 2021-02-26 DIAGNOSIS — G8929 Other chronic pain: Secondary | ICD-10-CM

## 2021-02-26 DIAGNOSIS — M25662 Stiffness of left knee, not elsewhere classified: Secondary | ICD-10-CM

## 2021-02-26 NOTE — Therapy (Signed)
[place], [place], [address] Phone: [phone]   Fax:  [phone]  Physical Therapy Treatment  Patient Details  Name: Bradley Ray MRN: [id number] Date of Birth: [birth year] Referring Provider (PT): [name], MD   Encounter Date: 02/26/2021   PT End of Session - 02/26/21 1211    Visit Number 13    Number of Visits 17    Date for PT Re-Evaluation 02/28/21    Authorization Type UHC managed MCD    Authorization - Visit Number 12    Authorization - Number of Visits 27    PT Start Time 0930    PT Stop Time 1020    PT Time Calculation (min) 50 min           Past Medical History:  Diagnosis Date  . Alcoholism ([place])    per epic documentation long hx dependence--- (06-30-2019  per pt was drinking average 3 shots dialy until 03/ 2020, since then one beer weekly)  . Arthritis    knees  . Asthma   . Beta thalassemia trait    per hematologist/ oncologist note- dr Dorita Sciara--  per blood work-up  dx 02/ 2017  . Bladder neck contracture   . BPH with obstruction/lower urinary tract symptoms   . Chronic kidney disease    Hx: of Hemodyalysis  . Cocaine abuse, in remission ([place])    06-30-2019   per pt states last used 12/ 2017  . COVID 11/09/2019   loss of taste and smell and appetite x 14 days all symptoms resolved  . ED (erectile dysfunction)   . Hepatitis    hepatitis a age 7  . History of acute renal failure 12/2018   kidneys fully recovered no nephrologist per pt  . History of chest pain    01-06-2019 hospital admission-- dx acute renal failure, hypotensive with associated shock liver, SIRS with tachycardia/ tachyapnea,  chf diastolic  ;   [phone]  per pt no chest pain since and everything resolved , followed by pcp  . Hyperlipidemia   . Hypertension   . Prostate cancer [facility]) urologist-  dr wrenn/  oncologist-  dr Tammi Klippel   dx 07-02-2016,  T1c,  Gleason 3+3,  PSA 6.48,  48.44cc  . Splenic  infarct currently followed by pcp (previously dr Alvy Bimler -[facility])   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  anticoagulated w/ xarelto until june 2017 changed to asa 162mg  daily/   (06-30-2019 per pt now taking ASA 81mg  )  . Wears dentures    upper    Past Surgical History:  Procedure Laterality Date  . CYSTOSCOPY N/A 10/15/2016   Procedure: CYSTOSCOPY FLEXIBLE;  Surgeon: [name], MD;  [facility];  Service: Urology;  Laterality: N/A;  NO SEEDS FOUND IN BLADDER  . CYSTOSCOPY N/A 08/01/2020   Procedure: CYSTOSCOPY/removal of bladder stone and bladder neck biopsy;  Surgeon: [name], MD;  [facility]: [facility];  Service: Urology;  Laterality: N/A;  . IR FLUORO GUIDE CV LINE RIGHT  01/09/2019  . IR US GUIDE VASC ACCESS RIGHT  01/09/2019  . ORIF LEFT ANKLE  1980's  . PROSTATE BIOPSY    . RADIOACTIVE SEED IMPLANT N/A 10/15/2016   Procedure: RADIOACTIVE SEED IMPLANT/BRACHYTHERAPY IMPLANT;  Surgeon: [name], MD;  [facility];  Service: Urology;  Laterality: N/A;     66    SEEDS IMPLANTED  . REPAIR EXTENSOR TENDON Right 06/02/2016   Procedure: RIGHT LONG FINGER  EXTENSOR TENDON REPAIR;  Surgeon: Milly Jakob, MD;  Location: [place];  Service: Orthopedics;  Laterality: Right;  . TOTAL KNEE ARTHROPLASTY Right 2010  approx.  Marland Kitchen TOTAL KNEE ARTHROPLASTY Left 12/31/2020   Procedure: TOTAL KNEE ARTHROPLASTY;  Surgeon: [name], MD;  [facility];  Service: Orthopedics;  Laterality: Left;  . TRANSURETHRAL INCISION OF BLADDER NECK N/A 07/04/2019   Procedure: CYSTOSCOPY WITH INCISION OF BLADDER NECK CONTRACTURE, BALLOON DILITATION;  Surgeon: [name], MD;  Location: [facility];  Service: Urology;  Laterality: N/A;  . TRANSURETHRAL INCISION OF BLADDER NECK N/A 08/01/2020   Procedure: INCISION OF BLADDER NECK;  Surgeon: [name], MD;  Location: [facility];  Service: Urology;  Laterality: N/A;  1 HR  . TRANSURETHRAL  RESECTION OF PROSTATE N/A 06/16/2018   Procedure: TRANSURETHRAL RESECTION OF THE PROSTATE (TURP);  Surgeon: [name], MD;  [facility];  Service: Urology;  Laterality: N/A;    There were no vitals filed for this visit.   Subjective Assessment - 02/26/21 0937    Subjective I notice when I move it a certain way I have a sharp pain. I also have a click in the knee when I walk that started yesterday.    Currently in Pain? Yes    Pain Location Knee    Pain Orientation Right    Pain Descriptors / Indicators Aching;Sore    Pain Type Chronic pain    Aggravating Factors  turn it a certain way, over do it    Pain Relieving Factors exercise, ice                             [facility] Adult PT Treatment/Exercise - 02/26/21 0001      Knee/Hip Exercises: Aerobic   Recumbent Bike L2 x 6 min      Knee/Hip Exercises: Standing   Heel Raises 20 reps    Hip Abduction Stengthening;2 sets;10 reps;Knee straight    Abduction Limitations on foam oval    Forward Step Up --    Forward Step Up Limitations sharp pain with attempt to left step up with bil UE    Step Down --    Functional Squat 2 sets;10 reps    Functional Squat Limitations Counter support      Knee/Hip Exercises: Supine   Quad Sets 10 reps;Left    Bridges Limitations sharp pain with attempt    Straight Leg Raises Strengthening;2 sets;10 reps   combined with quad set and reset quad set between ea rep     Modalities   Modalities Cryotherapy      Cryotherapy   Number Minutes Cryotherapy 10 Minutes    Cryotherapy Location Knee    Type of Cryotherapy Ice pack      Manual Therapy   Soft tissue mobilization Manual STW anterior and posterio lateral left knee                    PT Short Term Goals - 02/10/21 0940      PT SHORT TERM GOAL #1   Title pt to be IND with inital HEP    Baseline no previous HEP    Time 4    Period Weeks    Status Achieved    Target Date 01/31/21      PT SHORT TERM GOAL #2    Title increase knee AROM total arc to >/= 10 - 90 degrees  Baseline see flowsheet    Time 4    Period Weeks    Status Achieved    Target Date 02/14/21      PT SHORT TERM GOAL #3   Title reduce max pain to </= 4/10 for functional and therapeutic progression    Baseline max pain 4 or less in posterior knee    Time 4    Period Weeks    Status Achieved    Target Date 01/31/21             PT Long Term Goals - 01/03/21 1032      PT LONG TERM GOAL #1   Title increase L knee total arc ROM to >/= 5 - 120 for functional ROM required for efficient gait and general mobility    Baseline see flow sheet    Time 8    Period Weeks    Status New    Target Date 02/28/21      PT LONG TERM GOAL #2   Title increase gross LLE strength to >/= 4+/5 to promote stability and safety with walking/ standing    Baseline unable to assess strength due to pain    Time 8    Period Weeks    Status New    Target Date 02/28/21      PT LONG TERM GOAL #3   Title pt to be abel to sit, stand and walk for >/= 45 min with LRAD for in home and short community distances with max pain of </= 2/10    Baseline sit 20 min, stand 5 min , walk 20 min    Time 8    Period Weeks    Status New    Target Date 02/28/21      PT LONG TERM GOAL #4   Title improve LEFS score to >/= 50/ 80 to demo improvement in function    Baseline inital score 12/80    Time 8    Status New    Target Date 02/28/21      PT LONG TERM GOAL #5   Title pt to be IND with all HEP given and is able to maintain and progress current ROM    Baseline no previous HEP    Time 8    Period Weeks    Status New    Target Date 02/28/21                 Plan - 02/26/21 1246    Clinical Impression Statement Pt reports he notes increased pain at lateral right knee today that started yesterday and now feels like his knee is clicking while he is walking. Continued with rec bike and open and closed chain strengthening. When he attempted a left  6 inch step up he had a sharp pain in his left lateral knee. He also had a sharp pain with attempts at bridging in the lateral left knee. Ice appied to knee post session and he was encouraged to ice frequently at home.    PT Next Visit Plan update HEP (Hx of CX no heat, estim, [place]) , how is his pain since last visit? still having the clicking and lateral knee pain?    PT Home Exercise Plan [id number]           Patient will benefit from skilled therapeutic intervention in order to improve the following deficits and impairments:  Improper body mechanics,Increased muscle spasms,Decreased strength,Abnormal gait,Pain,Decreased activity tolerance,Decreased endurance,Decreased range of motion,Increased edema  Visit Diagnosis:  Chronic pain of left knee  Localized edema  Stiffness of left knee, not elsewhere classified  Muscle weakness (generalized)     Problem List Patient Active Problem List   Diagnosis Date Noted  . S/P total knee arthroplasty, left 12/31/2020  . Acquired contracture of bladder neck 07/04/2019  . Hypertriglyceridemia 01/07/2019  . Chest pain 01/07/2019  . Chronic diastolic CHF (congestive heart failure) ([place]) 01/07/2019  . AKI (acute kidney injury) ([place]) 01/07/2019  . Shock liver 01/07/2019  . 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 Malignant neoplasm of prostate ([place]) 08/10/2016  . 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 Essential hypertension 11/26/2015  . 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 High anion gap metabolic acidosis 74/82/7078  . Substance abuse in remission [facility]) 11/26/2015    Dorene Ar, PTA 02/26/2021, 1:42 PM  [facility] [address] [place], [place], [address] Phone: [phone]   Fax:  [phone]  Name: Bradley Ray MRN: [id number] Date of Birth: 1956-09-18

## 2021-03-03 ENCOUNTER — Other Ambulatory Visit: Payer: Self-pay

## 2021-03-03 ENCOUNTER — Encounter: Payer: Self-pay | Admitting: Physical Therapy

## 2021-03-03 ENCOUNTER — Ambulatory Visit: Payer: Medicaid Other | Admitting: Physical Therapy

## 2021-03-03 DIAGNOSIS — M25662 Stiffness of left knee, not elsewhere classified: Secondary | ICD-10-CM

## 2021-03-03 DIAGNOSIS — M25562 Pain in left knee: Secondary | ICD-10-CM

## 2021-03-03 DIAGNOSIS — G8929 Other chronic pain: Secondary | ICD-10-CM

## 2021-03-03 DIAGNOSIS — R600 Localized edema: Secondary | ICD-10-CM

## 2021-03-03 NOTE — Therapy (Signed)
[place], [place], [address] Phone: [phone]   Fax:  [phone]  Physical Therapy Treatment / Re-certification  Patient Details  Name: Bradley Ray MRN: [id number] Date of Birth: [birth year] Referring Provider (PT): [name], MD   Encounter Date: 03/03/2021   PT End of Session - 03/03/21 0936    Visit Number 14    Number of Visits 20    Date for PT Re-Evaluation 04/14/21    Authorization Type [employer] managed MCD    Authorization - Visit Number 13    Authorization - Number of Visits 27    PT Start Time 0934    PT Stop Time 1015    PT Time Calculation (min) 41 min    Activity Tolerance Patient tolerated treatment well;Patient limited by pain    Behavior During Therapy [facility] for tasks assessed/performed           Past Medical History:  Diagnosis Date  . Alcoholism ([place])    per epic documentation long hx dependence--- (06-30-2019  per pt was drinking average 3 shots dialy until 03/ 2020, since then one beer weekly)  . Arthritis    knees  . Asthma   . Beta thalassemia trait    per hematologist/ oncologist note- dr Dorita Sciara--  per blood work-up  dx 02/ 2017  . Bladder neck contracture   . BPH with obstruction/lower urinary tract symptoms   . Chronic kidney disease    Hx: of Hemodyalysis  . Cocaine abuse, in remission ([place])    06-30-2019   per pt states last used 12/ 2017  . COVID 11/09/2019   loss of taste and smell and appetite x 14 days all symptoms resolved  . ED (erectile dysfunction)   . Hepatitis    hepatitis a age 74  . History of acute renal failure 12/2018   kidneys fully recovered no nephrologist per pt  . History of chest pain    01-06-2019 hospital admission-- dx acute renal failure, hypotensive with associated shock liver, SIRS with tachycardia/ tachyapnea,  chf diastolic  ;   [id number]  per pt no chest pain since and everything resolved , followed by pcp  . Hyperlipidemia    . Hypertension   . Prostate cancer [facility]) urologist-  dr wrenn/  oncologist-  dr Tammi Klippel   dx 07-02-2016,  T1c,  Gleason 3+3,  PSA 6.48,  48.44cc  . 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 (previously dr Alvy Bim[facility])   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  anticoagulated w/ xarelto until june 2017 changed to asa 167m daily/   (06-30-2019 per pt now taking ASA 830m)  . Wears dentures    upper    Past Surgical History:  Procedure Laterality Date  . CYSTOSCOPY N/A 10/15/2016   Procedure: CYSTOSCOPY FLEXIBLE;  Surgeon: JoIrine SealMD;  Location: [facility];  Service: Urology;  Laterality: N/A;  NO SEEDS FOUND IN BLADDER  . CYSTOSCOPY N/A 08/01/2020   Procedure: CYSTOSCOPY/removal of bladder stone and bladder neck biopsy;  Surgeon: WrIrine SealMD;  [facility] Service: [facility];  Laterality: N/A;  . IR FLUORO GUIDE CV LINE RIGHT  01/09/2019  . IR [place] VASC ACCESS RIGHT  01/09/2019  . ORIF LEFT ANKLE  1980's  . PROSTATE BIOPSY    . RADIOACTIVE SEED IMPLANT N/A 10/15/2016   Procedure: RADIOACTIVE SEED IMPLANT/BRACHYTHERAPY IMPLANT;  Surgeon: JoIrine SealMD;  Location: [facility];  Service: Urology;  Laterality:  N/A;     66    SEEDS IMPLANTED  . REPAIR EXTENSOR TENDON Right 06/02/2016   Procedure: RIGHT LONG FINGER EXTENSOR TENDON REPAIR;  Surgeon: Milly Jakob, MD;  Location: [place];  Service: Orthopedics;  Laterality: Right;  . TOTAL KNEE ARTHROPLASTY Right 2010  approx.  Marland Kitchen TOTAL KNEE ARTHROPLASTY Left 12/31/2020   Procedure: TOTAL KNEE ARTHROPLASTY;  Surgeon: [name], MD;  Location: [facility];  Service: Orthopedics;  Laterality: Left;  . TRANSURETHRAL INCISION OF BLADDER NECK N/A 07/04/2019   Procedure: CYSTOSCOPY WITH INCISION OF BLADDER NECK CONTRACTURE, BALLOON DILITATION;  Surgeon: [name], MD;  Location[facility];  Service: Urology;  Laterality: N/A;  . TRANSURETHRAL INCISION OF BLADDER NECK N/A 08/01/2020    Procedure: INCISION OF BLADDER NECK;  Surgeon: [name], MD;  Location: [facility];  Service: Urology;  Laterality: N/A;  1 HR  . TRANSURETHRAL RESECTION OF PROSTATE N/A 06/16/2018   Procedure: TRANSURETHRAL RESECTION OF THE PROSTATE (TURP);  Surgeon: [name], MD;  Location: [facility];  Service: Urology;  Laterality: N/A;    There were no vitals filed for this visit.   Subjective Assessment - 03/03/21 0937    Subjective " I have been working on the step up, I still have the click and pain in the L knee. I was getting out of a friends car and I think I twisted last wednesday and it hasn't improved any."    Currently in Pain? No/denies    Pain Score 0-No pain   at worst 10/10   Pain Location Knee    Pain Orientation Right    Pain Descriptors / Indicators --   uable to describe other than painfull and excruciating   Pain Onset More than a month ago    Aggravating Factors  stepping up with the L leg    Pain Relieving Factors N/A              [facility] PT Assessment - 03/03/21 0001      Assessment   Medical Diagnosis L TKA    Referring Provider (PT) [name], MD    Onset Date/Surgical Date 12/31/20      Observation/Other Assessments   Lower Extremity Functional Scale  53/80      AROM   Left Knee Extension 5    Left Knee Flexion 99      Strength   Right/Left Knee Right;Left    Left Knee Flexion 4/5    Left Knee Extension 5/5                         [facility] Adult PT Treatment/Exercise - 03/03/21 0001      Knee/Hip Exercises: Stretches   Passive Hamstring Stretch 2 reps;30 seconds      Knee/Hip Exercises: Standing   Step Down 2 sets;10 reps;Step Height: 6"   test, treat and retest.     Knee/Hip Exercises: Prone   Hamstring Curl 15 reps;2 sets   focus on eccentric loading     Manual Therapy   Manual therapy comments MTrP release L biceps femoris    Soft tissue mobilization IASTM along the biceps femoirs and tendon                     PT Short Term Goals - 03/03/21 0945      PT SHORT TERM GOAL #1   Title pt to be IND with inital HEP  Status Achieved      PT SHORT TERM GOAL #2   Title increase knee AROM total arc to >/= 10 - 90 degrees    Period Weeks    Status Achieved      PT SHORT TERM GOAL #3   Title reduce max pain to </= 4/10 for functional and therapeutic progression    Period Weeks    Status Achieved             PT Long Term Goals - 03/03/21 0945      PT LONG TERM GOAL #1   Title increase L knee total arc ROM to >/= 5 - 120 for functional ROM required for efficient gait and general mobility    Baseline able to get 5 degrees today but 99 degrees of flexion    Period Weeks    Status Partially Met      PT LONG TERM GOAL #2   Title increase gross LLE strength to >/= 4+/5 to promote stability and safety with walking/ standing    Period Weeks      PT LONG TERM GOAL #3   Title pt to be abel to sit, stand and walk for >/= 45 min with LRAD for in home and short community distances with max pain of </= 2/10    Period Weeks    Status Achieved      PT LONG TERM GOAL #4   Title improve LEFS score to >/= 50/ 80 to demo improvement in function    Baseline 53/80    Period Weeks    Status Achieved    Target Date 04/14/21      PT LONG TERM GOAL #5   Title pt to be IND with all HEP given and is able to maintain and progress current ROM    Baseline independent  with current HEP and progressing as able.    Period Weeks    Status On-going    Target Date 04/14/21                 Plan - 03/03/21 1010    Clinical Impression Statement Daniele arrives to treatment reporting pain in the posterolateral apsect of the L knee which started about a week ago getting into his friends car. TTP most notably along the distal biceps femoris. focused on pain relief with STW along the biceps femoris and eccentric loading to promote remodeling of the tendon. He contineus to make progress toward his  goals despite a reduction in knee ROM specifically flexion measuring 99 dgrees today which is secondary to pain. he would benefit from continued physical therapy to decrease L knee pain, improve ROM and maximize his function.    PT Frequency 1x / week    PT Duration 6 weeks    PT Treatment/Interventions ADLs/Self Care Home Management;Cryotherapy;Gait training;Stair training;Functional mobility training;Therapeutic activities;Therapeutic exercise;Balance training;Neuromuscular re-education;Patient/family education;Manual techniques;Passive range of motion;Scar mobilization;Taping;Vasopneumatic Device    PT Next Visit Plan update HEP (Hx of CX no heat, estim, [place]) , did he see MD, hows bicep femoris pain.  continue strengthening as able, improve knee flexion.    PT Home Exercise Plan [id number]    Consulted and Agree with Plan of Care Patient           Patient will benefit from skilled therapeutic intervention in order to improve the following deficits and impairments:  Improper body mechanics,Increased muscle spasms,Decreased strength,Abnormal gait,Pain,Decreased activity tolerance,Decreased endurance,Decreased range of motion,Increased edema  Visit Diagnosis: Chronic pain of left knee  Localized edema  Stiffness of left knee, not elsewhere classified     Problem List Patient Active Problem List   Diagnosis Date Noted  . S/P total knee arthroplasty, left 12/31/2020  . Acquired contracture of bladder neck 07/04/2019  . Hypertriglyceridemia 01/07/2019  . Chest pain 01/07/2019  . Chronic diastolic CHF (congestive heart failure) ([place]) 01/07/2019  . AKI (acute kidney injury) ([place]) 01/07/2019  . Shock liver 01/07/2019  . 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 Malignant neoplasm of prostate ([place]) 08/10/2016  . 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 Essential hypertension 11/26/2015  . 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 High anion gap metabolic acidosis 37/85/8850  . Substance abuse in  remission [facility]) 11/26/2015   Starr Lake PT, DPT, LAT, ATC  03/03/21  11:00 AM      [facility] [address] [place], [place], [address] Phone: [phone]   Fax:  [phone]  Name: Bradley Ray MRN: [id number] Date of Birth: 03-10-1956

## 2021-03-05 ENCOUNTER — Encounter: Payer: Self-pay | Admitting: Cardiology

## 2021-03-05 ENCOUNTER — Other Ambulatory Visit: Payer: Self-pay

## 2021-03-05 ENCOUNTER — Ambulatory Visit (INDEPENDENT_AMBULATORY_CARE_PROVIDER_SITE_OTHER): Payer: Medicaid Other | Admitting: Cardiology

## 2021-03-05 VITALS — BP 134/88 | HR 66 | Ht 66.0 in | Wt 174.0 lb

## 2021-03-05 DIAGNOSIS — R0600 Dyspnea, unspecified: Secondary | ICD-10-CM

## 2021-03-05 DIAGNOSIS — I2510 Atherosclerotic heart disease of native coronary artery without angina pectoris: Secondary | ICD-10-CM | POA: Diagnosis not present

## 2021-03-05 DIAGNOSIS — R0609 Other forms of dyspnea: Secondary | ICD-10-CM

## 2021-03-05 NOTE — Progress Notes (Signed)
Cardiology Office Note:    Date:  03/05/2021   ID:  Bradley Ray, DOB 01-Sep-1956, MRN [id number]  PCP:  Nolene Ebbs, MD   [facility] Providers Cardiologist:  Candee Furbish, MD     Referring MD: Nolene Ebbs, MD    History of Present Illness:    Bradley Ray is a 65 y.o. male here for follow up hypertension, hyperlipidemia, and CHF.   Total Left Knee Arthroplasty on 12/31/20. Tolerating well  On 01/01/21 CT Angio chest scan showed scattered atherosclerotic calcifications including coronary arteries. On atorvastatin 40 mg for his cholesterol.    Lab Results  Component Value Date   CHOL 111 01/02/2021   HDL 48 01/02/2021   LDLCALC 50 01/02/2021   TRIG 63 01/02/2021   CHOLHDL 2.3 01/02/2021    Family history of cancer and heart issues. Lost his father from smoking. Former smoker-11 years   On 01/05/21 he was in the hospital-noted having a rapid heart with occasional skipped beats.   Has some exertional shortness of breath. He has no exertional shortness of breath, chest pain,tightness, or pressure. He denies any PND, orthopnea, lightheadedness, syncopal episodes.    Past Medical History:  Diagnosis Date  . Alcoholism ([place])    per epic documentation long hx dependence--- (06-30-2019  per pt was drinking average 3 shots dialy until 03/ 2020, since then one beer weekly)  . Arthritis    knees  . Asthma   . Beta thalassemia trait    per hematologist/ oncologist note- dr Bradley Ray--  per blood work-up  dx 02/ 2017  . Bladder neck contracture   . BPH with obstruction/lower urinary tract symptoms   . Chronic kidney disease    Hx: of Hemodyalysis  . Cocaine abuse, in remission ([place])    06-30-2019   per pt states last used 12/ 2017  . COVID 11/09/2019   loss of taste and smell and appetite x 14 days all symptoms resolved  . ED (erectile dysfunction)   . Hepatitis    hepatitis a age 27  . History of acute renal failure 12/2018   kidneys fully recovered no  nephrologist per pt  . History of chest pain    01-06-2019 hospital admission-- dx acute renal failure, hypotensive with associated shock liver, SIRS with tachycardia/ tachyapnea,  chf diastolic  ;   [id number]  per pt no chest pain since and everything resolved , followed by pcp  . Hyperlipidemia   . Hypertension   . Prostate cancer [facility]) urologist-  dr Bradley Ray/  oncologist-  dr Bradley Ray   dx 07-02-2016,  T1c,  Gleason 3+3,  PSA 6.48,  48.44cc  . 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 (previously dr Bradley Ray [facility])   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  anticoagulated w/ xarelto until june 2017 changed to asa 162mg  daily/   (06-30-2019 per pt now taking ASA 81mg  )  . Wears dentures    upper    Past Surgical History:  Procedure Laterality Date  . CYSTOSCOPY N/A 10/15/2016   Procedure: CYSTOSCOPY FLEXIBLE;  Surgeon: Bradley Seal, MD;  Location: [facility];  Service: Urology;  Laterality: N/A;  NO SEEDS FOUND IN BLADDER  . CYSTOSCOPY N/A 08/01/2020   Procedure: CYSTOSCOPY/removal of bladder stone and bladder neck biopsy;  Surgeon: Bradley Seal, MD;  Location: [facility];  Service: Urology;  Laterality: N/A;  . IR FLUORO GUIDE CV LINE RIGHT  01/09/2019  . IR US GUIDE VASC ACCESS RIGHT  01/09/2019  .  ORIF LEFT ANKLE  1980's  . PROSTATE BIOPSY    . RADIOACTIVE SEED IMPLANT N/A 10/15/2016   Procedure: RADIOACTIVE SEED IMPLANT/BRACHYTHERAPY IMPLANT;  Surgeon: Bradley Seal, MD;  Location: [facility];  Service: Urology;  Laterality: N/A;     66    SEEDS IMPLANTED  . REPAIR EXTENSOR TENDON Right 06/02/2016   Procedure: RIGHT LONG FINGER EXTENSOR TENDON REPAIR;  Surgeon: Bradley Jakob, MD;  Location: [place];  Service: Orthopedics;  Laterality: Right;  . TOTAL KNEE ARTHROPLASTY Right 2010  approx.  Marland Kitchen TOTAL KNEE ARTHROPLASTY Left 12/31/2020   Procedure: TOTAL KNEE ARTHROPLASTY;  Surgeon: Bradley Butters, MD;  Location: [facility];  Service: Orthopedics;  Laterality: Left;  .  TRANSURETHRAL INCISION OF BLADDER NECK N/A 07/04/2019   Procedure: CYSTOSCOPY WITH INCISION OF BLADDER NECK CONTRACTURE, BALLOON DILITATION;  Surgeon: Bradley Seal, MD;  Location: [facility];  Service: Urology;  Laterality: N/A;  . TRANSURETHRAL INCISION OF BLADDER NECK N/A 08/01/2020   Procedure: INCISION OF BLADDER NECK;  Surgeon: Bradley Seal, MD;  Location: [facility];  Service: Urology;  Laterality: N/A;  1 HR  . TRANSURETHRAL RESECTION OF PROSTATE N/A 06/16/2018   Procedure: TRANSURETHRAL RESECTION OF THE PROSTATE (TURP);  Surgeon: Bradley Seal, MD;  Location: [facility];  Service: Urology;  Laterality: N/A;    Current Medications: Current Meds  Medication Sig  . albuterol (PROVENTIL HFA;VENTOLIN HFA) 108 (90 Base) MCG/ACT inhaler Inhale 2 puffs into the lungs every 6 (six) hours as needed for wheezing or shortness of breath.  Marland Kitchen aspirin EC 81 MG tablet Take 1 tablet (81 mg total) by mouth in the morning and at bedtime. To prevent blood clots after surgery  . atorvastatin (LIPITOR) 40 MG tablet Take 1 tablet (40 mg total) by mouth daily. (Patient taking differently: Take 40 mg by mouth daily.)  . folic acid (FOLVITE) 1 MG tablet Take 1 tablet (1 mg total) by mouth daily.  Marland Kitchen Ketotifen Fumarate (ALAWAY OP) Place 1 drop into both eyes daily as needed (itchy eyes).  . metoprolol succinate (TOPROL-XL) 50 MG 24 hr tablet Take 50 mg by mouth daily.  Marland Kitchen OVER THE COUNTER MEDICATION Apply 1 application topically daily as needed (pain). Hempvana cream  . thiamine (VITAMIN B-1) 100 MG tablet Take 100 mg by mouth daily.  . [DISCONTINUED] ondansetron (ZOFRAN) 4 MG tablet Take 1 tablet (4 mg total) by mouth daily as needed for nausea or vomiting.  . [DISCONTINUED] oxyCODONE (ROXICODONE) 5 MG immediate release tablet Take 1 tablet (5 mg total) by mouth every 6 (six) hours as needed for severe pain.     Allergies:   Morphine and related   Social History   Socioeconomic History  .  Marital status: Single    Spouse name: Not on file  . Number of children: Not on file  . Years of education: Not on file  . Highest education level: Not on file  Occupational History  . Not on file  Tobacco Use  . Smoking status: Former Smoker    Packs/day: 1.00    Years: 30.00    Pack years: 30.00    Types: Cigarettes    Quit date: 06/01/2006    Years since quitting: 14.7  . Smokeless tobacco: Never Used  Vaping Use  . Vaping Use: Never used  Substance and Sexual Activity  . Alcohol use: Yes    Comment: 1 beer fewdays ago  as of 07-23-2020  . Drug use: Yes  Types: "Crack" cocaine, Marijuana    Comment: per pt on 06-30-2019  states no cocaine since  2017/ last marijuana last used 07-25-2020  . Sexual activity: Yes  Other Topics Concern  . Not on file  Social History Narrative  . Not on file   Social Determinants of Health   Financial Resource Strain: Not on file  Food Insecurity: Not on file  Transportation Needs: Not on file  Physical Activity: Not on file  Stress: Not on file  Social Connections: Not on file     Family History: The patient's family history includes Arrhythmia in his mother; Cancer in his maternal aunt, maternal uncle, sister, and another family member; Clotting disorder in his sister; Pancreatic cancer in his sister; Sickle cell trait in his son; Throat cancer in his father.  ROS:   Please see the history of present illness. All other systems reviewed and are negative.  EKGs/Labs/Other Studies Reviewed:    The following studies were reviewed today: Chest X-ray 01/01/21- The heart size and mediastinal contours are within normal limits. Both lungs are clear. No pneumothorax or pleural effusion is noted. Elevated right hemidiaphragm is noted. The visualized skeletal structures are unremarkable.  CT Angio Chest 01/01/21-  No evidence of pulmonary embolism. Dependent atelectasis in the posterior lower lobes bilaterally. Scattered atherosclerotic  calcifications including coronary arteries.  Echo 01/01/21- 1. Left ventricular ejection fraction, by estimation, is 60 to 65%. The left ventricle has normal function. The left ventricle has no regional wall motion abnormalities. Left ventricular diastolic parameters are indeterminate. 2. Right ventricular systolic function is normal. The right ventricular size is normal. Tricuspid regurgitation signal is inadequate for assessing PA pressure. 3. The mitral valve is normal in structure. No evidence of mitral valve regurgitation. 4. The aortic valve is tricuspid. Aortic valve regurgitation is not visualized. No aortic stenosis is present. 5. The inferior vena cava is normal in size with greater than 50% respiratory variability, suggesting right atrial pressure of 3 mmHg.  Echo 01/07/19- 1. The left ventricle has normal systolic function, with an ejection  fraction of 55-60%. The cavity size was mildly dilated. Left ventricular  diastolic Doppler parameters are consistent with impaired relaxation.  2. The right ventricle has normal systolc function. The cavity was  normal. There is no increase in right ventricular wall thickness.  3. No evidence present in the left atrial appendage.  4. There is mild to moderate mitral annular calcification present.  5. The aortic valve is tricuspid Mild sclerosis of the aortic valve.  Aortic valve regurgitation was not assessed by color flow Doppler.  6. No pulmonic valve vegetation visualized.  7. The interatrial septum appears to be lipomatous.   EKG:   03/05/21-  Recent Labs: 01/01/2021: B Natriuretic Peptide 86.1; BUN 6; Creatinine, Ser 0.74; Hemoglobin 10.8; Magnesium 1.5; Platelets 161; Potassium 3.2; Sodium 136  Recent Lipid Panel    Component Value Date/Time   CHOL 111 01/02/2021 0359   TRIG 63 01/02/2021 0359   HDL 48 01/02/2021 0359   CHOLHDL 2.3 01/02/2021 0359   VLDL 13 01/02/2021 0359   LDLCALC 50 01/02/2021 0359     Risk  Assessment/Calculations:      Physical Exam:    VS:  BP 134/88   Pulse 66   Ht 5\' 6"  (1.676 m)   Wt 174 lb (78.9 kg)   SpO2 98%   BMI 28.08 kg/m     Wt Readings from Last 3 Encounters:  03/05/21 174 lb (78.9 kg)  12/31/20  178 lb (80.7 kg)  12/25/20 178 lb (80.7 kg)     GEN: Well nourished, well developed in no acute distress HEENT: Normal NECK: No JVD; No carotid bruits LYMPHATICS: No lymphadenopathy CARDIAC: RRR, no murmurs, rubs, gallops RESPIRATORY:  Clear to auscultation without rales, wheezing or rhonchi  ABDOMEN: Soft, non-tender, non-distended MUSCULOSKELETAL:  No edema; No deformity  SKIN: Warm and dry NEUROLOGIC:  Alert and oriented x 3 PSYCHIATRIC:  Normal affect   ASSESSMENT:    1. DOE (dyspnea on exertion)   2. CAD in native artery    PLAN:    In order of problems listed above:  Coronary artery atherosclerosis, coronary artery calcification, aortic atherosclerosis - Currently on aspirin 81 mg, atorvastatin 40 mg high intensity statin dose with LDL cholesterol of 50 from outside labs.  Excellent.  Creatinine 0.7 ALT 80 in 2020, slightly elevated.  Continue to monitor.  Hemoglobin 10.8. -Given his concomitant dyspnea on exertion, we will go ahead and check a Lexiscan stress test to exclude any evidence of high risk ischemia. - Personally reviewed and interpreted CT scan showing diffuse, dense LAD, circumflex, RCA calcification.  There was also aortic atherosclerosis present.  PVCs - Noted on prior EKG in the hospital setting.  Personally reviewed and interpreted.  Continue with Toprol.  Doing well currently.  Former smoker - Quit over 10 years ago.  Smoked for 30 years.  Essential hypertension - Currently reasonably controlled.  Next step may be to add angiotensin receptor blocker such as irbesartan.  Knee arthroplasty, doing well.  We will follow-up with results of stress test.  83-month follow-up with APP, 1 year with me.    Shared Decision  Making/Informed Consent The risks [chest pain, shortness of breath, cardiac arrhythmias, dizziness, blood pressure fluctuations, myocardial infarction, stroke/transient ischemic attack, nausea, vomiting, allergic reaction, radiation exposure, metallic taste sensation and life-threatening complications (estimated to be 1 in 10,000)], benefits (risk stratification, diagnosing coronary artery disease, treatment guidance) and alternatives of a nuclear stress test were discussed in detail with Mr. Wion and he agrees to proceed.       Medication Adjustments/Labs and Tests Ordered: Current medicines are reviewed at length with the patient today.  Concerns regarding medicines are outlined above.  Orders Placed This Encounter  Procedures  . MYOCARDIAL PERFUSION IMAGING   No orders of the defined types were placed in this encounter.   Patient Instructions  Medication Instructions:  Your provider recommends that you continue on your current medications as directed. Please refer to the Current Medication list given to you today.   *If you need a refill on your cardiac medications before your next appointment, please call your pharmacy*  Testing/Procedures: Your provider has requested that you have a lexiscan myoview. For further information please visit [email or web address]. Please follow instruction sheet, as given.  Follow-Up: At [facility], you and your health needs are our priority.  As part of our continuing mission to provide you with exceptional heart care, we have created designated Provider Care Teams.  These Care Teams include your primary Cardiologist (physician) and Advanced Practice Providers (APPs -  Physician Assistants and Nurse Practitioners) who all work together to provide you with the care you need, when you need it. Your next appointment:   6 month(s) The format for your next appointment:   In Person Provider:   You will see one of the following Advanced Practice  Providers on your designated Care Team:    Kathyrn Drown, NP  Then, Dr. Marlou Porch will plan  to see you again in 12 month(s).      I,Stephanie Williams,acting as a Education administrator for [employer], MD.,have documented all relevant documentation on the behalf of Candee Furbish, MD,as directed by  Candee Furbish, MD while in the presence of Candee Furbish, MD.  I, Candee Furbish, MD, have reviewed all documentation for this visit. The documentation on 03/05/21 for the exam, diagnosis, procedures, and orders are all accurate and complete.   Signed, Candee Furbish, MD  03/05/2021 11:35 AM    [facility]

## 2021-03-05 NOTE — Patient Instructions (Signed)
Medication Instructions:  Your provider recommends that you continue on your current medications as directed. Please refer to the Current Medication list given to you today.   *If you need a refill on your cardiac medications before your next appointment, please call your pharmacy*  Testing/Procedures: Your provider has requested that you have a lexiscan myoview. For further information please visit [email or web address]. Please follow instruction sheet, as given.  Follow-Up: At [facility], you and your health needs are our priority.  As part of our continuing mission to provide you with exceptional heart care, we have created designated Provider Care Teams.  These Care Teams include your primary Cardiologist (physician) and Advanced Practice Providers (APPs -  Physician Assistants and Nurse Practitioners) who all work together to provide you with the care you need, when you need it. Your next appointment:   6 month(s) The format for your next appointment:   In Person Provider:   You will see one of the following Advanced Practice Providers on your designated Care Team:    Kathyrn Drown, NP  Then, Dr. Marlou Porch will plan to see you again in 12 month(s).

## 2021-03-06 ENCOUNTER — Ambulatory Visit: Payer: Medicaid Other | Admitting: Physical Therapy

## 2021-03-06 ENCOUNTER — Encounter: Payer: Self-pay | Admitting: Physical Therapy

## 2021-03-06 DIAGNOSIS — M25662 Stiffness of left knee, not elsewhere classified: Secondary | ICD-10-CM

## 2021-03-06 DIAGNOSIS — G8929 Other chronic pain: Secondary | ICD-10-CM

## 2021-03-06 DIAGNOSIS — M6281 Muscle weakness (generalized): Secondary | ICD-10-CM

## 2021-03-06 DIAGNOSIS — R600 Localized edema: Secondary | ICD-10-CM

## 2021-03-06 DIAGNOSIS — M25562 Pain in left knee: Secondary | ICD-10-CM | POA: Diagnosis not present

## 2021-03-06 NOTE — Therapy (Signed)
[place], [place], [address] Phone: [phone]   Fax:  [phone]  Physical Therapy Treatment  Patient Details  Name: Bradley Ray MRN: [id number] Date of Birth: 1956-07-17 Referring Provider (PT): [name], MD   Encounter Date: 03/06/2021   PT End of Session - 03/06/21 0938    Visit Number 15    Number of Visits 20    Date for PT Re-Evaluation 04/14/21    Authorization Type U[employer] managed MCD    Authorization - Visit Number 14    Authorization - Number of Visits 27    PT Start Time [phone]    PT Stop Time 1015    PT Time Calculation (min) 39 min    Activity Tolerance Patient tolerated treatment well;Patient limited by pain    Behavior During Therapy [facility] for tasks assessed/performed           Past Medical History:  Diagnosis Date  . Alcoholism ([place])    per epic documentation long hx dependence--- (06-30-2019  per pt was drinking average 3 shots dialy until 03/ 2020, since then one beer weekly)  . Arthritis    knees  . Asthma   . Beta thalassemia trait    per hematologist/ oncologist note- dr Dorita Sciara--  per blood work-up  dx 02/ 2017  . Bladder neck contracture   . BPH with obstruction/lower urinary tract symptoms   . Chronic kidney disease    Hx: of Hemodyalysis  . Cocaine abuse, in remission ([place])    06-30-2019   per pt states last used 12/ 2017  . COVID 11/09/2019   loss of taste and smell and appetite x 14 days all symptoms resolved  . ED (erectile dysfunction)   . Hepatitis    hepatitis a age 65  . History of acute renal failure 12/2018   kidneys fully recovered no nephrologist per pt  . History of chest pain    01-06-2019 hospital admission-- dx acute renal failure, hypotensive with associated shock liver, SIRS with tachycardia/ tachyapnea,  chf diastolic  ;   [phone]  per pt no chest pain since and everything resolved , followed by pcp  . Hyperlipidemia   . Hypertension    . Prostate cancer [facility]) urologist-  dr wrenn/  oncologist-  dr Tammi Klippel   dx 07-02-2016,  T1c,  Gleason 3+3,  PSA 6.48,  48.44cc  . 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 (previously dr Al[facility] B[facility])   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  anticoagulated w/ xarelto until june 2017 changed to asa 125m daily/   (06-30-2019 per pt now taking ASA 86m)  . Wears dentures    upper    Past Surgical History:  Procedure Laterality Date  . CYSTOSCOPY N/A 10/15/2016   Procedure: CYSTOSCOPY FLEXIBLE;  Surgeon: JoIrine SealMD;  [facility];  Service: Urology;  Laterality: N/A;  NO SEEDS FOUND IN BLADDER  . CYSTOSCOPY N/A 08/01/2020   Procedure: CYSTOSCOPY/removal of bladder stone and bladder neck biopsy;  Surgeon: WrIrine SealMD;  [facility] Service: Urology;  Laterality: N/A;  . IR FLUORO GUIDE CV LINE RIGHT  01/09/2019  . IR [place] VASC ACCESS RIGHT  01/09/2019  . ORIF LEFT ANKLE  1980's  . PROSTATE BIOPSY    . RADIOACTIVE SEED IMPLANT N/A 10/15/2016   Procedure: RADIOACTIVE SEED IMPLANT/BRACHYTHERAPY IMPLANT;  Surgeon: JoIrine SealMD;  [facility];  Service: Urology;  Laterality: N/A;  [place]  . REPAIR EXTENSOR TENDON Right 06/02/2016   Procedure: RIGHT LONG FINGER EXTENSOR TENDON REPAIR;  Surgeon: Milly Jakob, MD;  Location: [place];  Service: Orthopedics;  Laterality: Right;  . TOTAL KNEE ARTHROPLASTY Right 2010  approx.  Marland Kitchen TOTAL KNEE ARTHROPLASTY Left 12/31/2020   Procedure: TOTAL KNEE ARTHROPLASTY;  Surgeon: [name], MD;  [facility];  Service: Orthopedics;  Laterality: Left;  . TRANSURETHRAL INCISION OF BLADDER NECK N/A 07/04/2019   Procedure: CYSTOSCOPY WITH INCISION OF BLADDER NECK CONTRACTURE, BALLOON DILITATION;  Surgeon: [name], MD;  [facility];  Service: Urology;  Laterality: N/A;  . TRANSURETHRAL INCISION OF BLADDER NECK N/A 08/01/2020   Procedure: INCISION OF  BLADDER NECK;  Surgeon: [name], MD;  Location: [facility];  Service: Urology;  Laterality: N/A;  1 HR  . TRANSURETHRAL RESECTION OF PROSTATE N/A 06/16/2018   Procedure: TRANSURETHRAL RESECTION OF THE PROSTATE (TURP);  Surgeon: [name], MD;  [facility];  Service: Urology;  Laterality: N/A;    There were no vitals filed for this visit.   Subjective Assessment - 03/06/21 0939    Subjective " I am feeling pretty good, only alittle stiffness inthe knee. I don't know what  was done Monday but I am feeling much better."    Patient Stated Goals get back to mowing the lawn, get back to hitting the dance floor.    Currently in Pain? Yes    Pain Score 0-No pain    Pain Orientation Right    Pain Descriptors / Indicators Aching    Pain Type Chronic pain    Pain Onset More than a month ago    Pain Frequency Intermittent              [facility] PT Assessment - 03/06/21 0001      Assessment   Medical Diagnosis L TKA    Referring Provider (PT) [name], MD    Onset Date/Surgical Date 12/31/20      AROM   Left Knee Extension 8    Left Knee Flexion 111                         [facility] Adult PT Treatment/Exercise - 03/06/21 0001      Knee/Hip Exercises: Stretches   Passive Hamstring Stretch 1 rep;Left   low load long durationg with foot propped in chair using 4#     Knee/Hip Exercises: Aerobic   Recumbent Bike L4 x 5 min      Knee/Hip Exercises: Machines for Strengthening   Cybex Knee Flexion 2 x 10 con bil/ ecc LLE only 15#      Knee/Hip Exercises: Standing   Forward Step Up 2 sets;Step Height: 6";15 reps      Manual Therapy   Manual Therapy Other (comment)    Manual therapy comments MTrP release L biceps femoris    Other Manual Therapy tack and stretch of the L hamstring   using tennis ball                   PT Short Term Goals - 03/03/21 0945      PT SHORT TERM GOAL #1   Title pt to be IND with inital HEP    Status  Achieved      PT SHORT TERM GOAL #2   Title increase knee AROM total arc to >/= 10 - 90  degrees    Period Weeks    Status Achieved      PT SHORT TERM GOAL #3   Title reduce max pain to </= 4/10 for functional and therapeutic progression    Period Weeks    Status Achieved             PT Long Term Goals - 03/03/21 0945      PT LONG TERM GOAL #1   Title increase L knee total arc ROM to >/= 5 - 120 for functional ROM required for efficient gait and general mobility    Baseline able to get 5 degrees today but 99 degrees of flexion    Period Weeks    Status Partially Met      PT LONG TERM GOAL #2   Title increase gross LLE strength to >/= 4+/5 to promote stability and safety with walking/ standing    Period Weeks      PT LONG TERM GOAL #3   Title pt to be abel to sit, stand and walk for >/= 45 min with LRAD for in home and short community distances with max pain of </= 2/10    Period Weeks    Status Achieved      PT LONG TERM GOAL #4   Title improve LEFS score to >/= 50/ 80 to demo improvement in function    Baseline 53/80    Period Weeks    Status Achieved    Target Date 04/14/21      PT LONG TERM GOAL #5   Title pt to be IND with all HEP given and is able to maintain and progress current ROM    Baseline independent  with current HEP and progressing as able.    Period Weeks    Status On-going    Target Date 04/14/21                 Plan - 03/06/21 1011    Clinical Impression Statement Canuto reports significant improvement in pain since the previous session. He demonstrates improvement in knee flexiont today at 111 degrees but does have more of an extension lag today at 8 degrees today. Continued working on addressing hamstring stretching and strengthening which he reporting feeling better, and improved with his step up activity. plan to see pt 1 x a week for 4 weeks. to work on addressing knee ROM and strength and work to finalize his HEP.    PT Next Visit Plan  update HEP (Hx of CX no heat, estim, [place]) ,, hows bicep femoris pain.  continue strengthening as able, improve knee flexion.    PT Home Exercise Plan [id number]    Consulted and Agree with Plan of Care Patient           Patient will benefit from skilled therapeutic intervention in order to improve the following deficits and impairments:  Improper body mechanics,Increased muscle spasms,Decreased strength,Abnormal gait,Pain,Decreased activity tolerance,Decreased endurance,Decreased range of motion,Increased edema  Visit Diagnosis: Chronic pain of left knee  Localized edema  Stiffness of left knee, not elsewhere classified  Muscle weakness (generalized)     Problem List Patient Active Problem List   Diagnosis Date Noted  . S/P total knee arthroplasty, left 12/31/2020  . Acquired contracture of bladder neck 07/04/2019  . Hypertriglyceridemia 01/07/2019  . Chest pain 01/07/2019  . Chronic diastolic CHF (congestive heart failure) ([place]) 01/07/2019  . AKI (acute kidney injury) ([place]) 01/07/2019  . Shock liver 01/07/2019  . 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  Malignant neoplasm of prostate ([place]) 08/10/2016  . 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 Essential hypertension 11/26/2015  . 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 High anion gap metabolic acidosis 35/52/1747  . Substance abuse in remission [facility]) 11/26/2015    Starr Lake PT, DPT, LAT, ATC  03/06/21  10:21 AM      [facility] [address] [place], [place], [address] Phone: [phone]   Fax:  [phone]  Name: DAUNE COLGATE MRN: [id number] Date of Birth: 21-Jun-1956

## 2021-03-13 ENCOUNTER — Encounter: Payer: Self-pay | Admitting: Physical Therapy

## 2021-03-13 ENCOUNTER — Other Ambulatory Visit: Payer: Self-pay

## 2021-03-13 ENCOUNTER — Ambulatory Visit: Payer: Medicaid Other | Admitting: Physical Therapy

## 2021-03-13 ENCOUNTER — Ambulatory Visit: Payer: Medicaid Other | Attending: Orthopedic Surgery | Admitting: Physical Therapy

## 2021-03-13 DIAGNOSIS — R600 Localized edema: Secondary | ICD-10-CM | POA: Diagnosis present

## 2021-03-13 DIAGNOSIS — M6281 Muscle weakness (generalized): Secondary | ICD-10-CM | POA: Diagnosis present

## 2021-03-13 DIAGNOSIS — M25562 Pain in left knee: Secondary | ICD-10-CM | POA: Insufficient documentation

## 2021-03-13 DIAGNOSIS — R2689 Other abnormalities of gait and mobility: Secondary | ICD-10-CM | POA: Diagnosis present

## 2021-03-13 DIAGNOSIS — M25662 Stiffness of left knee, not elsewhere classified: Secondary | ICD-10-CM

## 2021-03-13 DIAGNOSIS — G8929 Other chronic pain: Secondary | ICD-10-CM | POA: Diagnosis present

## 2021-03-13 DIAGNOSIS — Z96652 Presence of left artificial knee joint: Secondary | ICD-10-CM

## 2021-03-13 NOTE — Therapy (Signed)
[place], [place], [address] Phone: [phone]   Fax:  [phone]  Physical Therapy Treatment  Patient Details  Name: Bradley Ray MRN: [id number] Date of Birth: Jun 25, 1956 Referring Provider (PT): [name], MD   Encounter Date: 03/13/2021   PT End of Session - 03/13/21 1328    Visit Number 16    Number of Visits 20    Date for PT Re-Evaluation 04/14/21    Authorization Type UHC managed MCD    Authorization - Visit Number 15    PT Start Time 7628    PT Stop Time 1408    PT Time Calculation (min) 41 min    Activity Tolerance Patient tolerated treatment well;Patient limited by pain    Behavior During Therapy [facility] for tasks assessed/performed           Past Medical History:  Diagnosis Date  . Alcoholism ([place])    per epic documentation long hx dependence--- (06-30-2019  per pt was drinking average 3 shots dialy until 03/ 2020, since then one beer weekly)  . Arthritis    knees  . Asthma   . Beta thalassemia trait    per hematologist/ oncologist note- dr Dorita Sciara--  per blood work-up  dx 02/ 2017  . Bladder neck contracture   . BPH with obstruction/lower urinary tract symptoms   . Chronic kidney disease    Hx: of Hemodyalysis  . Cocaine abuse, in remission ([place])    06-30-2019   per pt states last used 12/ 2017  . COVID 11/09/2019   loss of taste and smell and appetite x 14 days all symptoms resolved  . ED (erectile dysfunction)   . Hepatitis    hepatitis a age 65  . History of acute renal failure 12/2018   kidneys fully recovered no nephrologist per pt  . History of chest pain    01-06-2019 hospital admission-- dx acute renal failure, hypotensive with associated shock liver, SIRS with tachycardia/ tachyapnea,  chf diastolic  ;   [phone]  per pt no chest pain since and everything resolved , followed by pcp  . Hyperlipidemia   . Hypertension   . 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 (previously dr Alvy B[facility]ler -[facility])   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  anticoagulated w/ xarelto until june 2017 changed to asa 171m daily/   (06-30-2019 per pt now taking ASA 882m)  . Wears dentures    upper    Past Surgical History:  Procedure Laterality Date  . CYSTOSCOPY N/A 10/15/2016   Procedure: CYSTOSCOPY FLEXIBLE;  Surgeon: JoIrine SealMD;  [facility];  Service: Urology;  Laterality: N/A;  NO SEEDS FOUND IN BLADDER  . CYSTOSCOPY N/A 08/01/2020   Procedure: CYSTOSCOPY/removal of bladder stone and bladder neck biopsy;  Surgeon: WrIrine SealMD;  [facility] Service: U[facility];  Laterality: N/A;  . IR FLUORO GUIDE CV LINE RIGHT  01/09/2019  . IR [place]IDE VASC ACCESS RIGHT  01/09/2019  . ORIF LEFT ANKLE  1980's  . PROSTATE BIOPSY    . RADIOACTIVE SEED IMPLANT N/A 10/15/2016   Procedure: RADIOACTIVE SEED IMPLANT/BRACHYTHERAPY IMPLANT;  Surgeon: JoIrine SealMD;  [facility];  Service: Urology;  Laterality: N/A;     66    SEEDS IMPLANTED  .  REPAIR EXTENSOR TENDON Right 06/02/2016   Procedure: RIGHT LONG FINGER EXTENSOR TENDON REPAIR;  Surgeon: Milly Jakob, MD;  Location: [place];  Service: Orthopedics;  Laterality: Right;  . TOTAL KNEE ARTHROPLASTY Right 2010  approx.  Marland Kitchen TOTAL KNEE ARTHROPLASTY Left 12/31/2020   Procedure: TOTAL KNEE ARTHROPLASTY;  Surgeon: [name], MD;  [facility];  Service: Orthopedics;  Laterality: Left;  . TRANSURETHRAL INCISION OF BLADDER NECK N/A 07/04/2019   Procedure: CYSTOSCOPY WITH INCISION OF BLADDER NECK CONTRACTURE, BALLOON DILITATION;  Surgeon: [name], MD;  Location: [facility];  Service: Urology;  Laterality: N/A;  . TRANSURETHRAL INCISION OF BLADDER NECK N/A 08/01/2020   Procedure: INCISION OF BLADDER NECK;  Surgeon: [name], MD;   Location: [facility];  Service: Urology;  Laterality: N/A;  1 HR  . TRANSURETHRAL RESECTION OF PROSTATE N/A 06/16/2018   Procedure: TRANSURETHRAL RESECTION OF THE PROSTATE (TURP);  Surgeon: [name], MD;  [facility];  Service: Urology;  Laterality: N/A;    There were no vitals filed for this visit.   Subjective Assessment - 03/13/21 1328    Subjective "I am doing pretty good today."    Patient Stated Goals get back to mowing the lawn, get back to hitting the dance floor.    Currently in Pain? No/denies    Aggravating Factors  N/A              [facility] PT Assessment - 03/13/21 0001      Assessment   Medical Diagnosis L TKA    Referring Provider (PT) [name], MD    Onset Date/Surgical Date 12/31/20                         [facility] Adult PT Treatment/Exercise - 03/13/21 0001      Knee/Hip Exercises: Stretches   Active Hamstring Stretch 2 reps;Left;30 seconds   standing   Quad Stretch Left;2 reps;30 seconds   standing, holding onto chair with L foot in chair   Gastroc Stretch 2 reps;30 seconds      Knee/Hip Exercises: Aerobic   Elliptical L1 x 6 min Ramp L 5      Knee/Hip Exercises: Machines for Strengthening   Cybex Knee Extension 2 x 20 LLE only 10#    Cybex Knee Flexion 2 x 20 LLE 15#      Knee/Hip Exercises: Standing   Forward Step Up 2 sets;Step Height: 8";Left    Functional Squat 2 sets;15 reps               Balance Exercises - 03/13/21 0001      Balance Exercises: Standing   SLS 4 reps;30 secs;Solid surface;Eyes open   intermittent HHA              PT Short Term Goals - 03/03/21 0945      PT SHORT TERM GOAL #1   Title pt to be IND with inital HEP    Status Achieved      PT SHORT TERM GOAL #2   Title increase knee AROM total arc to >/= 10 - 90 degrees    Period Weeks    Status Achieved      PT SHORT TERM GOAL #3   Title reduce max pain to </= 4/10 for functional and therapeutic progression     Period Weeks    Status Achieved  PT Long Term Goals - 03/03/21 0945      PT LONG TERM GOAL #1   Title increase L knee total arc ROM to >/= 5 - 120 for functional ROM required for efficient gait and general mobility    Baseline able to get 5 degrees today but 99 degrees of flexion    Period Weeks    Status Partially Met      PT LONG TERM GOAL #2   Title increase gross LLE strength to >/= 4+/5 to promote stability and safety with walking/ standing    Period Weeks      PT LONG TERM GOAL #3   Title pt to be abel to sit, stand and walk for >/= 45 min with LRAD for in home and short community distances with max pain of </= 2/10    Period Weeks    Status Achieved      PT LONG TERM GOAL #4   Title improve LEFS score to >/= 50/ 80 to demo improvement in function    Baseline 53/80    Period Weeks    Status Achieved    Target Date 04/14/21      PT LONG TERM GOAL #5   Title pt to be IND with all HEP given and is able to maintain and progress current ROM    Baseline independent  with current HEP and progressing as able.    Period Weeks    Status On-going    Target Date 04/14/21                 Plan - 03/13/21 1345    Clinical Impression Statement pt reports no pain or issues today. focused session on gross hip/ knee strengtheing with increased reps to promote strength as well as endurance. He did well with all exercises with on report of pain or issues. Worked on balance which he demosntrated more difficulty with exhibiting postural swy    PT Treatment/Interventions ADLs/Self Care Home Management;Cryotherapy;Gait training;Stair training;Functional mobility training;Therapeutic activities;Therapeutic exercise;Balance training;Neuromuscular re-education;Patient/family education;Manual techniques;Passive range of motion;Scar mobilization;Taping;Vasopneumatic Device    PT Next Visit Plan update HEP (Hx of CX no heat, estim, [place]) ,continue strengthening as able, improve  knee flexion., balance training,    PT Home Exercise Plan [phone]           Patient will benefit from skilled therapeutic intervention in order to improve the following deficits and impairments:  Improper body mechanics,Increased muscle spasms,Decreased strength,Abnormal gait,Pain,Decreased activity tolerance,Decreased endurance,Decreased range of motion,Increased edema  Visit Diagnosis: Chronic pain of left knee  Localized edema  Stiffness of left knee, not elsewhere classified  Muscle weakness (generalized)  Other abnormalities of gait and mobility  Total knee replacement status, left     Problem List Patient Active Problem List   Diagnosis Date Noted  . S/P total knee arthroplasty, left 12/31/2020  . Acquired contracture of bladder neck 07/04/2019  . Hypertriglyceridemia 01/07/2019  . Chest pain 01/07/2019  . Chronic diastolic CHF (congestive heart failure) ([place]) 01/07/2019  . AKI (acute kidney injury) ([place]) 01/07/2019  . Shock liver 01/07/2019  . 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 Malignant neoplasm of prostate ([place]) 08/10/2016  . 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 Essential hypertension 11/26/2015  . 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 High anion gap metabolic acidosis 32/35/5732  . Substance abuse in remission ([place]) 11/26/2015   Starr Lake PT, DPT, LAT, ATC  03/13/21  2:10 PM      [facility] [address]  [place], [place], [address] Phone: [phone]   Fax:  [phone]  Name: LEM PEARY MRN: [id number] Date of Birth: 1956-04-01

## 2021-03-18 ENCOUNTER — Encounter (HOSPITAL_COMMUNITY): Payer: Self-pay | Admitting: *Deleted

## 2021-03-20 ENCOUNTER — Ambulatory Visit: Payer: Medicaid Other | Admitting: Physical Therapy

## 2021-03-21 ENCOUNTER — Other Ambulatory Visit: Payer: Self-pay

## 2021-03-21 ENCOUNTER — Ambulatory Visit (HOSPITAL_COMMUNITY): Payer: Medicaid Other | Attending: Cardiology

## 2021-03-21 DIAGNOSIS — I2510 Atherosclerotic heart disease of native coronary artery without angina pectoris: Secondary | ICD-10-CM

## 2021-03-21 DIAGNOSIS — R0600 Dyspnea, unspecified: Secondary | ICD-10-CM | POA: Diagnosis present

## 2021-03-21 DIAGNOSIS — R0609 Other forms of dyspnea: Secondary | ICD-10-CM

## 2021-03-21 LAB — MYOCARDIAL PERFUSION IMAGING
LV dias vol: 161 mL (ref 62–150)
LV sys vol: 93 mL
Peak HR: 96 {beats}/min
Rest HR: 69 {beats}/min
SDS: 0
SRS: 0
SSS: 0
TID: 1.06

## 2021-03-21 MED ORDER — TECHNETIUM TC 99M TETROFOSMIN IV KIT
32.6000 | PACK | Freq: Once | INTRAVENOUS | Status: AC | PRN
  Administered 2021-03-21: 32.6 via INTRAVENOUS
  Filled 2021-03-21: qty 33

## 2021-03-21 MED ORDER — REGADENOSON 0.4 MG/5ML IV SOLN
0.4000 mg | Freq: Once | INTRAVENOUS | Status: AC
Start: 2021-03-21 — End: 2021-03-21
  Administered 2021-03-21: 0.4 mg via INTRAVENOUS

## 2021-03-21 MED ORDER — TECHNETIUM TC 99M TETROFOSMIN IV KIT
10.1000 | PACK | Freq: Once | INTRAVENOUS | Status: AC | PRN
  Administered 2021-03-21: 10.1 via INTRAVENOUS
  Filled 2021-03-21: qty 11

## 2021-03-25 ENCOUNTER — Telehealth: Payer: Self-pay | Admitting: Cardiology

## 2021-03-25 NOTE — Telephone Encounter (Signed)
Informed pt of results. Pt verbalized understanding. 

## 2021-03-25 NOTE — Telephone Encounter (Signed)
Patient is calling to receive his results. Please call back

## 2021-03-27 ENCOUNTER — Ambulatory Visit: Payer: Medicaid Other | Admitting: Physical Therapy

## 2021-03-27 ENCOUNTER — Other Ambulatory Visit: Payer: Self-pay

## 2021-03-27 ENCOUNTER — Encounter: Payer: Self-pay | Admitting: Physical Therapy

## 2021-03-27 DIAGNOSIS — R2689 Other abnormalities of gait and mobility: Secondary | ICD-10-CM

## 2021-03-27 DIAGNOSIS — R600 Localized edema: Secondary | ICD-10-CM

## 2021-03-27 DIAGNOSIS — M25662 Stiffness of left knee, not elsewhere classified: Secondary | ICD-10-CM

## 2021-03-27 DIAGNOSIS — M25562 Pain in left knee: Secondary | ICD-10-CM

## 2021-03-27 DIAGNOSIS — M6281 Muscle weakness (generalized): Secondary | ICD-10-CM

## 2021-03-27 DIAGNOSIS — G8929 Other chronic pain: Secondary | ICD-10-CM

## 2021-03-27 NOTE — Therapy (Signed)
[place], [place], [address] Phone: [phone]   Fax:  [phone]  Physical Therapy Treatment  Patient Details  Name: Bradley Ray MRN: [id number] Date of Birth: Feb 12, 1956 Referring Provider (PT): [name], MD   Encounter Date: 03/27/2021   PT End of Session - 03/27/21 0941     Visit Number 17    Number of Visits 20    Date for PT Re-Evaluation 04/14/21    Authorization Type UHC managed MCD    Authorization - Visit Number 16    Authorization - Number of Visits 27    PT Start Time 0930    PT Stop Time 1010    PT Time Calculation (min) 40 min    Activity Tolerance Patient tolerated treatment well;Patient limited by pain    Behavior During Therapy [facility] for tasks assessed/performed             Past Medical History:  Diagnosis Date   Alcoholism ([place])    per epic documentation long hx dependence--- (06-30-2019  per pt was drinking average 3 shots dialy until 03/ 2020, since then one beer weekly)   Arthritis    knees   Asthma    Beta thalassemia trait    per hematologist/ oncologist note- dr Dorita Sciara--  per blood work-up  dx 02/ 2017   Bladder neck contracture    BPH with obstruction/lower urinary tract symptoms    Chronic kidney disease    Hx: of Hemodyalysis   Cocaine abuse, in remission ([place])    06-30-2019   per pt states last used 12/ 2017   COVID 11/09/2019   loss of taste and smell and appetite x 14 days all symptoms resolved   ED (erectile dysfunction)    Hepatitis    hepatitis a age 71   History of acute renal failure 12/2018   kidneys fully recovered no nephrologist per pt   History of chest pain    01-06-2019 hospital admission-- dx acute renal failure, hypotensive with associated shock liver, SIRS with tachycardia/ tachyapnea,  chf diastolic  ;   [phone]  per pt no chest pain since and everything resolved , followed by pcp   Hyperlipidemia    Hypertension     Prostate cancer [facility]) urologist-  dr wrenn/  oncologist-  dr Tammi Klippel   dx 07-02-2016,  T1c,  Gleason 3+3,  PSA 6.48,  48.44cc   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 (previously dr Al[facility])   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  anticoagulated w/ xarelto until june 2017 changed to asa 141m daily/   (06-30-2019 per pt now taking ASA 877m)   Wears dentures    upper    Past Surgical History:  Procedure Laterality Date   CYSTOSCOPY N/A 10/15/2016   Procedure: CYSTOSCOPY FLEXIBLE;  Surgeon: JoIrine SealMD;  [facility];  Service: Urology;  Laterality: N/A;  NO SEEDS FOUND IN BLADDER   CYSTOSCOPY N/A 08/01/2020   Procedure: CYSTOSCOPY/removal of bladder stone and bladder neck biopsy;  Surgeon: WrIrine SealMD;  [facility] Service: Urology;  Laterality: N/A;   IR FLUORO GUIDE CV LINE RIGHT  01/09/2019   IR [place]IDE VASC ACCESS RIGHT  01/09/2019   ORIF LEFT ANKLE  1980's   PROSTATE BIOPSY     RADIOACTIVE SEED IMPLANT N/A 10/15/2016   Procedure: RADIOACTIVE SEED IMPLANT/BRACHYTHERAPY IMPLANT;  Surgeon: JoIrine SealMD;  [facility];  Service: Urology;  Laterality: N/A;     65    SEEDS IMPLANTED   REPAIR EXTENSOR TENDON Right 06/02/2016   Procedure: RIGHT LONG FINGER EXTENSOR TENDON REPAIR;  Surgeon: Milly Jakob, MD;  Location: [facility];  Service: Orthopedics;  Laterality: Right;   TOTAL KNEE ARTHROPLASTY Right 2010  approx.   TOTAL KNEE ARTHROPLASTY Left 12/31/2020   Procedure: TOTAL KNEE ARTHROPLASTY;  Surgeon: [name], MD;  [facility];  Service: Orthopedics;  Laterality: Left;   TRANSURETHRAL INCISION OF BLADDER NECK N/A 07/04/2019   Procedure: CYSTOSCOPY WITH INCISION OF BLADDER NECK CONTRACTURE, BALLOON DILITATION;  Surgeon: [name], MD;  Location: [facility];  Service: Urology;  Laterality: N/A;   TRANSURETHRAL INCISION OF BLADDER NECK N/A 08/01/2020   Procedure: INCISION OF BLADDER NECK;   Surgeon: [name], MD;  Location: [facility];  Service: Urology;  Laterality: N/A;  1 HR   TRANSURETHRAL RESECTION OF PROSTATE N/A 06/16/2018   Procedure: TRANSURETHRAL RESECTION OF THE PROSTATE (TURP);  Surgeon: [name], MD;  [facility];  Service: Urology;  Laterality: N/A;    There were no vitals filed for this visit.   Subjective Assessment - 03/27/21 0936     Subjective I am doing great, just have a lot of clicking when I walk. It doesnt hurt but I can feel it go through my whole body.    Currently in Pain? No/denies                [facility] PT Assessment - 03/27/21 0001       AROM   Left Knee Extension 7   6   Left Knee Flexion 118   120 after stretching/mobs                          [facility] Adult PT Treatment/Exercise - 03/27/21 0001       Knee/Hip Exercises: Stretches   Active Hamstring Stretch 3 reps;30 seconds    Quad Stretch Limitations prone quad stretch      Knee/Hip Exercises: Aerobic   Recumbent Bike L2 x 7 min      Knee/Hip Exercises: Machines for Strengthening   Cybex Knee Extension 2 x 20 15# with LLE eccentric    Cybex Knee Flexion 2 x 20 LLE 15#      Knee/Hip Exercises: Standing   Step Down Limitations retro step up 10 x 2      Manual Therapy   Joint Mobilization A/P mobs for knee flexion and extension                      PT Short Term Goals - 03/03/21 0945       PT SHORT TERM GOAL #1   Title pt to be IND with inital HEP    Status Achieved      PT SHORT TERM GOAL #2   Title increase knee AROM total arc to >/= 10 - 90 degrees    Period Weeks    Status Achieved      PT SHORT TERM GOAL #3   Title reduce max pain to </= 4/10 for functional and therapeutic progression    Period Weeks    Status Achieved               PT Long Term Goals - 03/27/21 0932       PT LONG TERM GOAL #1   Title increase L knee total arc  ROM to >/= 5 - 120 for functional ROM required for efficient gait  and general mobility    Baseline 6-120 at end of session 03/27/21    Time 8    Period Weeks    Status Partially Met      PT LONG TERM GOAL #2   Title increase gross LLE strength to >/= 4+/5 to promote stability and safety with walking/ standing    Time 8    Period Weeks    Status On-going      PT LONG TERM GOAL #3   Title pt to be abel to sit, stand and walk for >/= 45 min with LRAD for in home and short community distances with max pain of </= 2/10    Time 8    Period Weeks    Status Achieved      PT LONG TERM GOAL #4   Title improve LEFS score to >/= 50/ 80 to demo improvement in function    Baseline 53/80    Time 8    Period Weeks    Status Achieved      PT LONG TERM GOAL #5   Title pt to be IND with all HEP given and is able to maintain and progress current ROM    Baseline independent  with current HEP and progressing as able.    Time 8    Period Weeks    Status On-going                   Plan - 03/27/21 0937     Clinical Impression Statement Pt reports he is mowing 2 yards in one day and is doing fine with that without pain. He has been working on strengthening and wants to focus stretching over last 2 visits. His AROM is improved, reacing 6-120 left knee today. 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 He should be ready for discahrge at next visit.    Personal Factors and Comorbidities Comorbidity 3+    PT Next Visit Plan discharge to HEP next, check MMT    PT Home Exercise Plan [id number]             Patient will benefit from skilled therapeutic intervention in order to improve the following deficits and impairments:  Improper body mechanics, Increased muscle spasms, Decreased strength, Abnormal gait, Pain, Decreased activity tolerance, Decreased endurance, Decreased range of motion, Increased edema  Visit Diagnosis: Localized edema  Stiffness of left knee, not elsewhere  classified  Chronic pain of left knee  Other abnormalities of gait and mobility  Muscle weakness (generalized)     Problem List Patient Active Problem List   Diagnosis Date Noted   S/P total knee arthroplasty, left 12/31/2020   Acquired contracture of bladder neck 07/04/2019   Hypertriglyceridemia 01/07/2019   Chest pain 01/07/2019   Chronic diastolic CHF (congestive heart failure) ([place]) 01/07/2019   AKI (acute kidney injury) ([place]) 01/07/2019   Shock liver 01/07/2019   BPH with urinary obstruction 06/16/2018   Malignant neoplasm of prostate ([place]) 08/10/2016   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   Essential hypertension 11/26/2015   Thalassemia trait, beta 11/26/2015   High anion gap metabolic acidosis 05/69/7948   Substance abuse in remission ([place]) 11/26/2015    Dorene Ar, PTA 03/27/2021, 10:11 AM  [facility] [address] [place], [place], [address] Phone: [phone]   Fax:  [phone]  Name: Bradley  DEREL Ray MRN: [id number] Date of Birth: 1956/04/22

## 2021-04-03 ENCOUNTER — Other Ambulatory Visit: Payer: Self-pay

## 2021-04-03 ENCOUNTER — Ambulatory Visit: Payer: Medicaid Other | Admitting: Physical Therapy

## 2021-04-03 DIAGNOSIS — G8929 Other chronic pain: Secondary | ICD-10-CM

## 2021-04-03 DIAGNOSIS — R2689 Other abnormalities of gait and mobility: Secondary | ICD-10-CM

## 2021-04-03 DIAGNOSIS — M25662 Stiffness of left knee, not elsewhere classified: Secondary | ICD-10-CM

## 2021-04-03 DIAGNOSIS — M6281 Muscle weakness (generalized): Secondary | ICD-10-CM

## 2021-04-03 DIAGNOSIS — R600 Localized edema: Secondary | ICD-10-CM

## 2021-04-03 DIAGNOSIS — Z96652 Presence of left artificial knee joint: Secondary | ICD-10-CM

## 2021-04-03 DIAGNOSIS — M25562 Pain in left knee: Secondary | ICD-10-CM | POA: Diagnosis not present

## 2021-04-03 NOTE — Therapy (Signed)
[place], [place], [address] Phone: [phone]   Fax:  [phone]  Physical Therapy Treatment / Discharge  Patient Details  Name: Bradley Ray MRN: [id number] Date of Birth: [birth year] Referring Provider (PT): [name], MD   Encounter Date: 04/03/2021   PT End of Session - 04/03/21 0912     Visit Number 18    Number of Visits 20    Date for PT Re-Evaluation 04/14/21    Authorization Type UHC managed MCD    Authorization - Visit Number 17    Authorization - Number of Visits 27    PT Start Time 0845    PT Stop Time 0913   shortened session due to discharge   PT Time Calculation (min) 28 min    Activity Tolerance Patient tolerated treatment well    Behavior During Therapy [facility] for tasks assessed/performed             Past Medical History:  Diagnosis Date   Alcoholism ([place])    per epic documentation long hx dependence--- (06-30-2019  per pt was drinking average 3 shots dialy until 03/ 2020, since then one beer weekly)   Arthritis    knees   Asthma    Beta thalassemia trait    per hematologist/ oncologist note- dr Dorita Sciara--  per blood work-up  dx 02/ 2017   Bladder neck contracture    BPH with obstruction/lower urinary tract symptoms    Chronic kidney disease    Hx: of Hemodyalysis   Cocaine abuse, in remission ([place])    06-30-2019   per pt states last used 12/ 2017   COVID 11/09/2019   loss of taste and smell and appetite x 14 days all symptoms resolved   ED (erectile dysfunction)    Hepatitis    hepatitis a age 79   History of acute renal failure 12/2018   kidneys fully recovered no nephrologist per pt   History of chest pain    01-06-2019 hospital admission-- dx acute renal failure, hypotensive with associated shock liver, SIRS with tachycardia/ tachyapnea,  chf diastolic  ;   [id number]  per pt no chest pain since and everything resolved , followed by pcp   Hyperlipidemia     Hypertension    Prostate cancer [facility]) urologist-  dr wrenn/  oncologist-  dr Tammi Klippel   dx 07-02-2016,  T1c,  Gleason 3+3,  PSA 6.48,  48.44cc   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 (previously dr Alvy [facility])   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  anticoagulated w/ xarelto until june 2017 changed to asa 120m daily/   (06-30-2019 per pt now taking ASA 840m)   Wears dentures    upper    Past Surgical History:  Procedure Laterality Date   CYSTOSCOPY N/A 10/15/2016   Procedure: CYSTOSCOPY FLEXIBLE;  Surgeon: JoIrine SealMD;  [facility];  Service: Urology;  Laterality: N/A;  NO SEEDS FOUND IN BLADDER   CYSTOSCOPY N/A 08/01/2020   Procedure: CYSTOSCOPY/removal of bladder stone and bladder neck biopsy;  Surgeon: WrIrine SealMD;  [facility] Service: [facility]rology;  Laterality: N/A;   IR FLUORO GUIDE CV LINE RIGHT  01/09/2019   IR [place]IDE VASC ACCESS RIGHT  01/09/2019   ORIF LEFT ANKLE  1980's   PROSTATE BIOPSY     RADIOACTIVE SEED IMPLANT N/A 10/15/2016   Procedure: RADIOACTIVE SEED IMPLANT/BRACHYTHERAPY IMPLANT;  Surgeon: JoIrine SealMD;  [facility];  Service: Urology;  Laterality: N/A;     65    SEEDS IMPLANTED   REPAIR EXTENSOR TENDON Right 06/02/2016   Procedure: RIGHT LONG FINGER EXTENSOR TENDON REPAIR;  Surgeon: Milly Jakob, MD;  Location: [facility];  Service: Orthopedics;  Laterality: Right;   TOTAL KNEE ARTHROPLASTY Right 2010  approx.   TOTAL KNEE ARTHROPLASTY Left 12/31/2020   Procedure: TOTAL KNEE ARTHROPLASTY;  Surgeon: [name], MD;  [facility];  Service: Orthopedics;  Laterality: Left;   TRANSURETHRAL INCISION OF BLADDER NECK N/A 07/04/2019   Procedure: CYSTOSCOPY WITH INCISION OF BLADDER NECK CONTRACTURE, BALLOON DILITATION;  Surgeon: [name], MD;  Location: [facility];  Service: Urology;  Laterality: N/A;   TRANSURETHRAL INCISION OF BLADDER NECK N/A 08/01/2020   Procedure: INCISION OF  BLADDER NECK;  Surgeon: [name], MD;  [facility];  Service: Urology;  Laterality: N/A;  1 HR   TRANSURETHRAL RESECTION OF PROSTATE N/A 06/16/2018   Procedure: TRANSURETHRAL RESECTION OF THE PROSTATE (TURP);  Surgeon: [name], MD;  [facility];  Service: Urology;  Laterality: N/A;    There were no vitals filed for this visit.   Subjective Assessment - 04/03/21 0849     Subjective " I have no compliants or issues today."    Patient Stated Goals get back to mowing the lawn, get back to hitting the dance floor.    Currently in Pain? No/denies    Aggravating Factors  N/A    Pain Relieving Factors N/A                [facility] PT Assessment - 04/03/21 0001       Assessment   Medical Diagnosis L TKA    Referring Provider (PT) [name], MD      AROM   Left Knee Extension 5    Left Knee Flexion 112      Strength   Left Hip Flexion 4+/5    Left Hip Extension 4/5    Left Hip ABduction 4/5    Left Hip ADduction 4+/5                           [facility] Adult PT Treatment/Exercise - 04/03/21 0001       Knee/Hip Exercises: Stretches   Active Hamstring Stretch 1 rep;30 seconds      Knee/Hip Exercises: Aerobic   Elliptical L1 x 5 min ramp L1      Knee/Hip Exercises: Standing   SLS 3 x 30 seconds LLE only   min postural sway but did touch down with RLE multiple times     Knee/Hip Exercises: Seated   Sit to Sand 10 reps;without UE support   standing bil LE, eccentric loading LLE only                   PT Education - 04/03/21 0901     Education Details Reviewed HEP and updated today. how to progress strengthening and endurance via increased reps, sets and resistance.    Person(s) Educated Patient    Methods Explanation;Verbal cues;Handout    Comprehension Verbal cues required              PT Short Term Goals - 03/03/21 0945       PT SHORT TERM GOAL #1   Title pt to be IND with inital HEP    Status  Achieved  PT SHORT TERM GOAL #2   Title increase knee AROM total arc to >/= 10 - 90 degrees    Period Weeks    Status Achieved      PT SHORT TERM GOAL #3   Title reduce max pain to </= 4/10 for functional and therapeutic progression    Period Weeks    Status Achieved               PT Long Term Goals - 04/03/21 8938       PT LONG TERM GOAL #1   Title increase L knee total arc ROM to >/= 5 - 120 for functional ROM required for efficient gait and general mobility    Period Weeks      PT LONG TERM GOAL #2   Title increase gross LLE strength to >/= 4+/5 to promote stability and safety with walking/ standing    Period Weeks      PT LONG TERM GOAL #3   Title pt to be abel to sit, stand and walk for >/= 45 min with LRAD for in home and short community distances with max pain of </= 2/10    Status Achieved      PT LONG TERM GOAL #4   Title improve LEFS score to >/= 50/ 80 to demo improvement in function    Status Achieved      PT LONG TERM GOAL #5   Title pt to be IND with all HEP given and is able to maintain and progress current ROM    Period Weeks    Status Achieved                   Plan - 04/03/21 0909     Clinical Impression Statement Taegen has made excellent progress with physical therapy increasing knee ROM, gross LLE strength and additionally reports no pain in the L knee. He does continue to flucutate with his L knee AROM which last sessin his total arc ROM was 7 - 118, and today is 5 - 113 degrees. HE perofrmed all exercises well today with no issues. He has met all goals today and is able to maintain and progress his current LOF and will be D/C from PT today.    PT Treatment/Interventions ADLs/Self Care Home Management;Cryotherapy;Gait training;Stair training;Functional mobility training;Therapeutic activities;Therapeutic exercise;Balance training;Neuromuscular re-education;Patient/family education;Manual techniques;Passive range of motion;Scar  mobilization;Taping;Vasopneumatic Device    PT Next Visit Plan d/c    PT Home Exercise Plan [id number]             Patient will benefit from skilled therapeutic intervention in order to improve the following deficits and impairments:  Improper body mechanics, Increased muscle spasms, Decreased strength, Abnormal gait, Pain, Decreased activity tolerance, Decreased endurance, Decreased range of motion, Increased edema  Visit Diagnosis: Localized edema  Stiffness of left knee, not elsewhere classified  Chronic pain of left knee  Other abnormalities of gait and mobility  Muscle weakness (generalized)  Total knee replacement status, left     Problem List Patient Active Problem List   Diagnosis Date Noted   S/P total knee arthroplasty, left 12/31/2020   Acquired contracture of bladder neck 07/04/2019   Hypertriglyceridemia 01/07/2019   Chest pain 01/07/2019   Chronic diastolic CHF (congestive heart failure) ([place]) 01/07/2019   AKI (acute kidney injury) ([place]) 01/07/2019   Shock liver 01/07/2019   BPH with urinary obstruction 06/16/2018   Malignant neoplasm of prostate ([place]) 08/10/2016   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   Essential hypertension  11/26/2015   Thalassemia trait, beta 11/26/2015   High anion gap metabolic acidosis 54/27/0623   Substance abuse in remission [facility]) 11/26/2015    Starr Lake 04/03/2021, 9:13 AM  [facility] [address] [place], [place], [address] Phone: [phone]   Fax:  [phone]  Name: Bradley Ray MRN: [id number] Date of Birth: [birth year]      PHYSICAL THERAPY DISCHARGE SUMMARY  Visits from Start of Care: 18  Current functional level related to goals / functional outcomes: See goals,    Remaining deficits: See assessment   Education / Equipment: HEP, theraband, posture, lifting mechanics   Patient agrees to discharge. Patient goals were met. Patient is being discharged  due to meeting the stated rehab goals.  Ramona Ruark PT, DPT, LAT, ATC  04/03/21  9:14 AM

## 2021-04-03 NOTE — Patient Instructions (Signed)
Access Code: [email or web address] URL: [email or web address] Date: 04/03/2021 Prepared by: Starr Lake  Exercises Supine Heel Slide with Strap - 1 x daily - 7 x weekly - 2 sets - 10 reps - 5 seconds hold Supine Hamstring Stretch with Strap - 1 x daily - 7 x weekly - 2 sets - 2 reps - 30 hold SLR - 1 x daily - 7 x weekly - 10 reps - 2 sets - 1 hold Side Stepping with Resistance at Ankles and Counter Support - 1 x daily - 7 x weekly - 2 sets - 10 reps Forward and Backward Monster Walk with Resistance at Ankles and Counter Support - 1 x daily - 7 x weekly - 2 sets - 10 reps Squat with Counter Support - 1 x daily - 7 x weekly - 2 sets - 10 reps Hip Flexor Stretch at Edge of Bed - 2 x daily - 7 x weekly - 2 sets - 2 reps - 30 seconds hold Sit to Stand - 1 x daily - 7 x weekly - 2 sets - 10 reps Standing Single Leg Stance with Counter Support - 1 x daily - 7 x weekly - 2 sets - 5 reps - 30 seconds hold

## 2021-05-05 ENCOUNTER — Telehealth (INDEPENDENT_AMBULATORY_CARE_PROVIDER_SITE_OTHER): Payer: Medicaid Other | Admitting: *Deleted

## 2021-05-05 NOTE — Telephone Encounter (Signed)
   [facility] Pre-operative Risk Assessment    Patient Name: Bradley Ray  DOB: 11-Mar-1956 MRN: [id number]  HEARTCARE STAFF:  - IMPORTANT!!!!!! Under Visit Info/Reason for Call, type in Other and utilize the format Clearance MM/DD/YY or Clearance TBD. Do not use dashes or single digits. - Please review there is not already an duplicate clearance open for this procedure. - If request is for dental extraction, please clarify the # of teeth to be extracted. - If the patient is currently at the dentist's office, call Pre-Op Callback Staff (MA/nurse) to input urgent request.  - If the patient is not currently in the dentist office, please route to the Pre-Op pool.  Request for surgical clearance:  What type of surgery is being performed? COLONOSCOPY  When is this surgery scheduled? 08/22/21  What type of clearance is required (medical clearance vs. Pharmacy clearance to hold med vs. Both)? MEDICAL  Are there any medications that need to be held prior to surgery and how long?  ASA   Practice name and name of physician performing surgery? [facility]; DR. Michail Sermon  What is the office phone number? [phone]   7.   What is the office fax number? [phone]  8.   Anesthesia type (None, local, MAC, general) ? PROPOFOL   Bradley Ray 05/05/2021, 4:26 PM  _________________________________________________________________   (provider comments below)

## 2021-05-06 NOTE — Telephone Encounter (Signed)
   Primary Cardiologist: Candee Furbish, MD  Chart reviewed as part of pre-operative protocol coverage. Given past medical history and time since last visit, based on ACC/AHA guidelines, Bradley Ray would be at acceptable risk for the planned procedure without further cardiovascular testing.   His aspirin may be held for 5-7 days prior to his procedure.  Please resume as soon as hemostasis is achieved.  I will route this recommendation to the requesting party via Epic fax function and remove from pre-op pool.  Please call with questions.  Jossie Ng. Davonna Ertl NP-C    05/06/2021, 7:26 AM [facility] [address] Office [phone] Fax [phone]

## 2021-05-21 ENCOUNTER — Telehealth: Payer: Self-pay

## 2021-05-21 NOTE — Telephone Encounter (Signed)
   [facility] Pre-operative Risk Assessment    Patient Name: Bradley Ray  DOB: 1956-01-30 MRN: [id number]  HEARTCARE STAFF:  - IMPORTANT!!!!!! Under Visit Info/Reason for Call, type in Other and utilize the format Clearance MM/DD/YY or Clearance TBD. Do not use dashes or single digits. - Please review there is not already an duplicate clearance open for this procedure. - If request is for dental extraction, please clarify the # of teeth to be extracted. - If the patient is currently at the dentist's office, call Pre-Op Callback Staff (MA/nurse) to input urgent request.  - If the patient is not currently in the dentist office, please route to the Pre-Op pool.  Request for surgical clearance:  What type of surgery is being performed? Colonoscopy  When is this surgery scheduled? 08/22/2021  What type of clearance is required (medical clearance vs. Pharmacy clearance to hold med vs. Both)? Medical  Are there any medications that need to be held prior to surgery and how long? None listed but pt takes Aspirin  Practice name and name of physician performing surgery? [facility] - Bradley Corner, MD  What is the office phone number? [phone]   7.   What is the office fax number? [phone]  8.   Anesthesia type (None, local, MAC, general) ? Not listed   Bradley Ray 05/21/2021, 5:10 PM  _________________________________________________________________   (provider comments below)

## 2021-05-22 NOTE — Telephone Encounter (Signed)
This is a duplicate clearance. I will re-route original clearance per Coletta Memos to requesting party and remove this from preop box.

## 2021-06-09 NOTE — Telephone Encounter (Signed)
Our office received a clearance request. This looks to be a duplicate though Dr. Kathline Magic office wrote on the clearance request that it is not a duplicate though all the information is the same as seen on the clearance note from 04/2021 and 05/2021. I tried to call their office to clarify why they are stating that this is not a duplicate as all the information seems to be the same. Looks like procedure is not until 08/22/21. Since I was not able to reach their office by phone, I will fax these notes to their office today in hopes that we can try to clarify.

## 2021-06-09 NOTE — Telephone Encounter (Signed)
I did leave a message for the pt to call back and ask to s/w the pre op team so that we may explain to him that we probably should bring him in sooner due to low HR while he was at the GI office.

## 2021-06-09 NOTE — Telephone Encounter (Signed)
ADDENDUM: COMPUTER FROZE AND SHUT DOWN.  Assured Melissa once the pt has been cleared we will fax over clearance.

## 2021-06-09 NOTE — Telephone Encounter (Signed)
Dr. Kathline Magic surgery scheduler faxed over another clearance with the office note attached. The request earlier today did not have the office note attached. I read the MD's office note which states this is a "repeat colon". I called and left message for surgery scheduler Melissa to please call our office to clarify. I am not sure if we are missing something that isn't coming across through the fax machine or not.   Melissa from [facility] called back and stated what they are wanting further clearance on was that the pt's HR at their ov appt was 40 BPM. Dr. Kathline Magic office is not sure if that is a normal HR for the pt. I stated 40 BPM is below the normal range. I advised that we may want to bring the pt in sooner than his 09/01/21 appt to re-evaluate his HR. Lenna Sciara is in agreement as well. I assured her that I will reach out to the pt and see if we can move his appt up sooner. Melissa and I both thanked each other for helping to clarify what is needed in order for the pre op clearance. I assured Melissa once the pt has been seen and haas been

## 2021-06-10 NOTE — Telephone Encounter (Signed)
Left message for pt to call back so that we may have him see APP sooner, see previous notes.

## 2021-06-11 NOTE — Telephone Encounter (Signed)
I was able to reach pt today. I s/w him and let him know that Dr. Kathline Magic office (who pt has upcoming procedure 08/22/21) has informed our office that his HR was 40 just the other day at appt with Dr. Michail Sermon. Dr. Kathline Magic office was concerned and wanted to make sure we were aware. At that time I s/w Dr. Kathline Magic office and informed them that I will reach out to pt and have him come in sooner for appt.   While speaking with the pt this morning; he tells me that his HR the other morning was 37 and stated he sleeps on his left side and said he could feel his heart beating so hard. Pt denies any cp, sob, dizziness. I advised the pt that I felt he needs to be seen. I have put the pt on the DOD schedule for  Friday 06/13/21 9:30 with Dr. Acie Fredrickson. I advised pt to go to ED if any cp, sob, dizziness. Pt is very grateful for my call and help today. I felt pt should be seen sooner due to low heart rates. I will forward these notes to MD for appt. as pt is also going to need pre op clearance.   I will send FYI to Dr. Kathline Magic office as to update.

## 2021-06-12 ENCOUNTER — Encounter: Payer: Self-pay | Admitting: Cardiovascular Disease

## 2021-06-12 NOTE — Progress Notes (Signed)
Cardiology Office Note:    Date:  06/13/2021   ID:  Bradley Ray, DOB 24-Jul-1956, MRN [id number]  PCP:  Nolene Ebbs, MD   [facility] Providers Cardiologist:  Candee Furbish, MD {    Referring MD: Nolene Ebbs, MD   Chief Complaint  Patient presents with   Bradycardia   Hyperlipidemia     History of Present Illness:    Bradley Ray is a 65 y.o. male with a hx of HTN,   Is going for colonoscopy  HR was found to be very low in his p his heart rate was checked by his pulse rate and not an EKG.  His heart rate was found to be low.   Re - op   He denies any dizziness.  No chest pain, no shortness of breath or.  No syncope. Still very active, mows Walks constantly .  Potassium has been low  - was 3.2 at his last check   BP is frequently elevated    Past Medical History:  Diagnosis Date   Alcoholism ([place])    per epic documentation long hx dependence--- (06-30-2019  per pt was drinking average 3 shots dialy until 03/ 2020, since then one beer weekly)   Arthritis    knees   Asthma    Beta thalassemia trait    per hematologist/ oncologist note- dr Dorita Sciara--  per blood work-up  dx 02/ 2017   Bladder neck contracture    BPH with obstruction/lower urinary tract symptoms    Chronic kidney disease    Hx: of Hemodyalysis   Cocaine abuse, in remission ([place])    06-30-2019   per pt states last used 12/ 2017   COVID 11/09/2019   loss of taste and smell and appetite x 14 days all symptoms resolved   ED (erectile dysfunction)    Hepatitis    hepatitis a age 32   History of acute renal failure 12/2018   kidneys fully recovered no nephrologist per pt   History of chest pain    01-06-2019 hospital admission-- dx acute renal failure, hypotensive with associated shock liver, SIRS with tachycardia/ tachyapnea,  chf diastolic  ;   [id number]  per pt no chest pain since and everything resolved , followed by pcp   Hyperlipidemia    Hypertension    Prostate cancer [facility]) urologist-   dr wrenn/  oncologist-  dr Tammi Klippel   dx 07-02-2016,  T1c,  Gleason 3+3,  PSA 6.48,  48.44cc   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 (previously dr Alvy Bimler [facility])   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  anticoagulated w/ xarelto until june 2017 changed to asa '162mg'$  daily/   (06-30-2019 per pt now taking ASA '81mg'$  )   Wears dentures    upper    Past Surgical History:  Procedure Laterality Date   CYSTOSCOPY N/A 10/15/2016   Procedure: CYSTOSCOPY FLEXIBLE;  Surgeon: [name], MD;  Location: [facility];  Service: Urology;  Laterality: N/A;  NO SEEDS FOUND IN BLADDER   CYSTOSCOPY N/A 08/01/2020   Procedure: CYSTOSCOPY/removal of bladder stone and bladder neck biopsy;  Surgeon: [name], MD;  Location: [facility];  Service: Urology;  Laterality: N/A;   IR FLUORO GUIDE CV LINE RIGHT  01/09/2019   IR US GUIDE VASC ACCESS RIGHT  01/09/2019   ORIF LEFT ANKLE  1980's   PROSTATE BIOPSY     RADIOACTIVE SEED IMPLANT N/A 10/15/2016   Procedure: RADIOACTIVE SEED IMPLANT/BRACHYTHERAPY IMPLANT;  Surgeon:  [name], MD;  Location: [facility];  Service: Urology;  Laterality: N/A;     66    SEEDS IMPLANTED   REPAIR EXTENSOR TENDON Right 06/02/2016   Procedure: RIGHT LONG FINGER EXTENSOR TENDON REPAIR;  Surgeon: Milly Jakob, MD;  Location: [facility];  Service: Orthopedics;  Laterality: Right;   TOTAL KNEE ARTHROPLASTY Right 2010  approx.   TOTAL KNEE ARTHROPLASTY Left 12/31/2020   Procedure: TOTAL KNEE ARTHROPLASTY;  Surgeon: [name], MD;  Location: [facility];  Service: Orthopedics;  Laterality: Left;   TRANSURETHRAL INCISION OF BLADDER NECK N/A 07/04/2019   Procedure: CYSTOSCOPY WITH INCISION OF BLADDER NECK CONTRACTURE, BALLOON DILITATION;  Surgeon: [name], MD;  Location: [facility];  Service: Urology;  Laterality: N/A;   TRANSURETHRAL INCISION OF BLADDER NECK N/A 08/01/2020   Procedure: INCISION OF BLADDER NECK;  Surgeon: [name], MD;  Location:  [facility];  Service: Urology;  Laterality: N/A;  1 HR   TRANSURETHRAL RESECTION OF PROSTATE N/A 06/16/2018   Procedure: TRANSURETHRAL RESECTION OF THE PROSTATE (TURP);  Surgeon: [name], MD;  Location: [facility];  Service: Urology;  Laterality: N/A;    Current Medications: Current Meds  Medication Sig   albuterol (PROVENTIL HFA;VENTOLIN HFA) 108 (90 Base) MCG/ACT inhaler Inhale 2 puffs into the lungs every 6 (six) hours as needed for wheezing or shortness of breath.   aspirin EC 81 MG tablet Take 1 tablet (81 mg total) by mouth in the morning and at bedtime. To prevent blood clots after surgery   atorvastatin (LIPITOR) 40 MG tablet Take 1 tablet (40 mg total) by mouth daily.   folic acid (FOLVITE) 1 MG tablet Take 1 tablet (1 mg total) by mouth daily.   Ketotifen Fumarate (ALAWAY OP) Place 1 drop into both eyes daily as needed (itchy eyes).   metoprolol succinate (TOPROL-XL) 50 MG 24 hr tablet Take 50 mg by mouth daily.   spironolactone (ALDACTONE) 25 MG tablet Take 1 tablet (25 mg total) by mouth daily.   thiamine (VITAMIN B-1) 100 MG tablet Take 100 mg by mouth daily.     Allergies:   Morphine and related   Social History   Socioeconomic History   Marital status: Single    Spouse name: Not on file   Number of children: Not on file   Years of education: Not on file   Highest education level: Not on file  Occupational History   Not on file  Tobacco Use   Smoking status: Former    Packs/day: 1.00    Years: 30.00    Pack years: 30.00    Types: Cigarettes    Quit date: 06/01/2006    Years since quitting: 15.0   Smokeless tobacco: Never  Vaping Use   Vaping Use: Never used  Substance and Sexual Activity   Alcohol use: Yes    Comment: 1 beer fewdays ago  as of 07-23-2020   Drug use: Yes    Types: "Crack" cocaine, Marijuana    Comment: per pt on 06-30-2019  states no cocaine since  2017/ last marijuana last used 07-25-2020   Sexual activity: Yes  Other Topics  Concern   Not on file  Social History Narrative   Not on file   Social Determinants of Health   Financial Resource Strain: Not on file  Food Insecurity: Not on file  Transportation Needs: Not on file  Physical Activity: Not on file  Stress: Not on file  Social Connections:  Not on file     Family History: The patient's family history includes Arrhythmia in his mother; Cancer in his maternal aunt, maternal uncle, sister, and another family member; Clotting disorder in his sister; Pancreatic cancer in his sister; Sickle cell trait in his son; Throat cancer in his father.  ROS:   Please see the history of present illness.     All other systems reviewed and are negative.  EKGs/Labs/Other Studies Reviewed:    The following studies were reviewed today:   EKG: June 13, 2021: Normal sinus rhythm at 68.  Occasional premature ventricular contractions.  Recent Labs: 01/01/2021: B Natriuretic Peptide 86.1; BUN 6; Creatinine, Ser 0.74; Hemoglobin 10.8; Magnesium 1.5; Platelets 161; Potassium 3.2; Sodium 136  Recent Lipid Panel    Component Value Date/Time   CHOL 111 01/02/2021 0359   TRIG 63 01/02/2021 0359   HDL 48 01/02/2021 0359   CHOLHDL 2.3 01/02/2021 0359   VLDL 13 01/02/2021 0359   LDLCALC 50 01/02/2021 0359     Risk Assessment/Calculations:           Physical Exam:    VS:  BP 140/80   Pulse 68   Resp (!) 94   Ht '5\' 6"'$  (1.676 m)   Wt 172 lb 6.4 oz (78.2 kg)   BMI 27.83 kg/m     Wt Readings from Last 3 Encounters:  06/13/21 172 lb 6.4 oz (78.2 kg)  03/21/21 174 lb (78.9 kg)  03/05/21 174 lb (78.9 kg)     GEN:  Well nourished, well developed in no acute distress HEENT: Normal NECK: No JVD; No carotid bruits LYMPHATICS: No lymphadenopathy CARDIAC: RRR, no murmurs, rubs, gallops,  occasional premature beats  RESPIRATORY:  Clear to auscultation without rales, wheezing or rhonchi  ABDOMEN: Soft, non-tender, non-distended MUSCULOSKELETAL:  No edema; No  deformity  SKIN: Warm and dry NEUROLOGIC:  Alert and oriented x 3 PSYCHIATRIC:  Normal affect   ASSESSMENT:    1. CAD in native artery   2. DOE (dyspnea on exertion)   3. Essential hypertension    PLAN:    In order of problems listed above:  Bradycardia on exam: I suspect that the "bradycardia" was actually due to frequent premature ventricular contractions.  He has PVCs on his EKG today.  His potassium has been low in the past.  It was 3.2 at his last check.  He has not had any episodes of syncope or presyncope.  So I think that this bradycardia was actually an artifact.  Heart rate is 68 today.  I encouraged him to eat some additional potassium containing foods.  We will start spironolactone 25 mg a day.  We will recheck a basic metabolic profile in 2 to 3 weeks.  He is scheduled to see Richardson Dopp, PA in several months.  He will call us back if he has any issues.  He is at low risk for his upcoming colonoscopy.  I will sign the clearance form and send it to our preop pool.  2.  Hypertension: We will be adding spironolactone 25 mg a day.  He will follow-up with Richardson Dopp, PA in several months.        Medication Adjustments/Labs and Tests Ordered: Current medicines are reviewed at length with the patient today.  Concerns regarding medicines are outlined above.  Orders Placed This Encounter  Procedures   Basic metabolic panel   EKG [id number]    Meds ordered this encounter  Medications   spironolactone (ALDACTONE) 25 MG  tablet    Sig: Take 1 tablet (25 mg total) by mouth daily.    Dispense:  90 tablet    Refill:  3     Patient Instructions  Medication Instructions:  Your physician has recommended you make the following change in your medication:  1) START taking spironolactone 25 mg daily  *If you need a refill on your cardiac medications before your next appointment, please call your pharmacy*   Lab Work: BMET in 2-3 weeks If you have labs (blood work) drawn  today and your tests are completely normal, you will receive your results only by: [place] (if you have MyChart) OR A paper copy in the mail If you have any lab test that is abnormal or we need to change your treatment, we will call you to review the results.  Follow-Up: At [facility], you and your health needs are our priority.  As part of our continuing mission to provide you with exceptional heart care, we have created designated Provider Care Teams.  These Care Teams include your primary Cardiologist (physician) and Advanced Practice Providers (APPs -  Physician Assistants and Nurse Practitioners) who all work together to provide you with the care you need, when you need it.  Your next appointment:   09/01/21 at 8:15am   The format for your next appointment:   In Person  Provider:   Richardson Dopp, PA-C   Signed, Mertie Moores, MD  06/13/2021 9:49 AM    [place]

## 2021-06-13 ENCOUNTER — Other Ambulatory Visit: Payer: Self-pay

## 2021-06-13 ENCOUNTER — Ambulatory Visit: Payer: Medicaid Other | Admitting: Cardiovascular Disease

## 2021-06-13 ENCOUNTER — Encounter (INDEPENDENT_AMBULATORY_CARE_PROVIDER_SITE_OTHER): Payer: Self-pay

## 2021-06-13 ENCOUNTER — Encounter: Payer: Self-pay | Admitting: Cardiovascular Disease

## 2021-06-13 VITALS — BP 140/80 | HR 68 | Resp 94 | Ht 66.0 in | Wt 172.4 lb

## 2021-06-13 DIAGNOSIS — I2510 Atherosclerotic heart disease of native coronary artery without angina pectoris: Secondary | ICD-10-CM

## 2021-06-13 DIAGNOSIS — R0600 Dyspnea, unspecified: Secondary | ICD-10-CM

## 2021-06-13 DIAGNOSIS — I10 Essential (primary) hypertension: Secondary | ICD-10-CM | POA: Diagnosis not present

## 2021-06-13 DIAGNOSIS — R0609 Other forms of dyspnea: Secondary | ICD-10-CM

## 2021-06-13 MED ORDER — SPIRONOLACTONE 25 MG PO TABS: 25.0000 mg | ORAL_TABLET | Freq: Every day | ORAL | 3 refills | Status: DC

## 2021-06-13 NOTE — Addendum Note (Signed)
Addended by: Thayer Headings on: 06/13/2021 09:51 AM   Modules accepted: Level of Service

## 2021-06-13 NOTE — Telephone Encounter (Signed)
Pt is at low risk for his upcoming colonoscopy     Mertie Moores, MD  06/13/2021 9:51 AM    [address],  [place], [place]  [address] Phone: [phone]; Fax: [phone]

## 2021-06-13 NOTE — Patient Instructions (Signed)
Medication Instructions:  Your physician has recommended you make the following change in your medication:  1) START taking spironolactone 25 mg daily  *If you need a refill on your cardiac medications before your next appointment, please call your pharmacy*   Lab Work: BMET in 2-3 weeks If you have labs (blood work) drawn today and your tests are completely normal, you will receive your results only by: [place] (if you have MyChart) OR A paper copy in the mail If you have any lab test that is abnormal or we need to change your treatment, we will call you to review the results.  Follow-Up: At [facility], you and your health needs are our priority.  As part of our continuing mission to provide you with exceptional heart care, we have created designated Provider Care Teams.  These Care Teams include your primary Cardiologist (physician) and Advanced Practice Providers (APPs -  Physician Assistants and Nurse Practitioners) who all work together to provide you with the care you need, when you need it.  Your next appointment:   09/01/21 at 8:15am   The format for your next appointment:   In Person  Provider:   Richardson Dopp, PA-C

## 2021-06-30 ENCOUNTER — Other Ambulatory Visit: Payer: Self-pay

## 2021-06-30 ENCOUNTER — Other Ambulatory Visit: Payer: Medicaid Other

## 2021-07-02 ENCOUNTER — Other Ambulatory Visit: Payer: Self-pay

## 2021-07-02 ENCOUNTER — Other Ambulatory Visit: Payer: Medicaid Other | Admitting: *Deleted

## 2021-07-02 DIAGNOSIS — I10 Essential (primary) hypertension: Secondary | ICD-10-CM

## 2021-07-02 LAB — BASIC METABOLIC PANEL
BUN/Creatinine Ratio: 15 (ref 10–24)
BUN: 12 mg/dL (ref 8–27)
CO2: 22 mmol/L (ref 20–29)
Calcium: 9.5 mg/dL (ref 8.6–10.2)
Chloride: 106 mmol/L (ref 96–106)
Creatinine, Ser: 0.78 mg/dL (ref 0.76–1.27)
Glucose: 78 mg/dL (ref 65–99)
Potassium: 4.3 mmol/L (ref 3.5–5.2)
Sodium: 142 mmol/L (ref 134–144)
eGFR: 100 mL/min/{1.73_m2} (ref >59)

## 2021-07-04 ENCOUNTER — Encounter: Payer: Self-pay | Admitting: *Deleted

## 2021-08-31 NOTE — Progress Notes (Signed)
Office Visit    Patient Name: Bradley Ray Date of Encounter: 09/01/2021  PCP:  Nolene Ebbs, MD   Bradley Ray  Cardiologist:  Candee Furbish, MD  Advanced Practice Provider:  No care team member to display Electrophysiologist:  None   Chief Complaint    Bradley Ray is a 65 y.o. male with a hx of hypertension, alcoholism, cocaine abuse in remission,hyperlipidemia, and hx of prostate cancer who presents today for bradycardia with frequent PVCs, and initiation of spironolactone for hypertension by Dr. Acie Fredrickson.  Past Medical History    Past Medical History:  Diagnosis Date   Alcoholism ([place])    per epic documentation long hx dependence--- (06-30-2019  per pt was drinking average 3 shots dialy until 03/ 2020, since then one beer weekly)   Arthritis    knees   Asthma    Beta thalassemia trait    per hematologist/ oncologist note- dr Dorita Sciara--  per blood work-up  dx 02/ 2017   Bladder neck contracture    BPH with obstruction/lower urinary tract symptoms    Chronic kidney disease    Hx: of Hemodyalysis   Cocaine abuse, in remission ([place])    06-30-2019   per pt states last used 12/ 2017   COVID 11/09/2019   loss of taste and smell and appetite x 14 days all symptoms resolved   ED (erectile dysfunction)    Hepatitis    hepatitis a age 62   History of acute renal failure 12/2018   kidneys fully recovered no nephrologist per pt   History of chest pain    01-06-2019 hospital admission-- dx acute renal failure, hypotensive with associated shock liver, SIRS with tachycardia/ tachyapnea,  chf diastolic  ;   [phone]  per pt no chest pain since and everything resolved , followed by pcp   Hyperlipidemia    Hypertension    Prostate cancer [facility]) urologist-  dr wrenn/  oncologist-  dr Tammi Klippel   dx 07-02-2016,  T1c,  Gleason 3+3,  PSA 6.48,  48.44cc   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 (previously dr Alvy Bimler -[facility])   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  anticoagulated w/ xarelto until june 2017 changed to asa 162mg  daily/   (06-30-2019 per pt now taking ASA 81mg  )   Wears dentures    upper   Past Surgical History:  Procedure Laterality Date   CYSTOSCOPY N/A 10/15/2016   Procedure: CYSTOSCOPY FLEXIBLE;  Surgeon: [name], MD;  Location: [facility];  Service: Urology;  Laterality: N/A;  NO SEEDS FOUND IN BLADDER   CYSTOSCOPY N/A 08/01/2020   Procedure: CYSTOSCOPY/removal of bladder stone and bladder neck biopsy;  Surgeon: [name], MD;  Location: [facility];  Service: Urology;  Laterality: N/A;   IR FLUORO GUIDE CV LINE RIGHT  01/09/2019   IR US GUIDE VASC ACCESS RIGHT  01/09/2019   ORIF LEFT ANKLE  1980's   PROSTATE BIOPSY     RADIOACTIVE SEED IMPLANT N/A 10/15/2016   Procedure: RADIOACTIVE SEED IMPLANT/BRACHYTHERAPY IMPLANT;  Surgeon: [name], MD;  Location: [facility];  Service: Urology;  Laterality: N/A;     66    SEEDS IMPLANTED   REPAIR EXTENSOR TENDON Right 06/02/2016   Procedure: RIGHT LONG FINGER EXTENSOR TENDON REPAIR;  Surgeon: Milly Jakob, MD;  Location: [place];  Service: Orthopedics;  Laterality: Right;   TOTAL KNEE ARTHROPLASTY Right 2010  approx.   TOTAL KNEE ARTHROPLASTY Left 12/31/2020  Procedure: TOTAL KNEE ARTHROPLASTY;  Surgeon: [name], MD;  Location: [facility];  Service: Orthopedics;  Laterality: Left;   TRANSURETHRAL INCISION OF BLADDER NECK N/A 07/04/2019   Procedure: CYSTOSCOPY WITH INCISION OF BLADDER NECK CONTRACTURE, BALLOON DILITATION;  Surgeon: [name], MD;  Location: [facility];  Service: Urology;  Laterality: N/A;   TRANSURETHRAL INCISION OF BLADDER NECK N/A 08/01/2020   Procedure: INCISION OF BLADDER NECK;  Surgeon: [name], MD;  Location: [facility];  Service: Urology;  Laterality: N/A;  1 HR   TRANSURETHRAL RESECTION OF PROSTATE N/A 06/16/2018   Procedure: TRANSURETHRAL RESECTION OF THE PROSTATE (TURP);  Surgeon: [name],  MD;  Location: [facility];  Service: Urology;  Laterality: N/A;    Allergies  Allergies  Allergen Reactions   Morphine And Related Rash    History of Present Illness    Bradley Ray is a 65 y.o. male with ahx of hypertension, alcoholism, cocaine abuse in remission,hyperlipidemia, and hx of prostate cancer who presents today for bradycardia with frequent PVCs, and initiation of spironolactone for hypertension by Dr. Acie Fredrickson.  He was seen back in May 2022 by Dr. Marlou Porch for exertional shortness of breath.  He did have a history of coronary artery atherosclerosis and was being treated with aspirin, statin,.  A Lexiscan stress test was ordered at that time to rule out any evidence of high risk ischemia.  His previous coronary CT scan showed diffuse stents LAD, circumflex, and RCA calcifications.  There was also aortic atherosclerosis present.  He did endorse some PVCs at that time and these were noted on a previous EKG which was performed in the hospital.  He was told to continue Toprol.  His hypertension was well controlled at that time.  Lexi scan performed June 2022 was reassuring with no ST segment deviation noted during stress.  No significant ischemia was noted.  He did have moderate global hypokinesis with an EF estimated to be 43%.  He was then seen in the clinic by Dr. Acie Fredrickson since he was undergoing a colonoscopy.  He was found to be bradycardic during this appointment which was thought to be due to PVCs.  His potassium was noted to be 3.2 at that time.  On repeat BMP it was 4.3.  He was cleared for his colonoscopy and started on spironolactone 25 mg during this appointment.  Today he feels good without any chest pain. He does occassionally have some SOB when walking uphill but he has gained some weight recently which might be contributing. He has not had any palpitations or feeling any fluttering in his chest. Overall, he has been feeling well. He did have some high BP reading at home but his BP  today was 132/80. When it was checked back in September with Dr. Acie Fredrickson it was 140/80. This leads me to believe that his home cuff is inaccurate. We will plan to bring him in for a BP check in 1 month and I asked him to bring his cuff at this time.   Reports no chest pain, pressure, or tightness. No edema, orthopnea, PND. Reports no palpitations.     EKGs/Labs/Other Studies Reviewed:   The following studies were reviewed today:  Stress test 03/21/2021  The left ventricular ejection fraction is moderately decreased (30-44%). Nuclear stress EF: 43%. Moderate global hypokinesis. There was no ST segment deviation noted during stress. Defect 1: There is a small defect of mild severity present in the apex location. No ischemia. This  is an intermediate risk study based upon reduced calculated ejection fraction.    Echocardiogram January 01, 2021  IMPRESSIONS     1. Left ventricular ejection fraction, by estimation, is 60 to 65%. The  left ventricle has normal function. The left ventricle has no regional  wall motion abnormalities. Left ventricular diastolic parameters are  indeterminate.   2. Right ventricular systolic function is normal. The right ventricular  size is normal. Tricuspid regurgitation signal is inadequate for assessing  PA pressure.   3. The mitral valve is normal in structure. No evidence of mitral valve  regurgitation.   4. The aortic valve is tricuspid. Aortic valve regurgitation is not  visualized. No aortic stenosis is present.   5. The inferior vena cava is normal in size with greater than 50%  respiratory variability, suggesting right atrial pressure of 3 mmHg.   FINDINGS   Left Ventricle: Left ventricular ejection fraction, by estimation, is 60  to 65%. The left ventricle has normal function. The left ventricle has no  regional wall motion abnormalities. The left ventricular internal cavity  size was normal in size. There is   no left ventricular hypertrophy.  Left ventricular diastolic parameters  are indeterminate.   Right Ventricle: The right ventricular size is normal. No increase in  right ventricular wall thickness. Right ventricular systolic function is  normal. Tricuspid regurgitation signal is inadequate for assessing PA  pressure.   Left Atrium: Left atrial size was normal in size.   Right Atrium: Right atrial size was normal in size.   Pericardium: Trivial pericardial effusion is present.   Mitral Valve: The mitral valve is normal in structure. No evidence of  mitral valve regurgitation.   Tricuspid Valve: The tricuspid valve is normal in structure. Tricuspid  valve regurgitation is not demonstrated.   Aortic Valve: The aortic valve is tricuspid. Aortic valve regurgitation is  not visualized. No aortic stenosis is present.   Pulmonic Valve: The pulmonic valve was not well visualized. Pulmonic valve  regurgitation is not visualized.   Aorta: The aortic root and ascending aorta are structurally normal, with  no evidence of dilitation.   Venous: The inferior vena cava is normal in size with greater than 50%  respiratory variability, suggesting right atrial pressure of 3 mmHg.   IAS/Shunts: The interatrial septum was not well visualized.   EKG:  EKG is  ordered today.  The ekg ordered today demonstrates NSR in the 70s  Recent Labs: 01/01/2021: B Natriuretic Peptide 86.1; Hemoglobin 10.8; Magnesium 1.5; Platelets 161 07/02/2021: BUN 12; Creatinine, Ser 0.78; Potassium 4.3; Sodium 142  Recent Lipid Panel    Component Value Date/Time   CHOL 111 01/02/2021 0359   TRIG 63 01/02/2021 0359   HDL 48 01/02/2021 0359   CHOLHDL 2.3 01/02/2021 0359   VLDL 13 01/02/2021 0359   LDLCALC 50 01/02/2021 0359   Home Medications   Current Meds  Medication Sig   albuterol (PROVENTIL HFA;VENTOLIN HFA) 108 (90 Base) MCG/ACT inhaler Inhale 2 puffs into the lungs every 6 (six) hours as needed for wheezing or shortness of breath.   aspirin  EC 81 MG tablet Take 1 tablet (81 mg total) by mouth in the morning and at bedtime. To prevent blood clots after surgery   atorvastatin (LIPITOR) 40 MG tablet Take 1 tablet (40 mg total) by mouth daily.   folic acid (FOLVITE) 1 MG tablet Take 1 tablet (1 mg total) by mouth daily.   Ketotifen Fumarate (ALAWAY OP) Place 1 drop  into both eyes daily as needed (itchy eyes).   metoprolol succinate (TOPROL-XL) 50 MG 24 hr tablet Take 50 mg by mouth daily.   spironolactone (ALDACTONE) 25 MG tablet Take 1 tablet (25 mg total) by mouth daily.   tamsulosin (FLOMAX) 0.4 MG CAPS capsule 0.4 mg as needed.   thiamine (VITAMIN B-1) 100 MG tablet Take 100 mg by mouth daily.   traZODone (DESYREL) 50 MG tablet 1 tablet at bedtime as needed     Review of Systems      All other systems reviewed and are otherwise negative except as noted above.  Physical Exam    VS:  BP 132/80   Pulse 70   Ht 5\' 6"  (1.676 m)   Wt 174 lb 12.8 oz (79.3 kg)   SpO2 97%   BMI 28.21 kg/m  , BMI Body mass index is 28.21 kg/m.  Wt Readings from Last 3 Encounters:  09/01/21 174 lb 12.8 oz (79.3 kg)  06/13/21 172 lb 6.4 oz (78.2 kg)  03/21/21 174 lb (78.9 kg)     GEN: Well nourished, well developed, in no acute distress. HEENT: normal. Cardiac: RRR, no murmurs, rubs, or gallops. No clubbing, cyanosis, edema.  Radials/PT 2+ and equal bilaterally.  Respiratory:  Respirations regular and unlabored, clear to auscultation bilaterally. GI: Soft, nontender, nondistended. MS: No deformity or atrophy. Skin: Warm and dry, no rash. Neuro:  Strength and sensation are intact. Psych: Normal affect.  Assessment & Plan    Hypertension-continue Toprol-XL. Norvasc discontinued by another provider. Blood pressures at home have been as high as 163 systolic. Continue low-sodium diet. I suspect that his cuff is inaccurate. We will bring him back in 1 month for a BP check and I asked him to bring his cuff at this time.   CAD- Stress test  reassuring. No recent chest pain. Continue GDMT: asa, statin, BB.  Bradycardia with PVCs-EKG shows NSR in the 70s, no PVCs.  He is asymptomatic at this time.  Continue metoprolol XL 50 mg daily. Recent electrolytes 9/22 okay.   Hyperlipidemia-continue Lipitor 40 mg daily. Last LDL back in March was 50. LFTs elevated back in 01/2019. Will order labs at his next visit in 6 months (CMP, Lipids, LFTs)  CKD hx of hemodialysis-labs drawn 07/02/2021 with recent creatinine of 0.78.  Electrolytes within the normal range. He has not had any recent issues.   Disposition: Follow up in 6 month(s) with Candee Furbish, MD or APP.  Signed, Elgie Collard, PA-C 09/01/2021, 8:23 AM [facility]

## 2021-09-01 ENCOUNTER — Other Ambulatory Visit: Payer: Self-pay

## 2021-09-01 ENCOUNTER — Encounter: Payer: Self-pay | Admitting: Physician Assistant

## 2021-09-01 ENCOUNTER — Ambulatory Visit: Payer: Medicaid Other | Admitting: Physician Assistant

## 2021-09-01 VITALS — BP 132/80 | HR 70 | Ht 66.0 in | Wt 174.8 lb

## 2021-09-01 DIAGNOSIS — E782 Mixed hyperlipidemia: Secondary | ICD-10-CM | POA: Diagnosis not present

## 2021-09-01 DIAGNOSIS — I10 Essential (primary) hypertension: Secondary | ICD-10-CM | POA: Diagnosis not present

## 2021-09-01 DIAGNOSIS — R001 Bradycardia, unspecified: Secondary | ICD-10-CM

## 2021-09-01 DIAGNOSIS — I2510 Atherosclerotic heart disease of native coronary artery without angina pectoris: Secondary | ICD-10-CM | POA: Diagnosis not present

## 2021-09-01 NOTE — Patient Instructions (Addendum)
Medication Instructions:   Your physician recommends that you continue on your current medications as directed. Please refer to the Current Medication list given to you today.  *If you need a refill on your cardiac medications before your next appointment, please call your pharmacy*   Lab Work:  Your physician recommends that you return for a FASTING lipid profile/cmet/lft. You can come in on Wednesday, May 10 the day of appointment between 7:30-4:30 fasting from midnight the night before.    If you have labs (blood work) drawn today and your tests are completely normal, you will receive your results only by: [place] (if you have MyChart) OR A paper copy in the mail If you have any lab test that is abnormal or we need to change your treatment, we will call you to review the results.   Testing/Procedures:  -NONE   Follow-Up: At [facility], you and your health needs are our priority.  As part of our continuing mission to provide you with exceptional heart care, we have created designated Provider Care Teams.  These Care Teams include your primary Cardiologist (physician) and Advanced Practice Providers (APPs -  Physician Assistants and Nurse Practitioners) who all work together to provide you with the care you need, when you need it.  We recommend signing up for the patient portal called "MyChart".  Sign up information is provided on this After Visit Summary.  MyChart is used to connect with patients for Virtual Visits (Telemedicine).  Patients are able to view lab/test results, encounter notes, upcoming appointments, etc.  Non-urgent messages can be sent to your provider as well.   To learn more about what you can do with MyChart, go to [email or web address].    Your next appointment:   6 month(s)  The format for your next appointment:   In Person  Provider:   Candee Furbish, MD     Other Instructions  You have a nurse visit on  Wednesday, December 14 at 11:00 to  get your blood pressure cuff checked.

## 2021-09-24 ENCOUNTER — Ambulatory Visit (INDEPENDENT_AMBULATORY_CARE_PROVIDER_SITE_OTHER): Payer: Medicaid Other | Admitting: *Deleted

## 2021-09-24 ENCOUNTER — Other Ambulatory Visit: Payer: Self-pay

## 2021-09-24 VITALS — BP 150/78 | Ht 66.0 in | Wt 173.0 lb

## 2021-09-24 DIAGNOSIS — I10 Essential (primary) hypertension: Secondary | ICD-10-CM | POA: Diagnosis not present

## 2021-09-24 NOTE — Patient Instructions (Signed)
Medication Instructions:  Your physician recommends that you continue on your current medications as directed. Please refer to the Current Medication list given to you today. *If you need a refill on your cardiac medications before your next appointment, please call your pharmacy*  Lab Work: None. If you have labs (blood work) drawn today and your tests are completely normal, you will receive your results only by: [place] (if you have MyChart) OR A paper copy in the mail If you have any lab test that is abnormal or we need to change your treatment, we will call you to review the results.  Testing/Procedures: None.  Follow-Up: At [facility], you and your health needs are our priority.  As part of our continuing mission to provide you with exceptional heart care, we have created designated Provider Care Teams.  These Care Teams include your primary Cardiologist (physician) and Advanced Practice Providers (APPs -  Physician Assistants and Nurse Practitioners) who all work together to provide you with the care you need, when you need it.  We recommend signing up for the patient portal called "MyChart".  Sign up information is provided on this After Visit Summary.  MyChart is used to connect with patients for Virtual Visits (Telemedicine).  Patients are able to view lab/test results, encounter notes, upcoming appointments, etc.  Non-urgent messages can be sent to your provider as well.   To learn more about what you can do with MyChart, go to [email or web address].    Any Other Special Instructions Will Be Listed Below (If Applicable).

## 2021-09-24 NOTE — Progress Notes (Signed)
Reason for visit: BP check  Name of MD requesting visit: Conte, Tessa N, [place]  H&P: He did have some high BP reading at home but his BP today was 132/80. When it was checked back in September with Dr. Acie Fredrickson it was 140/80. This leads me to believe that his home cuff is inaccurate. We will plan to bring him in for a BP check in 1 month and I asked him to bring his cuff at this time.   ROS related to problem:  Home BP Cuff reading: 156/87 67 educated on placement of artery marker on BP cuff, when first placed by patient this was not correct (on back of arm).  Office BP reading: 150/78  Assessment and plan per MD: Continue to monitor, no changes to medication at this time. Will route visit to [facility] for further advisement.

## 2021-09-25 ENCOUNTER — Telehealth: Payer: Self-pay | Admitting: *Deleted

## 2021-09-25 NOTE — Telephone Encounter (Signed)
Patient returning call.

## 2021-09-25 NOTE — Telephone Encounter (Signed)
Left message to call back  

## 2021-09-25 NOTE — Telephone Encounter (Signed)
Spoke with the patient and he does not keep a record of his blood pressure readings. He will start doing this daily and call us next week with a list of readings.

## 2021-09-25 NOTE — Telephone Encounter (Signed)
Is he keeping a log of his Bps at home? If so, is he able to send me the log via MyChart?   If he is consistently in the 150s we will have to adjust his medications.    Thanks!   Johann Capers

## 2021-10-08 NOTE — Telephone Encounter (Signed)
° °  Pt c/o BP issue: STAT if pt c/o blurred vision, one-sided weakness or slurred speech  1. What are your last 5 BP readings?  09/28/21: 149/83  09/29/21: 138/85 09/30/21: 140/83 10/01/21: 118/74 10/02/21: 112/64 10/03/21: 141/84 10/04/21: 120/86 10/05/21: 143/74  2. Are you having any other symptoms (ex. Dizziness, headache, blurred vision, passed out)?   3. What is your BP issue? Patient recorded BP  as directed and was calling back to report them to the office

## 2021-10-14 NOTE — Telephone Encounter (Signed)
Elgie Collard, PA-C to [place], Stephanie Coup, RN      6:11 PM These readings are pretty good. A few are on the high end. I would encourage the patient to take their BP at the same time each day and allow 5-10 minutes of sitting/relaxing before taking BP. Also, allow for at least a hour after BP medications taken in the morning before taking reading.   No medication changes at this time. Continue low-sodium diet.   Thanks!   Tessa   Patient notified.  Bradley reports heart rate is usually 69-72.  One time recently it was 21. Bradley was feeling fine.  I asked him to also check heart rate when checking BP and let us know if consistently running less than 60.

## 2021-10-25 ENCOUNTER — Other Ambulatory Visit: Payer: Self-pay

## 2021-10-25 ENCOUNTER — Encounter (HOSPITAL_COMMUNITY): Payer: Self-pay | Admitting: Emergency Medicine

## 2021-10-25 ENCOUNTER — Inpatient Hospital Stay (HOSPITAL_COMMUNITY)
Admission: EM | Admit: 2021-10-25 | Discharge: 2021-10-31 | DRG: 441 | Disposition: A | Discharge status: Home / Self Care | Payer: Medicaid Other | Attending: Family Medicine | Admitting: Family Medicine

## 2021-10-25 ENCOUNTER — Emergency Department (HOSPITAL_COMMUNITY): Payer: Medicaid Other

## 2021-10-25 DIAGNOSIS — N1411 Contrast-induced nephropathy: Secondary | ICD-10-CM | POA: Diagnosis present

## 2021-10-25 DIAGNOSIS — Z20822 Contact with and (suspected) exposure to covid-19: Secondary | ICD-10-CM | POA: Diagnosis present

## 2021-10-25 DIAGNOSIS — R7989 Other specified abnormal findings of blood chemistry: Secondary | ICD-10-CM | POA: Diagnosis present

## 2021-10-25 DIAGNOSIS — Z96653 Presence of artificial knee joint, bilateral: Secondary | ICD-10-CM | POA: Diagnosis present

## 2021-10-25 DIAGNOSIS — E8809 Other disorders of plasma-protein metabolism, not elsewhere classified: Secondary | ICD-10-CM | POA: Diagnosis present

## 2021-10-25 DIAGNOSIS — D6959 Other secondary thrombocytopenia: Secondary | ICD-10-CM | POA: Diagnosis present

## 2021-10-25 DIAGNOSIS — R7401 Elevation of levels of liver transaminase levels: Secondary | ICD-10-CM | POA: Diagnosis present

## 2021-10-25 DIAGNOSIS — E785 Hyperlipidemia, unspecified: Secondary | ICD-10-CM | POA: Diagnosis present

## 2021-10-25 DIAGNOSIS — Z923 Personal history of irradiation: Secondary | ICD-10-CM

## 2021-10-25 DIAGNOSIS — T508X5A Adverse effect of diagnostic agents, initial encounter: Secondary | ICD-10-CM | POA: Diagnosis present

## 2021-10-25 DIAGNOSIS — Z8616 Personal history of COVID-19: Secondary | ICD-10-CM

## 2021-10-25 DIAGNOSIS — Z808 Family history of malignant neoplasm of other organs or systems: Secondary | ICD-10-CM

## 2021-10-25 DIAGNOSIS — R748 Abnormal levels of other serum enzymes: Secondary | ICD-10-CM

## 2021-10-25 DIAGNOSIS — N170 Acute kidney failure with tubular necrosis: Secondary | ICD-10-CM | POA: Diagnosis present

## 2021-10-25 DIAGNOSIS — Z800 Family history of malignant neoplasm of digestive organs: Secondary | ICD-10-CM

## 2021-10-25 DIAGNOSIS — I10 Essential (primary) hypertension: Secondary | ICD-10-CM | POA: Diagnosis present

## 2021-10-25 DIAGNOSIS — R109 Unspecified abdominal pain: Secondary | ICD-10-CM

## 2021-10-25 DIAGNOSIS — D563 Thalassemia minor: Secondary | ICD-10-CM | POA: Diagnosis present

## 2021-10-25 DIAGNOSIS — Z8546 Personal history of malignant neoplasm of prostate: Secondary | ICD-10-CM

## 2021-10-25 DIAGNOSIS — R571 Hypovolemic shock: Secondary | ICD-10-CM | POA: Diagnosis present

## 2021-10-25 DIAGNOSIS — Z7982 Long term (current) use of aspirin: Secondary | ICD-10-CM

## 2021-10-25 DIAGNOSIS — Z79899 Other long term (current) drug therapy: Secondary | ICD-10-CM

## 2021-10-25 DIAGNOSIS — I5032 Chronic diastolic (congestive) heart failure: Secondary | ICD-10-CM | POA: Diagnosis present

## 2021-10-25 DIAGNOSIS — R519 Headache, unspecified: Secondary | ICD-10-CM | POA: Diagnosis present

## 2021-10-25 DIAGNOSIS — K7011 Alcoholic hepatitis with ascites: Secondary | ICD-10-CM | POA: Diagnosis present

## 2021-10-25 DIAGNOSIS — E872 Acidosis, unspecified: Secondary | ICD-10-CM | POA: Diagnosis present

## 2021-10-25 DIAGNOSIS — D509 Iron deficiency anemia, unspecified: Secondary | ICD-10-CM | POA: Diagnosis present

## 2021-10-25 DIAGNOSIS — Z87891 Personal history of nicotine dependence: Secondary | ICD-10-CM

## 2021-10-25 DIAGNOSIS — Z832 Family history of diseases of the blood and blood-forming organs and certain disorders involving the immune mechanism: Secondary | ICD-10-CM

## 2021-10-25 DIAGNOSIS — K7200 Acute and subacute hepatic failure without coma: Principal | ICD-10-CM | POA: Diagnosis present

## 2021-10-25 DIAGNOSIS — N179 Acute kidney failure, unspecified: Secondary | ICD-10-CM

## 2021-10-25 DIAGNOSIS — Z8619 Personal history of other infectious and parasitic diseases: Secondary | ICD-10-CM

## 2021-10-25 DIAGNOSIS — N3090 Cystitis, unspecified without hematuria: Secondary | ICD-10-CM | POA: Diagnosis present

## 2021-10-25 DIAGNOSIS — J45909 Unspecified asthma, uncomplicated: Secondary | ICD-10-CM | POA: Diagnosis present

## 2021-10-25 DIAGNOSIS — R1013 Epigastric pain: Secondary | ICD-10-CM | POA: Diagnosis present

## 2021-10-25 DIAGNOSIS — Z9079 Acquired absence of other genital organ(s): Secondary | ICD-10-CM

## 2021-10-25 DIAGNOSIS — E860 Dehydration: Secondary | ICD-10-CM | POA: Diagnosis present

## 2021-10-25 DIAGNOSIS — E876 Hypokalemia: Secondary | ICD-10-CM | POA: Diagnosis present

## 2021-10-25 DIAGNOSIS — Z885 Allergy status to narcotic agent status: Secondary | ICD-10-CM

## 2021-10-25 DIAGNOSIS — E8729 Other acidosis: Secondary | ICD-10-CM | POA: Diagnosis present

## 2021-10-25 DIAGNOSIS — N12 Tubulo-interstitial nephritis, not specified as acute or chronic: Secondary | ICD-10-CM | POA: Diagnosis present

## 2021-10-25 DIAGNOSIS — E871 Hypo-osmolality and hyponatremia: Secondary | ICD-10-CM | POA: Diagnosis present

## 2021-10-25 DIAGNOSIS — I110 Hypertensive heart disease with heart failure: Secondary | ICD-10-CM | POA: Diagnosis present

## 2021-10-25 DIAGNOSIS — K760 Fatty (change of) liver, not elsewhere classified: Secondary | ICD-10-CM | POA: Diagnosis present

## 2021-10-25 DIAGNOSIS — F1010 Alcohol abuse, uncomplicated: Secondary | ICD-10-CM | POA: Diagnosis present

## 2021-10-25 LAB — TSH: TSH: 0.521 u[IU]/mL (ref 0.350–4.500)

## 2021-10-25 LAB — CBC
HCT: 36.6 % — ABNORMAL LOW (ref 39.0–52.0)
Hemoglobin: 12 g/dL — ABNORMAL LOW (ref 13.0–17.0)
MCH: 21.5 pg — ABNORMAL LOW (ref 26.0–34.0)
MCHC: 32.8 g/dL (ref 30.0–36.0)
MCV: 65.5 fL — ABNORMAL LOW (ref 80.0–100.0)
Platelets: 196 K/uL (ref 150–400)
RBC: 5.59 MIL/uL (ref 4.22–5.81)
RDW: 17.2 % — ABNORMAL HIGH (ref 11.5–15.5)
WBC: 4.1 K/uL (ref 4.0–10.5)
nRBC: 2.2 % — ABNORMAL HIGH (ref 0.0–0.2)

## 2021-10-25 LAB — D-DIMER, QUANTITATIVE: D-Dimer, Quant: 4.8 ug/mL-FEU — ABNORMAL HIGH (ref 0.00–0.50)

## 2021-10-25 LAB — COMPREHENSIVE METABOLIC PANEL WITH GFR
ALT: 1356 U/L — ABNORMAL HIGH (ref 0–44)
AST: 3839 U/L — ABNORMAL HIGH (ref 15–41)
Albumin: 3.5 g/dL (ref 3.5–5.0)
Alkaline Phosphatase: 97 U/L (ref 38–126)
Anion gap: 20 — ABNORMAL HIGH (ref 5–15)
BUN: 15 mg/dL (ref 8–23)
CO2: 14 mmol/L — ABNORMAL LOW (ref 22–32)
Calcium: 7.9 mg/dL — ABNORMAL LOW (ref 8.9–10.3)
Chloride: 99 mmol/L (ref 98–111)
Creatinine, Ser: 0.91 mg/dL (ref 0.61–1.24)
GFR, Estimated: >60 mL/min
Glucose, Bld: 73 mg/dL (ref 70–99)
Potassium: 3.1 mmol/L — ABNORMAL LOW (ref 3.5–5.1)
Sodium: 133 mmol/L — ABNORMAL LOW (ref 135–145)
Total Bilirubin: 2 mg/dL — ABNORMAL HIGH (ref 0.3–1.2)
Total Protein: 6.1 g/dL — ABNORMAL LOW (ref 6.5–8.1)

## 2021-10-25 LAB — T4, FREE: Free T4: 0.85 ng/dL (ref 0.61–1.12)

## 2021-10-25 LAB — TROPONIN I (HIGH SENSITIVITY)
Troponin I (High Sensitivity): 10 ng/L (ref <18)
Troponin I (High Sensitivity): 15 ng/L (ref <18)

## 2021-10-25 LAB — PROTIME-INR
INR: 1.4 — ABNORMAL HIGH (ref 0.8–1.2)
Prothrombin Time: 17.4 seconds — ABNORMAL HIGH (ref 11.4–15.2)

## 2021-10-25 LAB — HEPATITIS PANEL, ACUTE
HCV Ab: NONREACTIVE
Hep A IgM: NONREACTIVE
Hep B C IgM: NONREACTIVE
Hepatitis B Surface Ag: NONREACTIVE

## 2021-10-25 LAB — LIPASE, BLOOD: Lipase: 30 U/L (ref 11–51)

## 2021-10-25 LAB — ETHANOL: Alcohol, Ethyl (B): 38 mg/dL — ABNORMAL HIGH (ref <10)

## 2021-10-25 MED ORDER — LACTATED RINGERS IV BOLUS
1000.0000 mL | Freq: Once | INTRAVENOUS | Status: AC
  Administered 2021-10-25: 1000 mL via INTRAVENOUS

## 2021-10-25 MED ORDER — POTASSIUM CHLORIDE CRYS ER 20 MEQ PO TBCR
40.0000 meq | EXTENDED_RELEASE_TABLET | Freq: Once | ORAL | Status: AC
  Administered 2021-10-25: 40 meq via ORAL
  Filled 2021-10-25: qty 2

## 2021-10-25 MED ORDER — IOHEXOL 350 MG/ML SOLN
80.0000 mL | Freq: Once | INTRAVENOUS | Status: AC | PRN
  Administered 2021-10-25: 80 mL via INTRAVENOUS

## 2021-10-25 MED ORDER — KETOROLAC TROMETHAMINE 15 MG/ML IJ SOLN
15.0000 mg | Freq: Once | INTRAMUSCULAR | Status: AC
Start: 2021-10-25 — End: 2021-10-25
  Administered 2021-10-25: 15 mg via INTRAVENOUS
  Filled 2021-10-25: qty 1

## 2021-10-25 MED ORDER — FAMOTIDINE IN NACL 20-0.9 MG/50ML-% IV SOLN
20.0000 mg | Freq: Once | INTRAVENOUS | Status: AC
  Administered 2021-10-26: 20 mg via INTRAVENOUS
  Filled 2021-10-25: qty 50

## 2021-10-25 MED ORDER — ALUM & MAG HYDROXIDE-SIMETH 200-200-20 MG/5ML PO SUSP
30.0000 mL | Freq: Once | ORAL | Status: AC
  Administered 2021-10-25: 30 mL via ORAL
  Filled 2021-10-25: qty 30

## 2021-10-25 MED ORDER — SODIUM CHLORIDE 0.9 % IV BOLUS
1000.0000 mL | Freq: Once | INTRAVENOUS | Status: AC
  Administered 2021-10-25: 1000 mL via INTRAVENOUS

## 2021-10-25 MED ORDER — LIDOCAINE VISCOUS HCL 2 % MT SOLN
15.0000 mL | Freq: Once | OROMUCOSAL | Status: AC
  Administered 2021-10-25: 15 mL via ORAL
  Filled 2021-10-25: qty 15

## 2021-10-25 NOTE — ED Notes (Signed)
Patient transported to CT 

## 2021-10-25 NOTE — ED Triage Notes (Addendum)
Pt to [facility] via [facility] from home.  Reports substernal chest pain x 2 hours.  Initially rated pain 8/10.  Took 4 baby ASA prior to EMS arrival.  Denies SOB, nausea, and vomiting.  Pt grimacing in pain on arrival.  EMS- Initial BP 142/80 1 SL NTG given Repeat BP 82/50 Pain on arrival 9/10 IV- 18g R hand NS 350cc bolus  Recently seen by cardiologist for PVCs.

## 2021-10-25 NOTE — ED Notes (Signed)
Charge RN aware of need for room.  Pt to remain in triage until room ready.

## 2021-10-25 NOTE — ED Notes (Signed)
Pt NAD in bed, a/ox4. States he developed gradual epigastric CP while watching TV at 3pm. Cannot describe quality of pain. Currently 3/10. Denies ever having SOB, n/v, dizziness, syncope or sweatiness. VSS

## 2021-10-25 NOTE — ED Provider Notes (Signed)
[facility] EMERGENCY DEPARTMENT Provider Note   CSN: [id number] Arrival date & time: 10/25/21  1819     History  Chief Complaint  Patient presents with   Chest Pain    Bradley Ray is a 66 y.o. male.  This is a 66 y.o. male with significant medical history as below, including Diastolic CHF, BPH, substance abuse who presents to the ED with complaint of midepigastric/chest pain.  Patient reports sudden onset of pain earlier today.  Described as a squeezing, tightness/burning sensation.   Pain is not radiating.    Location: Mid epigastrium Duration: Less than 12 hours Onset: Sudden Timing: Constant, worsening Description: See above Severity: Mild Exacerbating/Alleviating Factors: Unable to identify Associated Symptoms: Mild nausea Pertinent Negatives: No chest pain, dyspnea, lightheadedness, diaphoresis, change in bowel or bladder function.  No recent illicit drug use.  No IV drug use.  No recent alcohol use.  No excessive NSAID use.  No melena.  Context: Patient reports last time he experienced similar discomfort he had to be "put on dialysis."  Patient was given nitroglycerin/ASA by EMS and blood pressure precipitously fell.  Pain did not improve after nitroglycerin.   The history is provided by the patient. No language interpreter was used.  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/04/2019   Hypertriglyceridemia 01/07/2019   Chest pain 01/07/2019   Chronic diastolic CHF (congestive heart failure) ([place]) 01/07/2019   AKI (acute kidney injury) ([place]) 01/07/2019   Shock liver 01/07/2019   BPH with urinary obstruction 06/16/2018   Malignant neoplasm of prostate  ([place]) 08/10/2016   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 Authorizing Provider  albuterol (PROVENTIL HFA;VENTOLIN HFA) 108 (90 Base) MCG/ACT inhaler Inhale 2 puffs into the lungs every 6 (six) hours as needed for wheezing or shortness of breath.   Yes [provider]  amLODipine (NORVASC) 5 MG tablet Take 5 mg by mouth daily.   Yes [provider]  aspirin EC 81 MG tablet Take 1 tablet (81 mg total) by mouth in the morning and at bedtime. To prevent blood clots after surgery Patient taking differently: Take 81 mg by mouth in the morning. 12/31/20  Yes Gawne, Meghan M, PA-C  atorvastatin (LIPITOR) 40 MG tablet Take 1 tablet (40 mg total) by mouth daily. 11/28/15  Yes Kelvin Cellar, MD  folic acid (FOLVITE) 1 MG tablet Take 1 tablet (1 mg total) by mouth daily. 01/19/19  Yes Antonieta Pert, MD  metoprolol succinate (TOPROL-XL) 50 MG 24 hr tablet Take 50 mg by mouth daily. 01/28/21  Yes [provider]  Thiamine HCl (VITAMIN B1) 100 MG TABS Take 100 mg by mouth in the morning.   Yes [provider]  spironolactone (ALDACTONE) 25 MG tablet Take 1 tablet (25 mg total) by mouth daily. 06/13/21   Nahser, Wonda Cheng, MD  thiamine (VITAMIN B-1) 100 MG tablet Take 100 mg by mouth daily.    [provider]  colchicine 0.6 MG tablet Take 0.5 tablets (0.3 mg total) by mouth 2 (two) times a week for 8 doses. Patient not taking: Reported on 09/27/2019 01/19/19 09/27/19  Antonieta Pert, MD      Allergies    Morphine and related    Review of Systems   Review of Systems  Constitutional:  Negative for chills, fatigue and fever.  HENT:  Negative for facial swelling and trouble swallowing.   Eyes:  Negative for photophobia  and visual disturbance.  Respiratory:  Negative for cough and shortness of breath.   Cardiovascular:  Positive for chest pain. Negative for palpitations.  Gastrointestinal:  Positive for abdominal pain and nausea. Negative for anal bleeding, blood in stool, constipation, diarrhea and vomiting.  Endocrine: Negative for polydipsia and polyuria.  Genitourinary:  Negative for difficulty urinating and hematuria.  Musculoskeletal:  Negative for gait problem and joint swelling.  Skin:  Negative for pallor and rash.  Neurological:  Negative for syncope and headaches.  Psychiatric/Behavioral:  Negative for agitation and confusion.    Physical Exam Updated Vital Signs BP (!) 144/83    Pulse 89    Temp 97.6 F (36.4 C)    Resp (!) 28    SpO2 99%  Physical Exam Vitals and nursing note reviewed.  Constitutional:      General: He is in acute distress (2/2 pain).     Appearance: He is well-developed. He is not toxic-appearing or diaphoretic.  HENT:     Head: Normocephalic and atraumatic.     Right Ear: External ear normal.     Left Ear: External ear normal.     Mouth/Throat:     Mouth: Mucous membranes are moist.  Eyes:     General: No scleral icterus. Cardiovascular:     Rate and Rhythm: Normal rate and regular rhythm.     Pulses: Normal pulses.          Radial pulses are 2+ on the right side and 2+ on the left side.       Dorsalis pedis pulses are 2+ on the right side and 2+ on the left side.     Heart sounds: Normal heart sounds.  Pulmonary:     Effort: Pulmonary effort is normal. No respiratory distress.     Breath sounds: Normal breath sounds.  Abdominal:     General: Abdomen is flat.     Palpations: Abdomen is soft.     Tenderness: There is abdominal tenderness in the right upper quadrant and epigastric area.    Musculoskeletal:        General: Normal range of motion.     Cervical back: Normal range of motion.     Right lower leg: No edema.     Left lower leg: No edema.   Skin:    General: Skin is warm and dry.     Capillary Refill: Capillary refill takes less than 2 seconds.  Neurological:     Mental Status: He is alert and oriented to person, place, and time.     GCS: GCS eye subscore is 4. GCS verbal subscore is 5. GCS motor subscore is 6.     Cranial Nerves: Cranial nerves 2-12 are intact.     Sensory: Sensation is intact.     Motor: Motor function is intact.     Coordination: Coordination is intact.     Gait: Gait is intact.  Psychiatric:        Mood and Affect: Mood normal.  Behavior: Behavior normal.    ED Results / Procedures / Treatments   Labs (all labs ordered are listed, but only abnormal results are displayed) Labs Reviewed  CBC - Abnormal; Notable for the following components:      Result Value   Hemoglobin 12.0 (*)    HCT 36.6 (*)    MCV 65.5 (*)    MCH 21.5 (*)    RDW 17.2 (*)    nRBC 2.2 (*)    All other components within normal limits  COMPREHENSIVE METABOLIC PANEL - Abnormal; Notable for the following components:   Sodium 133 (*)    Potassium 3.1 (*)    CO2 14 (*)    Calcium 7.9 (*)    Total Protein 6.1 (*)    AST 3,839 (*)    ALT 1,356 (*)    Total Bilirubin 2.0 (*)    Anion gap 20 (*)    All other components within normal limits  D-DIMER, QUANTITATIVE (NOT AT [facility])  TSH  T4, FREE  RAPID URINE DRUG SCREEN, HOSP PERFORMED  LIPASE, BLOOD  HEPATITIS PANEL, ACUTE  ETHANOL  PROTIME-INR  URINALYSIS, ROUTINE W REFLEX MICROSCOPIC  TROPONIN I (HIGH SENSITIVITY)  TROPONIN I (HIGH SENSITIVITY)    EKG EKG Interpretation  Date/Time:  Saturday October 25 2021 18:25:03 EST Ventricular Rate:  85 PR Interval:  194 QRS Duration: 98 QT Interval:  426 QTC Calculation: 506 R Axis:   -8 Text Interpretation: Normal sinus rhythm Prolonged QT Abnormal ECG When compared with ECG of 01-Jan-2021 06:29, PREVIOUS ECG IS PRESENT similar to prior Confirmed by Wynona Dove ([id number]) on 10/25/2021 6:41:11 PM  Radiology DG Chest 2  View  Result Date: 10/25/2021 CLINICAL DATA:  Chest pain EXAM: CHEST - 2 VIEW COMPARISON:  01/01/2021 FINDINGS: The heart size and mediastinal contours are within normal limits. Both lungs are clear. The visualized skeletal structures are unremarkable. IMPRESSION: No active cardiopulmonary disease. Electronically Signed   By: Inez Catalina M.D.   On: 10/25/2021 21:20   CT Angio Chest/Abd/Pel for Dissection W and/or Wo Contrast  Result Date: 10/25/2021 CLINICAL DATA:  Chest pain with mid epigastric pain. EXAM: CT ANGIOGRAPHY CHEST, ABDOMEN AND PELVIS TECHNIQUE: Non-contrast CT of the chest was initially obtained. Multidetector CT imaging through the chest, abdomen and pelvis was performed using the standard protocol during bolus administration of intravenous contrast. Multiplanar reconstructed images and MIPs were obtained and reviewed to evaluate the vascular anatomy. RADIATION DOSE REDUCTION: This exam was performed according to the departmental dose-optimization program which includes automated exposure control, adjustment of the mA and/or kV according to patient size and/or use of iterative reconstruction technique. CONTRAST:  27mL OMNIPAQUE IOHEXOL 350 MG/ML SOLN COMPARISON:  January 01, 2021 FINDINGS: CTA CHEST FINDINGS Cardiovascular: There is very mild calcification of the aortic arch, without evidence of aortic aneurysm or dissection. Satisfactory opacification of the pulmonary arteries to the segmental level. No evidence of pulmonary embolism. Normal heart size with moderate to marked severity coronary artery calcification. No pericardial effusion. Mediastinum/Nodes: No enlarged mediastinal, hilar, or axillary lymph nodes. Thyroid gland, trachea, and esophagus demonstrate no significant findings. Lungs/Pleura: A small, stable, thin walled parenchymal cyst is seen within the posteromedial aspect of the right middle lobe. There is no evidence of acute infiltrate, pleural effusion or pneumothorax.  Musculoskeletal: No chest wall abnormality. No acute or significant osseous findings. Review of the MIP images confirms the above findings. CTA ABDOMEN AND PELVIS FINDINGS VASCULAR Aorta: Normal caliber aorta without aneurysm, dissection, vasculitis or significant  stenosis. Celiac: Patent without evidence of aneurysm, dissection, vasculitis or significant stenosis. SMA: Patent without evidence of aneurysm, dissection, vasculitis or significant stenosis. Renals: Both renal arteries are patent without evidence of aneurysm, dissection, vasculitis, fibromuscular dysplasia or significant stenosis. IMA: Patent without evidence of aneurysm, dissection, vasculitis or significant stenosis. Inflow: Patent without evidence of aneurysm, dissection, vasculitis or significant stenosis. Veins: No obvious venous abnormality within the limitations of this arterial phase study. Review of the MIP images confirms the above findings. NON-VASCULAR Hepatobiliary: There is mild, diffuse fatty infiltration of the liver parenchyma. No focal liver abnormality is seen. No gallstones, gallbladder wall thickening, or biliary dilatation. Pancreas: Unremarkable. No pancreatic ductal dilatation or surrounding inflammatory changes. Spleen: Normal in size without focal abnormality. Adrenals/Urinary Tract: There is mild diffuse enlargement of the left adrenal gland. The right adrenal gland is unremarkable. Kidneys are normal in size, without obstructing renal calculi, focal lesion, or hydronephrosis. A 3 mm nonobstructing renal calculus is seen within the mid to lower left kidney. Mild, bilateral perinephric inflammatory fat stranding is noted. This represents a new finding. There is mild to moderate severity diffuse urinary bladder wall thickening with a mild amount of surrounding inflammatory fat stranding. Stomach/Bowel: Stomach is within normal limits. Appendix appears normal. No evidence of bowel wall thickening, distention, or inflammatory  changes. Lymphatic: No abnormal abdominal or pelvic lymph nodes are identified. Reproductive: Multiple prostate radiation implantation seeds are seen. Other: No abdominal wall hernia or abnormality. No abdominopelvic ascites. Musculoskeletal: No acute or significant osseous findings. Review of the MIP images confirms the above findings. IMPRESSION: 1. No evidence of aortic aneurysm or dissection. 2. Moderate to marked severity coronary artery calcification. 3. Mild, diffuse fatty infiltration of the liver parenchyma. 4. Findings suggestive of mild to moderate severity cystitis and acute pyelonephritis. Correlation with urinalysis is recommended. 5. 3 mm nonobstructing left renal calculus. 6. Multiple prostate radiation implantation seeds. Aortic Atherosclerosis (ICD10-I70.0). Electronically Signed   By: Virgina Norfolk M.D.   On: 10/25/2021 21:11    Procedures Procedures    Medications Ordered in ED Medications  famotidine (PEPCID) IVPB 20 mg premix (has no administration in time range)  lactated ringers bolus 1,000 mL (has no administration in time range)  ketorolac (TORADOL) 15 MG/ML injection 15 mg (has no administration in time range)  sodium chloride 0.9 % bolus 1,000 mL (0 mLs Intravenous Stopped 10/25/21 2125)  alum & mag hydroxide-simeth (MAALOX/MYLANTA) 200-200-20 MG/5ML suspension 30 mL (30 mLs Oral Given 10/25/21 1930)    And  lidocaine (XYLOCAINE) 2 % viscous mouth solution 15 mL (15 mLs Oral Given 10/25/21 1930)  iohexol (OMNIPAQUE) 350 MG/ML injection 80 mL (80 mLs Intravenous Contrast Given 10/25/21 2052)    ED Course/ Medical Decision Making/ A&P                           Medical Decision Making   CC: Mid-epigastric pain  This patient complains of above; this involves an extensive number of treatment options and is a complaint that carries with it a high risk of complications and morbidity. Vital signs were reviewed. Serious etiologies considered.  Record review:  Previous  records obtained and reviewed   Work up as above, notable for:  Labs & imaging results that were available during my care of the patient were reviewed by me and considered in my medical decision making.   I ordered imaging studies which included CT dissection, CXR; imaging pending at time of handoff.  CXR was stable. Reviewed by myself.   EKG reviewed by myself, no STEMI.  Cardiac monitoring reviewed by myself demonstrates normal sinus rhythm  LFT's acutely elevated, hepatic function panel and INR pending. No recent ETOH use, no recent illicit substance use.   Pain improved after GI cocktail, hemodynamics improving after IVF. Hypotension likely 2/2 NTG  Mild hypokalemia, replaced  Management: Given GI cocktail, IVF    Pt pending remainder of labs at this time, also CT imaging; pt is HDS. Pain well controlled. Signed out to incoming EDP at time of shift handoff.  Pt will likely require admission given acutely elevated LFT's; pending CT.     Cardiac monitoring reviewed by myself shows NSR.     This chart was dictated using voice recognition software.  Despite best efforts to proofread,  errors can occur which can change the documentation meaning.         Final Clinical Impression(s) / ED Diagnoses Final diagnoses:  Abdominal pain, unspecified abdominal location  Elevated liver enzymes    Rx / DC Orders ED Discharge Orders     None         Jeanell Sparrow, DO 10/25/21 2136

## 2021-10-26 ENCOUNTER — Observation Stay (HOSPITAL_COMMUNITY): Payer: Medicaid Other

## 2021-10-26 ENCOUNTER — Encounter (HOSPITAL_COMMUNITY): Payer: Self-pay | Admitting: Internal Medicine

## 2021-10-26 DIAGNOSIS — R571 Hypovolemic shock: Secondary | ICD-10-CM | POA: Diagnosis present

## 2021-10-26 DIAGNOSIS — K760 Fatty (change of) liver, not elsewhere classified: Secondary | ICD-10-CM | POA: Diagnosis present

## 2021-10-26 DIAGNOSIS — D509 Iron deficiency anemia, unspecified: Secondary | ICD-10-CM | POA: Diagnosis present

## 2021-10-26 DIAGNOSIS — N12 Tubulo-interstitial nephritis, not specified as acute or chronic: Secondary | ICD-10-CM | POA: Diagnosis present

## 2021-10-26 DIAGNOSIS — Z20822 Contact with and (suspected) exposure to covid-19: Secondary | ICD-10-CM | POA: Diagnosis present

## 2021-10-26 DIAGNOSIS — K7011 Alcoholic hepatitis with ascites: Secondary | ICD-10-CM | POA: Diagnosis present

## 2021-10-26 DIAGNOSIS — E8729 Other acidosis: Secondary | ICD-10-CM

## 2021-10-26 DIAGNOSIS — K7200 Acute and subacute hepatic failure without coma: Secondary | ICD-10-CM | POA: Diagnosis present

## 2021-10-26 DIAGNOSIS — E871 Hypo-osmolality and hyponatremia: Secondary | ICD-10-CM | POA: Diagnosis present

## 2021-10-26 DIAGNOSIS — E876 Hypokalemia: Secondary | ICD-10-CM | POA: Diagnosis present

## 2021-10-26 DIAGNOSIS — T508X5A Adverse effect of diagnostic agents, initial encounter: Secondary | ICD-10-CM | POA: Diagnosis present

## 2021-10-26 DIAGNOSIS — F1010 Alcohol abuse, uncomplicated: Secondary | ICD-10-CM

## 2021-10-26 DIAGNOSIS — E785 Hyperlipidemia, unspecified: Secondary | ICD-10-CM | POA: Diagnosis present

## 2021-10-26 DIAGNOSIS — N170 Acute kidney failure with tubular necrosis: Secondary | ICD-10-CM | POA: Diagnosis present

## 2021-10-26 DIAGNOSIS — R7989 Other specified abnormal findings of blood chemistry: Secondary | ICD-10-CM | POA: Diagnosis present

## 2021-10-26 DIAGNOSIS — I10 Essential (primary) hypertension: Secondary | ICD-10-CM

## 2021-10-26 DIAGNOSIS — N1411 Contrast-induced nephropathy: Secondary | ICD-10-CM | POA: Diagnosis present

## 2021-10-26 DIAGNOSIS — R1013 Epigastric pain: Secondary | ICD-10-CM

## 2021-10-26 DIAGNOSIS — R7401 Elevation of levels of liver transaminase levels: Secondary | ICD-10-CM

## 2021-10-26 DIAGNOSIS — Z8616 Personal history of COVID-19: Secondary | ICD-10-CM | POA: Diagnosis not present

## 2021-10-26 DIAGNOSIS — I5032 Chronic diastolic (congestive) heart failure: Secondary | ICD-10-CM | POA: Diagnosis present

## 2021-10-26 DIAGNOSIS — D6959 Other secondary thrombocytopenia: Secondary | ICD-10-CM | POA: Diagnosis present

## 2021-10-26 DIAGNOSIS — E872 Acidosis, unspecified: Secondary | ICD-10-CM

## 2021-10-26 DIAGNOSIS — I110 Hypertensive heart disease with heart failure: Secondary | ICD-10-CM | POA: Diagnosis present

## 2021-10-26 DIAGNOSIS — R519 Headache, unspecified: Secondary | ICD-10-CM | POA: Diagnosis present

## 2021-10-26 DIAGNOSIS — J45909 Unspecified asthma, uncomplicated: Secondary | ICD-10-CM | POA: Diagnosis present

## 2021-10-26 DIAGNOSIS — E8809 Other disorders of plasma-protein metabolism, not elsewhere classified: Secondary | ICD-10-CM | POA: Diagnosis present

## 2021-10-26 LAB — LACTIC ACID, PLASMA
Lactic Acid, Venous: 3.3 mmol/L (ref 0.5–1.9)
Lactic Acid, Venous: 4 mmol/L (ref 0.5–1.9)
Lactic Acid, Venous: 4.1 mmol/L (ref 0.5–1.9)

## 2021-10-26 LAB — CBC WITH DIFFERENTIAL/PLATELET
Abs Immature Granulocytes: 0.06 10*3/uL (ref 0.00–0.07)
Basophils Absolute: 0 10*3/uL (ref 0.0–0.1)
Basophils Relative: 1 %
Eosinophils Absolute: 0 10*3/uL (ref 0.0–0.5)
Eosinophils Relative: 0 %
HCT: 36.2 % — ABNORMAL LOW (ref 39.0–52.0)
Hemoglobin: 12.1 g/dL — ABNORMAL LOW (ref 13.0–17.0)
Immature Granulocytes: 1 %
Lymphocytes Relative: 11 %
Lymphs Abs: 0.7 10*3/uL (ref 0.7–4.0)
MCH: 21.8 pg — ABNORMAL LOW (ref 26.0–34.0)
MCHC: 33.4 g/dL (ref 30.0–36.0)
MCV: 65.3 fL — ABNORMAL LOW (ref 80.0–100.0)
Monocytes Absolute: 0.4 10*3/uL (ref 0.1–1.0)
Monocytes Relative: 6 %
Neutro Abs: 5.4 10*3/uL (ref 1.7–7.7)
Neutrophils Relative %: 81 %
Platelets: 156 10*3/uL (ref 150–400)
RBC: 5.54 MIL/uL (ref 4.22–5.81)
RDW: 16.9 % — ABNORMAL HIGH (ref 11.5–15.5)
WBC: 6.6 10*3/uL (ref 4.0–10.5)
nRBC: 0 % (ref 0.0–0.2)

## 2021-10-26 LAB — COMPREHENSIVE METABOLIC PANEL
ALT: 1936 U/L — ABNORMAL HIGH (ref 0–44)
AST: 6742 U/L — ABNORMAL HIGH (ref 15–41)
Albumin: 3.2 g/dL — ABNORMAL LOW (ref 3.5–5.0)
Alkaline Phosphatase: 104 U/L (ref 38–126)
Anion gap: 14 (ref 5–15)
BUN: 17 mg/dL (ref 8–23)
CO2: 20 mmol/L — ABNORMAL LOW (ref 22–32)
Calcium: 7.8 mg/dL — ABNORMAL LOW (ref 8.9–10.3)
Chloride: 102 mmol/L (ref 98–111)
Creatinine, Ser: 1.86 mg/dL — ABNORMAL HIGH (ref 0.61–1.24)
GFR, Estimated: 40 mL/min — ABNORMAL LOW (ref >60)
Glucose, Bld: 91 mg/dL (ref 70–99)
Potassium: 4.1 mmol/L (ref 3.5–5.1)
Sodium: 136 mmol/L (ref 135–145)
Total Bilirubin: 2.4 mg/dL — ABNORMAL HIGH (ref 0.3–1.2)
Total Protein: 5.7 g/dL — ABNORMAL LOW (ref 6.5–8.1)

## 2021-10-26 LAB — IRON AND TIBC
Iron: 241 ug/dL — ABNORMAL HIGH (ref 45–182)
Saturation Ratios: 103 % — ABNORMAL HIGH (ref 17.9–39.5)
TIBC: 235 ug/dL — ABNORMAL LOW (ref 250–450)

## 2021-10-26 LAB — URINALYSIS, ROUTINE W REFLEX MICROSCOPIC
Bilirubin Urine: NEGATIVE
Glucose, UA: NEGATIVE mg/dL
Ketones, ur: NEGATIVE mg/dL
Leukocytes,Ua: NEGATIVE
Nitrite: NEGATIVE
Protein, ur: >300 mg/dL — AB
Specific Gravity, Urine: 1.025 (ref 1.005–1.030)
pH: 5.5 (ref 5.0–8.0)

## 2021-10-26 LAB — URINALYSIS, MICROSCOPIC (REFLEX): Bacteria, UA: NONE SEEN

## 2021-10-26 LAB — GAMMA GT: GGT: 525 U/L — ABNORMAL HIGH (ref 7–50)

## 2021-10-26 LAB — MAGNESIUM: Magnesium: 1.8 mg/dL (ref 1.7–2.4)

## 2021-10-26 LAB — HIV ANTIBODY (ROUTINE TESTING W REFLEX): HIV Screen 4th Generation wRfx: NONREACTIVE

## 2021-10-26 LAB — RESP PANEL BY RT-PCR (FLU A&B, COVID) ARPGX2
Influenza A by PCR: NEGATIVE
Influenza B by PCR: NEGATIVE
SARS Coronavirus 2 by RT PCR: NEGATIVE

## 2021-10-26 LAB — RAPID URINE DRUG SCREEN, HOSP PERFORMED
Amphetamines: NOT DETECTED
Barbiturates: NOT DETECTED
Benzodiazepines: NOT DETECTED
Cocaine: NOT DETECTED
Opiates: NOT DETECTED
Tetrahydrocannabinol: POSITIVE — AB

## 2021-10-26 LAB — PROTIME-INR
INR: 1.4 — ABNORMAL HIGH (ref 0.8–1.2)
Prothrombin Time: 16.9 seconds — ABNORMAL HIGH (ref 11.4–15.2)

## 2021-10-26 LAB — FERRITIN: Ferritin: 1268 ng/mL — ABNORMAL HIGH (ref 24–336)

## 2021-10-26 LAB — PHOSPHORUS: Phosphorus: 3 mg/dL (ref 2.5–4.6)

## 2021-10-26 LAB — ACETAMINOPHEN LEVEL: Acetaminophen (Tylenol), Serum: <10 ug/mL — ABNORMAL LOW (ref 10–30)

## 2021-10-26 LAB — BILIRUBIN, DIRECT: Bilirubin, Direct: 1 mg/dL — ABNORMAL HIGH (ref 0.0–0.2)

## 2021-10-26 LAB — CK: Total CK: 57 U/L (ref 49–397)

## 2021-10-26 MED ORDER — HYDRALAZINE HCL 20 MG/ML IJ SOLN
10.0000 mg | Freq: Four times a day (QID) | INTRAMUSCULAR | Status: DC | PRN
  Administered 2021-10-26 – 2021-10-28 (×4): 10 mg via INTRAVENOUS
  Filled 2021-10-26 (×4): qty 1

## 2021-10-26 MED ORDER — FENTANYL CITRATE PF 50 MCG/ML IJ SOSY
25.0000 ug | PREFILLED_SYRINGE | INTRAMUSCULAR | Status: DC | PRN
Start: 2021-10-26 — End: 2021-10-29
  Administered 2021-10-26: 25 ug via INTRAVENOUS
  Filled 2021-10-26: qty 1

## 2021-10-26 MED ORDER — LORAZEPAM 1 MG PO TABS
1.0000 mg | ORAL_TABLET | ORAL | Status: AC | PRN
  Administered 2021-10-26: 2 mg via ORAL
  Administered 2021-10-26 – 2021-10-28 (×3): 1 mg via ORAL
  Filled 2021-10-26 (×3): qty 1
  Filled 2021-10-26: qty 2

## 2021-10-26 MED ORDER — THIAMINE HCL 100 MG/ML IJ SOLN
100.0000 mg | Freq: Once | INTRAMUSCULAR | Status: AC
  Administered 2021-10-26: 100 mg via INTRAVENOUS
  Filled 2021-10-26: qty 2

## 2021-10-26 MED ORDER — LORAZEPAM 2 MG/ML IJ SOLN
1.0000 mg | INTRAMUSCULAR | Status: AC | PRN
  Administered 2021-10-27 – 2021-10-28 (×4): 2 mg via INTRAVENOUS
  Filled 2021-10-26 (×4): qty 1

## 2021-10-26 MED ORDER — SPIRONOLACTONE 25 MG PO TABS
25.0000 mg | ORAL_TABLET | Freq: Every day | ORAL | Status: DC
  Administered 2021-10-26: 25 mg via ORAL
  Filled 2021-10-26 (×2): qty 1

## 2021-10-26 MED ORDER — FOLIC ACID 1 MG PO TABS
1.0000 mg | ORAL_TABLET | Freq: Every day | ORAL | Status: DC
  Administered 2021-10-26 – 2021-10-31 (×6): 1 mg via ORAL
  Filled 2021-10-26 (×6): qty 1

## 2021-10-26 MED ORDER — THIAMINE HCL 100 MG/ML IJ SOLN
100.0000 mg | Freq: Every day | INTRAMUSCULAR | Status: DC
  Filled 2021-10-26: qty 2

## 2021-10-26 MED ORDER — THIAMINE HCL 100 MG PO TABS
100.0000 mg | ORAL_TABLET | Freq: Every day | ORAL | Status: DC
  Administered 2021-10-26 – 2021-10-31 (×6): 100 mg via ORAL
  Filled 2021-10-26 (×6): qty 1

## 2021-10-26 MED ORDER — ACETAMINOPHEN 325 MG PO TABS
650.0000 mg | ORAL_TABLET | Freq: Four times a day (QID) | ORAL | Status: DC | PRN
  Administered 2021-10-26: 650 mg via ORAL
  Filled 2021-10-26: qty 2

## 2021-10-26 MED ORDER — AMLODIPINE BESYLATE 5 MG PO TABS
5.0000 mg | ORAL_TABLET | Freq: Every day | ORAL | Status: DC
  Administered 2021-10-26 – 2021-10-27 (×2): 5 mg via ORAL
  Filled 2021-10-26 (×2): qty 1

## 2021-10-26 MED ORDER — ADULT MULTIVITAMIN W/MINERALS CH
1.0000 | ORAL_TABLET | Freq: Every day | ORAL | Status: DC
  Administered 2021-10-26 – 2021-10-31 (×6): 1 via ORAL
  Filled 2021-10-26 (×6): qty 1

## 2021-10-26 MED ORDER — LACTATED RINGERS IV SOLN: INTRAVENOUS | Status: AC

## 2021-10-26 MED ORDER — ACETAMINOPHEN 650 MG RE SUPP: 650.0000 mg | Freq: Four times a day (QID) | RECTAL | Status: DC | PRN

## 2021-10-26 MED ORDER — LACTATED RINGERS IV SOLN: INTRAVENOUS | Status: DC

## 2021-10-26 MED ORDER — METOPROLOL SUCCINATE ER 50 MG PO TB24
50.0000 mg | ORAL_TABLET | Freq: Every day | ORAL | Status: DC
  Administered 2021-10-26 – 2021-10-31 (×6): 50 mg via ORAL
  Filled 2021-10-26: qty 1
  Filled 2021-10-26: qty 2
  Filled 2021-10-26 (×3): qty 1
  Filled 2021-10-26: qty 2

## 2021-10-26 MED ORDER — ONDANSETRON HCL 4 MG/2ML IJ SOLN
4.0000 mg | Freq: Four times a day (QID) | INTRAMUSCULAR | Status: DC | PRN
  Administered 2021-10-26 – 2021-10-27 (×2): 4 mg via INTRAVENOUS
  Filled 2021-10-26 (×3): qty 2

## 2021-10-26 MED ORDER — NALOXONE HCL 0.4 MG/ML IJ SOLN: 0.4000 mg | INTRAMUSCULAR | Status: DC | PRN

## 2021-10-26 MED ORDER — LORAZEPAM 2 MG/ML IJ SOLN
0.5000 mg | Freq: Four times a day (QID) | INTRAMUSCULAR | Status: DC | PRN
  Administered 2021-10-30: 0.5 mg via INTRAVENOUS
  Filled 2021-10-26: qty 1

## 2021-10-26 MED ORDER — ONDANSETRON HCL 4 MG/2ML IJ SOLN: 4.0000 mg | Freq: Four times a day (QID) | INTRAMUSCULAR | Status: DC | PRN

## 2021-10-26 NOTE — ED Notes (Signed)
Noted that pt home meds not ordered. Pt asking for his BP meds. Notified Sheikh DO about pt request to have his meds ordered. Awaiting response. Also notified DO about pt being hypertensive 178/110.

## 2021-10-26 NOTE — ED Notes (Signed)
Pt given sandwich bag and soda per request. No acute changes noted. Call bell within reach, side rails up x2, urinal at bedside. Will continue to monitor. IVF infusing without difficulty.

## 2021-10-26 NOTE — Consult Note (Signed)
[facility] Consultation Note  Referring Provider: [facility] Primary Care Physician:  Nolene Ebbs, MD  Reason for Consultation:  elevated LFTs  HPI: Bradley Ray is a 66 y.o. male presents epigastric + RUQ pain and elevated LFTs.  History of alcohol use (no change in amount over past several months) and polysubstance abuse (cocaine, none for years per patient).  Some nausea.  No blood in stool.  AST/ALT in 1000s.  Elevated lactate.  Imaging shows no obvious gallstones or biliary ductal dilatation.  No other new medications or illicit drugs or herbal therapies.   Past Medical History:  Diagnosis Date   Alcoholism ([place])    per epic documentation long hx dependence--- (06-30-2019  per pt was drinking average 3 shots dialy until 03/ 2020, since then one beer weekly)   Arthritis    knees   Asthma    Beta thalassemia trait    per hematologist/ oncologist note- dr Dorita Sciara--  per blood work-up  dx 02/ 2017   Bladder neck contracture    BPH with obstruction/lower urinary tract symptoms    Chronic kidney disease    Hx: of Hemodyalysis   Cocaine abuse, in remission ([place])    06-30-2019   per pt states last used 12/ 2017   COVID 11/09/2019   loss of taste and smell and appetite x 14 days all symptoms resolved   ED (erectile dysfunction)    Hepatitis    hepatitis a age 24   History of acute renal failure 12/2018   kidneys fully recovered no nephrologist per pt   History of chest pain    01-06-2019 hospital admission-- dx acute renal failure, hypotensive with associated shock liver, SIRS with tachycardia/ tachyapnea,  chf diastolic  ;   [phone]  per pt no chest pain since and everything resolved , followed by pcp   Hyperlipidemia    Hypertension    Prostate cancer [facility]) urologist-  dr wrenn/  oncologist-  dr Tammi Klippel   dx 07-02-2016,  T1c,  Gleason 3+3,  PSA 6.48,  48.44cc   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 (previously dr Alvy Bimler -[facility])   hx  splenic infarct 02/ 2017  anticoagulated w/ xarelto until june 2017 changed to asa 162mg  daily/   (06-30-2019 per pt now taking ASA 81mg  )   Wears dentures    upper    Past Surgical History:  Procedure Laterality Date   CYSTOSCOPY N/A 10/15/2016   Procedure: CYSTOSCOPY FLEXIBLE;  Surgeon: [name], MD;  Location: [facility];  Service: Urology;  Laterality: N/A;  NO SEEDS FOUND IN BLADDER   CYSTOSCOPY N/A 08/01/2020   Procedure: CYSTOSCOPY/removal of bladder stone and bladder neck biopsy;  Surgeon: [name], MD;  Location: [facility];  Service: Urology;  Laterality: N/A;   IR FLUORO GUIDE CV LINE RIGHT  01/09/2019   IR US GUIDE VASC ACCESS RIGHT  01/09/2019   ORIF LEFT ANKLE  1980's   PROSTATE BIOPSY     RADIOACTIVE SEED IMPLANT N/A 10/15/2016   Procedure: RADIOACTIVE SEED IMPLANT/BRACHYTHERAPY IMPLANT;  Surgeon: [name], MD;  Location: [facility];  Service: Urology;  Laterality: N/A;     66    SEEDS IMPLANTED   REPAIR EXTENSOR TENDON Right 06/02/2016   Procedure: RIGHT LONG FINGER EXTENSOR TENDON REPAIR;  Surgeon: Milly Jakob, MD;  Location: [place];  Service: Orthopedics;  Laterality: Right;   TOTAL KNEE ARTHROPLASTY Right 2010  approx.   TOTAL KNEE ARTHROPLASTY Left 12/31/2020  Procedure: TOTAL KNEE ARTHROPLASTY;  Surgeon: [name], MD;  Location: [facility];  Service: Orthopedics;  Laterality: Left;   TRANSURETHRAL INCISION OF BLADDER NECK N/A 07/04/2019   Procedure: CYSTOSCOPY WITH INCISION OF BLADDER NECK CONTRACTURE, BALLOON DILITATION;  Surgeon: [name], MD;  Location: [facility];  Service: Urology;  Laterality: N/A;   TRANSURETHRAL INCISION OF BLADDER NECK N/A 08/01/2020   Procedure: INCISION OF BLADDER NECK;  Surgeon: [name], MD;  Location: [facility];  Service: Urology;  Laterality: N/A;  1 HR   TRANSURETHRAL RESECTION OF PROSTATE N/A 06/16/2018   Procedure: TRANSURETHRAL RESECTION OF THE PROSTATE (TURP);   Surgeon: [name], MD;  Location: [facility];  Service: Urology;  Laterality: N/A;    Prior to Admission medications   Medication Sig Start Date End Date Taking? Authorizing Provider  albuterol (PROVENTIL HFA;VENTOLIN HFA) 108 (90 Base) MCG/ACT inhaler Inhale 2 puffs into the lungs every 6 (six) hours as needed for wheezing or shortness of breath.   Yes [provider]  amLODipine (NORVASC) 5 MG tablet Take 5 mg by mouth daily.   Yes [provider]  aspirin EC 81 MG tablet Take 1 tablet (81 mg total) by mouth in the morning and at bedtime. To prevent blood clots after surgery Patient taking differently: Take 81 mg by mouth in the morning. 12/31/20  Yes Gawne, Meghan M, PA-C  atorvastatin (LIPITOR) 40 MG tablet Take 1 tablet (40 mg total) by mouth daily. 11/28/15  Yes Kelvin Cellar, MD  folic acid (FOLVITE) 1 MG tablet Take 1 tablet (1 mg total) by mouth daily. 01/19/19  Yes Antonieta Pert, MD  metoprolol succinate (TOPROL-XL) 50 MG 24 hr tablet Take 50 mg by mouth daily. 01/28/21  Yes [provider]  Thiamine HCl (VITAMIN B1) 100 MG TABS Take 100 mg by mouth in the morning.   Yes [provider]  spironolactone (ALDACTONE) 25 MG tablet Take 1 tablet (25 mg total) by mouth daily. 06/13/21   Nahser, Wonda Cheng, MD  thiamine (VITAMIN B-1) 100 MG tablet Take 100 mg by mouth daily.    [provider]  colchicine 0.6 MG tablet Take 0.5 tablets (0.3 mg total) by mouth 2 (two) times a week for 8 doses. Patient not taking: Reported on 09/27/2019 01/19/19 09/27/19  Antonieta Pert, MD    Current Facility-Administered Medications  Medication Dose Route Frequency Provider Last Rate Last Admin   amLODipine (NORVASC) tablet 5 mg  5 mg Oral Daily Raiford Noble [place], DO   5 mg at 10/26/21 1228   fentaNYL (SUBLIMAZE) injection 25 mcg  25 mcg Intravenous Q2H PRN Howerter, Justin B, DO       folic acid (FOLVITE) tablet 1 mg  1 mg Oral Daily Howerter, Justin B, DO   1 mg at 10/26/21  1118   hydrALAZINE (APRESOLINE) injection 10 mg  10 mg Intravenous Q6H PRN Sheikh, Omair Latif, DO       lactated ringers infusion   Intravenous Continuous Raiford Noble [place], DO 75 mL/hr at 10/26/21 1452 New Bag at 10/26/21 1452   LORazepam (ATIVAN) injection 0.5 mg  0.5 mg Intravenous Q6H PRN Howerter, Justin B, DO       LORazepam (ATIVAN) tablet 1-4 mg  1-4 mg Oral Q1H PRN Howerter, Justin B, DO   1 mg at 10/26/21 0330   Or   LORazepam (ATIVAN) injection 1-4 mg  1-4 mg Intravenous Q1H PRN Howerter, Justin B, DO  metoprolol succinate (TOPROL-XL) 24 hr tablet 50 mg  50 mg Oral Daily Raiford Noble [place], DO   50 mg at 10/26/21 1227   multivitamin with minerals tablet 1 tablet  1 tablet Oral Daily Howerter, Justin B, DO   1 tablet at 10/26/21 1119   naloxone [facility]) injection 0.4 mg  0.4 mg Intravenous PRN Howerter, Justin B, DO       ondansetron (ZOFRAN) injection 4 mg  4 mg Intravenous Q6H PRN Sheikh, Omair Latif, DO       thiamine tablet 100 mg  100 mg Oral Daily Howerter, Justin B, DO   100 mg at 10/26/21 1118   Or   thiamine (B-1) injection 100 mg  100 mg Intravenous Daily Howerter, Justin B, DO       Current Outpatient Medications  Medication Sig Dispense Refill   albuterol (PROVENTIL HFA;VENTOLIN HFA) 108 (90 Base) MCG/ACT inhaler Inhale 2 puffs into the lungs every 6 (six) hours as needed for wheezing or shortness of breath.     amLODipine (NORVASC) 5 MG tablet Take 5 mg by mouth daily.     aspirin EC 81 MG tablet Take 1 tablet (81 mg total) by mouth in the morning and at bedtime. To prevent blood clots after surgery (Patient taking differently: Take 81 mg by mouth in the morning.) 60 tablet 0   atorvastatin (LIPITOR) 40 MG tablet Take 1 tablet (40 mg total) by mouth daily. 30 tablet 1   folic acid (FOLVITE) 1 MG tablet Take 1 tablet (1 mg total) by mouth daily. 30 tablet 0   metoprolol succinate (TOPROL-XL) 50 MG 24 hr tablet Take 50 mg by mouth daily.     Thiamine HCl (VITAMIN  B1) 100 MG TABS Take 100 mg by mouth in the morning.     spironolactone (ALDACTONE) 25 MG tablet Take 1 tablet (25 mg total) by mouth daily. 90 tablet 3   thiamine (VITAMIN B-1) 100 MG tablet Take 100 mg by mouth daily.      Allergies as of 10/25/2021 - Review Complete 10/25/2021  Allergen Reaction Noted   Morphine and related Rash 11/26/2015    Family History  Problem Relation Age of Onset   Arrhythmia Mother        Has pacer   Throat cancer Father    Pancreatic cancer Sister    Clotting disorder Sister        related knee surgeries   Cancer Sister        pancreatic   Sickle cell trait Son    Cancer Maternal Aunt    Cancer Maternal Uncle    Cancer Other     Social History   Socioeconomic History   Marital status: Single    Spouse name: Not on file   Number of children: Not on file   Years of education: Not on file   Highest education level: Not on file  Occupational History   Not on file  Tobacco Use   Smoking status: Former    Packs/day: 1.00    Years: 30.00    Pack years: 30.00    Types: Cigarettes    Quit date: 06/01/2006    Years since quitting: 15.4   Smokeless tobacco: Never  Vaping Use   Vaping Use: Never used  Substance and Sexual Activity   Alcohol use: Yes    Comment: less than 1 beer per day   Drug use: Yes    Types: "Crack" cocaine, Marijuana    Comment: per pt  on 06-30-2019  states no cocaine since  2017/ last marijuana last used 07-25-2020   Sexual activity: Yes  Other Topics Concern   Not on file  Social History Narrative   Not on file   Social Determinants of Health   Financial Resource Strain: Not on file  Food Insecurity: Not on file  Transportation Needs: Not on file  Physical Activity: Not on file  Stress: Not on file  Social Connections: Not on file  Intimate Partner Violence: Not on file    Review of Systems: As per HPI, all others negative  Physical Exam: Vital signs in last 24 hours: Temp:  [97.6 F (36.4 C)] 97.6 F  (36.4 C) (01/14 1836) Pulse Rate:  [67-98] 70 (01/15 1300) Resp:  [16-28] 27 (01/15 1300) BP: (91-182)/(59-112) 174/112 (01/15 1300) SpO2:  [93 %-99 %] 97 % (01/15 1300)   General:   Alert,  Well-developed, well-nourished, pleasant and cooperative in NAD Head:  Normocephalic and atraumatic. Eyes:  Sclera clear, no icterus.   Conjunctiva pink. Ears:  Normal auditory acuity. Nose:  No deformity, discharge,  or lesions. Mouth:  No deformity or lesions.  Oropharynx pink & moist. Neck:  Supple; no masses or thyromegaly. Abdomen:  Soft, mild epigastric tenderness without peritonitis and nondistended. No masses, hepatosplenomegaly or hernias noted. Normal bowel sounds, without guarding, and without rebound.     Msk:  Symmetrical without gross deformities. Normal posture. Pulses:  Normal pulses noted. Extremities:  Without clubbing or edema. Neurologic:  Alert and  oriented x4;  grossly normal neurologically. Skin:  Intact without significant lesions or rashes. Psych:  Alert and cooperative. Normal mood and affect.   Lab Results: Recent Labs    10/25/21 1841 10/26/21 0215  WBC 4.1 6.6  HGB 12.0* 12.1*  HCT 36.6* 36.2*  PLT 196 156   BMET Recent Labs    10/25/21 1841 10/26/21 0215  NA 133* 136  K 3.1* 4.1  CL 99 102  CO2 14* 20*  GLUCOSE 73 91  BUN 15 17  CREATININE 0.91 1.86*  CALCIUM 7.9* 7.8*   LFT Recent Labs    10/26/21 0215  PROT 5.7*  ALBUMIN 3.2*  AST 6,742*  ALT 1,936*  ALKPHOS 104  BILITOT 2.4*  BILIDIR 1.0*   PT/INR Recent Labs    10/25/21 2136 10/26/21 0500  LABPROT 17.4* 16.9*  INR 1.4* 1.4*    Studies/Results: DG Chest 2 View  Result Date: 10/25/2021 CLINICAL DATA:  Chest pain EXAM: CHEST - 2 VIEW COMPARISON:  01/01/2021 FINDINGS: The heart size and mediastinal contours are within normal limits. Both lungs are clear. The visualized skeletal structures are unremarkable. IMPRESSION: No active cardiopulmonary disease. Electronically Signed    By: Inez Catalina M.D.   On: 10/25/2021 21:20   CT Angio Chest/Abd/Pel for Dissection W and/or Wo Contrast  Result Date: 10/25/2021 CLINICAL DATA:  Chest pain with mid epigastric pain. EXAM: CT ANGIOGRAPHY CHEST, ABDOMEN AND PELVIS TECHNIQUE: Non-contrast CT of the chest was initially obtained. Multidetector CT imaging through the chest, abdomen and pelvis was performed using the standard protocol during bolus administration of intravenous contrast. Multiplanar reconstructed images and MIPs were obtained and reviewed to evaluate the vascular anatomy. RADIATION DOSE REDUCTION: This exam was performed according to the departmental dose-optimization program which includes automated exposure control, adjustment of the mA and/or kV according to patient size and/or use of iterative reconstruction technique. CONTRAST:  60mL OMNIPAQUE IOHEXOL 350 MG/ML SOLN COMPARISON:  January 01, 2021 FINDINGS: CTA CHEST FINDINGS Cardiovascular:  There is very mild calcification of the aortic arch, without evidence of aortic aneurysm or dissection. Satisfactory opacification of the pulmonary arteries to the segmental level. No evidence of pulmonary embolism. Normal heart size with moderate to marked severity coronary artery calcification. No pericardial effusion. Mediastinum/Nodes: No enlarged mediastinal, hilar, or axillary lymph nodes. Thyroid gland, trachea, and esophagus demonstrate no significant findings. Lungs/Pleura: A small, stable, thin walled parenchymal cyst is seen within the posteromedial aspect of the right middle lobe. There is no evidence of acute infiltrate, pleural effusion or pneumothorax. Musculoskeletal: No chest wall abnormality. No acute or significant osseous findings. Review of the MIP images confirms the above findings. CTA ABDOMEN AND PELVIS FINDINGS VASCULAR Aorta: Normal caliber aorta without aneurysm, dissection, vasculitis or significant stenosis. Celiac: Patent without evidence of aneurysm, dissection,  vasculitis or significant stenosis. SMA: Patent without evidence of aneurysm, dissection, vasculitis or significant stenosis. Renals: Both renal arteries are patent without evidence of aneurysm, dissection, vasculitis, fibromuscular dysplasia or significant stenosis. IMA: Patent without evidence of aneurysm, dissection, vasculitis or significant stenosis. Inflow: Patent without evidence of aneurysm, dissection, vasculitis or significant stenosis. Veins: No obvious venous abnormality within the limitations of this arterial phase study. Review of the MIP images confirms the above findings. NON-VASCULAR Hepatobiliary: There is mild, diffuse fatty infiltration of the liver parenchyma. No focal liver abnormality is seen. No gallstones, gallbladder wall thickening, or biliary dilatation. Pancreas: Unremarkable. No pancreatic ductal dilatation or surrounding inflammatory changes. Spleen: Normal in size without focal abnormality. Adrenals/Urinary Tract: There is mild diffuse enlargement of the left adrenal gland. The right adrenal gland is unremarkable. Kidneys are normal in size, without obstructing renal calculi, focal lesion, or hydronephrosis. A 3 mm nonobstructing renal calculus is seen within the mid to lower left kidney. Mild, bilateral perinephric inflammatory fat stranding is noted. This represents a new finding. There is mild to moderate severity diffuse urinary bladder wall thickening with a mild amount of surrounding inflammatory fat stranding. Stomach/Bowel: Stomach is within normal limits. Appendix appears normal. No evidence of bowel wall thickening, distention, or inflammatory changes. Lymphatic: No abnormal abdominal or pelvic lymph nodes are identified. Reproductive: Multiple prostate radiation implantation seeds are seen. Other: No abdominal wall hernia or abnormality. No abdominopelvic ascites. Musculoskeletal: No acute or significant osseous findings. Review of the MIP images confirms the above  findings. IMPRESSION: 1. No evidence of aortic aneurysm or dissection. 2. Moderate to marked severity coronary artery calcification. 3. Mild, diffuse fatty infiltration of the liver parenchyma. 4. Findings suggestive of mild to moderate severity cystitis and acute pyelonephritis. Correlation with urinalysis is recommended. 5. 3 mm nonobstructing left renal calculus. 6. Multiple prostate radiation implantation seeds. Aortic Atherosclerosis (ICD10-I70.0). Electronically Signed   By: Virgina Norfolk M.D.   On: 10/25/2021 21:11   US Abdomen Limited RUQ (LIVER/GB)  Result Date: 10/26/2021 CLINICAL DATA:  Elevated LFTs EXAM: ULTRASOUND ABDOMEN LIMITED RIGHT UPPER QUADRANT COMPARISON:  None. FINDINGS: Gallbladder: No gallstones or wall thickening visualized. No sonographic Murphy sign noted by sonographer. Common bile duct: Diameter: 4 mm Liver: No focal lesion identified. Within normal limits in parenchymal echogenicity. Portal vein is patent on color Doppler imaging with normal direction of blood flow towards the liver. Other: Small amount of ascites IMPRESSION: Small amount of ascites. No cholelithiasis or other evidence of acute cholecystitis. Electronically Signed   By: Ulyses Jarred M.D.   On: 10/26/2021 02:14    Impression:   EG/RUQ pain.  Elevated LFTs.  Alcohol use/abuse.  No change per patient in  amount over the past several months. History polysubstance abuse, including cocaine, but none for years per patient  Plan:   Elevated LFTs in 1000s would be fairly unusual for biliary tract process.  I am most concerned about some sort of hepatocellular process (infectious hepatitis especially) but gallstone/biliary tract process not completely excluded. Will check hepatitis panel and other serologies.  MRI/MRCP if allowed (has prostate radioactive seed implants). Alcohol cessation. Follow LFTs and PT/INR daily.   LOS: 0 days   Eion Timbrook M  10/26/2021, 3:11 PM  Cell [phone] If no  answer or after 5 PM call [phone]

## 2021-10-26 NOTE — ED Notes (Signed)
Attempted IV start x 2. Unsuccessful. Asked another RN to come try to obtain IV access.

## 2021-10-26 NOTE — ED Notes (Signed)
Pt NAD, a/ox4. Pt states he has intermittent epigastric pain that does not present currently, but does c/o nausea/vomiting. Denies SOB, dizziness, CP.

## 2021-10-26 NOTE — Progress Notes (Signed)
Care started prior to midnight in the emergency room and patient was admitted early this morning after midnight by Dr. Babs Bertin and I am in current agreement with his assessment and plan.  Additional changes to plan of care been made accordingly.  Patient is a 66 year old African-American male with a past medical history significant for but not limited to chronic alcohol abuse, essential hypertension, hyperlipidemia as well as other comorbidities who presented with abdominal pain.  Who presented with 1 day of complaints of sharp abdominal pain located in epigastric area is right upper quadrant pain without radiation to the back or the flank.  He denies any nausea, vomiting, diarrhea, melena or hematochezia but did hav further worsened with palpation of the right upper quadrant/midepigastric area.  He does report that he typically consumes 4-5 shots of liquor on a daily basis and has been consuming alcohol FOR several months with his most recent alcohol consumption being on 11 AM 10/25/2021.  He also acknowledged a prior history of cocaine use and states his been multiple years since his most recent use.  He presented to the ED with his abdominal pain and was found to have significantly abnormal LFTs which are worsening.  He was given IV fluid hydration and he also was noted to have a lactic acid level was elevated at 4.0.  Imaging was done in the ED and a CT of the chest and abdomen pelvis was done and showed no evidence of aortic aneurysm, dissection or acute PE but it did show no evidence of gallbladder wall thickening, pericholecystic fluid, choledocholithiasis or common bile duct dilatation.  Right upper quadrant ultrasound was done and did show small amount of ascites but no evidence of cholelithiasis or evidence of acute cholecystitis.  His LFTs have significantly worsened since admission and trended up and his AST is now 6742 and his ALT is now 1936.  T bili also worsened to 2.4.  Lactic acid level was  trending down slowly.  Given his findings GI was consulted for further evaluation recommendations.  Currently He is being admitted and treated for the following but not limited to:  Acute Hepatitis/Abnormal LFTs -He had an episode of epigastric discomfort and right upper quadrant abdominal pain and his AST and ALT have significantly worsened given his prior values -He had  previous episode of acute transaminitis back in April 2020 -CT of the abdomen pelvis showed mild diffuse infiltration of the liver parenchyma but no evidence of acute cholecystitis or choledocholithiasis and no dilatation of the common bile duct -LFTs have significantly worsened and now AST is over 6000 and ALT is almost 2000 -T bili is trending up 2 and 1-2.4 -His viral hepatitis panel is negative -UDS is negative except cannabis -He is nonseptic appearing -Could have shock liver although his blood pressures were only mildly low -Upper quadrant ultrasound done and showed a small amount of ascites but no evidence of cholelithiasis or evidence of acute cholecystitis -Continue monitor and trend LFTs -Lipase was not elevated -Continue to hold his home atorvastatin for now -GGT was elevated at 525 -Repeat PT/INR in the morning -Alcohol cessation counseling given -GI consulted for further evaluation recommendations  Elevated anion gap metabolic acidosis, improving -Improving and trending downwards -In the setting of lactic acidosis likely in the setting of alcoholic lactic acidosis -Has no evidence of underlying infectious process at this time -Potential additional elevation of lactate could be in the setting of acute hepatic insufficiency -Continue IV fluid hydration with lactated Ringer's but reduce  rate to 75 MLS per hour for 1 more day -Strict I's and O's and daily weights and repeat CBC, CMP in the morning  AKI -Unclear etiology but likely related to Contrast -His BUN/creatinine went from 15/0.91 and is trended up  to 17/1.86 -CTA Abdomen and Pelvis done and showed "No evidence of aortic aneurysm or dissection. Moderate to marked severity coronary artery calcification. Mild, diffuse fatty infiltration of the liver parenchyma. Findings suggestive of mild to moderate severity cystitis and acute pyelonephritis. Correlation with urinalysis is recommended. 3 mm nonobstructing left renal calculus. Multiple prostate radiation implantation seeds." -Has no Urinary Symptoms;  -Urinalysis done and showed turbid appearance with moderate hemoglobin, no bacteria seen, 0-5 RBCs per high-power field, 0-5 WBCs -Strict I's and O's and daily weights -Avoid further nephrotoxic medications, contrast dyes, hypotension renally adjust medications -Check urine sodium, urine creatinine urine Osm and continue with IV fluid hydration as above -Repeat CMP in a.m.  Microcytic Anemia -Patient's hemoglobin/hematocrit went from 12.0/36.6 is now 12.1/36.2 and he has an MCV of 65.3 -Check anemia panel in a.m. -Continue to monitor for signs and symptoms of bleeding; currently no overt bleeding noted -Repeat CBC in a.m.  Hypokalemia -Mild and improved.  Patient's potassium went from 3.1 and is now 4.1 -Continue monitor and trend and repeat CMP in a.m.  Essential Hypertension -Blood pressure was elevated at 174/112 -Resume home antihypertensive medications including amlodipine 5 mg daily, spironolactone 25 mg p.o. daily as well as metoprolol succinate 50 mg p.o. daily -Add IV hydralazine 10 mg every 6 as needed for systolic blood pressure greater than 449 or diastolic blood pressure greater 100  HLD -Holding High Intensity Atorvastatin given Abnormal LFTs  Alcohol abuse with concern for withdrawal -Reports 4-5 shots of liquor every day specifically brandy his most recent alcohol consumption occurred 11 AM on 10/25/2021 prior to coming to ED -Ethanol level on admission was 38 -Initiated on CIWA score with as needed Ativan -Seizure  precaution -Continue with oral thiamine, folic acid and multivitamin with minerals  Will continue to monitor the patient's clinical response to intervention and repeat blood work and imaging in the a.m.

## 2021-10-26 NOTE — ED Notes (Signed)
Notified DO about pt BP 178/110 when he came to pt bedside

## 2021-10-26 NOTE — ED Notes (Signed)
Pt c/o headache, requesting something for pain.

## 2021-10-26 NOTE — ED Notes (Signed)
Provider at bedside speaking w/pt.

## 2021-10-26 NOTE — ED Notes (Signed)
Pt moved onto hospital bed

## 2021-10-26 NOTE — ED Notes (Signed)
Pt requesting to go to restroom and then walk around unit

## 2021-10-26 NOTE — ED Notes (Signed)
Pt to US.

## 2021-10-26 NOTE — ED Notes (Signed)
Pt has become increasingly restless, headache continues

## 2021-10-26 NOTE — H&P (Signed)
History and Physical    PLEASE NOTE THAT DRAGON DICTATION SOFTWARE WAS USED IN THE CONSTRUCTION OF THIS NOTE.   ERIS BRECK PXT:[id number] DOB: 1955-10-17 DOA: 10/25/2021  PCP: Nolene Ebbs, MD  Patient coming from: home   I have personally briefly reviewed patient's old medical records in [place]  Chief Complaint: Abdominal pain  HPI: Bradley Ray is a 66 y.o. male with medical history significant for chronic alcohol abuse, essential pretension, hyperlipidemia, who is admitted to [facility] on 10/25/2021 with acute transaminitis after presenting from home to [facility] ED complaining of abdominal pain.   The patient presents complaining of 1 day of sharp abdominal pain located in the epigastrium as well as the right upper quadrant, without radiation to the back or to the flank.  Denies any associated rash, nausea, vomiting, diarrhea, melena, hematochezia.  He also denies any subjective fever, there are chills, rigors, or generalized myalgias.  Denies any overt chest pain, shortness of breath, diaphoresis, palpitations, dizziness, presyncope, or syncope.  Reports that the above abdominal discomfort worsens with palpation over the epigastrium/right upper quadrant of the abdomen.  No recent trauma or travel.  No consumption of wild mushrooms or recent use of acetaminophen.  In the setting of his presenting abdominal discomfort, the patient notes a decline in oral intake of water over the course of the last day.  The patient reports that he typically consumes 4-5 shots of liquor on a daily basis, and has been consuming alcohol at this really consumption for at least the last several months if not longer.  Reports that his most recent alcohol consumption occurred at approximately 11 AM on 10/25/2021.  He acknowledges a prior history of cocaine use, but states it has been multiple years since his most recent such use.  Otherwise, denies recreational drug use.  Denies any recent  unintentional weight loss.  No known history of Wilson's disease, hemochromatosis, or inflammatory bowel disease.  He reports a history of viral hepatitis when he was 66 years old, which he believes was hepatitis A, but no subsequent known viral hepatitis.  Per chart review, there is some documentation of a history of shock liver 2 years ago.  Per chart review, his AST peaked in March 2020 8290, while his ALT peaked at that time at 2784.      ED Course:  Vital signs in the ED were notable for the following: Afebrile; heart rate 86-1 01, with most recent heart rate noted to be 89; initial blood pressure 91/59, with ensuing increased to 144/91 following interval administration of IV fluids, as further detailed below; respiratory rate 16-25, oxygen saturation 95 to 99% on room air.  Labs were notable for the following: CMP notable for the following: Potassium 3.1, bicarbonate 14, anion gap 20, BUN 15, creatinine 0.91 compared to most recent prior value of 0.78 in September 2022, calcium corrected for mild hypoalbuminemia 8.3, albumin 3.5, alkaline phosphatase 97, AST 3839 compared to 21 in April 2020, ALT 1356 compared to 88 in April 2020, total bilirubin 2.0.  Lipase 30.  Serum ethanol level 38.  Lactic acid 4.0.  CBC notable for white cell count 4100.  INR 1.4.  Acetaminophen less than 10 urinalysis showed no white blood cells, no bacteria, leukocyte Estrace negative, nitrate negative, moderate hemoglobin in the absence of any RBCs, and specific gravity 1.025.  Urinary drug screen has been ordered, with result currently pending.  COVID-19/influenza PCR were checked in the ED today and found to  be negative.  Imaging and additional notable ED work-up: EKG showed sinus rhythm with heart rate 85, QTc 5 6, nonspecific T wave inversion in aVL, no evidence of ST changes, including no evidence of ST elevation.  Chest x-ray showed no evidence of acute cardiopulmonary process.  CTA chest, abdomen, pelvis showed no  evidence of aortic aneurysm, dissection, acute pulmonary embolism, also showing no evidence of pulmonary infiltrate, pulmonary edema, pleural effusion, or pneumothorax; this imaging also showed mild diffuse infiltration of the liver parenchyma, but no evidence of gallbladder wall thickening, pericholecystic fluid, choledocholithiasis, or common bile duct dilation.  Right upper quadrant ultrasound has been ordered, with result currently pending at this time.  While in the ED, the following were administered: GI cocktail with viscous lidocaine x1 dose, Toradol 15 mg IV x1, lactated Ringer's x1 L bolus, normal saline x1 L bolus, potassium chloride 40 mEq p.o. x1 dose.  Subsequently, the patient is being admitted for overnight observation to med telemetry for further evaluation and management of presenting acute transaminitis in the setting of presenting epigastric/right upper quadrant pain, along with further evaluation management of corresponding anion gap metabolic acidosis in the setting of lactic acidosis.     Review of Systems: As per HPI otherwise 10 point review of systems negative.   Past Medical History:  Diagnosis Date   Alcoholism ([place])    per epic documentation long hx dependence--- (06-30-2019  per pt was drinking average 3 shots dialy until 03/ 2020, since then one beer weekly)   Arthritis    knees   Asthma    Beta thalassemia trait    per hematologist/ oncologist note- dr Dorita Sciara--  per blood work-up  dx 02/ 2017   Bladder neck contracture    BPH with obstruction/lower urinary tract symptoms    Chronic kidney disease    Hx: of Hemodyalysis   Cocaine abuse, in remission ([place])    06-30-2019   per pt states last used 12/ 2017   COVID 11/09/2019   loss of taste and smell and appetite x 14 days all symptoms resolved   ED (erectile dysfunction)    Hepatitis    hepatitis a age 19   History of acute renal failure 12/2018   kidneys fully recovered no nephrologist per pt   History  of chest pain    01-06-2019 hospital admission-- dx acute renal failure, hypotensive with associated shock liver, SIRS with tachycardia/ tachyapnea,  chf diastolic  ;   [id number]  per pt no chest pain since and everything resolved , followed by pcp   Hyperlipidemia    Hypertension    Prostate cancer [facility]) urologist-  dr wrenn/  oncologist-  dr Tammi Klippel   dx 07-02-2016,  T1c,  Gleason 3+3,  PSA 6.48,  48.44cc   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 (previously dr Alvy Bimler -[facility])   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  anticoagulated w/ xarelto until june 2017 changed to asa 162mg  daily/   (06-30-2019 per pt now taking ASA 81mg  )   Wears dentures    upper    Past Surgical History:  Procedure Laterality Date   CYSTOSCOPY N/A 10/15/2016   Procedure: CYSTOSCOPY FLEXIBLE;  Surgeon: [name], MD;  Location: [facility];  Service: Urology;  Laterality: N/A;  NO SEEDS FOUND IN BLADDER   CYSTOSCOPY N/A 08/01/2020   Procedure: CYSTOSCOPY/removal of bladder stone and bladder neck biopsy;  Surgeon: [name], MD;  Location: [facility];  Service: Urology;  Laterality: N/A;  IR FLUORO GUIDE CV LINE RIGHT  01/09/2019   IR US GUIDE VASC ACCESS RIGHT  01/09/2019   ORIF LEFT ANKLE  1980's   PROSTATE BIOPSY     RADIOACTIVE SEED IMPLANT N/A 10/15/2016   Procedure: RADIOACTIVE SEED IMPLANT/BRACHYTHERAPY IMPLANT;  Surgeon: [name], MD;  Location: [facility];  Service: Urology;  Laterality: N/A;     66    SEEDS IMPLANTED   REPAIR EXTENSOR TENDON Right 06/02/2016   Procedure: RIGHT LONG FINGER EXTENSOR TENDON REPAIR;  Surgeon: Milly Jakob, MD;  Location: [facility];  Service: Orthopedics;  Laterality: Right;   TOTAL KNEE ARTHROPLASTY Right 2010  approx.   TOTAL KNEE ARTHROPLASTY Left 12/31/2020   Procedure: TOTAL KNEE ARTHROPLASTY;  Surgeon: [name], MD;  Location: [facility];  Service: Orthopedics;  Laterality: Left;   TRANSURETHRAL INCISION OF BLADDER NECK N/A 07/04/2019    Procedure: CYSTOSCOPY WITH INCISION OF BLADDER NECK CONTRACTURE, BALLOON DILITATION;  Surgeon: [name], MD;  Location: [facility];  Service: Urology;  Laterality: N/A;   TRANSURETHRAL INCISION OF BLADDER NECK N/A 08/01/2020   Procedure: INCISION OF BLADDER NECK;  Surgeon: [name], MD;  Location: [facility];  Service: Urology;  Laterality: N/A;  1 HR   TRANSURETHRAL RESECTION OF PROSTATE N/A 06/16/2018   Procedure: TRANSURETHRAL RESECTION OF THE PROSTATE (TURP);  Surgeon: [name], MD;  Location: [facility];  Service: Urology;  Laterality: N/A;    Social History:  reports that he quit smoking about 15 years ago. His smoking use included cigarettes. He has a 30.00 pack-year smoking history. He has never used smokeless tobacco. He reports current alcohol use. He reports current drug use. Drugs: "Crack" cocaine and Marijuana.   Allergies  Allergen Reactions   Morphine And Related Rash    Family History  Problem Relation Age of Onset   Arrhythmia Mother        Has pacer   Throat cancer Father    Pancreatic cancer Sister    Clotting disorder Sister        related knee surgeries   Cancer Sister        pancreatic   Sickle cell trait Son    Cancer Maternal Aunt    Cancer Maternal Uncle    Cancer Other     Family history reviewed and not pertinent    Prior to Admission medications   Medication Sig Start Date End Date Taking? Authorizing Provider  albuterol (PROVENTIL HFA;VENTOLIN HFA) 108 (90 Base) MCG/ACT inhaler Inhale 2 puffs into the lungs every 6 (six) hours as needed for wheezing or shortness of breath.   Yes [provider]  amLODipine (NORVASC) 5 MG tablet Take 5 mg by mouth daily.   Yes [provider]  aspirin EC 81 MG tablet Take 1 tablet (81 mg total) by mouth in the morning and at bedtime. To prevent blood clots after surgery Patient taking differently: Take 81 mg by mouth in the morning. 12/31/20  Yes Gawne, Meghan  M, PA-C  atorvastatin (LIPITOR) 40 MG tablet Take 1 tablet (40 mg total) by mouth daily. 11/28/15  Yes Kelvin Cellar, MD  folic acid (FOLVITE) 1 MG tablet Take 1 tablet (1 mg total) by mouth daily. 01/19/19  Yes Antonieta Pert, MD  metoprolol succinate (TOPROL-XL) 50 MG 24 hr tablet Take 50 mg by mouth daily. 01/28/21  Yes [provider]  Thiamine HCl (VITAMIN B1) 100 MG TABS Take 100 mg by mouth in the morning.  Yes [provider]  spironolactone (ALDACTONE) 25 MG tablet Take 1 tablet (25 mg total) by mouth daily. 06/13/21   Nahser, Wonda Cheng, MD  thiamine (VITAMIN B-1) 100 MG tablet Take 100 mg by mouth daily.    [provider]  colchicine 0.6 MG tablet Take 0.5 tablets (0.3 mg total) by mouth 2 (two) times a week for 8 doses. Patient not taking: Reported on 09/27/2019 01/19/19 09/27/19  Antonieta Pert, MD     Objective    Physical Exam: Vitals:   10/25/21 2030 10/25/21 2215 10/25/21 2230 10/26/21 0014  BP: (!) 144/83 (!) 144/91 (!) 146/97 (!) 157/84  Pulse: 89 98 97 89  Resp: (!) 28 (!) 25 (!) 25 16  Temp:      SpO2: 99% 98% 95% 98%    General: appears to be stated age; alert, oriented Skin: warm, dry, no rash Head:  AT/[place] Mouth:  Oral mucosa membranes appear dry, normal dentition Neck: supple; trachea midline Heart:  RRR; did not appreciate any M/R/G Lungs: CTAB, did not appreciate any wheezes, rales, or rhonchi Abdomen: + BS; soft, ND, mild tenderness over the epigastrium as well as right upper quadrant, in the absence of any associated guarding, rigidity, or rebound tenderness. Vascular: 2+ pedal pulses b/l; 2+ radial pulses b/l Extremities: no peripheral edema, no muscle wasting Neuro: strength and sensation intact in upper and lower extremities b/l     Labs on Admission: I have personally reviewed following labs and imaging studies  CBC: Recent Labs  Lab 10/25/21 1841  WBC 4.1  HGB 12.0*  HCT 36.6*  MCV 65.5*  PLT 619   Basic Metabolic  Panel: Recent Labs  Lab 10/25/21 1841  NA 133*  K 3.1*  CL 99  CO2 14*  GLUCOSE 73  BUN 15  CREATININE 0.91  CALCIUM 7.9*   GFR: CrCl cannot be calculated (Unknown ideal weight.). Liver Function Tests: Recent Labs  Lab 10/25/21 1841  AST 3,839*  ALT 1,356*  ALKPHOS 97  BILITOT 2.0*  PROT 6.1*  ALBUMIN 3.5   Recent Labs  Lab 10/25/21 2136  LIPASE 30   No results for input(s): AMMONIA in the last 168 hours. Coagulation Profile: Recent Labs  Lab 10/25/21 2136  INR 1.4*   Cardiac Enzymes: No results for input(s): CKTOTAL, CKMB, CKMBINDEX, TROPONINI in the last 168 hours. BNP (last 3 results) No results for input(s): PROBNP in the last 8760 hours. HbA1C: No results for input(s): HGBA1C in the last 72 hours. CBG: No results for input(s): GLUCAP in the last 168 hours. Lipid Profile: No results for input(s): CHOL, HDL, LDLCALC, TRIG, CHOLHDL, LDLDIRECT in the last 72 hours. Thyroid Function Tests: Recent Labs    10/25/21 2136  TSH 0.521  FREET4 0.85   Anemia Panel: No results for input(s): VITAMINB12, FOLATE, FERRITIN, TIBC, IRON, RETICCTPCT in the last 72 hours. Urine analysis:    Component Value Date/Time   COLORURINE YELLOW 01/17/2019 1310   APPEARANCEUR CLEAR 01/17/2019 1310   LABSPEC 1.008 01/17/2019 1310   PHURINE 6.0 01/17/2019 1310   GLUCOSEU NEGATIVE 01/17/2019 1310   HGBUR MODERATE (A) 01/17/2019 1310   BILIRUBINUR NEGATIVE 01/17/2019 1310   KETONESUR NEGATIVE 01/17/2019 1310   PROTEINUR NEGATIVE 01/17/2019 1310   NITRITE NEGATIVE 01/17/2019 1310   LEUKOCYTESUR SMALL (A) 01/17/2019 1310    Radiological Exams on Admission: DG Chest 2 View  Result Date: 10/25/2021 CLINICAL DATA:  Chest pain EXAM: CHEST - 2 VIEW COMPARISON:  01/01/2021 FINDINGS: The heart size and  mediastinal contours are within normal limits. Both lungs are clear. The visualized skeletal structures are unremarkable. IMPRESSION: No active cardiopulmonary disease.  Electronically Signed   By: Inez Catalina M.D.   On: 10/25/2021 21:20   CT Angio Chest/Abd/Pel for Dissection W and/or Wo Contrast  Result Date: 10/25/2021 CLINICAL DATA:  Chest pain with mid epigastric pain. EXAM: CT ANGIOGRAPHY CHEST, ABDOMEN AND PELVIS TECHNIQUE: Non-contrast CT of the chest was initially obtained. Multidetector CT imaging through the chest, abdomen and pelvis was performed using the standard protocol during bolus administration of intravenous contrast. Multiplanar reconstructed images and MIPs were obtained and reviewed to evaluate the vascular anatomy. RADIATION DOSE REDUCTION: This exam was performed according to the departmental dose-optimization program which includes automated exposure control, adjustment of the mA and/or kV according to patient size and/or use of iterative reconstruction technique. CONTRAST:  36mL OMNIPAQUE IOHEXOL 350 MG/ML SOLN COMPARISON:  January 01, 2021 FINDINGS: CTA CHEST FINDINGS Cardiovascular: There is very mild calcification of the aortic arch, without evidence of aortic aneurysm or dissection. Satisfactory opacification of the pulmonary arteries to the segmental level. No evidence of pulmonary embolism. Normal heart size with moderate to marked severity coronary artery calcification. No pericardial effusion. Mediastinum/Nodes: No enlarged mediastinal, hilar, or axillary lymph nodes. Thyroid gland, trachea, and esophagus demonstrate no significant findings. Lungs/Pleura: A small, stable, thin walled parenchymal cyst is seen within the posteromedial aspect of the right middle lobe. There is no evidence of acute infiltrate, pleural effusion or pneumothorax. Musculoskeletal: No chest wall abnormality. No acute or significant osseous findings. Review of the MIP images confirms the above findings. CTA ABDOMEN AND PELVIS FINDINGS VASCULAR Aorta: Normal caliber aorta without aneurysm, dissection, vasculitis or significant stenosis. Celiac: Patent without evidence of  aneurysm, dissection, vasculitis or significant stenosis. SMA: Patent without evidence of aneurysm, dissection, vasculitis or significant stenosis. Renals: Both renal arteries are patent without evidence of aneurysm, dissection, vasculitis, fibromuscular dysplasia or significant stenosis. IMA: Patent without evidence of aneurysm, dissection, vasculitis or significant stenosis. Inflow: Patent without evidence of aneurysm, dissection, vasculitis or significant stenosis. Veins: No obvious venous abnormality within the limitations of this arterial phase study. Review of the MIP images confirms the above findings. NON-VASCULAR Hepatobiliary: There is mild, diffuse fatty infiltration of the liver parenchyma. No focal liver abnormality is seen. No gallstones, gallbladder wall thickening, or biliary dilatation. Pancreas: Unremarkable. No pancreatic ductal dilatation or surrounding inflammatory changes. Spleen: Normal in size without focal abnormality. Adrenals/Urinary Tract: There is mild diffuse enlargement of the left adrenal gland. The right adrenal gland is unremarkable. Kidneys are normal in size, without obstructing renal calculi, focal lesion, or hydronephrosis. A 3 mm nonobstructing renal calculus is seen within the mid to lower left kidney. Mild, bilateral perinephric inflammatory fat stranding is noted. This represents a new finding. There is mild to moderate severity diffuse urinary bladder wall thickening with a mild amount of surrounding inflammatory fat stranding. Stomach/Bowel: Stomach is within normal limits. Appendix appears normal. No evidence of bowel wall thickening, distention, or inflammatory changes. Lymphatic: No abnormal abdominal or pelvic lymph nodes are identified. Reproductive: Multiple prostate radiation implantation seeds are seen. Other: No abdominal wall hernia or abnormality. No abdominopelvic ascites. Musculoskeletal: No acute or significant osseous findings. Review of the MIP images  confirms the above findings. IMPRESSION: 1. No evidence of aortic aneurysm or dissection. 2. Moderate to marked severity coronary artery calcification. 3. Mild, diffuse fatty infiltration of the liver parenchyma. 4. Findings suggestive of mild to moderate severity cystitis and acute  pyelonephritis. Correlation with urinalysis is recommended. 5. 3 mm nonobstructing left renal calculus. 6. Multiple prostate radiation implantation seeds. Aortic Atherosclerosis (ICD10-I70.0). Electronically Signed   By: Virgina Norfolk M.D.   On: 10/25/2021 21:11     EKG: Independently reviewed, with result as described above.    Assessment/Plan   Principal Problem:   Transaminitis Active Problems:   Essential hypertension   High anion gap metabolic acidosis   Epigastric pain   Lactic acidosis   Hypokalemia   Hypocalcemia   Chronic alcohol abuse   HLD (hyperlipidemia)     #) Acute transaminitis: In setting of 1 day of epigastric/right upper quadrant abdominal discomfort, presenting labs notable for acute transaminitis with presenting AST 3800 and ALT 1300, which appears to be a new elevation given corresponding most recent prior values of 21 and 80 in April 2020.  This is in the context of a previous episode of acute transaminitis in March 2020, with AST/ALT peaking at that time at nearly 8320 800, respectively, as further quantified above.  Today's CT abdomen/pelvis shows mild diffuse infiltration of the liver parenchyma, but no evidence of acute cholecystitis or choledocholithiasis including no dilation of the common bile duct.  Additionally, patient's additional liver enzymes do not reflect an overt cholestatic pattern, including no elevation of alkaline phosphatase.  Nonseptic appearing.  Of note, acute viral hepatitis panel negative, and urinary drug screen pan negative with the exception of cannabis, which is notable given the patient's history of cocaine abuse, which can be associated with similar acute  transaminitis presentations.   In the setting of the patient's chronic alcohol abuse, differential includes acute alcoholic hepatitis, although Madrey's discriminant function calculated to be 11.2, thereby not meeting indication for initiation of corticosteroids at this time.  Shock liver is a possibility, although the patient's blood pressure at initial presentation was only mildly low with systolic blood pressures remaining in the 90s at that time, with subsequent improvement following IV fluids suggestive of a an element of very mild dehydration.  Of note, right upper quadrant ultrasound has been ordered to further evaluate the above, including further assessment for acute cholecystitis/choledocholithiasis, with plan to repeat CMP in the morning to further trend liver enzymes and monitoring for interval development of a cholestatic pattern.  Lipase nonelevated, and CT abdomen/pelvis shows no evidence of acute pancreatitis.  We will also further evaluate for evidence of underlying infiltrative disease given mild parenchymal infiltration seen on presenting imaging, including checking ferritin as well as ceruloplasmin level.  Hold home Lipitor for now.   Plan: Follow-up result of right upper quadrant ultrasound.  Repeat CMP/CBC in the morning.  Check direct bilirubin as well as GGT.  Repeat PT/INR in the morning.  Hold home Lipitor.  Check CPK level, ferritin, ceruloplasmin.  Counseled the patient on importance of reduction in alcohol consumption.  Prn IV fentanyl.     #) Anion gap metabolic acidosis: Presenting labs reflect this, in the setting of lactic acidosis, with differential including alcoholic lactic acidosis, particular given the patient's reported history of chronic alcohol abuse as well as elevated serum ethanol level of 38.  Does not meet SIRS criteria for sepsis, no evidence of underlying infectious process at this time.  Potential additional elevation of lactate in the setting of acute  hepatic insufficiency, with concern for acute alcoholic hepatitis, as above.  In the setting of urinalysis showing moderate hemoglobin in the absence of any RBCs, will also check CPK level.  Plan: Continuous lactated Ringer's, with repeat lactate  ordered for the morning.  Monitor strict I's and O's Daily weights repeat CMP/CBC in the morning.  Check CPK, salicylate levels.      #) Hypokalemia: Presenting for potassium level mildly low 3.1.  Status post 40 mill colons p.o. potassium chloride ministered in the ED this evening.  Plan: Add on serum magnesium level.  Repeat CMP in the morning.     #) Hypocalcemia: Corrected calcium mildly low at 8.3.  Will repeat level via CMP in the morning, with consideration for supplementation depending upon the interval trend.   Plan: Repeat CMP in the morning.  Add on serum magnesium level.        #) Essential Hypertension: documented h/o such, with outpatient antihypertensive regimen including Norvasc, Toprol-XL, and spironolactone.  Delete that in the setting of borderline initial hypotension, will hold home antihypertensive medications for now.   Plan: Close monitoring of subsequent BP via routine VS. hold home and hypertensive medications for now.        #) Hyperlipidemia: documented h/o such. On high intensity atorvastatin as outpatient.  In the setting of presenting acute transaminitis, will hold home atorvastatin for now.    Plan: Hold home statin for now, as above.  Add on CPK level.       #) Chronic Alcohol Abuse: the patient reports typical daily alcohol consumption of 4-5 shots of liquor per day, and that typical daily alcohol consumption of this volume has been occurring for at least the last several months if not longer, and reports desire to discontinue his alcohol consumption at this time, particularly given the intensity of associated abdominal pain with which he presented to the ED today.  Most recent alcohol consumption  occurred around 11 AM on 10/25/2021. Close monitoring for development of evidence of alcohol withdrawal, including close attention to trend in vital signs, as well as close monitoring of electrolytes, as described below.    Plan: counseled the patient on the importance of reduction in alcohol consumption. Consult to transition of care team placed. Close monitoring of ensuing BP and HR via routine VS. Symptoms-based CIWA protocol with prn Ativan ordered. Seizure precautions. Telemetry. Add-on serum Mg level. Check serum phosphorus level. Repeat CMP in the morning. Check INR.  Thiamine 100 mg IV x1 now followed by daily oral thiamine and/folic acid/multivitamin supplementation.       DVT prophylaxis: SCD's   Code Status: Full code Family Communication: none Disposition Plan: Per Rounding Team Consults called: none;  Admission status: Observation; med telemetry   PLEASE NOTE THAT DRAGON DICTATION SOFTWARE WAS USED IN THE CONSTRUCTION OF THIS NOTE.   Box DO [facility]  From [place]   10/26/2021, 12:37 AM

## 2021-10-27 ENCOUNTER — Inpatient Hospital Stay (HOSPITAL_COMMUNITY): Payer: Medicaid Other

## 2021-10-27 LAB — COMPREHENSIVE METABOLIC PANEL
ALT: 1230 U/L — ABNORMAL HIGH (ref 0–44)
ALT: 1045 U/L — ABNORMAL HIGH (ref 0–44)
AST: 3751 U/L — ABNORMAL HIGH (ref 15–41)
AST: 1833 U/L — ABNORMAL HIGH (ref 15–41)
Albumin: 3.1 g/dL — ABNORMAL LOW (ref 3.5–5.0)
Albumin: 3.2 g/dL — ABNORMAL LOW (ref 3.5–5.0)
Alkaline Phosphatase: 156 U/L — ABNORMAL HIGH (ref 38–126)
Alkaline Phosphatase: 176 U/L — ABNORMAL HIGH (ref 38–126)
Anion gap: 8 (ref 5–15)
Anion gap: 12 (ref 5–15)
BUN: 23 mg/dL (ref 8–23)
BUN: 25 mg/dL — ABNORMAL HIGH (ref 8–23)
CO2: 19 mmol/L — ABNORMAL LOW (ref 22–32)
CO2: 19 mmol/L — ABNORMAL LOW (ref 22–32)
Calcium: 7.9 mg/dL — ABNORMAL LOW (ref 8.9–10.3)
Calcium: 8.1 mg/dL — ABNORMAL LOW (ref 8.9–10.3)
Chloride: 106 mmol/L (ref 98–111)
Chloride: 104 mmol/L (ref 98–111)
Creatinine, Ser: 3.94 mg/dL — ABNORMAL HIGH (ref 0.61–1.24)
Creatinine, Ser: 4.52 mg/dL — ABNORMAL HIGH (ref 0.61–1.24)
GFR, Estimated: 16 mL/min — ABNORMAL LOW (ref >60)
GFR, Estimated: 14 mL/min — ABNORMAL LOW (ref >60)
Glucose, Bld: 102 mg/dL — ABNORMAL HIGH (ref 70–99)
Glucose, Bld: 102 mg/dL — ABNORMAL HIGH (ref 70–99)
Potassium: 4 mmol/L (ref 3.5–5.1)
Potassium: 4.2 mmol/L (ref 3.5–5.1)
Sodium: 133 mmol/L — ABNORMAL LOW (ref 135–145)
Sodium: 135 mmol/L (ref 135–145)
Total Bilirubin: 3.6 mg/dL — ABNORMAL HIGH (ref 0.3–1.2)
Total Bilirubin: 3.3 mg/dL — ABNORMAL HIGH (ref 0.3–1.2)
Total Protein: 5.7 g/dL — ABNORMAL LOW (ref 6.5–8.1)
Total Protein: 6 g/dL — ABNORMAL LOW (ref 6.5–8.1)

## 2021-10-27 LAB — OSMOLALITY, URINE: Osmolality, Ur: 221 mOsm/kg — ABNORMAL LOW (ref 300–900)

## 2021-10-27 LAB — CBC WITH DIFFERENTIAL/PLATELET
Abs Immature Granulocytes: 0.09 10*3/uL — ABNORMAL HIGH (ref 0.00–0.07)
Basophils Absolute: 0 10*3/uL (ref 0.0–0.1)
Basophils Relative: 0 %
Eosinophils Absolute: 0 10*3/uL (ref 0.0–0.5)
Eosinophils Relative: 0 %
HCT: 35 % — ABNORMAL LOW (ref 39.0–52.0)
Hemoglobin: 11.2 g/dL — ABNORMAL LOW (ref 13.0–17.0)
Immature Granulocytes: 1 %
Lymphocytes Relative: 7 %
Lymphs Abs: 0.8 10*3/uL (ref 0.7–4.0)
MCH: 20.9 pg — ABNORMAL LOW (ref 26.0–34.0)
MCHC: 32 g/dL (ref 30.0–36.0)
MCV: 65.4 fL — ABNORMAL LOW (ref 80.0–100.0)
Monocytes Absolute: 0.6 10*3/uL (ref 0.1–1.0)
Monocytes Relative: 5 %
Neutro Abs: 10.4 10*3/uL — ABNORMAL HIGH (ref 1.7–7.7)
Neutrophils Relative %: 87 %
Platelets: 127 10*3/uL — ABNORMAL LOW (ref 150–400)
RBC: 5.35 MIL/uL (ref 4.22–5.81)
RDW: 16.6 % — ABNORMAL HIGH (ref 11.5–15.5)
WBC: 12 10*3/uL — ABNORMAL HIGH (ref 4.0–10.5)
nRBC: 0.2 % (ref 0.0–0.2)

## 2021-10-27 LAB — CREATININE, URINE, RANDOM: Creatinine, Urine: 48.77 mg/dL

## 2021-10-27 LAB — HEPATITIS PANEL, ACUTE
HCV Ab: NONREACTIVE
Hep A IgM: NONREACTIVE
Hep B C IgM: NONREACTIVE
Hepatitis B Surface Ag: NONREACTIVE

## 2021-10-27 LAB — MAGNESIUM: Magnesium: 1.7 mg/dL (ref 1.7–2.4)

## 2021-10-27 LAB — CERULOPLASMIN: Ceruloplasmin: 16.3 mg/dL (ref 16.0–31.0)

## 2021-10-27 LAB — PHOSPHORUS: Phosphorus: 2.6 mg/dL (ref 2.5–4.6)

## 2021-10-27 LAB — SODIUM, URINE, RANDOM: Sodium, Ur: 65 mmol/L

## 2021-10-27 MED ORDER — SODIUM CHLORIDE 0.9 % IV SOLN
2.0000 g | INTRAVENOUS | Status: DC
  Administered 2021-10-27 – 2021-10-31 (×5): 2 g via INTRAVENOUS
  Filled 2021-10-27 (×5): qty 20

## 2021-10-27 MED ORDER — LACTATED RINGERS IV SOLN: INTRAVENOUS | Status: AC

## 2021-10-27 MED ORDER — OXYCODONE HCL 5 MG PO TABS
5.0000 mg | ORAL_TABLET | Freq: Once | ORAL | Status: AC
  Administered 2021-10-27: 5 mg via ORAL
  Filled 2021-10-27: qty 1

## 2021-10-27 NOTE — ED Notes (Signed)
Pt requesting something for headache and nausea. PRN meds given.

## 2021-10-27 NOTE — Progress Notes (Addendum)
[facility] Progress Note  Bradley Ray 66 y.o. October 03, 1956  CC:   Abnormal LFTs   Subjective: Patient seen and examined at bedside.  Feeling better today.  Denies any GI issues.  ROS : Afebrile, negative for chest pain   Objective: Vital signs in last 24 hours: Vitals:   10/27/21 1200 10/27/21 1335  BP: (!) 142/91 (!) 153/116  Pulse: 81 72  Resp: (!) 25 (!) 27  Temp:    SpO2: 95% 93%    Physical Exam:  General:  Alert, cooperative, no distress, appears stated age  Head:  Normocephalic, without obvious abnormality, atraumatic  Eyes:  , EOM's intact,   Lungs:   Clear to auscultation bilaterally, respirations unlabored  Heart:  Regular rate and rhythm, S1, S2 normal  Abdomen:   Soft, non-tender, nondistended, bowel sounds present, no peritoneal signs   Alert and oriented x3   Mood and affect normal    Lab Results: Recent Labs    10/26/21 0215 10/26/21 0500 10/27/21 0401  NA 136  --  133*  K 4.1  --  4.0  CL 102  --  106  CO2 20*  --  19*  GLUCOSE 91  --  102*  BUN 17  --  23  CREATININE 1.86*  --  3.94*  CALCIUM 7.8*  --  7.9*  MG 1.8  --  1.7  PHOS  --  3.0 2.6   Recent Labs    10/26/21 0215 10/27/21 0401  AST 6,742* 3,751*  ALT 1,936* 1,230*  ALKPHOS 104 156*  BILITOT 2.4* 3.6*  PROT 5.7* 5.7*  ALBUMIN 3.2* 3.1*   Recent Labs    10/26/21 0215 10/27/21 0401  WBC 6.6 12.0*  NEUTROABS 5.4 10.4*  HGB 12.1* 11.2*  HCT 36.2* 35.0*  MCV 65.3* 65.4*  PLT 156 127*   Recent Labs    10/25/21 2136 10/26/21 0500  LABPROT 17.4* 16.9*  INR 1.4* 1.4*      Assessment/Plan: -Abnormal LFTs with AST of 6742 with an ALT of 1936 yesterday.  LFTs improving today.  Hepatitis panel negative.  Normal ceruloplasmin.  Elevated iron saturation and elevated ferritin.  His ferritin was more than 7500 back in 2020.  Ultrasound right upper quadrant showed mild ascites otherwise no acute changes.  Recommendations ------------------------ -follow other  secondary markers -Continue to trend LFTs -Check for hemochromatosis gene evaluation -GI will follow   Otis Brace MD, FACP 10/27/2021, 2:47 PM  Contact #  [phone]

## 2021-10-27 NOTE — ED Notes (Signed)
Breakfast orders placed 

## 2021-10-27 NOTE — Progress Notes (Signed)
PROGRESS NOTE    Bradley Ray  WLN:[id number] DOB: 1955-12-02 DOA: 10/25/2021 PCP: Nolene Ebbs, MD   Brief Narrative:  Patient is a 66 year old African-American male with a past medical history significant for but not limited to chronic alcohol abuse, essential hypertension, hyperlipidemia as well as other comorbidities who presented with abdominal pain.  Who presented with 1 day of complaints of sharp abdominal pain located in epigastric area is right upper quadrant pain without radiation to the back or the flank.  He denies any nausea, vomiting, diarrhea, melena or hematochezia but did hav further worsened with palpation of the right upper quadrant/midepigastric area.  He does report that he typically consumes 4-5 shots of liquor on a daily basis and has been consuming alcohol FOR several months with his most recent alcohol consumption being on 11 AM 10/25/2021.  He also acknowledged a prior history of cocaine use and states his been multiple years since his most recent use.  He presented to the ED with his abdominal pain and was found to have significantly abnormal LFTs which are worsening.  He was given IV fluid hydration and he also was noted to have a lactic acid level was elevated at 4.0.  Imaging was done in the ED and a CT of the chest and abdomen pelvis was done and showed no evidence of aortic aneurysm, dissection or acute PE but it did show no evidence of gallbladder wall thickening, pericholecystic fluid, choledocholithiasis or common bile duct dilatation.  Right upper quadrant ultrasound was done and did show small amount of ascites but no evidence of cholelithiasis or evidence of acute cholecystitis.  His LFTs have significantly worsened since admission and trended up and his AST is now 6742 and his ALT is now 1936.  T bili also worsened to 2.4.  Lactic acid level was trending down slowly   Assessment & Plan:   Principal Problem:   Transaminitis Active Problems:   Essential  hypertension   High anion gap metabolic acidosis   Epigastric pain   Lactic acidosis   Hypokalemia   Hypocalcemia   Chronic alcohol abuse   HLD (hyperlipidemia)   Abnormal LFTs  Acute Hepatitis/Abnormal LFTs -He had an episode of epigastric discomfort and right upper quadrant abdominal pain and his AST and ALT have significantly worsened given his prior values -He had  previous episode of acute transaminitis back in April 2020 -CT of the abdomen pelvis showed mild diffuse infiltration of the liver parenchyma but no evidence of acute cholecystitis or choledocholithiasis and no dilatation of the common bile duct -LFTs had worsened and AST trended up to 6,742 and is now 3,751 -ALT peaked at 1936 and is now down to 1230 -T bili is trending up and went from 2.4 -> 3.6 -His viral hepatitis panel is negative -UDS is negative except cannabis -He is nonseptic appearing -Could have shock liver although his blood pressures were only mildly low -Upper quadrant ultrasound done and showed a small amount of ascites but no evidence of cholelithiasis or evidence of acute cholecystitis -Continue monitor and trend LFTs -Lipase was not elevated -Continue to hold his home atorvastatin for now -GGT was elevated at 525 -Repeat PT/INR in the morning -Alcohol cessation counseling given -GI consulted for further evaluation recommendations and they are concerned about some sort of hepatocellular process but his gallstone and biliary tract process is not completely excluded and they recommended checking hepatitis panel and other serologies and if possible get an MRI/MRCP and follow LFTs and PT/INR daily  Elevated anion gap metabolic acidosis, improving -Improving and trending downwards -In the setting of lactic acidosis likely in the setting of alcoholic lactic acidosis -Has no evidence of underlying infectious process at this time -Potential additional elevation of lactate could be in the setting of acute  hepatic insufficiency -Continue IV fluid hydration and increased lactated Ringer's to 100 mL/hr -Strict I's and O's and daily weights and repeat CBC, CMP in the morning   AKI, worsening  -Unclear etiology but likely related to Contrast -His BUN/creatinine went from 15/0.91 and is trended up to 17/1.86 -> 23/3.94 -CTA Abdomen and Pelvis done and showed "No evidence of aortic aneurysm or dissection. Moderate to marked severity coronary artery calcification. Mild, diffuse fatty infiltration of the liver parenchyma. Findings suggestive of mild to moderate severity cystitis and acute pyelonephritis. Correlation with urinalysis is recommended. 3 mm nonobstructing left renal calculus. Multiple prostate radiation implantation seeds." -Has no Urinary Symptoms;  -Urinalysis done and showed turbid appearance with moderate hemoglobin, no bacteria seen, 0-5 RBCs per high-power field, 0-5 WBCs -Strict I's and O's and daily weights -Avoid further nephrotoxic medications, contrast dyes, hypotension renally adjust medications -Check urine sodium, urine creatinine urine Osm and continue with IV fluid hydration as above getting 100 mL/hr -Renal U/S done and showed "Mildly increased echogenicity of the kidneys suggesting medical renal disease. No hydronephrosis." -Urine Osm was 221 and Na+ was 65 and Cr was 48.77 -Repeat CMP now   ?Pyelonephritis -Has no Urinary Symptoms but WBC has worsened and went from 6.6 -> 12.0 -Start Empiric Ceftriaxone 2 grams but low threshold to discontinue -CT Scan done and showed "Findings suggestive of mild to moderate severity cystitis and acute pyelonephritis. Correlation with urinalysis is  recommended. 3 mm nonobstructing left renal calculus." -U/A done and showed Hgb, Negative Ketones, No Bacteria, and 0-5 RBC/ HPF  -Repeat CMP in a.m.   Microcytic Anemia -Patient's hemoglobin/hematocrit went from 12.0/36.6 is now 12.1/36.2 -> 11.2/35.0 and he has an MCV of 65.4 -Checked  Anemia Panel and showed an iron level to 41, TIBC of 235, saturation ratios of 103, ferritin level 1268 -Continue to monitor for signs and symptoms of bleeding; currently no overt bleeding noted -Repeat CBC in a.m.   Hypokalemia -Mild and improved.  Patient's potassium went from 3.1 -> 4.1 -> 4.0 -Continue monitor and trend and repeat CMP in a.m.   Essential Hypertension -Blood pressure was elevated and improved; BP is now 138/80 -Resume home antihypertensive medications including amlodipine 5 mg daily, spironolactone 25 mg p.o. daily as well as metoprolol succinate 50 mg p.o. daily -Add IV hydralazine 10 mg every 6 as needed for systolic blood pressure greater than 409 or diastolic blood pressure greater 100  Leukocytosis -WBC went from 4.1 -> 6.6 -> 12.0 -Currently afebrile -Continue to Monitor for S/Sx for Infection -Repeat CBC in the AM  Thrombocytopenia -Patient's Platelet Count went from 196 -> 156 -> 127 -Continue to Monitor for S/Sx of Bleeding -Repeat CBC in the AM    HLD -Holding High Intensity Atorvastatin given Abnormal LFTs  Hyponatremia -Mild. Na+ went from 133 -> 136 -> 133 -Repeat CMP in the AM   Alcohol abuse with concern for withdrawal -Reports 4-5 shots of liquor every day specifically brandy his most recent alcohol consumption occurred 11 AM on 10/25/2021 prior to coming to ED -Ethanol level on admission was 38 -Initiated on CIWA score with as needed Ativan -Seizure precaution -Continue with oral thiamine, folic acid and multivitamin with minerals   DVT prophylaxis:  SCDs Code Status: FULL CODE  Family Communication: No family present at bedside Disposition Plan: Pending further clinical improvement and clearance by Gastroenterology  Status is: Inpatient  Remains inpatient appropriate because: LFTs are still significantly elevated and now Renal Fxn is worsening  Consultants:  Gastroenterology  Procedures: CT Scan  RENAL U/S  Antimicrobials:   Anti-infectives (From admission, onward)    Start     Dose/Rate Route Frequency Ordered Stop   10/27/21 1000  cefTRIAXone (ROCEPHIN) 2 g in sodium chloride 0.9 % 100 mL IVPB        2 g 200 mL/hr over 30 Minutes Intravenous Every 24 hours 10/27/21 0931          Subjective: Seen and examined at bedside and his abdominal pain and epigastric pain have improved.  No lightheadedness or dizziness.  No chest pain or shortness of breath.  Feels okay and his headache is improving.  Denies any urinary complaints.  Feels okay.  No other concerns or complaints this time.  Objective: Vitals:   10/27/21 1155 10/27/21 1200 10/27/21 1335 10/27/21 1450  BP: (!) 155/93 (!) 142/91 (!) 153/116 138/80  Pulse: 80 81 72 77  Resp:  (!) 25 (!) 27 (!) 24  Temp:      TempSrc:      SpO2:  95% 93% 93%    Intake/Output Summary (Last 24 hours) at 10/27/2021 1513 Last data filed at 10/27/2021 1115 Gross per 24 hour  Intake 1446.41 ml  Output --  Net 1446.41 ml   There were no vitals filed for this visit.  Examination: Physical Exam:  Constitutional: WN/WD chronically ill-appearing African-American male currently no acute distress appears calm Eyes: Conjunctivae are slightly icteric; lids normal ENMT: External Ears, Nose appear normal. Grossly normal hearing. Mucous membranes are moist.  Neck: Appears normal, supple, no cervical masses, normal ROM, no appreciable thyromegaly; no appreciable JVD Respiratory: Diminished to auscultation bilaterally, no wheezing, rales, rhonchi or crackles. Normal respiratory effort and patient is not tachypenic. No accessory muscle use.  Unlabored breathing Cardiovascular: RRR, no murmurs / rubs / gallops. S1 and S2 auscultated. No extremity edema.  Abdomen: Soft, non-tender, non-distended. Bowel sounds positive.  GU: Deferred. Musculoskeletal: No clubbing / cyanosis of digits/nails. Normal strength and muscle tone.  Skin: No rashes, lesions, ulcers on limited skin  evaluation. No induration; Warm and dry.  Neurologic: CN 2-12 grossly intact with no focal deficits. Romberg sign and cerebellar reflexes not assessed.  Psychiatric: Normal judgment and insight. Alert and oriented x 3. Normal mood and appropriate affect.   Data Reviewed: I have personally reviewed following labs and imaging studies  CBC: Recent Labs  Lab 10/25/21 1841 10/26/21 0215 10/27/21 0401  WBC 4.1 6.6 12.0*  NEUTROABS  --  5.4 10.4*  HGB 12.0* 12.1* 11.2*  HCT 36.6* 36.2* 35.0*  MCV 65.5* 65.3* 65.4*  PLT 196 156 841*   Basic Metabolic Panel: Recent Labs  Lab 10/25/21 1841 10/26/21 0215 10/26/21 0500 10/27/21 0401  NA 133* 136  --  133*  K 3.1* 4.1  --  4.0  CL 99 102  --  106  CO2 14* 20*  --  19*  GLUCOSE 73 91  --  102*  BUN 15 17  --  23  CREATININE 0.91 1.86*  --  3.94*  CALCIUM 7.9* 7.8*  --  7.9*  MG  --  1.8  --  1.7  PHOS  --   --  3.0 2.6   GFR: CrCl cannot be  calculated (Unknown ideal weight.). Liver Function Tests: Recent Labs  Lab 10/25/21 1841 10/26/21 0215 10/27/21 0401  AST 3,839* 6,742* 3,751*  ALT 1,356* 1,936* 1,230*  ALKPHOS 97 104 156*  BILITOT 2.0* 2.4* 3.6*  PROT 6.1* 5.7* 5.7*  ALBUMIN 3.5 3.2* 3.1*   Recent Labs  Lab 10/25/21 2136  LIPASE 30   No results for input(s): AMMONIA in the last 168 hours. Coagulation Profile: Recent Labs  Lab 10/25/21 2136 10/26/21 0500  INR 1.4* 1.4*   Cardiac Enzymes: Recent Labs  Lab 10/26/21 0500  CKTOTAL 57   BNP (last 3 results) No results for input(s): PROBNP in the last 8760 hours. HbA1C: No results for input(s): HGBA1C in the last 72 hours. CBG: No results for input(s): GLUCAP in the last 168 hours. Lipid Profile: No results for input(s): CHOL, HDL, LDLCALC, TRIG, CHOLHDL, LDLDIRECT in the last 72 hours. Thyroid Function Tests: Recent Labs    10/25/21 2136  TSH 0.521  FREET4 0.85   Anemia Panel: Recent Labs    10/26/21 0500  FERRITIN 1,268*  TIBC 235*  IRON  241*   Sepsis Labs: Recent Labs  Lab 10/26/21 0030 10/26/21 0215 10/26/21 0800  LATICACIDVEN 4.0* 4.1* 3.3*    Recent Results (from the past 240 hour(s))  Resp Panel by RT-PCR (Flu A&B, Covid) Nasopharyngeal Swab     Status: None   Collection Time: 10/26/21 12:55 AM   Specimen: Nasopharyngeal Swab; Nasopharyngeal(NP) swabs in vial transport medium  Result Value Ref Range Status   SARS Coronavirus 2 by RT PCR NEGATIVE NEGATIVE Final    Comment: (NOTE) SARS-CoV-2 target nucleic acids are NOT DETECTED.  The SARS-CoV-2 RNA is generally detectable in upper respiratory specimens during the acute phase of infection. The lowest concentration of SARS-CoV-2 viral copies this assay can detect is 138 copies/mL. A negative result does not preclude SARS-Cov-2 infection and should not be used as the sole basis for treatment or other patient management decisions. A negative result may occur with  improper specimen collection/handling, submission of specimen other than nasopharyngeal swab, presence of viral mutation(s) within the areas targeted by this assay, and inadequate number of viral copies(<138 copies/mL). A negative result must be combined with clinical observations, patient history, and epidemiological information. The expected result is Negative.  Fact Sheet for Patients:  EntrepreneurPulse.com.au  Fact Sheet for Healthcare Providers:  IncredibleEmployment.be  This test is no t yet approved or cleared by the [place] FDA and  has been authorized for detection and/or diagnosis of SARS-CoV-2 by FDA under an Emergency Use Authorization (EUA). This EUA will remain  in effect (meaning this test can be used) for the duration of the COVID-19 declaration under Section 564(b)(1) of the Act, 21 U.S.C.section 360bbb-3(b)(1), unless the authorization is terminated  or revoked sooner.       Influenza A by PCR NEGATIVE NEGATIVE Final   Influenza B  by PCR NEGATIVE NEGATIVE Final    Comment: (NOTE) The Xpert Xpress SARS-CoV-2/FLU/RSV plus assay is intended as an aid in the diagnosis of influenza from Nasopharyngeal swab specimens and should not be used as a sole basis for treatment. Nasal washings and aspirates are unacceptable for Xpert Xpress SARS-CoV-2/FLU/RSV testing.  Fact Sheet for Patients: EntrepreneurPulse.com.au  Fact Sheet for Healthcare Providers: IncredibleEmployment.be  This test is not yet approved or cleared by the [place] FDA and has been authorized for detection and/or diagnosis of SARS-CoV-2 by FDA under an Emergency Use Authorization (EUA). This EUA will remain in effect (meaning this  test can be used) for the duration of the COVID-19 declaration under Section 564(b)(1) of the Act, 21 U.S.C. section 360bbb-3(b)(1), unless the authorization is terminated or revoked.  Performed at [facility], [place] [address] [place], [place] [address]     RN Pressure Injury Documentation:     Estimated body mass index is 27.92 kg/m as calculated from the following:   Height as of 09/24/21: 5\' 6"  (1.676 m).   Weight as of 09/24/21: 78.5 kg.  Malnutrition Type:   Malnutrition Characteristics:   Nutrition Interventions:    Radiology Studies: DG Chest 2 View  Result Date: 10/25/2021 CLINICAL DATA:  Chest pain EXAM: CHEST - 2 VIEW COMPARISON:  01/01/2021 FINDINGS: The heart size and mediastinal contours are within normal limits. Both lungs are clear. The visualized skeletal structures are unremarkable. IMPRESSION: No active cardiopulmonary disease. Electronically Signed   By: Inez Catalina M.D.   On: 10/25/2021 21:20   US RENAL  Result Date: 10/27/2021 CLINICAL DATA:  Acute kidney insufficiency EXAM: RENAL / URINARY TRACT ULTRASOUND COMPLETE COMPARISON:  Abdominal ultrasound 10/26/2021 FINDINGS: Right Kidney: Renal measurements: 12.8 x 6.1 x 6.8 cm = volume: 280 mL.  Mildly increased echogenicity. No mass or hydronephrosis visualized. Left Kidney: Renal measurements: 12.5 x 5.6 x 5.9 cm = volume: 215 mL. Mildly increased echogenicity. No mass or hydronephrosis identified. Bladder: Incompletely distended with no focal mass identified. Other: None. IMPRESSION: Mildly increased echogenicity of the kidneys suggesting medical renal disease. No hydronephrosis. Electronically Signed   By: Ofilia Neas M.D.   On: 10/27/2021 10:13   CT Angio Chest/Abd/Pel for Dissection W and/or Wo Contrast  Result Date: 10/25/2021 CLINICAL DATA:  Chest pain with mid epigastric pain. EXAM: CT ANGIOGRAPHY CHEST, ABDOMEN AND PELVIS TECHNIQUE: Non-contrast CT of the chest was initially obtained. Multidetector CT imaging through the chest, abdomen and pelvis was performed using the standard protocol during bolus administration of intravenous contrast. Multiplanar reconstructed images and MIPs were obtained and reviewed to evaluate the vascular anatomy. RADIATION DOSE REDUCTION: This exam was performed according to the departmental dose-optimization program which includes automated exposure control, adjustment of the mA and/or kV according to patient size and/or use of iterative reconstruction technique. CONTRAST:  67mL OMNIPAQUE IOHEXOL 350 MG/ML SOLN COMPARISON:  January 01, 2021 FINDINGS: CTA CHEST FINDINGS Cardiovascular: There is very mild calcification of the aortic arch, without evidence of aortic aneurysm or dissection. Satisfactory opacification of the pulmonary arteries to the segmental level. No evidence of pulmonary embolism. Normal heart size with moderate to marked severity coronary artery calcification. No pericardial effusion. Mediastinum/Nodes: No enlarged mediastinal, hilar, or axillary lymph nodes. Thyroid gland, trachea, and esophagus demonstrate no significant findings. Lungs/Pleura: A small, stable, thin walled parenchymal cyst is seen within the posteromedial aspect of the right  middle lobe. There is no evidence of acute infiltrate, pleural effusion or pneumothorax. Musculoskeletal: No chest wall abnormality. No acute or significant osseous findings. Review of the MIP images confirms the above findings. CTA ABDOMEN AND PELVIS FINDINGS VASCULAR Aorta: Normal caliber aorta without aneurysm, dissection, vasculitis or significant stenosis. Celiac: Patent without evidence of aneurysm, dissection, vasculitis or significant stenosis. SMA: Patent without evidence of aneurysm, dissection, vasculitis or significant stenosis. Renals: Both renal arteries are patent without evidence of aneurysm, dissection, vasculitis, fibromuscular dysplasia or significant stenosis. IMA: Patent without evidence of aneurysm, dissection, vasculitis or significant stenosis. Inflow: Patent without evidence of aneurysm, dissection, vasculitis or significant stenosis. Veins: No obvious venous abnormality within the limitations of this arterial  phase study. Review of the MIP images confirms the above findings. NON-VASCULAR Hepatobiliary: There is mild, diffuse fatty infiltration of the liver parenchyma. No focal liver abnormality is seen. No gallstones, gallbladder wall thickening, or biliary dilatation. Pancreas: Unremarkable. No pancreatic ductal dilatation or surrounding inflammatory changes. Spleen: Normal in size without focal abnormality. Adrenals/Urinary Tract: There is mild diffuse enlargement of the left adrenal gland. The right adrenal gland is unremarkable. Kidneys are normal in size, without obstructing renal calculi, focal lesion, or hydronephrosis. A 3 mm nonobstructing renal calculus is seen within the mid to lower left kidney. Mild, bilateral perinephric inflammatory fat stranding is noted. This represents a new finding. There is mild to moderate severity diffuse urinary bladder wall thickening with a mild amount of surrounding inflammatory fat stranding. Stomach/Bowel: Stomach is within normal limits.  Appendix appears normal. No evidence of bowel wall thickening, distention, or inflammatory changes. Lymphatic: No abnormal abdominal or pelvic lymph nodes are identified. Reproductive: Multiple prostate radiation implantation seeds are seen. Other: No abdominal wall hernia or abnormality. No abdominopelvic ascites. Musculoskeletal: No acute or significant osseous findings. Review of the MIP images confirms the above findings. IMPRESSION: 1. No evidence of aortic aneurysm or dissection. 2. Moderate to marked severity coronary artery calcification. 3. Mild, diffuse fatty infiltration of the liver parenchyma. 4. Findings suggestive of mild to moderate severity cystitis and acute pyelonephritis. Correlation with urinalysis is recommended. 5. 3 mm nonobstructing left renal calculus. 6. Multiple prostate radiation implantation seeds. Aortic Atherosclerosis (ICD10-I70.0). Electronically Signed   By: Virgina Norfolk M.D.   On: 10/25/2021 21:11   US Abdomen Limited RUQ (LIVER/GB)  Result Date: 10/26/2021 CLINICAL DATA:  Elevated LFTs EXAM: ULTRASOUND ABDOMEN LIMITED RIGHT UPPER QUADRANT COMPARISON:  None. FINDINGS: Gallbladder: No gallstones or wall thickening visualized. No sonographic Murphy sign noted by sonographer. Common bile duct: Diameter: 4 mm Liver: No focal lesion identified. Within normal limits in parenchymal echogenicity. Portal vein is patent on color Doppler imaging with normal direction of blood flow towards the liver. Other: Small amount of ascites IMPRESSION: Small amount of ascites. No cholelithiasis or other evidence of acute cholecystitis. Electronically Signed   By: Ulyses Jarred M.D.   On: 10/26/2021 02:14    Scheduled Meds:  folic acid  1 mg Oral Daily   metoprolol succinate  50 mg Oral Daily   multivitamin with minerals  1 tablet Oral Daily   thiamine  100 mg Oral Daily   Or   thiamine  100 mg Intravenous Daily   Continuous Infusions:  cefTRIAXone (ROCEPHIN)  IV Stopped (10/27/21  1115)   lactated ringers 100 mL/hr at 10/27/21 0930    LOS: 1 day   Kerney Elbe, DO [facility] PAGER is on [place]  If 7PM-7AM, please contact night-coverage [email or web address]

## 2021-10-27 NOTE — ED Notes (Signed)
Pt started vomiting and complained of headache. CIWA was 15 at this time. Ativan 2mg  IVP given. Will continue to monitor.

## 2021-10-28 DIAGNOSIS — N179 Acute kidney failure, unspecified: Secondary | ICD-10-CM

## 2021-10-28 LAB — COMPREHENSIVE METABOLIC PANEL
ALT: 828 U/L — ABNORMAL HIGH (ref 0–44)
AST: 1115 U/L — ABNORMAL HIGH (ref 15–41)
Albumin: 3 g/dL — ABNORMAL LOW (ref 3.5–5.0)
Alkaline Phosphatase: 163 U/L — ABNORMAL HIGH (ref 38–126)
Anion gap: 12 (ref 5–15)
BUN: 23 mg/dL (ref 8–23)
CO2: 22 mmol/L (ref 22–32)
Calcium: 8.3 mg/dL — ABNORMAL LOW (ref 8.9–10.3)
Chloride: 104 mmol/L (ref 98–111)
Creatinine, Ser: 4.95 mg/dL — ABNORMAL HIGH (ref 0.61–1.24)
GFR, Estimated: 12 mL/min — ABNORMAL LOW (ref >60)
Glucose, Bld: 90 mg/dL (ref 70–99)
Potassium: 3.7 mmol/L (ref 3.5–5.1)
Sodium: 138 mmol/L (ref 135–145)
Total Bilirubin: 1.9 mg/dL — ABNORMAL HIGH (ref 0.3–1.2)
Total Protein: 5.8 g/dL — ABNORMAL LOW (ref 6.5–8.1)

## 2021-10-28 LAB — CBC WITH DIFFERENTIAL/PLATELET
Abs Immature Granulocytes: 0.09 10*3/uL — ABNORMAL HIGH (ref 0.00–0.07)
Basophils Absolute: 0.1 10*3/uL (ref 0.0–0.1)
Basophils Relative: 1 %
Eosinophils Absolute: 0.2 10*3/uL (ref 0.0–0.5)
Eosinophils Relative: 3 %
HCT: 35.3 % — ABNORMAL LOW (ref 39.0–52.0)
Hemoglobin: 11.5 g/dL — ABNORMAL LOW (ref 13.0–17.0)
Immature Granulocytes: 1 %
Lymphocytes Relative: 12 %
Lymphs Abs: 0.9 10*3/uL (ref 0.7–4.0)
MCH: 20.8 pg — ABNORMAL LOW (ref 26.0–34.0)
MCHC: 32.6 g/dL (ref 30.0–36.0)
MCV: 63.7 fL — ABNORMAL LOW (ref 80.0–100.0)
Monocytes Absolute: 0.6 10*3/uL (ref 0.1–1.0)
Monocytes Relative: 8 %
Neutro Abs: 5.2 10*3/uL (ref 1.7–7.7)
Neutrophils Relative %: 75 %
Platelets: 126 10*3/uL — ABNORMAL LOW (ref 150–400)
RBC: 5.54 MIL/uL (ref 4.22–5.81)
RDW: 17.2 % — ABNORMAL HIGH (ref 11.5–15.5)
WBC: 7 10*3/uL (ref 4.0–10.5)
nRBC: 0.3 % — ABNORMAL HIGH (ref 0.0–0.2)

## 2021-10-28 LAB — PHOSPHORUS: Phosphorus: 3.1 mg/dL (ref 2.5–4.6)

## 2021-10-28 LAB — ANA: Anti Nuclear Antibody (ANA): NEGATIVE

## 2021-10-28 LAB — HSV DNA BY PCR (REFERENCE LAB)
HSV 1 DNA: NEGATIVE
HSV 2 DNA: NEGATIVE

## 2021-10-28 LAB — EPSTEIN-BARR VIRUS VCA, IGM: EBV VCA IgM: <36 U/mL (ref 0.0–35.9)

## 2021-10-28 LAB — ANTI-SMOOTH MUSCLE ANTIBODY, IGG: F-Actin IgG: 4 Units (ref 0–19)

## 2021-10-28 LAB — MITOCHONDRIAL ANTIBODIES: Mitochondrial M2 Ab, IgG: <20 Units (ref 0.0–20.0)

## 2021-10-28 LAB — MAGNESIUM: Magnesium: 1.7 mg/dL (ref 1.7–2.4)

## 2021-10-28 LAB — CMV IGM: CMV IgM: <30 AU/mL (ref 0.0–29.9)

## 2021-10-28 MED ORDER — POLYETHYLENE GLYCOL 3350 17 G PO PACK
17.0000 g | PACK | Freq: Two times a day (BID) | ORAL | Status: DC
  Administered 2021-10-28 – 2021-10-31 (×4): 17 g via ORAL
  Filled 2021-10-28 (×6): qty 1

## 2021-10-28 MED ORDER — ENSURE ENLIVE PO LIQD
237.0000 mL | Freq: Two times a day (BID) | ORAL | Status: DC
  Administered 2021-10-29 – 2021-10-31 (×5): 237 mL via ORAL

## 2021-10-28 MED ORDER — SENNOSIDES-DOCUSATE SODIUM 8.6-50 MG PO TABS
1.0000 | ORAL_TABLET | Freq: Two times a day (BID) | ORAL | Status: DC
  Administered 2021-10-28 – 2021-10-31 (×6): 1 via ORAL
  Filled 2021-10-28 (×6): qty 1

## 2021-10-28 MED ORDER — ACETAMINOPHEN 325 MG PO TABS
650.0000 mg | ORAL_TABLET | Freq: Once | ORAL | Status: AC
  Administered 2021-10-28: 650 mg via ORAL
  Filled 2021-10-28: qty 2

## 2021-10-28 MED ORDER — AMLODIPINE BESYLATE 5 MG PO TABS
5.0000 mg | ORAL_TABLET | Freq: Every day | ORAL | Status: DC
  Administered 2021-10-28 – 2021-10-29 (×2): 5 mg via ORAL
  Filled 2021-10-28 (×2): qty 1

## 2021-10-28 NOTE — Progress Notes (Signed)
PROGRESS NOTE    Bradley Ray  VPX:[id number] DOB: [birth year] DOA: 10/25/2021 PCP: Nolene Ebbs, MD   Brief Narrative:  Patient is a 66 year old African-American male with a past medical history significant for but not limited to chronic alcohol abuse, essential hypertension, hyperlipidemia as well as other comorbidities who presented with abdominal pain.  Who presented with 1 day of complaints of sharp abdominal pain located in epigastric area is right upper quadrant pain without radiation to the back or the flank.  He denies any nausea, vomiting, diarrhea, melena or hematochezia but did hav further worsened with palpation of the right upper quadrant/midepigastric area.  He does report that he typically consumes 4-5 shots of liquor on a daily basis and has been consuming alcohol FOR several months with his most recent alcohol consumption being on 11 AM 10/25/2021.  He also acknowledged a prior history of cocaine use and states his been multiple years since his most recent use.  He presented to the ED with his abdominal pain and was found to have significantly abnormal LFTs which are worsening.  He was given IV fluid hydration and he also was noted to have a lactic acid level was elevated at 4.0.  Imaging was done in the ED and a CT of the chest and abdomen pelvis was done and showed no evidence of aortic aneurysm, dissection or acute PE but it did show no evidence of gallbladder wall thickening, pericholecystic fluid, choledocholithiasis or common bile duct dilatation.  Right upper quadrant ultrasound was done and did show small amount of ascites but no evidence of cholelithiasis or evidence of acute cholecystitis.  His LFTs have significantly worsened since admission and trended up and his AST was 6742 and his ALT was 1936 at his peak.  T bili also worsened to 2.4 and trended up to 3.6 but is now trending back down.  Lactic acid level was trending down slowly  Because his renal function worsened  significantly we consulted nephrology for further evaluation.  His LFTs are now trending down and T bili is also trending down now   Assessment & Plan:   Principal Problem:   Transaminitis Active Problems:   Essential hypertension   High anion gap metabolic acidosis   Epigastric pain   Lactic acidosis   Hypokalemia   Hypocalcemia   Chronic alcohol abuse   HLD (hyperlipidemia)   Abnormal LFTs  Acute Hepatitis/Abnormal LFTs -He had an episode of epigastric discomfort and right upper quadrant abdominal pain and his AST and ALT have significantly worsened given his prior values -He had  previous episode of acute transaminitis back in April 2020 -CT of the abdomen pelvis showed mild diffuse infiltration of the liver parenchyma but no evidence of acute cholecystitis or choledocholithiasis and no dilatation of the common bile duct -LFTs had worsened and AST trended up to 6,742 and is now 3,751 -ALT peaked at 1936 and is now down to 1230 -T bili is trending up and went from 2.4 -> 3.6 and is now trended back down to 1.9 -His viral hepatitis panel is negative -UDS is negative except cannabis -He is nonseptic appearing -Could have shock liver although his blood pressures were only mildly low -Upper quadrant ultrasound done and showed a small amount of ascites but no evidence of cholelithiasis or evidence of acute cholecystitis -Continue monitor and trend LFTs -Lipase was not elevated -Continue to hold his home atorvastatin for now -GGT was elevated at 525 -Repeat PT/INR in the morning at the last 1  was 16.9/1.4 respectively -Alcohol cessation counseling given -GI consulted for further evaluation recommendations and they are concerned about some sort of hepatocellular process but his gallstone and biliary tract process is not completely excluded and they recommended checking hepatitis panel and other serologies and if possible get an MRI/MRCP and follow LFTs and PT/INR daily -Patient's  other serologies have been negative so far with an ANA being negative and mitochondrial M2 antibody IgG being negative -Patient had a normal ceruloplasmin and elevated iron saturation elevated ferritin and ultrasound showed mild ascites.  LFTs continue to improve and GI recommends continue to follow and follow secondary markers along with hemochromatosis gene evaluation   Elevated anion gap metabolic acidosis, improving -Improving and trending downwards -In the setting of lactic acidosis likely in the setting of alcoholic lactic acidosis -Has no evidence of underlying infectious process at this time -His CO2 is now 22, anion gap is 12, chloride level is 104 -Potential additional elevation of lactate could be in the setting of acute hepatic insufficiency -Continue IV fluid hydration and increased lactated Ringer's to 100 mL/hr -Strict I's and O's and daily weights and repeat CBC, CMP in the morning   AKI, worsening  -Unclear etiology but likely related to Contrast -His BUN/creatinine went from 15/0.91 and is trended up to 17/1.86 -> 23/3.94 and trended up to 25/4.52 yesterday is now 23/4.95 -CTA Abdomen and Pelvis done and showed "No evidence of aortic aneurysm or dissection. Moderate to marked severity coronary artery calcification. Mild, diffuse fatty infiltration of the liver parenchyma. Findings suggestive of mild to moderate severity cystitis and acute pyelonephritis. Correlation with urinalysis is recommended. 3 mm nonobstructing left renal calculus. Multiple prostate radiation implantation seeds." -Has no Urinary Symptoms;  -Urinalysis done and showed turbid appearance with moderate hemoglobin, no bacteria seen, 0-5 RBCs per high-power field, 0-5 WBCs -Strict I's and O's and daily weights; he is +1.680 L -Avoid further nephrotoxic medications, contrast dyes, hypotension renally adjust medications -Check urine sodium, urine creatinine urine Osm and continue with IV fluid hydration as above  getting 100 mL/hr -Renal U/S done and showed "Mildly increased echogenicity of the kidneys suggesting medical renal disease. No hydronephrosis." -Urine Osm was 221 and Na+ was 65 and Cr was 48.77 -Repeat CMP in the a.m. -Because renal function continues to trend upwards we will consult nephrology for further evaluation and they feel that his AKI is in the setting of shock and contrast exposure -They are going to repeat an urinalysis as imaging was consistent with UTI but no symptoms to suggest this.  They are recommending continued supportive care and monitoring daily for dialysis needs and there is currently no indication given that he is nonoliguric and they are recommending to continue to maintain euvolemia  ?Pyelonephritis -Has no Urinary Symptoms but WBC has worsened and went from 6.6 -> 12.0 -Start Empiric Ceftriaxone 2 grams but low threshold to discontinue -CT Scan done and showed "Findings suggestive of mild to moderate severity cystitis and acute pyelonephritis. Correlation with urinalysis is  recommended. 3 mm nonobstructing left renal calculus." -U/A done and showed Hgb, Negative Ketones, No Bacteria, and 0-5 RBC/ HPF  -Repeat CMP in a.m.   Microcytic Anemia -Patient's hemoglobin/hematocrit went from 12.0/36.6 is now 12.1/36.2 -> 11.2/35.0 -> 11.5/35.3 and he has an MCV of 63.7 -Checked Anemia Panel and showed an iron level to 41, TIBC of 235, saturation ratios of 103, ferritin level 1268 -Continue to monitor for signs and symptoms of bleeding; currently no overt bleeding noted -Repeat CBC  in a.m.   Hypokalemia -Mild and improved.  Patient's potassium went from 3.1 -> 4.1 -> 4.0 -> 4.2 -> 3.7 -Continue monitor and trend and repeat CMP in a.m.   Essential Hypertension -Blood pressure was elevated and improved; BP is now 157/104 -Resume home antihypertensive medications including amlodipine 5 mg daily, spironolactone 25 mg p.o. daily as well as metoprolol succinate 50 mg p.o.  daily -Add IV hydralazine 10 mg every 6 as needed for systolic blood pressure greater than 269 or diastolic blood pressure greater 100  Leukocytosis -WBC went from 4.1 -> 6.6 -> 12.0 and is now trended down to 7.0 -Currently afebrile -Continue to Monitor for S/Sx for Infection -Repeat CBC in the AM  Thrombocytopenia -Patient's Platelet Count went from 196 -> 156 -> 127 and is now 126 -Continue to Monitor for S/Sx of Bleeding -Repeat CBC in the AM    HLD -Holding High Intensity Atorvastatin given Abnormal LFTs  Hyponatremia -Mild. Na+ went from 133 -> 136 -> 133 -> 135 -> 138 -Repeat CMP in the AM   Alcohol abuse with concern for withdrawal -Reports 4-5 shots of liquor every day specifically brandy his most recent alcohol consumption occurred 11 AM on 10/25/2021 prior to coming to ED -Ethanol level on admission was 36 -Initiated on CIWA score with as needed Ativan -Seizure precaution -Continue with oral thiamine, folic acid and multivitamin with minerals   DVT prophylaxis: SCDs Code Status: FULL CODE  Family Communication: Discussed with daughter at bedside Disposition Plan: Pending further clinical improvement and clearance by Gastroenterology  Status is: Inpatient  Remains inpatient appropriate because: LFTs are still significantly elevated and now Renal Fxn is worsening  Consultants:  Gastroenterology  Procedures: CT Scan  RENAL U/S  Antimicrobials:  Anti-infectives (From admission, onward)    Start     Dose/Rate Route Frequency Ordered Stop   10/27/21 1000  cefTRIAXone (ROCEPHIN) 2 g in sodium chloride 0.9 % 100 mL IVPB        2 g 200 mL/hr over 30 Minutes Intravenous Every 24 hours 10/27/21 0931          Subjective: Seen and examined at bedside and had a headache this morning.  Abdominal pain is improving.  States that he has been urinating.  No chest pain or shortness of breath.  Denies any other concerns or complaints at this time  Objective: Vitals:    10/28/21 0900 10/28/21 1100 10/28/21 1427 10/28/21 1443  BP: (!) 160/102 (!) 151/101 (!) 160/105 (!) 157/104  Pulse: 99  90   Resp: 16     Temp: 98.7 F (37.1 C)   98.9 F (37.2 C)  TempSrc: Oral   Oral  SpO2: 94%   93%  Weight:      Height:        Intake/Output Summary (Last 24 hours) at 10/28/2021 1702 Last data filed at 10/28/2021 0947 Gross per 24 hour  Intake 800 ml  Output 1250 ml  Net -450 ml    Filed Weights   10/28/21 0053 10/28/21 0523  Weight: 78.5 kg 78.5 kg   Examination: Physical Exam:  Constitutional: WN/WD chronically ill-appearing AAM in NAD Eyes:  Lids and conjunctivae normal, sclerae anicteric  ENMT: External Ears, Nose appear normal. Grossly normal hearing.  Neck: Appears normal, supple, no cervical masses, normal ROM, no appreciable thyromegaly; no JVD Respiratory: Diminished to auscultation bilaterally, no wheezing, rales, rhonchi or crackles. Normal respiratory effort and patient is not tachypenic. No accessory muscle use. Unlabored Breathing  Cardiovascular:  RRR, no murmurs / rubs / gallops. S1 and S2 auscultated. No extremity edema. Abdomen: Soft, non-tender, non-distended. Bowel sounds positive.  GU: Deferred. Musculoskeletal: No clubbing / cyanosis of digits/nails. No joint deformity upper and lower extremities.  Skin: No rashes, lesions, ulcers on a limited skin evaluation. No induration; Warm and dry.  Neurologic: CN 2-12 grossly intact with no focal deficits. Romberg sign and cerebellar reflexes not assessed.  Psychiatric: Normal judgment and insight. Alert and oriented x 3. Normal mood and appropriate affect.   Data Reviewed: I have personally reviewed following labs and imaging studies  CBC: Recent Labs  Lab 10/25/21 1841 10/26/21 0215 10/27/21 0401 10/28/21 0304  WBC 4.1 6.6 12.0* 7.0  NEUTROABS  --  5.4 10.4* 5.2  HGB 12.0* 12.1* 11.2* 11.5*  HCT 36.6* 36.2* 35.0* 35.3*  MCV 65.5* 65.3* 65.4* 63.7*  PLT 196 156 127* 126*     Basic Metabolic Panel: Recent Labs  Lab 10/25/21 1841 10/26/21 0215 10/26/21 0500 10/27/21 0401 10/27/21 1519 10/28/21 0304  NA 133* 136  --  133* 135 138  K 3.1* 4.1  --  4.0 4.2 3.7  CL 99 102  --  106 104 104  CO2 14* 20*  --  19* 19* 22  GLUCOSE 73 91  --  102* 102* 90  BUN 15 17  --  23 25* 23  CREATININE 0.91 1.86*  --  3.94* 4.52* 4.95*  CALCIUM 7.9* 7.8*  --  7.9* 8.1* 8.3*  MG  --  1.8  --  1.7  --  1.7  PHOS  --   --  3.0 2.6  --  3.1    GFR: Estimated Creatinine Clearance: 14.9 mL/min (A) (by C-G formula based on SCr of 4.95 mg/dL (H)). Liver Function Tests: Recent Labs  Lab 10/25/21 1841 10/26/21 0215 10/27/21 0401 10/27/21 1519 10/28/21 0304  AST 3,839* 6,742* 3,751* 1,833* 1,115*  ALT 1,356* 1,936* 1,230* 1,045* 828*  ALKPHOS 97 104 156* 176* 163*  BILITOT 2.0* 2.4* 3.6* 3.3* 1.9*  PROT 6.1* 5.7* 5.7* 6.0* 5.8*  ALBUMIN 3.5 3.2* 3.1* 3.2* 3.0*    Recent Labs  Lab 10/25/21 2136  LIPASE 30    No results for input(s): AMMONIA in the last 168 hours. Coagulation Profile: Recent Labs  Lab 10/25/21 2136 10/26/21 0500  INR 1.4* 1.4*    Cardiac Enzymes: Recent Labs  Lab 10/26/21 0500  CKTOTAL 57    BNP (last 3 results) No results for input(s): PROBNP in the last 8760 hours. HbA1C: No results for input(s): HGBA1C in the last 72 hours. CBG: No results for input(s): GLUCAP in the last 168 hours. Lipid Profile: No results for input(s): CHOL, HDL, LDLCALC, TRIG, CHOLHDL, LDLDIRECT in the last 72 hours. Thyroid Function Tests: Recent Labs    10/25/21 2136  TSH 0.521  FREET4 0.85    Anemia Panel: Recent Labs    10/26/21 0500  FERRITIN 1,268*  TIBC 235*  IRON 241*    Sepsis Labs: Recent Labs  Lab 10/26/21 0030 10/26/21 0215 10/26/21 0800  LATICACIDVEN 4.0* 4.1* 3.3*     Recent Results (from the past 240 hour(s))  Resp Panel by RT-PCR (Flu A&B, Covid) Nasopharyngeal Swab     Status: None   Collection Time: 10/26/21  12:55 AM   Specimen: Nasopharyngeal Swab; Nasopharyngeal(NP) swabs in vial transport medium  Result Value Ref Range Status   SARS Coronavirus 2 by RT PCR NEGATIVE NEGATIVE Final    Comment: (NOTE) SARS-CoV-2 target nucleic acids  are NOT DETECTED.  The SARS-CoV-2 RNA is generally detectable in upper respiratory specimens during the acute phase of infection. The lowest concentration of SARS-CoV-2 viral copies this assay can detect is 138 copies/mL. A negative result does not preclude SARS-Cov-2 infection and should not be used as the sole basis for treatment or other patient management decisions. A negative result may occur with  improper specimen collection/handling, submission of specimen other than nasopharyngeal swab, presence of viral mutation(s) within the areas targeted by this assay, and inadequate number of viral copies(<138 copies/mL). A negative result must be combined with clinical observations, patient history, and epidemiological information. The expected result is Negative.  Fact Sheet for Patients:  EntrepreneurPulse.com.au  Fact Sheet for Healthcare Providers:  IncredibleEmployment.be  This test is no t yet approved or cleared by the [place] FDA and  has been authorized for detection and/or diagnosis of SARS-CoV-2 by FDA under an Emergency Use Authorization (EUA). This EUA will remain  in effect (meaning this test can be used) for the duration of the COVID-19 declaration under Section 564(b)(1) of the Act, 21 U.S.C.section 360bbb-3(b)(1), unless the authorization is terminated  or revoked sooner.       Influenza A by PCR NEGATIVE NEGATIVE Final   Influenza B by PCR NEGATIVE NEGATIVE Final    Comment: (NOTE) The Xpert Xpress SARS-CoV-2/FLU/RSV plus assay is intended as an aid in the diagnosis of influenza from Nasopharyngeal swab specimens and should not be used as a sole basis for treatment. Nasal washings and aspirates  are unacceptable for Xpert Xpress SARS-CoV-2/FLU/RSV testing.  Fact Sheet for Patients: EntrepreneurPulse.com.au  Fact Sheet for Healthcare Providers: IncredibleEmployment.be  This test is not yet approved or cleared by the [place] FDA and has been authorized for detection and/or diagnosis of SARS-CoV-2 by FDA under an Emergency Use Authorization (EUA). This EUA will remain in effect (meaning this test can be used) for the duration of the COVID-19 declaration under Section 564(b)(1) of the Act, 21 U.S.C. section 360bbb-3(b)(1), unless the authorization is terminated or revoked.  Performed at [facility], [place] [address], [place], [place] [address]      RN Pressure Injury Documentation:     Estimated body mass index is 27.93 kg/m as calculated from the following:   Height as of this encounter: 5\' 6"  (1.676 m).   Weight as of this encounter: 78.5 kg.  Malnutrition Type:   Malnutrition Characteristics:   Nutrition Interventions:    Radiology Studies: US RENAL  Result Date: 10/27/2021 CLINICAL DATA:  Acute kidney insufficiency EXAM: RENAL / URINARY TRACT ULTRASOUND COMPLETE COMPARISON:  Abdominal ultrasound 10/26/2021 FINDINGS: Right Kidney: Renal measurements: 12.8 x 6.1 x 6.8 cm = volume: 280 mL. Mildly increased echogenicity. No mass or hydronephrosis visualized. Left Kidney: Renal measurements: 12.5 x 5.6 x 5.9 cm = volume: 215 mL. Mildly increased echogenicity. No mass or hydronephrosis identified. Bladder: Incompletely distended with no focal mass identified. Other: None. IMPRESSION: Mildly increased echogenicity of the kidneys suggesting medical renal disease. No hydronephrosis. Electronically Signed   By: Ofilia Neas M.D.   On: 10/27/2021 10:13    Scheduled Meds:  amLODipine  5 mg Oral Daily   feeding supplement  237 mL Oral BID BM   folic acid  1 mg Oral Daily   metoprolol succinate  50 mg Oral Daily    multivitamin with minerals  1 tablet Oral Daily   thiamine  100 mg Oral Daily   Or   thiamine  100 mg Intravenous  Daily   Continuous Infusions:  cefTRIAXone (ROCEPHIN)  IV 2 g (10/28/21 0947)    LOS: 2 days   Kerney Elbe, DO [facility] PAGER is on [place]  If 7PM-7AM, please contact night-coverage [email or web address]

## 2021-10-28 NOTE — Consult Note (Signed)
[facility]  INPATIENT CONSULTATION  Reason for Consultation: AKI Requesting Provider: Dr. Alfredia Ferguson  HPI: Bradley Ray is an 66 y.o. male with EtOH ongoing use, h/o BPH, cocaine use in remission, HL, HTN, prostate ca (s/p radioactive beads 2017) currently admitted for abdominal pain and nephrology is consulted re: AKI.   Presented 1/14 with 1 d h/o abd pain.  Initial BP 90s, had emesis that day and felt dry.  CTA r/o dissection 69mL contrast - no dissection, no e/o urinary obstruction, 64mm stone, ? UTI, no pancreatitis, +fatty liver.  GI consulted and following for transaminitis : initial AST 3839 peak 6742 now 1115; ALT ~1000, now 828, bilirubin peaked at 3.6, 1.9 today.  Thought to be EtOH + shock related but r/o hemochromatosis with ^ iron indices and h/o ferritin ^d.   Did have lactic acidosis initially 4.     Initial creatinine was ~ 1 and has trended up daily to 4.95 today.  He is nonoliguric with UOP 845mL yesterday.  UA 1/15 mod blood, protein 3+, no RBC,.  1/16 urine sodium 65, urine cr 49 --> FeNa 4%.  Endorses no NSAID use PTA.  On LR 100/hr.  No LUTs.  Urine volume and character normal he reports.  2 daughters bedside - says he's drinking ok but not eating.   PMH: Past Medical History:  Diagnosis Date   Alcoholism ([place])    per epic documentation long hx dependence--- (06-30-2019  per pt was drinking average 3 shots dialy until 03/ 2020, since then one beer weekly)   Arthritis    knees   Asthma    Beta thalassemia trait    per hematologist/ oncologist note- dr Dorita Sciara--  per blood work-up  dx 02/ 2017   Bladder neck contracture    BPH with obstruction/lower urinary tract symptoms    Chronic kidney disease    Hx: of Hemodyalysis   Cocaine abuse, in remission ([place])    06-30-2019   per pt states last used 12/ 2017   COVID 11/09/2019   loss of taste and smell and appetite x 14 days all symptoms resolved   ED (erectile dysfunction)    Hepatitis    hepatitis a age  89   History of acute renal failure 12/2018   kidneys fully recovered no nephrologist per pt   History of chest pain    01-06-2019 hospital admission-- dx acute renal failure, hypotensive with associated shock liver, SIRS with tachycardia/ tachyapnea,  chf diastolic  ;   [id number]  per pt no chest pain since and everything resolved , followed by pcp   Hyperlipidemia    Hypertension    Prostate cancer [facility]) urologist-  dr wrenn/  oncologist-  dr Tammi Klippel   dx 07-02-2016,  T1c,  Gleason 3+3,  PSA 6.48,  48.44cc   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 (previously dr Alvy Bimler [facility])   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  anticoagulated w/ xarelto until june 2017 changed to asa 162mg  daily/   (06-30-2019 per pt now taking ASA 81mg  )   Wears dentures    upper   PSH: Past Surgical History:  Procedure Laterality Date   CYSTOSCOPY N/A 10/15/2016   Procedure: CYSTOSCOPY FLEXIBLE;  Surgeon: [name], MD;  Location: [facility];  Service: Urology;  Laterality: N/A;  NO SEEDS FOUND IN BLADDER   CYSTOSCOPY N/A 08/01/2020   Procedure: CYSTOSCOPY/removal of bladder stone and bladder neck biopsy;  Surgeon: [name], MD;  Location[facility]  Service: Urology;  Laterality: N/A;   IR FLUORO GUIDE CV LINE RIGHT  01/09/2019   IR US GUIDE VASC ACCESS RIGHT  01/09/2019   ORIF LEFT ANKLE  1980's   PROSTATE BIOPSY     RADIOACTIVE SEED IMPLANT N/A 10/15/2016   Procedure: RADIOACTIVE SEED IMPLANT/BRACHYTHERAPY IMPLANT;  Surgeon: [name], MD;  Location: [facility];  Service: Urology;  Laterality: N/A;     66    SEEDS IMPLANTED   REPAIR EXTENSOR TENDON Right 06/02/2016   Procedure: RIGHT LONG FINGER EXTENSOR TENDON REPAIR;  Surgeon: Milly Jakob, MD;  Location: [facility];  Service: Orthopedics;  Laterality: Right;   TOTAL KNEE ARTHROPLASTY Right 2010  approx.   TOTAL KNEE ARTHROPLASTY Left 12/31/2020   Procedure: TOTAL KNEE ARTHROPLASTY;  Surgeon: [name], MD;   Location: [facility];  Service: Orthopedics;  Laterality: Left;   TRANSURETHRAL INCISION OF BLADDER NECK N/A 07/04/2019   Procedure: CYSTOSCOPY WITH INCISION OF BLADDER NECK CONTRACTURE, BALLOON DILITATION;  Surgeon: [name], MD;  Location[facility];  Service: Urology;  Laterality: N/A;   TRANSURETHRAL INCISION OF BLADDER NECK N/A 08/01/2020   Procedure: INCISION OF BLADDER NECK;  Surgeon: [name], MD;  Location: [facility];  Service: Urology;  Laterality: N/A;  1 HR   TRANSURETHRAL RESECTION OF PROSTATE N/A 06/16/2018   Procedure: TRANSURETHRAL RESECTION OF THE PROSTATE (TURP);  Surgeon: [name], MD;  Location[facility];  Service: Urology;  Laterality: N/A;    Past Medical History:  Diagnosis Date   Alcoholism ([place])    per epic documentation long hx dependence--- (06-30-2019  per pt was drinking average 3 shots dialy until 03/ 2020, since then one beer weekly)   Arthritis    knees   Asthma    Beta thalassemia trait    per hematologist/ oncologist note- dr Dorita Sciara--  per blood work-up  dx 02/ 2017   Bladder neck contracture    BPH with obstruction/lower urinary tract symptoms    Chronic kidney disease    Hx: of Hemodyalysis   Cocaine abuse, in remission ([place])    06-30-2019   per pt states last used 12/ 2017   COVID 11/09/2019   loss of taste and smell and appetite x 14 days all symptoms resolved   ED (erectile dysfunction)    Hepatitis    hepatitis a age 56   History of acute renal failure 12/2018   kidneys fully recovered no nephrologist per pt   History of chest pain    01-06-2019 hospital admission-- dx acute renal failure, hypotensive with associated shock liver, SIRS with tachycardia/ tachyapnea,  chf diastolic  ;   [id number]  per pt no chest pain since and everything resolved , followed by pcp   Hyperlipidemia    Hypertension    Prostate cancer [facility]) urologist-  dr wrenn/  oncologist-  dr Tammi Klippel   dx 07-02-2016,  T1c,  Gleason 3+3,  PSA  6.48,  48.44cc   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   Medications Prior to Admission  Medication Sig Dispense Refill   albuterol (PROVENTIL HFA;VENTOLIN HFA) 108 (90 Base) MCG/ACT inhaler Inhale 2 puffs into the  lungs every 6 (six) hours as needed for wheezing or shortness of breath.     amLODipine (NORVASC) 5 MG tablet Take 5 mg by mouth daily.     aspirin EC 81 MG tablet Take 1 tablet (81 mg total) by mouth in the morning and at bedtime. To prevent blood clots after surgery (Patient taking differently: Take 81 mg by mouth in the morning.) 60 tablet 0   atorvastatin (LIPITOR) 40 MG tablet Take 1 tablet (40 mg total) by mouth daily. 30 tablet 1   folic acid (FOLVITE) 1 MG tablet Take 1 tablet (1 mg total) by mouth daily. 30 tablet 0   metoprolol succinate (TOPROL-XL) 50 MG 24 hr tablet Take 50 mg by mouth daily.     Thiamine HCl (VITAMIN B1) 100 MG TABS Take 100 mg by mouth in the morning.     spironolactone (ALDACTONE) 25 MG tablet Take 1 tablet (25 mg total) by mouth daily. 90 tablet 3   thiamine (VITAMIN B-1) 100 MG tablet Take 100 mg by mouth daily.      ALLERGIES:   Allergies  Allergen Reactions   Morphine And Related Rash    FAM HX: Family History  Problem Relation Age of Onset   Arrhythmia Mother        Has pacer   Throat cancer Father    Pancreatic cancer Sister    Clotting disorder Sister        related knee surgeries   Cancer Sister        pancreatic   Sickle cell trait Son    Cancer Maternal Aunt    Cancer Maternal Uncle    Cancer Other     Social History:   reports that he quit smoking about 15 years ago. His smoking use included cigarettes. He has a 30.00 pack-year  smoking history. He has never used smokeless tobacco. He reports current alcohol use. He reports current drug use. Drugs: "Crack" cocaine and Marijuana.  ROS: +HA, nausea o/w neg  Blood pressure (!) 151/101, pulse 99, temperature 98.7 F (37.1 C), temperature source Oral, resp. rate 16, height 5\' 6"  (1.676 m), weight 78.5 kg, SpO2 94 %. PHYSICAL EXAM: Gen: lying flat in bed with washcloth on forehead for HA.  Somewhat sleepy but arousable  Eyes: EOMI ENT: MMM Neck: supple CV: RRR Abd:  soft, nontender to mild palpation Lungs: clear laterally GU: no foley Extr:  no edema Neuro: sleepy but arousable and oriented x 3 Skin:no rashes noted   Results for orders placed or performed during the hospital encounter of 10/25/21 (from the past 48 hour(s))  CBC with Differential/Platelet     Status: Abnormal   Collection Time: 10/27/21  4:01 AM  Result Value Ref Range   WBC 12.0 (H) 4.0 - 10.5 K/uL   RBC 5.35 4.22 - 5.81 MIL/uL   Hemoglobin 11.2 (L) 13.0 - 17.0 g/dL   HCT 35.0 (L) 39.0 - 52.0 %   MCV 65.4 (L) 80.0 - 100.0 fL   MCH 20.9 (L) 26.0 - 34.0 pg   MCHC 32.0 30.0 - 36.0 g/dL   RDW 16.6 (H) 11.5 - 15.5 %   Platelets 127 (L) 150 - 400 K/uL   nRBC 0.2 0.0 - 0.2 %   Neutrophils Relative % 87 %   Neutro Abs 10.4 (H) 1.7 - 7.7 K/uL   Lymphocytes Relative 7 %   Lymphs Abs 0.8 0.7 - 4.0 K/uL   Monocytes Relative 5 %   Monocytes Absolute 0.6 0.1 -  1.0 K/uL   Eosinophils Relative 0 %   Eosinophils Absolute 0.0 0.0 - 0.5 K/uL   Basophils Relative 0 %   Basophils Absolute 0.0 0.0 - 0.1 K/uL   Immature Granulocytes 1 %   Abs Immature Granulocytes 0.09 (H) 0.00 - 0.07 K/uL    Comment: Performed at [facility] [address], [place], [place] [address]  Comprehensive metabolic panel     Status: Abnormal   Collection Time: 10/27/21  4:01 AM  Result Value Ref Range   Sodium 133 (L) 135 - 145 mmol/L   Potassium 4.0 3.5 - 5.1 mmol/L   Chloride 106 98 - 111 mmol/L   CO2 19 (L) 22 -  32 mmol/L   Glucose, Bld 102 (H) 70 - 99 mg/dL    Comment: Glucose reference range applies only to samples taken after fasting for at least 8 hours.   BUN 23 8 - 23 mg/dL   Creatinine, Ser 3.94 (H) 0.61 - 1.24 mg/dL    Comment: DELTA CHECK NOTED   Calcium 7.9 (L) 8.9 - 10.3 mg/dL   Total Protein 5.7 (L) 6.5 - 8.1 g/dL   Albumin 3.1 (L) 3.5 - 5.0 g/dL   AST 3,751 (H) 15 - 41 U/L    Comment: RESULTS CONFIRMED BY MANUAL DILUTION   ALT 1,230 (H) 0 - 44 U/L   Alkaline Phosphatase 156 (H) 38 - 126 U/L   Total Bilirubin 3.6 (H) 0.3 - 1.2 mg/dL   GFR, Estimated 16 (L) >60 mL/min    Comment: (NOTE) Calculated using the CKD-EPI Creatinine Equation (2021)    Anion gap 8 5 - 15    Comment: Performed at [facility], [address] [place], [place] [address]  Magnesium     Status: None   Collection Time: 10/27/21  4:01 AM  Result Value Ref Range   Magnesium 1.7 1.7 - 2.4 mg/dL    Comment: Performed at [facility] [address], [place], [place] [address]  Phosphorus     Status: None   Collection Time: 10/27/21  4:01 AM  Result Value Ref Range   Phosphorus 2.6 2.5 - 4.6 mg/dL    Comment: Performed at [facility] [address], [place], [place] [address]  Hepatitis panel, acute     Status: None   Collection Time: 10/27/21  4:02 AM  Result Value Ref Range   Hepatitis B Surface Ag NON REACTIVE NON REACTIVE   HCV Ab NON REACTIVE NON REACTIVE    Comment: (NOTE) Nonreactive HCV antibody screen is consistent with no HCV infections,  unless recent infection is suspected or other evidence exists to indicate HCV infection.     Hep A IgM NON REACTIVE NON REACTIVE   Hep B C IgM NON REACTIVE NON REACTIVE    Comment: Performed at [facility], [place] [address], [place], [place] [address]  Anti-smooth muscle antibody, IgG     Status: None   Collection Time: 10/27/21  4:02 AM  Result Value Ref Range   F-Actin IgG 4 0 - 19 Units    Comment: (NOTE)                  Negative                     0 - 19                 Weak positive  20 - 30                 Moderate to strong positive     >30 Actin Antibodies are found in 52-85% of patients with autoimmune hepatitis or chronic active hepatitis and in 22% of patients with primary biliary cirrhosis. Performed At: [facility] [place], [place] [id number] Rush Farmer MD [id number]   Mitochondrial antibodies     Status: None   Collection Time: 10/27/21  4:02 AM  Result Value Ref Range   Mitochondrial M2 Ab, IgG <20.0 0.0 - 20.0 Units    Comment: (NOTE)                                Negative    0.0 - 20.0                                Equivocal  20.1 - 24.9                                Positive         >24.9 Mitochondrial (M2) Antibodies are found in 90-96% of patients with primary biliary cirrhosis. Performed At: [facility] [place], [place] [id number] Rush Farmer MD [id number]   CMV IgM     Status: None   Collection Time: 10/27/21  4:02 AM  Result Value Ref Range   CMV IgM <30.0 0.0 - 29.9 AU/mL    Comment: (NOTE)                                Negative         <30.0                                Equivocal  30.0 - 34.9                                Positive         >34.9 A positive result is generally indicative of acute infection, reactivation or persistent IgM production. Performed At: [facility] [place], [place] [id number] Rush Farmer MD [id number]   Creatinine, urine, random     Status: None   Collection Time: 10/27/21  9:40 AM  Result Value Ref Range   Creatinine, Urine 48.77 mg/dL    Comment: Performed at [facility], [place] [address], [place], [place] [address]  Sodium, urine, random     Status: None   Collection Time: 10/27/21  9:40 AM  Result Value Ref Range   Sodium, Ur 65 mmol/L    Comment: Performed at [facility] [address], [place], [place] [address]   Osmolality, urine     Status: Abnormal   Collection Time: 10/27/21  9:41 AM  Result Value Ref Range   Osmolality, Ur 221 (L) 300 - 900 mOsm/kg    Comment: Performed at [facility] [address], [place], [place] [address]  Comprehensive metabolic panel     Status: Abnormal   Collection Time: 10/27/21  3:19 PM  Result Value Ref Range  Sodium 135 135 - 145 mmol/L   Potassium 4.2 3.5 - 5.1 mmol/L   Chloride 104 98 - 111 mmol/L   CO2 19 (L) 22 - 32 mmol/L   Glucose, Bld 102 (H) 70 - 99 mg/dL    Comment: Glucose reference range applies only to samples taken after fasting for at least 8 hours.   BUN 25 (H) 8 - 23 mg/dL   Creatinine, Ser 4.52 (H) 0.61 - 1.24 mg/dL   Calcium 8.1 (L) 8.9 - 10.3 mg/dL   Total Protein 6.0 (L) 6.5 - 8.1 g/dL   Albumin 3.2 (L) 3.5 - 5.0 g/dL   AST 1,833 (H) 15 - 41 U/L   ALT 1,045 (H) 0 - 44 U/L   Alkaline Phosphatase 176 (H) 38 - 126 U/L   Total Bilirubin 3.3 (H) 0.3 - 1.2 mg/dL   GFR, Estimated 14 (L) >60 mL/min    Comment: (NOTE) Calculated using the CKD-EPI Creatinine Equation (2021)    Anion gap 12 5 - 15    Comment: Performed at [facility], [place] [address], [place], [place] [address]  CBC with Differential/Platelet     Status: Abnormal   Collection Time: 10/28/21  3:04 AM  Result Value Ref Range   WBC 7.0 4.0 - 10.5 K/uL   RBC 5.54 4.22 - 5.81 MIL/uL   Hemoglobin 11.5 (L) 13.0 - 17.0 g/dL   HCT 35.3 (L) 39.0 - 52.0 %   MCV 63.7 (L) 80.0 - 100.0 fL   MCH 20.8 (L) 26.0 - 34.0 pg   MCHC 32.6 30.0 - 36.0 g/dL   RDW 17.2 (H) 11.5 - 15.5 %   Platelets 126 (L) 150 - 400 K/uL   nRBC 0.3 (H) 0.0 - 0.2 %   Neutrophils Relative % 75 %   Neutro Abs 5.2 1.7 - 7.7 K/uL   Lymphocytes Relative 12 %   Lymphs Abs 0.9 0.7 - 4.0 K/uL   Monocytes Relative 8 %   Monocytes Absolute 0.6 0.1 - 1.0 K/uL   Eosinophils Relative 3 %   Eosinophils Absolute 0.2 0.0 - 0.5 K/uL   Basophils Relative 1 %   Basophils Absolute 0.1 0.0 - 0.1 K/uL   Immature  Granulocytes 1 %   Abs Immature Granulocytes 0.09 (H) 0.00 - 0.07 K/uL    Comment: Performed at [facility], [place] [address], [place], [place] [address]  Comprehensive metabolic panel     Status: Abnormal   Collection Time: 10/28/21  3:04 AM  Result Value Ref Range   Sodium 138 135 - 145 mmol/L   Potassium 3.7 3.5 - 5.1 mmol/L   Chloride 104 98 - 111 mmol/L   CO2 22 22 - 32 mmol/L   Glucose, Bld 90 70 - 99 mg/dL    Comment: Glucose reference range applies only to samples taken after fasting for at least 8 hours.   BUN 23 8 - 23 mg/dL   Creatinine, Ser 4.95 (H) 0.61 - 1.24 mg/dL   Calcium 8.3 (L) 8.9 - 10.3 mg/dL   Total Protein 5.8 (L) 6.5 - 8.1 g/dL   Albumin 3.0 (L) 3.5 - 5.0 g/dL   AST 1,115 (H) 15 - 41 U/L   ALT 828 (H) 0 - 44 U/L   Alkaline Phosphatase 163 (H) 38 - 126 U/L   Total Bilirubin 1.9 (H) 0.3 - 1.2 mg/dL   GFR, Estimated 12 (L) >60 mL/min    Comment: (NOTE) Calculated using the CKD-EPI Creatinine Equation (2021)  Anion gap 12 5 - 15    Comment: Performed at [facility] [address] [place], [place] [address]  Phosphorus     Status: None   Collection Time: 10/28/21  3:04 AM  Result Value Ref Range   Phosphorus 3.1 2.5 - 4.6 mg/dL    Comment: Performed at [facility] [address] [place], [place] [address]  Magnesium     Status: None   Collection Time: 10/28/21  3:04 AM  Result Value Ref Range   Magnesium 1.7 1.7 - 2.4 mg/dL    Comment: Performed at [facility] [address] [place], [place] [address]    US RENAL  Result Date: 10/27/2021 CLINICAL DATA:  Acute kidney insufficiency EXAM: RENAL / URINARY TRACT ULTRASOUND COMPLETE COMPARISON:  Abdominal ultrasound 10/26/2021 FINDINGS: Right Kidney: Renal measurements: 12.8 x 6.1 x 6.8 cm = volume: 280 mL. Mildly increased echogenicity. No mass or hydronephrosis visualized. Left Kidney: Renal measurements: 12.5 x 5.6 x 5.9 cm = volume: 215 mL. Mildly increased echogenicity. No  mass or hydronephrosis identified. Bladder: Incompletely distended with no focal mass identified. Other: None. IMPRESSION: Mildly increased echogenicity of the kidneys suggesting medical renal disease. No hydronephrosis. Electronically Signed   By: Ofilia Neas M.D.   On: 10/27/2021 10:13    Assessment/Plan **AKI, severe:  Pt with normal baseline kidney function now with severe AKI in the setting of shock, contrast exposure.  Will resend UA - imaging c/w UTI but no symptoms to suggest.  No obstruction on imaging.  At this point supportive care and monitor daily for need for dialysis.  Currently no indication for dialysis and his nonoliguric state is a good sign.  Will avoid further nephrotoxins, maintain euvolemia --> currently on LR 100/hr with reported not robust po intake so ok to continue unless develops s/s vol OL.    **Hepatitis:  thought to be shock and EtOH induced --> GI following along. ?hemochromatosis, gene being sent.  Improving.   **HTN: consistently HTN now - resume amlodipine 5mg  daily.  **possible pyelonephritis:  on ceftriaxone with imaging findings and leukocytosis.  Repeating UA.   **EtOH overuse: CIWA, vitamin repletion per primary.  Will follow - call with concerns.   Justin Mend 10/28/2021, 12:52 PM

## 2021-10-28 NOTE — Progress Notes (Signed)
[facility] Gastroenterology Progress Note  Bradley Ray 66 y.o. Sep 04, 1956  CC:   Abnormal LFTs   Subjective: Patient seen and examined at bedside.  He is complaining of nausea but denies any abdominal pain.  Denies diarrhea or constipation.  Denies bleeding  ROS : Afebrile, negative for chest pain   Objective: Vital signs in last 24 hours: Vitals:   10/28/21 0324 10/28/21 0523  BP: (!) 165/109 (!) 155/103  Pulse: 81 94  Resp: 18 18  Temp:  98.6 F (37 C)  SpO2: 96% 98%    Physical Exam:  General:  Alert, cooperative, no distress, appears stated age  Head:  Normocephalic, without obvious abnormality, atraumatic  Eyes:  , EOM's intact,   Lungs:   Clear to auscultation bilaterally, respirations unlabored  Heart:  Regular rate and rhythm, S1, S2 normal  Abdomen:   Soft, non-tender, nondistended, bowel sounds present, no peritoneal signs  Neuro Alert and oriented x3  Psych Mood and affect normal    Lab Results: Recent Labs    10/27/21 0401 10/27/21 1519 10/28/21 0304  NA 133* 135 138  K 4.0 4.2 3.7  CL 106 104 104  CO2 19* 19* 22  GLUCOSE 102* 102* 90  BUN 23 25* 23  CREATININE 3.94* 4.52* 4.95*  CALCIUM 7.9* 8.1* 8.3*  MG 1.7  --  1.7  PHOS 2.6  --  3.1   Recent Labs    10/27/21 1519 10/28/21 0304  AST 1,833* 1,115*  ALT 1,045* 828*  ALKPHOS 176* 163*  BILITOT 3.3* 1.9*  PROT 6.0* 5.8*  ALBUMIN 3.2* 3.0*   Recent Labs    10/27/21 0401 10/28/21 0304  WBC 12.0* 7.0  NEUTROABS 10.4* 5.2  HGB 11.2* 11.5*  HCT 35.0* 35.3*  MCV 65.4* 63.7*  PLT 127* 126*   Recent Labs    10/25/21 2136 10/26/21 0500  LABPROT 17.4* 16.9*  INR 1.4* 1.4*      Assessment/Plan: -Abnormal LFTs with AST of 6742 with an ALT of 1936 yesterday.  LFTs improving .  Most likely ischemic hepatitis with probability of underlying secondary disease.    Hepatitis panel negative.  Normal ceruloplasmin.  Elevated iron saturation and elevated ferritin.  His ferritin was more than  7500 back in 2020.  Ultrasound right upper quadrant showed mild ascites otherwise no acute changes.  Recommendations ------------------------ -LFTs continues to improve. -Follow other secondary markers along with hemochromatosis gene evaluation -GI will follow   Otis Brace MD, FACP 10/28/2021, 8:41 AM  Contact #  [phone]

## 2021-10-29 LAB — CBC WITH DIFFERENTIAL/PLATELET
Abs Immature Granulocytes: 0.12 10*3/uL — ABNORMAL HIGH (ref 0.00–0.07)
Basophils Absolute: 0.1 10*3/uL (ref 0.0–0.1)
Basophils Relative: 1 %
Eosinophils Absolute: 0.2 10*3/uL (ref 0.0–0.5)
Eosinophils Relative: 2 %
HCT: 35.3 % — ABNORMAL LOW (ref 39.0–52.0)
Hemoglobin: 11.4 g/dL — ABNORMAL LOW (ref 13.0–17.0)
Immature Granulocytes: 1 %
Lymphocytes Relative: 6 %
Lymphs Abs: 0.9 10*3/uL (ref 0.7–4.0)
MCH: 20.5 pg — ABNORMAL LOW (ref 26.0–34.0)
MCHC: 32.3 g/dL (ref 30.0–36.0)
MCV: 63.6 fL — ABNORMAL LOW (ref 80.0–100.0)
Monocytes Absolute: 1 10*3/uL (ref 0.1–1.0)
Monocytes Relative: 7 %
Neutro Abs: 11.7 10*3/uL — ABNORMAL HIGH (ref 1.7–7.7)
Neutrophils Relative %: 83 %
Platelets: 119 10*3/uL — ABNORMAL LOW (ref 150–400)
RBC: 5.55 MIL/uL (ref 4.22–5.81)
RDW: 17.9 % — ABNORMAL HIGH (ref 11.5–15.5)
WBC: 14 10*3/uL — ABNORMAL HIGH (ref 4.0–10.5)
nRBC: 0 % (ref 0.0–0.2)

## 2021-10-29 LAB — COMPREHENSIVE METABOLIC PANEL
ALT: 585 U/L — ABNORMAL HIGH (ref 0–44)
AST: 352 U/L — ABNORMAL HIGH (ref 15–41)
Albumin: 3.1 g/dL — ABNORMAL LOW (ref 3.5–5.0)
Alkaline Phosphatase: 137 U/L — ABNORMAL HIGH (ref 38–126)
Anion gap: 11 (ref 5–15)
BUN: 23 mg/dL (ref 8–23)
CO2: 22 mmol/L (ref 22–32)
Calcium: 8.4 mg/dL — ABNORMAL LOW (ref 8.9–10.3)
Chloride: 102 mmol/L (ref 98–111)
Creatinine, Ser: 4.88 mg/dL — ABNORMAL HIGH (ref 0.61–1.24)
GFR, Estimated: 13 mL/min — ABNORMAL LOW (ref >60)
Glucose, Bld: 102 mg/dL — ABNORMAL HIGH (ref 70–99)
Potassium: 4 mmol/L (ref 3.5–5.1)
Sodium: 135 mmol/L (ref 135–145)
Total Bilirubin: 1.1 mg/dL (ref 0.3–1.2)
Total Protein: 6.1 g/dL — ABNORMAL LOW (ref 6.5–8.1)

## 2021-10-29 LAB — MAGNESIUM: Magnesium: 1.5 mg/dL — ABNORMAL LOW (ref 1.7–2.4)

## 2021-10-29 LAB — PHOSPHORUS: Phosphorus: 2.6 mg/dL (ref 2.5–4.6)

## 2021-10-29 MED ORDER — ACETAMINOPHEN 325 MG PO TABS
650.0000 mg | ORAL_TABLET | Freq: Three times a day (TID) | ORAL | Status: DC | PRN
  Administered 2021-10-30 – 2021-10-31 (×2): 650 mg via ORAL
  Filled 2021-10-29 (×2): qty 2

## 2021-10-29 MED ORDER — AMLODIPINE BESYLATE 10 MG PO TABS
10.0000 mg | ORAL_TABLET | Freq: Every day | ORAL | Status: DC
  Administered 2021-10-30 – 2021-10-31 (×2): 10 mg via ORAL
  Filled 2021-10-29 (×2): qty 1

## 2021-10-29 MED ORDER — AMLODIPINE BESYLATE 5 MG PO TABS
5.0000 mg | ORAL_TABLET | Freq: Once | ORAL | Status: AC
  Administered 2021-10-29: 5 mg via ORAL
  Filled 2021-10-29: qty 1

## 2021-10-29 MED ORDER — TRAMADOL HCL 50 MG PO TABS
50.0000 mg | ORAL_TABLET | Freq: Two times a day (BID) | ORAL | Status: DC | PRN
  Administered 2021-10-29 – 2021-10-30 (×3): 50 mg via ORAL
  Filled 2021-10-29 (×3): qty 1

## 2021-10-29 NOTE — Progress Notes (Signed)
[facility] Gastroenterology Progress Note  Bradley Ray 66 y.o. 02/05/1956  CC:   Abnormal LFTs   Subjective: Patient seen and examined at bedside.  He is feeling better today.  Denies any further nausea.  Denies abdominal pain.  Denies diarrhea.  ROS : Afebrile, negative for chest pain   Objective: Vital signs in last 24 hours: Vitals:   10/29/21 0511 10/29/21 0830  BP: (!) 162/100 (!) 150/101  Pulse: 94 (!) 101  Resp: 20 17  Temp: (!) 97.5 F (36.4 C) 98.4 F (36.9 C)  SpO2: 94% 99%    Physical Exam:  General:  Alert, cooperative, no distress, appears stated age  Head:  Normocephalic, without obvious abnormality, atraumatic  Eyes:  , EOM's intact,   Lungs:   Clear to auscultation bilaterally, respirations unlabored  Heart:  Regular rate and rhythm, S1, S2 normal  Abdomen:   Soft, non-tender, nondistended, bowel sounds present, no peritoneal signs  Neuro Alert and oriented x3  Psych Mood and affect normal    Lab Results: Recent Labs    10/28/21 0304 10/29/21 0327  NA 138 135  K 3.7 4.0  CL 104 102  CO2 22 22  GLUCOSE 90 102*  BUN 23 23  CREATININE 4.95* 4.88*  CALCIUM 8.3* 8.4*  MG 1.7 1.5*  PHOS 3.1 2.6   Recent Labs    10/28/21 0304 10/29/21 0327  AST 1,115* 352*  ALT 828* 585*  ALKPHOS 163* 137*  BILITOT 1.9* 1.1  PROT 5.8* 6.1*  ALBUMIN 3.0* 3.1*   Recent Labs    10/28/21 0304 10/29/21 0327  WBC 7.0 14.0*  NEUTROABS 5.2 11.7*  HGB 11.5* 11.4*  HCT 35.3* 35.3*  MCV 63.7* 63.6*  PLT 126* 119*   No results for input(s): LABPROT, INR in the last 72 hours.     Assessment/Plan: -Abnormal LFTs with AST of 6742 with an ALT of 1936  on 10/26/2020 LFTs improving .  Most likely ischemic hepatitis with probability of underlying secondary disease.    Elevated iron saturation and elevated ferritin.  His ferritin was more than 7500 back in 2020.  Ultrasound right upper quadrant showed mild ascites otherwise no acute changes. -Elevated kidney  function.  Followed by nephrology.  Recommendations ------------------------ -LFTs continues to improve. -AMA, ASMA,CMV, HSV and EBV negative. Hepatitis panel negative.  Normal ceruloplasmin.  -Follow hemochromatosis gene evaluation -Alcohol abstinence discussed with the patient -GI will follow   Otis Brace MD, FACP 10/29/2021, 8:48 AM  Contact #  [phone]

## 2021-10-29 NOTE — Progress Notes (Signed)
PROGRESS NOTE  Bradley Ray  LKG:[id number] DOB: Jun 19, 1956 DOA: 10/25/2021 PCP: Nolene Ebbs, MD   Brief Narrative: Patient is a 66 year old African-American male with a past medical history significant for but not limited to chronic alcohol abuse, essential hypertension, hyperlipidemia as well as other comorbidities who presented with abdominal pain.  Who presented with 1 day of complaints of sharp abdominal pain located in epigastric area is right upper quadrant pain without radiation to the back or the flank.  He denies any nausea, vomiting, diarrhea, melena or hematochezia but did hav further worsened with palpation of the right upper quadrant/midepigastric area.  He does report that he typically consumes 4-5 shots of liquor on a daily basis and has been consuming alcohol FOR several months with his most recent alcohol consumption being on 11 AM 10/25/2021.  He also acknowledged a prior history of cocaine use and states his been multiple years since his most recent use.  He presented to the ED with his abdominal pain and was found to have significantly abnormal LFTs which are worsening.  He was given IV fluid hydration and he also was noted to have a lactic acid level was elevated at 4.0.  Imaging was done in the ED and a CT of the chest and abdomen pelvis was done and showed no evidence of aortic aneurysm, dissection or acute PE but it did show no evidence of gallbladder wall thickening, pericholecystic fluid, choledocholithiasis or common bile duct dilatation.  Right upper quadrant ultrasound was done and did show small amount of ascites but no evidence of cholelithiasis or evidence of acute cholecystitis.  His LFTs have significantly worsened since admission and trended up and his AST was 6742 and his ALT was 1936 at his peak.  T bili also worsened to 2.4 and trended up to 3.6 but is now trending back down.  Lactic acid level was trending down slowly   Because his renal function worsened  significantly we consulted nephrology for further evaluation.  His LFTs are now trending down and T bili is also trending down now   Assessment & Plan: Principal Problem:   Transaminitis Active Problems:   Essential hypertension   High anion gap metabolic acidosis   Epigastric pain   Lactic acidosis   Hypokalemia   Hypocalcemia   Chronic alcohol abuse   HLD (hyperlipidemia)   Abnormal LFTs  Elevated LFTs: Likely related to ischemic hepatitis with underlying hepatic steatosis +/- alcoholic hepatitis with AST predominance. Concern for hemochromatosis with history of grossly elevated ferritin in 2020 and now. Hepatitis work up including acute panel, ceruloplasmin, EBV, HSV, CMV, anti-mitochondrial and anti-smooth muscle Ab's have been negative.  - Encouraging alcohol cessation. The patient is ready to make that change.  - Trend LFTs, continuing to improve. Bilirubin now normal.  - F/u hemochromatosis DNA screen.   Acute renal failure: Non-oliguric, in fact having some subjective post-ATN diuresis. FENa was 4%. Contributions from hypotension, intravascular depletion, and contrast nephropathy with CTA on admission.   - Monitor I/O, will provide with IVF if po intake worsens. Otherwise, can avoid.  - Though CrCl previously felt to be normal, U/S with increased echogenicity suggests some chronicity. No hydro.   Headache: Acute on chronic:  - Continue tylenol at lower doses prn - Avoid NSAIDs. No hx seizure disorder, will trial tramadol 50mg  q12h prn.   EtOH abuse:  - Continue cessation efforts post discharge.  - Monitor with withdrawal.   HTN: Initially hypotensive.  - Resumed norvasc 5mg . BP remains  persistently elevated, increase to 10mg .  - Continue metoprolol - Avoiding spironolactone home med for now.  BPH, History of prostate CA: s/p radioactive beads 2017.  - Monitor for LUTS  Lactic acidosis: Resolved.   Cystitis/pyelonephritis: On CTX, which we'll continue with rising  WBC.  Hypokalemia: Improved.   Hyponatremia: Resolved  Thrombocytopenia: Likely related to liver disease as above.  - Monitor H/H, avoiding anticoagulation for now.   DVT prophylaxis: SCDs Code Status: Full Family Communication: None at bedside Disposition Plan:  Status is: Inpatient  Remains inpatient appropriate because: Ongoing work up, renal failure  Consultants:  Nephrology GI  Procedures:  None  Antimicrobials: Ceftriaxone   Subjective: Headache is global, constant, waxing/waning, improved with tylenol but never gone. No visual changes. Feels he's urinating a lot. Eating some.   Objective: Vitals:   10/28/21 2139 10/29/21 0500 10/29/21 0511 10/29/21 0830  BP: (!) 178/103  (!) 162/100 (!) 150/101  Pulse: 94  94 (!) 101  Resp: 18  20 17   Temp: 99.3 F (37.4 C)  (!) 97.5 F (36.4 C) 98.4 F (36.9 C)  TempSrc: Oral  Oral Oral  SpO2: 98%  94% 99%  Weight:  82 kg    Height:        Intake/Output Summary (Last 24 hours) at 10/29/2021 0953 Last data filed at 10/29/2021 0800 Gross per 24 hour  Intake 800 ml  Output --  Net 800 ml   Filed Weights   10/28/21 0053 10/28/21 0523 10/29/21 0500  Weight: 78.5 kg 78.5 kg 82 kg    Gen: 66 y.o. male in no distress, chronically ill-appearing Pulm: Non-labored breathing room air. Clear to auscultation bilaterally.  CV: Regular rate and rhythm. No murmur, rub, or gallop. No JVD, no pitting pedal edema. GI: Abdomen soft, non-tender, non-distended, with normoactive bowel sounds. No organomegaly or masses felt. Ext: Warm, no deformities Skin: Small closed wound on left upper back, otherwise no rashes, lesions or ulcers Neuro: Alert and oriented. No focal neurological deficits. Psych: Judgement and insight appear normal. Mood & affect appropriate.   Data Reviewed: I have personally reviewed following labs and imaging studies  CBC: Recent Labs  Lab 10/25/21 1841 10/26/21 0215 10/27/21 0401 10/28/21 0304  10/29/21 0327  WBC 4.1 6.6 12.0* 7.0 14.0*  NEUTROABS  --  5.4 10.4* 5.2 11.7*  HGB 12.0* 12.1* 11.2* 11.5* 11.4*  HCT 36.6* 36.2* 35.0* 35.3* 35.3*  MCV 65.5* 65.3* 65.4* 63.7* 63.6*  PLT 196 156 127* 126* 654*   Basic Metabolic Panel: Recent Labs  Lab 10/26/21 0215 10/26/21 0500 10/27/21 0401 10/27/21 1519 10/28/21 0304 10/29/21 0327  NA 136  --  133* 135 138 135  K 4.1  --  4.0 4.2 3.7 4.0  CL 102  --  106 104 104 102  CO2 20*  --  19* 19* 22 22  GLUCOSE 91  --  102* 102* 90 102*  BUN 17  --  23 25* 23 23  CREATININE 1.86*  --  3.94* 4.52* 4.95* 4.88*  CALCIUM 7.8*  --  7.9* 8.1* 8.3* 8.4*  MG 1.8  --  1.7  --  1.7 1.5*  PHOS  --  3.0 2.6  --  3.1 2.6   GFR: Estimated Creatinine Clearance: 15.4 mL/min (A) (by C-G formula based on SCr of 4.88 mg/dL (H)). Liver Function Tests: Recent Labs  Lab 10/26/21 0215 10/27/21 0401 10/27/21 1519 10/28/21 0304 10/29/21 0327  AST 6,742* 3,751* 1,833* 1,115* 352*  ALT 1,936* 1,230* 1,045*  828* 585*  ALKPHOS 104 156* 176* 163* 137*  BILITOT 2.4* 3.6* 3.3* 1.9* 1.1  PROT 5.7* 5.7* 6.0* 5.8* 6.1*  ALBUMIN 3.2* 3.1* 3.2* 3.0* 3.1*   Recent Labs  Lab 10/25/21 2136  LIPASE 30   No results for input(s): AMMONIA in the last 168 hours. Coagulation Profile: Recent Labs  Lab 10/25/21 2136 10/26/21 0500  INR 1.4* 1.4*   Cardiac Enzymes: Recent Labs  Lab 10/26/21 0500  CKTOTAL 57   BNP (last 3 results) No results for input(s): PROBNP in the last 8760 hours. HbA1C: No results for input(s): HGBA1C in the last 72 hours. CBG: No results for input(s): GLUCAP in the last 168 hours. Lipid Profile: No results for input(s): CHOL, HDL, LDLCALC, TRIG, CHOLHDL, LDLDIRECT in the last 72 hours. Thyroid Function Tests: No results for input(s): TSH, T4TOTAL, FREET4, T3FREE, THYROIDAB in the last 72 hours. Anemia Panel: No results for input(s): VITAMINB12, FOLATE, FERRITIN, TIBC, IRON, RETICCTPCT in the last 72 hours. Urine  analysis:    Component Value Date/Time   COLORURINE YELLOW 10/26/2021 0030   APPEARANCEUR TURBID (A) 10/26/2021 0030   LABSPEC 1.025 10/26/2021 0030   PHURINE 5.5 10/26/2021 0030   GLUCOSEU NEGATIVE 10/26/2021 0030   HGBUR MODERATE (A) 10/26/2021 0030   BILIRUBINUR NEGATIVE 10/26/2021 0030   KETONESUR NEGATIVE 10/26/2021 0030   PROTEINUR >300 (A) 10/26/2021 0030   NITRITE NEGATIVE 10/26/2021 0030   LEUKOCYTESUR NEGATIVE 10/26/2021 0030   Recent Results (from the past 240 hour(s))  Resp Panel by RT-PCR (Flu A&B, Covid) Nasopharyngeal Swab     Status: None   Collection Time: 10/26/21 12:55 AM   Specimen: Nasopharyngeal Swab; Nasopharyngeal(NP) swabs in vial transport medium  Result Value Ref Range Status   SARS Coronavirus 2 by RT PCR NEGATIVE NEGATIVE Final    Comment: (NOTE) SARS-CoV-2 target nucleic acids are NOT DETECTED.  The SARS-CoV-2 RNA is generally detectable in upper respiratory specimens during the acute phase of infection. The lowest concentration of SARS-CoV-2 viral copies this assay can detect is 138 copies/mL. A negative result does not preclude SARS-Cov-2 infection and should not be used as the sole basis for treatment or other patient management decisions. A negative result may occur with  improper specimen collection/handling, submission of specimen other than nasopharyngeal swab, presence of viral mutation(s) within the areas targeted by this assay, and inadequate number of viral copies(<138 copies/mL). A negative result must be combined with clinical observations, patient history, and epidemiological information. The expected result is Negative.  Fact Sheet for Patients:  EntrepreneurPulse.com.au  Fact Sheet for Healthcare Providers:  IncredibleEmployment.be  This test is no t yet approved or cleared by the [place] FDA and  has been authorized for detection and/or diagnosis of SARS-CoV-2 by FDA under an Emergency  Use Authorization (EUA). This EUA will remain  in effect (meaning this test can be used) for the duration of the COVID-19 declaration under Section 564(b)(1) of the Act, 21 U.S.C.section 360bbb-3(b)(1), unless the authorization is terminated  or revoked sooner.       Influenza A by PCR NEGATIVE NEGATIVE Final   Influenza B by PCR NEGATIVE NEGATIVE Final    Comment: (NOTE) The Xpert Xpress SARS-CoV-2/FLU/RSV plus assay is intended as an aid in the diagnosis of influenza from Nasopharyngeal swab specimens and should not be used as a sole basis for treatment. Nasal washings and aspirates are unacceptable for Xpert Xpress SARS-CoV-2/FLU/RSV testing.  Fact Sheet for Patients: EntrepreneurPulse.com.au  Fact Sheet for Healthcare Providers: IncredibleEmployment.be  This  test is not yet approved or cleared by the [place] and has been authorized for detection and/or diagnosis of SARS-CoV-2 by FDA under an Emergency Use Authorization (EUA). This EUA will remain in effect (meaning this test can be used) for the duration of the COVID-19 declaration under Section 564(b)(1) of the Act, 21 U.S.C. section 360bbb-3(b)(1), unless the authorization is terminated or revoked.  Performed at [facility], [place] [address], [place], [place] [address]       Radiology Studies: US RENAL  Result Date: 10/27/2021 CLINICAL DATA:  Acute kidney insufficiency EXAM: RENAL / URINARY TRACT ULTRASOUND COMPLETE COMPARISON:  Abdominal ultrasound 10/26/2021 FINDINGS: Right Kidney: Renal measurements: 12.8 x 6.1 x 6.8 cm = volume: 280 mL. Mildly increased echogenicity. No mass or hydronephrosis visualized. Left Kidney: Renal measurements: 12.5 x 5.6 x 5.9 cm = volume: 215 mL. Mildly increased echogenicity. No mass or hydronephrosis identified. Bladder: Incompletely distended with no focal mass identified. Other: None. IMPRESSION: Mildly increased echogenicity of the  kidneys suggesting medical renal disease. No hydronephrosis. Electronically Signed   By: Ofilia Neas M.D.   On: 10/27/2021 10:13    Scheduled Meds:  amLODipine  5 mg Oral Daily   feeding supplement  237 mL Oral BID BM   folic acid  1 mg Oral Daily   metoprolol succinate  50 mg Oral Daily   multivitamin with minerals  1 tablet Oral Daily   polyethylene glycol  17 g Oral BID   senna-docusate  1 tablet Oral BID   thiamine  100 mg Oral Daily   Or   thiamine  100 mg Intravenous Daily   Continuous Infusions:  cefTRIAXone (ROCEPHIN)  IV 2 g (10/28/21 0947)    Patrecia Pour, MD [facility] [email or web address] 10/29/2021, 9:53 AM

## 2021-10-29 NOTE — Progress Notes (Signed)
[facility] Progress Note   Subjective:   feeling improved globally today but still with dull frontal HA.  No family present this AM.  UOP 420mL yesterday, no diuretics.  Neg + 2.5L for admission  Objective Vitals:   10/28/21 2139 10/29/21 0500 10/29/21 0511 10/29/21 0830  BP: (!) 178/103  (!) 162/100 (!) 150/101  Pulse: 94  94 (!) 101  Resp: 18  20 17   Temp: 99.3 F (37.4 C)  (!) 97.5 F (36.4 C) 98.4 F (36.9 C)  TempSrc: Oral  Oral Oral  SpO2: 98%  94% 99%  Weight:  82 kg    Height:       Physical Exam Gen: lying flat in bed looking much more comfortable than yesterday Eyes: EOMI ENT: MMM Neck: supple CV: RRR Abd:  soft, nontender to mild palpation Lungs: clear laterally GU: no foley Extr:  no edema Neuro: awake, conversant Skin:no rashes noted  Additional Objective Labs: Basic Metabolic Panel: Recent Labs  Lab 10/27/21 0401 10/27/21 1519 10/28/21 0304 10/29/21 0327  NA 133* 135 138 135  K 4.0 4.2 3.7 4.0  CL 106 104 104 102  CO2 19* 19* 22 22  GLUCOSE 102* 102* 90 102*  BUN 23 25* 23 23  CREATININE 3.94* 4.52* 4.95* 4.88*  CALCIUM 7.9* 8.1* 8.3* 8.4*  PHOS 2.6  --  3.1 2.6   Liver Function Tests: Recent Labs  Lab 10/27/21 1519 10/28/21 0304 10/29/21 0327  AST 1,833* 1,115* 352*  ALT 1,045* 828* 585*  ALKPHOS 176* 163* 137*  BILITOT 3.3* 1.9* 1.1  PROT 6.0* 5.8* 6.1*  ALBUMIN 3.2* 3.0* 3.1*   Recent Labs  Lab 10/25/21 2136  LIPASE 30   CBC: Recent Labs  Lab 10/25/21 1841 10/25/21 1841 10/26/21 0215 10/27/21 0401 10/28/21 0304 10/29/21 0327  WBC 4.1  --  6.6 12.0* 7.0 14.0*  NEUTROABS  --    < > 5.4 10.4* 5.2 11.7*  HGB 12.0*  --  12.1* 11.2* 11.5* 11.4*  HCT 36.6*  --  36.2* 35.0* 35.3* 35.3*  MCV 65.5*  --  65.3* 65.4* 63.7* 63.6*  PLT 196  --  156 127* 126* 119*   < > = values in this interval not displayed.   Blood Culture    Component Value Date/Time   SDES URINE, CATHETERIZED 01/09/2019 0724    SPECREQUEST NONE 01/09/2019 0724   CULT  01/09/2019 0724    NO GROWTH Performed at [facility], [place] [address] [place], [place] [address]    REPTSTATUS 01/10/2019 FINAL 01/09/2019 0724    Cardiac Enzymes: Recent Labs  Lab 10/26/21 0500  CKTOTAL 57   CBG: No results for input(s): GLUCAP in the last 168 hours. Iron Studies: No results for input(s): IRON, TIBC, TRANSFERRIN, FERRITIN in the last 72 hours. @lablastinr3 @ Studies/Results: US RENAL  Result Date: 10/27/2021 CLINICAL DATA:  Acute kidney insufficiency EXAM: RENAL / URINARY TRACT ULTRASOUND COMPLETE COMPARISON:  Abdominal ultrasound 10/26/2021 FINDINGS: Right Kidney: Renal measurements: 12.8 x 6.1 x 6.8 cm = volume: 280 mL. Mildly increased echogenicity. No mass or hydronephrosis visualized. Left Kidney: Renal measurements: 12.5 x 5.6 x 5.9 cm = volume: 215 mL. Mildly increased echogenicity. No mass or hydronephrosis identified. Bladder: Incompletely distended with no focal mass identified. Other: None. IMPRESSION: Mildly increased echogenicity of the kidneys suggesting medical renal disease. No hydronephrosis. Electronically Signed   By: Ofilia Neas M.D.   On: 10/27/2021 10:13   Medications:  cefTRIAXone (ROCEPHIN)  IV 2 g (  10/29/21 0957)    [START ON 10/30/2021] amLODipine  10 mg Oral Daily   amLODipine  5 mg Oral Once   feeding supplement  237 mL Oral BID BM   folic acid  1 mg Oral Daily   metoprolol succinate  50 mg Oral Daily   multivitamin with minerals  1 tablet Oral Daily   polyethylene glycol  17 g Oral BID   senna-docusate  1 tablet Oral BID   thiamine  100 mg Oral Daily   Or   thiamine  100 mg Intravenous Daily    Assessment/Plan **AKI, severe:  Pt with normal baseline kidney function now with severe AKI in the setting of shock, contrast exposure.   No obstruction on imaging.  Renal function with modest improvement on labs today.  At this point continue supportive care and monitor daily for need for  dialysis but it looks like he'll probably avoid barring further insults.  Will avoid further nephrotoxins, maintain euvolemia. Hold IVF today as reporting better po intake.    **Hepatitis:  thought to be shock and EtOH induced --> GI following along.  W/u unrevealing except ?hemochromatosis, gene being sent.  Improving.    **HTN: consistently HTN now - amlodipine titrated.    **possible pyelonephritis:  on ceftriaxone with imaging findings and leukocytosis.    **EtOH overuse: CIWA, vitamin repletion per primary.   Will follow - call with concerns.   Jannifer Hick MD 10/29/2021, 10:07 AM  [facility] Pager: [phone]

## 2021-10-29 NOTE — Progress Notes (Signed)
CSW met with patient and completed CAGE Aid. Paitient reports willingness to abstain from substance use and accepted resources to assist with sobriety.

## 2021-10-29 NOTE — Plan of Care (Signed)
  Problem: Nutrition: Goal: Adequate nutrition will be maintained Outcome: Progressing   

## 2021-10-30 LAB — HEPATIC FUNCTION PANEL
ALT: 406 U/L — ABNORMAL HIGH (ref 0–44)
AST: 146 U/L — ABNORMAL HIGH (ref 15–41)
Albumin: 3.3 g/dL — ABNORMAL LOW (ref 3.5–5.0)
Alkaline Phosphatase: 128 U/L — ABNORMAL HIGH (ref 38–126)
Bilirubin, Direct: 0.2 mg/dL (ref 0.0–0.2)
Indirect Bilirubin: 0.6 mg/dL (ref 0.3–0.9)
Total Bilirubin: 0.8 mg/dL (ref 0.3–1.2)
Total Protein: 6.4 g/dL — ABNORMAL LOW (ref 6.5–8.1)

## 2021-10-30 LAB — RENAL FUNCTION PANEL
Albumin: 3.2 g/dL — ABNORMAL LOW (ref 3.5–5.0)
Anion gap: 10 (ref 5–15)
BUN: 27 mg/dL — ABNORMAL HIGH (ref 8–23)
CO2: 23 mmol/L (ref 22–32)
Calcium: 8.6 mg/dL — ABNORMAL LOW (ref 8.9–10.3)
Chloride: 103 mmol/L (ref 98–111)
Creatinine, Ser: 4.04 mg/dL — ABNORMAL HIGH (ref 0.61–1.24)
GFR, Estimated: 16 mL/min — ABNORMAL LOW (ref >60)
Glucose, Bld: 99 mg/dL (ref 70–99)
Phosphorus: 2.7 mg/dL (ref 2.5–4.6)
Potassium: 3.8 mmol/L (ref 3.5–5.1)
Sodium: 136 mmol/L (ref 135–145)

## 2021-10-30 MED ORDER — TRAZODONE HCL 50 MG PO TABS
50.0000 mg | ORAL_TABLET | Freq: Every evening | ORAL | Status: DC | PRN
  Administered 2021-10-30: 50 mg via ORAL
  Filled 2021-10-30: qty 1

## 2021-10-30 NOTE — Progress Notes (Signed)
PROGRESS NOTE  Bradley Ray  BOF:[id number] DOB: [birth year] DOA: 10/25/2021 PCP: Nolene Ebbs, MD   Brief Narrative: Patient is a 66 year old African-American male with a past medical history significant for but not limited to chronic alcohol abuse, essential hypertension, hyperlipidemia as well as other comorbidities who presented with abdominal pain.  Who presented with 1 day of complaints of sharp abdominal pain located in epigastric area is right upper quadrant pain without radiation to the back or the flank.  He denies any nausea, vomiting, diarrhea, melena or hematochezia but did hav further worsened with palpation of the right upper quadrant/midepigastric area.  He does report that he typically consumes 4-5 shots of liquor on a daily basis and has been consuming alcohol FOR several months with his most recent alcohol consumption being on 11 AM 10/25/2021.  He also acknowledged a prior history of cocaine use and states his been multiple years since his most recent use.  He presented to the ED with his abdominal pain and was found to have significantly abnormal LFTs which are worsening.  He was given IV fluid hydration and he also was noted to have a lactic acid level was elevated at 4.0.  Imaging was done in the ED and a CT of the chest and abdomen pelvis was done and showed no evidence of aortic aneurysm, dissection or acute PE but it did show no evidence of gallbladder wall thickening, pericholecystic fluid, choledocholithiasis or common bile duct dilatation.  Right upper quadrant ultrasound was done and did show small amount of ascites but no evidence of cholelithiasis or evidence of acute cholecystitis.  His LFTs have significantly worsened since admission and trended up and his AST was 6742 and his ALT was 1936 at his peak.  T bili also worsened to 2.4 and trended up to 3.6 but is now trending back down.  Lactic acid level was trending down slowly   Because his renal function worsened  significantly we consulted nephrology for further evaluation.  His LFTs are now trending down and T bili is also trending down now   Assessment & Plan: Principal Problem:   Transaminitis Active Problems:   Essential hypertension   High anion gap metabolic acidosis   Epigastric pain   Lactic acidosis   Hypokalemia   Hypocalcemia   Chronic alcohol abuse   HLD (hyperlipidemia)   Abnormal LFTs  Elevated LFTs: Likely related to ischemic hepatitis with underlying hepatic steatosis +/- alcoholic hepatitis with AST predominance. Concern for hemochromatosis with history of grossly elevated ferritin in 2020 and now. Hepatitis work up including acute panel, ceruloplasmin, EBV, HSV, CMV, anti-mitochondrial and anti-smooth muscle Ab's have been negative.  - Encouraging alcohol cessation. The patient is ready to make that change.  - Trend LFTs, continuing to improve.  - F/u hemochromatosis DNA screen. Still pending on last check.  Acute renal failure: Non-oliguric, in fact having some subjective post-ATN diuresis. FENa was 4%. Contributions from hypotension, intravascular depletion, and contrast nephropathy with CTA on admission.   - Monitor I/O, will provide with IVF if po intake worsens. Otherwise, can avoid.  - Recheck in AM. If ok with nephrology, may be eligible for discharge if CrCl continues improving. - Though CrCl previously felt to be normal, U/S with increased echogenicity suggests some chronicity. No hydro.   Headache: Acute on chronic: Improving overall. - Continue tylenol at lower doses prn - Avoid NSAIDs. No hx seizure disorder, continue tramadol 50mg  q12h prn.   EtOH abuse:  - Continue cessation efforts post  discharge.  - Monitor with withdrawal.   HTN: Initially hypotensive.  - Resumed norvasc 5mg  > increased to 10mg . control improved. - Continue metoprolol - Avoiding spironolactone home med for now.  BPH, History of prostate CA: s/p radioactive beads 2017.  - Monitor for  LUTS  Lactic acidosis: Resolved.   Cystitis/pyelonephritis: On CTX, which we'll continue with rising WBC. - Check leukocytosis in AM.  Hypokalemia: Improved.   Hyponatremia: Resolved  Thrombocytopenia: Likely related to liver disease as above.  - Monitor again in AM to confirm stability. Avoiding anticoagulation for now.   DVT prophylaxis: SCDs Code Status: Full Family Communication: None at bedside Disposition Plan:  Status is: Inpatient  Remains inpatient appropriate because: Ongoing work up, renal failure  Consultants:  Nephrology GI  Procedures:  None  Antimicrobials: Ceftriaxone   Subjective: No new complaints. No abd pain. Headache currently resolved, overall improved waxing/waning. Still urinating very often.   Objective: Vitals:   10/30/21 0446 10/30/21 0500 10/30/21 0915 10/30/21 1757  BP: (!) 136/94  131/88 139/88  Pulse: 86  76 78  Resp: 18  18 17   Temp: 97.9 F (36.6 C)  98.2 F (36.8 C) 98 F (36.7 C)  TempSrc: Oral     SpO2: 95%  95% 96%  Weight:  78.2 kg    Height:        Intake/Output Summary (Last 24 hours) at 10/30/2021 1840 Last data filed at 10/30/2021 1800 Gross per 24 hour  Intake 1300 ml  Output --  Net 1300 ml   Filed Weights   10/28/21 0523 10/29/21 0500 10/30/21 0500  Weight: 78.5 kg 82 kg 78.2 kg   Gen: 66 y.o. male in no distress Pulm: Nonlabored breathing room air. Clear. CV: Regular rate and rhythm. No murmur, rub, or gallop. No JVD, no dependent edema. GI: Abdomen soft, non-tender, non-distended, with normoactive bowel sounds.  Ext: Warm, no deformities Skin: No rashes, lesions or ulcers on visualized skin. Neuro: Alert and oriented. No focal neurological deficits. Psych: Judgement and insight appear fair. Mood euthymic & affect congruent. Behavior is appropriate.    Data Reviewed: I have personally reviewed following labs and imaging studies  CBC: Recent Labs  Lab 10/25/21 1841 10/26/21 0215 10/27/21 0401  10/28/21 0304 10/29/21 0327  WBC 4.1 6.6 12.0* 7.0 14.0*  NEUTROABS  --  5.4 10.4* 5.2 11.7*  HGB 12.0* 12.1* 11.2* 11.5* 11.4*  HCT 36.6* 36.2* 35.0* 35.3* 35.3*  MCV 65.5* 65.3* 65.4* 63.7* 63.6*  PLT 196 156 127* 126* 101*   Basic Metabolic Panel: Recent Labs  Lab 10/26/21 0215 10/26/21 0500 10/27/21 0401 10/27/21 1519 10/28/21 0304 10/29/21 0327 10/30/21 0214  NA 136  --  133* 135 138 135 136  K 4.1  --  4.0 4.2 3.7 4.0 3.8  CL 102  --  106 104 104 102 103  CO2 20*  --  19* 19* 22 22 23   GLUCOSE 91  --  102* 102* 90 102* 99  BUN 17  --  23 25* 23 23 27*  CREATININE 1.86*  --  3.94* 4.52* 4.95* 4.88* 4.04*  CALCIUM 7.8*  --  7.9* 8.1* 8.3* 8.4* 8.6*  MG 1.8  --  1.7  --  1.7 1.5*  --   PHOS  --  3.0 2.6  --  3.1 2.6 2.7   GFR: Estimated Creatinine Clearance: 18.2 mL/min (A) (by C-G formula based on SCr of 4.04 mg/dL (H)). Liver Function Tests: Recent Labs  Lab 10/27/21 0401 10/27/21  1519 10/28/21 0304 10/29/21 0327 10/30/21 0214  AST 3,751* 1,833* 1,115* 352* 146*  ALT 1,230* 1,045* 828* 585* 406*  ALKPHOS 156* 176* 163* 137* 128*  BILITOT 3.6* 3.3* 1.9* 1.1 0.8  PROT 5.7* 6.0* 5.8* 6.1* 6.4*  ALBUMIN 3.1* 3.2* 3.0* 3.1* 3.3*   3.2*   Recent Labs  Lab 10/25/21 2136  LIPASE 30   No results for input(s): AMMONIA in the last 168 hours. Coagulation Profile: Recent Labs  Lab 10/25/21 2136 10/26/21 0500  INR 1.4* 1.4*   Cardiac Enzymes: Recent Labs  Lab 10/26/21 0500  CKTOTAL 57   BNP (last 3 results) No results for input(s): PROBNP in the last 8760 hours. HbA1C: No results for input(s): HGBA1C in the last 72 hours. CBG: No results for input(s): GLUCAP in the last 168 hours. Lipid Profile: No results for input(s): CHOL, HDL, LDLCALC, TRIG, CHOLHDL, LDLDIRECT in the last 72 hours. Thyroid Function Tests: No results for input(s): TSH, T4TOTAL, FREET4, T3FREE, THYROIDAB in the last 72 hours. Anemia Panel: No results for input(s): VITAMINB12,  FOLATE, FERRITIN, TIBC, IRON, RETICCTPCT in the last 72 hours. Urine analysis:    Component Value Date/Time   COLORURINE YELLOW 10/26/2021 0030   APPEARANCEUR TURBID (A) 10/26/2021 0030   LABSPEC 1.025 10/26/2021 0030   PHURINE 5.5 10/26/2021 0030   GLUCOSEU NEGATIVE 10/26/2021 0030   HGBUR MODERATE (A) 10/26/2021 0030   BILIRUBINUR NEGATIVE 10/26/2021 0030   KETONESUR NEGATIVE 10/26/2021 0030   PROTEINUR >300 (A) 10/26/2021 0030   NITRITE NEGATIVE 10/26/2021 0030   LEUKOCYTESUR NEGATIVE 10/26/2021 0030   Recent Results (from the past 240 hour(s))  Resp Panel by RT-PCR (Flu A&B, Covid) Nasopharyngeal Swab     Status: None   Collection Time: 10/26/21 12:55 AM   Specimen: Nasopharyngeal Swab; Nasopharyngeal(NP) swabs in vial transport medium  Result Value Ref Range Status   SARS Coronavirus 2 by RT PCR NEGATIVE NEGATIVE Final    Comment: (NOTE) SARS-CoV-2 target nucleic acids are NOT DETECTED.  The SARS-CoV-2 RNA is generally detectable in upper respiratory specimens during the acute phase of infection. The lowest concentration of SARS-CoV-2 viral copies this assay can detect is 138 copies/mL. A negative result does not preclude SARS-Cov-2 infection and should not be used as the sole basis for treatment or other patient management decisions. A negative result may occur with  improper specimen collection/handling, submission of specimen other than nasopharyngeal swab, presence of viral mutation(s) within the areas targeted by this assay, and inadequate number of viral copies(<138 copies/mL). A negative result must be combined with clinical observations, patient history, and epidemiological information. The expected result is Negative.  Fact Sheet for Patients:  EntrepreneurPulse.com.au  Fact Sheet for Healthcare Providers:  IncredibleEmployment.be  This test is no t yet approved or cleared by the [place] FDA and  has been authorized  for detection and/or diagnosis of SARS-CoV-2 by FDA under an Emergency Use Authorization (EUA). This EUA will remain  in effect (meaning this test can be used) for the duration of the COVID-19 declaration under Section 564(b)(1) of the Act, 21 U.S.C.section 360bbb-3(b)(1), unless the authorization is terminated  or revoked sooner.       Influenza A by PCR NEGATIVE NEGATIVE Final   Influenza B by PCR NEGATIVE NEGATIVE Final    Comment: (NOTE) The Xpert Xpress SARS-CoV-2/FLU/RSV plus assay is intended as an aid in the diagnosis of influenza from Nasopharyngeal swab specimens and should not be used as a sole basis for treatment. Nasal washings and aspirates  are unacceptable for Xpert Xpress SARS-CoV-2/FLU/RSV testing.  Fact Sheet for Patients: EntrepreneurPulse.com.au  Fact Sheet for Healthcare Providers: IncredibleEmployment.be  This test is not yet approved or cleared by the [place] FDA and has been authorized for detection and/or diagnosis of SARS-CoV-2 by FDA under an Emergency Use Authorization (EUA). This EUA will remain in effect (meaning this test can be used) for the duration of the COVID-19 declaration under Section 564(b)(1) of the Act, 21 U.S.C. section 360bbb-3(b)(1), unless the authorization is terminated or revoked.  Performed at [facility], [place] [address] [place], [place] [address]       Radiology Studies: No results found.  Scheduled Meds:  amLODipine  10 mg Oral Daily   feeding supplement  237 mL Oral BID BM   folic acid  1 mg Oral Daily   metoprolol succinate  50 mg Oral Daily   multivitamin with minerals  1 tablet Oral Daily   polyethylene glycol  17 g Oral BID   senna-docusate  1 tablet Oral BID   thiamine  100 mg Oral Daily   Continuous Infusions:  cefTRIAXone (ROCEPHIN)  IV 2 g (10/30/21 1030)    Patrecia Pour, MD [facility] [email or web address] 10/30/2021, 6:40 PM

## 2021-10-30 NOTE — Progress Notes (Signed)
[facility] Gastroenterology Progress Note  Bradley Ray 66 y.o. 27-Jul-1956  CC:   Abnormal LFTs   Subjective: Patient seen and examined at bedside.  He is feeling better Denies any further nausea.  Denies abdominal pain.  Denies diarrhea.  ROS : Afebrile, negative for chest pain   Objective: Vital signs in last 24 hours: Vitals:   10/30/21 0446 10/30/21 0915  BP: (!) 136/94 131/88  Pulse: 86 76  Resp: 18 18  Temp: 97.9 F (36.6 C) 98.2 F (36.8 C)  SpO2: 95% 95%    Physical Exam:  General:  Alert, cooperative, no distress, appears stated age  Head:  Normocephalic, without obvious abnormality, atraumatic  Eyes:  , EOM's intact,   Lungs:   Clear to auscultation bilaterally, respirations unlabored  Heart:  Regular rate and rhythm, S1, S2 normal  Abdomen:   Soft, non-tender, nondistended, bowel sounds present, no peritoneal signs  Neuro Alert and oriented x3  Psych Mood and affect normal    Lab Results: Recent Labs    10/28/21 0304 10/29/21 0327 10/30/21 0214  NA 138 135 136  K 3.7 4.0 3.8  CL 104 102 103  CO2 22 22 23   GLUCOSE 90 102* 99  BUN 23 23 27*  CREATININE 4.95* 4.88* 4.04*  CALCIUM 8.3* 8.4* 8.6*  MG 1.7 1.5*  --   PHOS 3.1 2.6 2.7   Recent Labs    10/29/21 0327 10/30/21 0214  AST 352* 146*  ALT 585* 406*  ALKPHOS 137* 128*  BILITOT 1.1 0.8  PROT 6.1* 6.4*  ALBUMIN 3.1* 3.3*   3.2*   Recent Labs    10/28/21 0304 10/29/21 0327  WBC 7.0 14.0*  NEUTROABS 5.2 11.7*  HGB 11.5* 11.4*  HCT 35.3* 35.3*  MCV 63.7* 63.6*  PLT 126* 119*   No results for input(s): LABPROT, INR in the last 72 hours.     Assessment/Plan: -Abnormal LFTs with AST of 6742 with an ALT of 1936  on 10/26/2020 LFTs improving .  Most likely ischemic hepatitis with probability of underlying secondary disease.    Elevated iron saturation and elevated ferritin.  His ferritin was more than 7500 back in 2020.  Ultrasound right upper quadrant showed mild ascites otherwise no  acute changes. -Elevated kidney function.  Followed by nephrology.  Recommendations ------------------------ -LFTs continues to improve. -AMA, ASMA,CMV, HSV and EBV negative. Hepatitis panel negative.  Normal ceruloplasmin.  -Follow hemochromatosis gene evaluation -Alcohol abstinence discussed with the patient -No further inpatient GI work-up planned.  GI will sign off.  Follow-up in GI clinic in 2 months after discharge.   Otis Brace MD, [place] 10/30/2021, 10:53 AM  Contact #  [phone]

## 2021-10-30 NOTE — Progress Notes (Signed)
[facility] Progress Note   Subjective:   feeling improved globally today.  Good appetite, good UOP - 642mL yesterday, no LUTs.  Likely d/c tomorrow.  Objective Vitals:   10/29/21 1730 10/30/21 0446 10/30/21 0500 10/30/21 0915  BP: (!) 153/102 (!) 136/94  131/88  Pulse: 81 86  76  Resp: 17 18  18   Temp: 98.3 F (36.8 C) 97.9 F (36.6 C)  98.2 F (36.8 C)  TempSrc:  Oral    SpO2: 96% 95%  95%  Weight:   78.2 kg   Height:       Physical Exam Gen: lying flat in bed looking much more comfortable than yesterday Eyes: EOMI ENT: MMM Neck: supple CV: RRR Abd:  soft, nontender to mild palpation Lungs: clear laterally GU: no foley Extr:  no edema Neuro: awake, conversant Skin:no rashes noted  Additional Objective Labs: Basic Metabolic Panel: Recent Labs  Lab 10/28/21 0304 10/29/21 0327 10/30/21 0214  NA 138 135 136  K 3.7 4.0 3.8  CL 104 102 103  CO2 22 22 23   GLUCOSE 90 102* 99  BUN 23 23 27*  CREATININE 4.95* 4.88* 4.04*  CALCIUM 8.3* 8.4* 8.6*  PHOS 3.1 2.6 2.7    Liver Function Tests: Recent Labs  Lab 10/28/21 0304 10/29/21 0327 10/30/21 0214  AST 1,115* 352* 146*  ALT 828* 585* 406*  ALKPHOS 163* 137* 128*  BILITOT 1.9* 1.1 0.8  PROT 5.8* 6.1* 6.4*  ALBUMIN 3.0* 3.1* 3.3*   3.2*    Recent Labs  Lab 10/25/21 2136  LIPASE 30    CBC: Recent Labs  Lab 10/25/21 1841 10/25/21 1841 10/26/21 0215 10/27/21 0401 10/28/21 0304 10/29/21 0327  WBC 4.1  --  6.6 12.0* 7.0 14.0*  NEUTROABS  --    < > 5.4 10.4* 5.2 11.7*  HGB 12.0*  --  12.1* 11.2* 11.5* 11.4*  HCT 36.6*  --  36.2* 35.0* 35.3* 35.3*  MCV 65.5*  --  65.3* 65.4* 63.7* 63.6*  PLT 196  --  156 127* 126* 119*   < > = values in this interval not displayed.    Blood Culture    Component Value Date/Time   SDES URINE, CATHETERIZED 01/09/2019 0724   SPECREQUEST NONE 01/09/2019 0724   CULT  01/09/2019 0724    NO GROWTH Performed at [facility], [place] [address], [place], [place] [address]    REPTSTATUS 01/10/2019 FINAL 01/09/2019 0724    Cardiac Enzymes: Recent Labs  Lab 10/26/21 0500  CKTOTAL 57    CBG: No results for input(s): GLUCAP in the last 168 hours. Iron Studies: No results for input(s): IRON, TIBC, TRANSFERRIN, FERRITIN in the last 72 hours. @lablastinr3 @ Studies/Results: No results found. Medications:  cefTRIAXone (ROCEPHIN)  IV 2 g (10/29/21 0957)    amLODipine  10 mg Oral Daily   feeding supplement  237 mL Oral BID BM   folic acid  1 mg Oral Daily   metoprolol succinate  50 mg Oral Daily   multivitamin with minerals  1 tablet Oral Daily   polyethylene glycol  17 g Oral BID   senna-docusate  1 tablet Oral BID   thiamine  100 mg Oral Daily    Assessment/Plan **AKI, severe:  Pt with normal baseline kidney function now with severe AKI in the setting of shock, contrast exposure.   No obstruction on imaging.  Renal function with modest improvement on labs today.  At this point continue supportive care and monitor daily  for need for dialysis but it looks like he'll probably avoid barring further insults.  Will avoid further nephrotoxins, maintain euvolemia. Hold IVF today as reporting better po intake.    **Hepatitis:  thought to be shock and EtOH induced --> GI following along.  W/u unrevealing except ?hemochromatosis, gene being sent.  Improving.    **HTN: normotensive now on amlodipine.    **possible pyelonephritis:  on ceftriaxone with imaging findings and leukocytosis.    **EtOH overuse: s/p CIWA, vitamin repletion per primary.   Will follow - call with concerns.   Jannifer Hick MD 10/30/2021, 9:50 AM  [facility] Pager: [phone]

## 2021-10-31 LAB — COMPREHENSIVE METABOLIC PANEL
ALT: 333 U/L — ABNORMAL HIGH (ref 0–44)
AST: 74 U/L — ABNORMAL HIGH (ref 15–41)
Albumin: 3.8 g/dL (ref 3.5–5.0)
Alkaline Phosphatase: 127 U/L — ABNORMAL HIGH (ref 38–126)
Anion gap: 12 (ref 5–15)
BUN: 23 mg/dL (ref 8–23)
CO2: 25 mmol/L (ref 22–32)
Calcium: 9.5 mg/dL (ref 8.9–10.3)
Chloride: 101 mmol/L (ref 98–111)
Creatinine, Ser: 2.84 mg/dL — ABNORMAL HIGH (ref 0.61–1.24)
GFR, Estimated: 24 mL/min — ABNORMAL LOW (ref >60)
Glucose, Bld: 105 mg/dL — ABNORMAL HIGH (ref 70–99)
Potassium: 4 mmol/L (ref 3.5–5.1)
Sodium: 138 mmol/L (ref 135–145)
Total Bilirubin: 0.7 mg/dL (ref 0.3–1.2)
Total Protein: 7.4 g/dL (ref 6.5–8.1)

## 2021-10-31 LAB — CBC
HCT: 40.7 % (ref 39.0–52.0)
Hemoglobin: 12.8 g/dL — ABNORMAL LOW (ref 13.0–17.0)
MCH: 20.3 pg — ABNORMAL LOW (ref 26.0–34.0)
MCHC: 31.4 g/dL (ref 30.0–36.0)
MCV: 64.5 fL — ABNORMAL LOW (ref 80.0–100.0)
Platelets: 144 10*3/uL — ABNORMAL LOW (ref 150–400)
RBC: 6.31 MIL/uL — ABNORMAL HIGH (ref 4.22–5.81)
RDW: 19.5 % — ABNORMAL HIGH (ref 11.5–15.5)
WBC: 8.1 10*3/uL (ref 4.0–10.5)
nRBC: 0 % (ref 0.0–0.2)

## 2021-10-31 MED ORDER — ACETAMINOPHEN 325 MG PO TABS: 650.0000 mg | ORAL_TABLET | Freq: Three times a day (TID) | ORAL | 0 refills | Status: DC | PRN

## 2021-10-31 MED ORDER — TRAMADOL HCL 50 MG PO TABS: 50.0000 mg | ORAL_TABLET | Freq: Two times a day (BID) | ORAL | 0 refills | Status: AC | PRN

## 2021-10-31 MED ORDER — AMLODIPINE BESYLATE 10 MG PO TABS: 10.0000 mg | ORAL_TABLET | Freq: Every day | ORAL | 0 refills | Status: AC

## 2021-10-31 MED ORDER — CEPHALEXIN 500 MG PO CAPS
500.0000 mg | ORAL_CAPSULE | Freq: Two times a day (BID) | ORAL | 0 refills | Status: AC
Start: 2021-10-31 — End: 2021-11-09

## 2021-10-31 NOTE — Discharge Summary (Signed)
Physician Discharge Summary  Bradley Ray MBW:[id number] DOB: January 19, 1956 DOA: 10/25/2021  PCP: Nolene Ebbs, MD  Admit date: 10/25/2021 Discharge date: 10/31/2021  Admitted From: Home Disposition: Home   Recommendations for Outpatient Follow-up:  Follow up with PCP in 1-2 weeks with repeat CMP and CBC.  Follow up with [facility] for work up of liver disease/elevated LFTs. Hemochromatosis DNA pending at discharge.   Home Health: None Equipment/Devices: None Discharge Condition: Stable CODE STATUS: Full Diet recommendation: Renal, heart healthy  Brief/Interim Summary: Patient is a 66 year old African-American male with a past medical history significant for but not limited to chronic alcohol abuse, essential hypertension, hyperlipidemia as well as other comorbidities who presented with abdominal pain.  Who presented with 1 day of complaints of sharp abdominal pain located in epigastric area is right upper quadrant pain without radiation to the back or the flank.  He denies any nausea, vomiting, diarrhea, melena or hematochezia but did hav further worsened with palpation of the right upper quadrant/midepigastric area.  He does report that he typically consumes 4-5 shots of liquor on a daily basis and has been consuming alcohol FOR several months with his most recent alcohol consumption being on 11 AM 10/25/2021.  He also acknowledged a prior history of cocaine use and states his been multiple years since his most recent use.  He presented to the ED with his abdominal pain and was found to have significantly abnormal LFTs which are worsening.  He was given IV fluid hydration and he also was noted to have a lactic acid level was elevated at 4.0.  Imaging was done in the ED and a CT of the chest and abdomen pelvis was done and showed no evidence of aortic aneurysm, dissection or acute PE but it did show no evidence of gallbladder wall thickening, pericholecystic fluid, choledocholithiasis or common bile  duct dilatation.  Right upper quadrant ultrasound was done and did show small amount of ascites but no evidence of cholelithiasis or evidence of acute cholecystitis.  His LFTs have significantly worsened since admission and trended up and his AST was 6742 and his ALT was 1936 at his peak.  T bili also worsened to 2.4 and trended up to 3.6 but is now trending back down.  Lactic acid level was trending down slowly   Because his renal function worsened significantly we consulted nephrology for further evaluation, though fortunately with supportive care alone, CrCl has improved.  His LFTs also trended favorably with supportive care. He has remained clinically stable for discharge with plans for alcohol abstinence and outpatient follow up.  Discharge Diagnoses:  Principal Problem:   Transaminitis Active Problems:   Essential hypertension   High anion gap metabolic acidosis   Epigastric pain   Lactic acidosis   Hypokalemia   Hypocalcemia   Chronic alcohol abuse   HLD (hyperlipidemia)   Abnormal LFTs  Elevated LFTs: Likely related to ischemic hepatitis with underlying hepatic steatosis +/- alcoholic hepatitis with AST predominance. Concern for hemochromatosis with history of grossly elevated ferritin in 2020 and now. Hepatitis work up including acute panel, ceruloplasmin, EBV, HSV, CMV, anti-mitochondrial and anti-smooth muscle Ab's have been negative.  - Encouraging alcohol cessation. The patient is ready to make that change.  - Trend LFTs, continuing to improve. Follow up with GI post discharge. - F/u hemochromatosis DNA screen. Still pending on last check.   Acute renal failure: Non-oliguric, in fact having some subjective post-ATN diuresis. FENa was 4%. Contributions from hypotension, intravascular depletion, and contrast nephropathy  with CTA on admission.   - CrCl continues to show improvement with supportive measures only, supported by po intake alone, good UOP. Will recommend BMP at  discharge. If Cr settles in CKD range, consider nephrology follow up at [facility] (followed while hospitalized) - Though CrCl previously felt to be normal, U/S with increased echogenicity suggests some chronicity. No hydro.    Headache: Acute on chronic: Improving overall. - Continue tylenol at lower doses prn - Avoid NSAIDs. No hx seizure disorder, continue tramadol 50mg  q12h prn. After PDMP review, prescribed #10 tablets.   EtOH abuse:  - Continue cessation efforts post discharge.  - Monitor with withdrawal.    HTN: Initially hypotensive.  - Resumed norvasc 5mg  > increased to 10mg . Control improved. - Continue metoprolol - Avoiding spironolactone until rehceck of BMP at follow up.   BPH, History of prostate CA: s/p radioactive beads 2017.  - Monitor for LUTS   Lactic acidosis: Resolved.    Cystitis/pyelonephritis: WBC has normalized, pt remains afebrile on CTX x5 doses. No cultures sent initially. - Complete 14 days abx with keflex.    Hypokalemia: Improved.    Hyponatremia: Resolved   Thrombocytopenia: Likely related to liver disease as above.  - Showing improving trend, suggest recheck at follow up.  Discharge Instructions Discharge Instructions     Diet - low sodium heart healthy   Complete by: As directed    Discharge instructions   Complete by: As directed    You were evaluated for kidney failure which has improved and abnormal liver function tests which has also improved. You will need to follow up with GI after discharge, please call their office (information provided) if you don't hear from them to schedule a follow up appointment. A test for hemochromatosis is still pending at the time of discharge. You should abstain from alcohol.   - Your kidney function has not returned to normal yet, so do NOT take spironolactone until you follow up with your doctor. If your urine output decreases, seek medical attention right away.  - While holding the  spironolactone, you should take norvasc 10mg  (you were previously on 5mg ) to assist with high blood pressure management. - To treat the headache, you may take tylenol, but no greater than 3,000mg  total in a day. If the pain is severe, you may also take tramadol  for up to the next 5 days.  - Continue taking an antibiotic, keflex, twice a day for 9 more days to complete treatment for the urinary tract infection.   Increase activity slowly   Complete by: As directed       Allergies as of 10/31/2021       Reactions   Morphine And Related Rash        Medication List     STOP taking these medications    spironolactone 25 MG tablet Commonly known as: ALDACTONE       TAKE these medications    acetaminophen 325 MG tablet Commonly known as: TYLENOL Take 2 tablets (650 mg total) by mouth every 8 (eight) hours as needed for headache, moderate pain or mild pain.   albuterol 108 (90 Base) MCG/ACT inhaler Commonly known as: VENTOLIN HFA Inhale 2 puffs into the lungs every 6 (six) hours as needed for wheezing or shortness of breath.   amLODipine 10 MG tablet Commonly known as: NORVASC Take 1 tablet (10 mg total) by mouth daily. What changed:  medication strength how much to take   aspirin EC 81  MG tablet Take 1 tablet (81 mg total) by mouth in the morning and at bedtime. To prevent blood clots after surgery What changed:  when to take this additional instructions   atorvastatin 40 MG tablet Commonly known as: Lipitor Take 1 tablet (40 mg total) by mouth daily.   cephALEXin 500 MG capsule Commonly known as: KEFLEX Take 1 capsule (500 mg total) by mouth 2 (two) times daily for 9 days.   folic acid 1 MG tablet Commonly known as: FOLVITE Take 1 tablet (1 mg total) by mouth daily.   metoprolol succinate 50 MG 24 hr tablet Commonly known as: TOPROL-XL Take 50 mg by mouth daily.   thiamine 100 MG tablet Commonly known as: Vitamin B-1 Take 100 mg by mouth daily.    traMADol 50 MG tablet Commonly known as: ULTRAM Take 1 tablet (50 mg total) by mouth every 12 (twelve) hours as needed for up to 5 days for severe pain.   Vitamin B1 100 MG Tabs Take 100 mg by mouth in the morning.        Follow-up Information     Nolene Ebbs, MD .   Specialty: Internal Medicine Contact information: [address] [place] [address] [phone]         Otis Brace, MD Follow up.   Specialty: Gastroenterology Contact information: [place] [address] [phone]                Allergies  Allergen Reactions   Morphine And Related Rash    Consultations: Nephrology GI  Procedures/Studies: DG Chest 2 View  Result Date: 10/25/2021 CLINICAL DATA:  Chest pain EXAM: CHEST - 2 VIEW COMPARISON:  01/01/2021 FINDINGS: The heart size and mediastinal contours are within normal limits. Both lungs are clear. The visualized skeletal structures are unremarkable. IMPRESSION: No active cardiopulmonary disease. Electronically Signed   By: Inez Catalina M.D.   On: 10/25/2021 21:20   US RENAL  Result Date: 10/27/2021 CLINICAL DATA:  Acute kidney insufficiency EXAM: RENAL / URINARY TRACT ULTRASOUND COMPLETE COMPARISON:  Abdominal ultrasound 10/26/2021 FINDINGS: Right Kidney: Renal measurements: 12.8 x 6.1 x 6.8 cm = volume: 280 mL. Mildly increased echogenicity. No mass or hydronephrosis visualized. Left Kidney: Renal measurements: 12.5 x 5.6 x 5.9 cm = volume: 215 mL. Mildly increased echogenicity. No mass or hydronephrosis identified. Bladder: Incompletely distended with no focal mass identified. Other: None. IMPRESSION: Mildly increased echogenicity of the kidneys suggesting medical renal disease. No hydronephrosis. Electronically Signed   By: Ofilia Neas M.D.   On: 10/27/2021 10:13   CT Angio Chest/Abd/Pel for Dissection W and/or Wo Contrast  Result Date: 10/25/2021 CLINICAL DATA:  Chest pain with mid epigastric  pain. EXAM: CT ANGIOGRAPHY CHEST, ABDOMEN AND PELVIS TECHNIQUE: Non-contrast CT of the chest was initially obtained. Multidetector CT imaging through the chest, abdomen and pelvis was performed using the standard protocol during bolus administration of intravenous contrast. Multiplanar reconstructed images and MIPs were obtained and reviewed to evaluate the vascular anatomy. RADIATION DOSE REDUCTION: This exam was performed according to the departmental dose-optimization program which includes automated exposure control, adjustment of the mA and/or kV according to patient size and/or use of iterative reconstruction technique. CONTRAST:  72mL OMNIPAQUE IOHEXOL 350 MG/ML SOLN COMPARISON:  January 01, 2021 FINDINGS: CTA CHEST FINDINGS Cardiovascular: There is very mild calcification of the aortic arch, without evidence of aortic aneurysm or dissection. Satisfactory opacification of the pulmonary arteries to the segmental level. No evidence of pulmonary embolism.  Normal heart size with moderate to marked severity coronary artery calcification. No pericardial effusion. Mediastinum/Nodes: No enlarged mediastinal, hilar, or axillary lymph nodes. Thyroid gland, trachea, and esophagus demonstrate no significant findings. Lungs/Pleura: A small, stable, thin walled parenchymal cyst is seen within the posteromedial aspect of the right middle lobe. There is no evidence of acute infiltrate, pleural effusion or pneumothorax. Musculoskeletal: No chest wall abnormality. No acute or significant osseous findings. Review of the MIP images confirms the above findings. CTA ABDOMEN AND PELVIS FINDINGS VASCULAR Aorta: Normal caliber aorta without aneurysm, dissection, vasculitis or significant stenosis. Celiac: Patent without evidence of aneurysm, dissection, vasculitis or significant stenosis. SMA: Patent without evidence of aneurysm, dissection, vasculitis or significant stenosis. Renals: Both renal arteries are patent without evidence of  aneurysm, dissection, vasculitis, fibromuscular dysplasia or significant stenosis. IMA: Patent without evidence of aneurysm, dissection, vasculitis or significant stenosis. Inflow: Patent without evidence of aneurysm, dissection, vasculitis or significant stenosis. Veins: No obvious venous abnormality within the limitations of this arterial phase study. Review of the MIP images confirms the above findings. NON-VASCULAR Hepatobiliary: There is mild, diffuse fatty infiltration of the liver parenchyma. No focal liver abnormality is seen. No gallstones, gallbladder wall thickening, or biliary dilatation. Pancreas: Unremarkable. No pancreatic ductal dilatation or surrounding inflammatory changes. Spleen: Normal in size without focal abnormality. Adrenals/Urinary Tract: There is mild diffuse enlargement of the left adrenal gland. The right adrenal gland is unremarkable. Kidneys are normal in size, without obstructing renal calculi, focal lesion, or hydronephrosis. A 3 mm nonobstructing renal calculus is seen within the mid to lower left kidney. Mild, bilateral perinephric inflammatory fat stranding is noted. This represents a new finding. There is mild to moderate severity diffuse urinary bladder wall thickening with a mild amount of surrounding inflammatory fat stranding. Stomach/Bowel: Stomach is within normal limits. Appendix appears normal. No evidence of bowel wall thickening, distention, or inflammatory changes. Lymphatic: No abnormal abdominal or pelvic lymph nodes are identified. Reproductive: Multiple prostate radiation implantation seeds are seen. Other: No abdominal wall hernia or abnormality. No abdominopelvic ascites. Musculoskeletal: No acute or significant osseous findings. Review of the MIP images confirms the above findings. IMPRESSION: 1. No evidence of aortic aneurysm or dissection. 2. Moderate to marked severity coronary artery calcification. 3. Mild, diffuse fatty infiltration of the liver parenchyma.  4. Findings suggestive of mild to moderate severity cystitis and acute pyelonephritis. Correlation with urinalysis is recommended. 5. 3 mm nonobstructing left renal calculus. 6. Multiple prostate radiation implantation seeds. Aortic Atherosclerosis (ICD10-I70.0). Electronically Signed   By: Virgina Norfolk M.D.   On: 10/25/2021 21:11   US Abdomen Limited RUQ (LIVER/GB)  Result Date: 10/26/2021 CLINICAL DATA:  Elevated LFTs EXAM: ULTRASOUND ABDOMEN LIMITED RIGHT UPPER QUADRANT COMPARISON:  None. FINDINGS: Gallbladder: No gallstones or wall thickening visualized. No sonographic Murphy sign noted by sonographer. Common bile duct: Diameter: 4 mm Liver: No focal lesion identified. Within normal limits in parenchymal echogenicity. Portal vein is patent on color Doppler imaging with normal direction of blood flow towards the liver. Other: Small amount of ascites IMPRESSION: Small amount of ascites. No cholelithiasis or other evidence of acute cholecystitis. Electronically Signed   By: Ulyses Jarred M.D.   On: 10/26/2021 02:14     Subjective: Headache waxes and wanes, improved with tylenol and tramadol. No vision changes, neck stiffness or unilateral symptoms. Abdomen without tenderness, eating better. Urinating very well.   Discharge Exam: Vitals:   10/31/21 0450 10/31/21 0847  BP: 137/79 130/84  Pulse: 86 85  Resp:  18 20  Temp: 98.1 F (36.7 C) 98.5 F (36.9 C)  SpO2: 99% 96%   General: Pt is alert, awake, not in acute distress Cardiovascular: RRR, S1/S2 +, no rubs, no gallops Respiratory: CTA bilaterally, no wheezing, no rhonchi Abdominal: Soft, NT, ND, bowel sounds + Extremities: No edema, no cyanosis  Labs: BNP (last 3 results) Recent Labs    01/01/21 0424  BNP 71.2   Basic Metabolic Panel: Recent Labs  Lab 10/26/21 0215 10/26/21 0500 10/27/21 0401 10/27/21 1519 10/28/21 0304 10/29/21 0327 10/30/21 0214 10/31/21 0412  NA 136  --  133* 135 138 135 136 138  K 4.1  --  4.0  4.2 3.7 4.0 3.8 4.0  CL 102  --  106 104 104 102 103 101  CO2 20*  --  19* 19* 22 22 23 25   GLUCOSE 91  --  102* 102* 90 102* 99 105*  BUN 17  --  23 25* 23 23 27* 23  CREATININE 1.86*  --  3.94* 4.52* 4.95* 4.88* 4.04* 2.84*  CALCIUM 7.8*  --  7.9* 8.1* 8.3* 8.4* 8.6* 9.5  MG 1.8  --  1.7  --  1.7 1.5*  --   --   PHOS  --  3.0 2.6  --  3.1 2.6 2.7  --    Liver Function Tests: Recent Labs  Lab 10/27/21 1519 10/28/21 0304 10/29/21 0327 10/30/21 0214 10/31/21 0412  AST 1,833* 1,115* 352* 146* 74*  ALT 1,045* 828* 585* 406* 333*  ALKPHOS 176* 163* 137* 128* 127*  BILITOT 3.3* 1.9* 1.1 0.8 0.7  PROT 6.0* 5.8* 6.1* 6.4* 7.4  ALBUMIN 3.2* 3.0* 3.1* 3.3*   3.2* 3.8   Recent Labs  Lab 10/25/21 2136  LIPASE 30   No results for input(s): AMMONIA in the last 168 hours. CBC: Recent Labs  Lab 10/26/21 0215 10/27/21 0401 10/28/21 0304 10/29/21 0327 10/31/21 0412  WBC 6.6 12.0* 7.0 14.0* 8.1  NEUTROABS 5.4 10.4* 5.2 11.7*  --   HGB 12.1* 11.2* 11.5* 11.4* 12.8*  HCT 36.2* 35.0* 35.3* 35.3* 40.7  MCV 65.3* 65.4* 63.7* 63.6* 64.5*  PLT 156 127* 126* 119* 144*   Cardiac Enzymes: Recent Labs  Lab 10/26/21 0500  CKTOTAL 57   BNP: Invalid input(s): POCBNP CBG: No results for input(s): GLUCAP in the last 168 hours. D-Dimer No results for input(s): DDIMER in the last 72 hours. Hgb A1c No results for input(s): HGBA1C in the last 72 hours. Lipid Profile No results for input(s): CHOL, HDL, LDLCALC, TRIG, CHOLHDL, LDLDIRECT in the last 72 hours. Thyroid function studies No results for input(s): TSH, T4TOTAL, T3FREE, THYROIDAB in the last 72 hours.  Invalid input(s): FREET3 Anemia work up No results for input(s): VITAMINB12, FOLATE, FERRITIN, TIBC, IRON, RETICCTPCT in the last 72 hours. Urinalysis    Component Value Date/Time   COLORURINE YELLOW 10/26/2021 0030   APPEARANCEUR TURBID (A) 10/26/2021 0030   LABSPEC 1.025 10/26/2021 0030   PHURINE 5.5 10/26/2021 0030    GLUCOSEU NEGATIVE 10/26/2021 0030   HGBUR MODERATE (A) 10/26/2021 0030   BILIRUBINUR NEGATIVE 10/26/2021 0030   KETONESUR NEGATIVE 10/26/2021 0030   PROTEINUR >300 (A) 10/26/2021 0030   NITRITE NEGATIVE 10/26/2021 0030   LEUKOCYTESUR NEGATIVE 10/26/2021 0030    Microbiology Recent Results (from the past 240 hour(s))  Resp Panel by RT-PCR (Flu A&B, Covid) Nasopharyngeal Swab     Status: None   Collection Time: 10/26/21 12:55 AM   Specimen: Nasopharyngeal Swab; Nasopharyngeal(NP) swabs in vial  transport medium  Result Value Ref Range Status   SARS Coronavirus 2 by RT PCR NEGATIVE NEGATIVE Final    Comment: (NOTE) SARS-CoV-2 target nucleic acids are NOT DETECTED.  The SARS-CoV-2 RNA is generally detectable in upper respiratory specimens during the acute phase of infection. The lowest concentration of SARS-CoV-2 viral copies this assay can detect is 138 copies/mL. A negative result does not preclude SARS-Cov-2 infection and should not be used as the sole basis for treatment or other patient management decisions. A negative result may occur with  improper specimen collection/handling, submission of specimen other than nasopharyngeal swab, presence of viral mutation(s) within the areas targeted by this assay, and inadequate number of viral copies(<138 copies/mL). A negative result must be combined with clinical observations, patient history, and epidemiological information. The expected result is Negative.  Fact Sheet for Patients:  EntrepreneurPulse.com.au  Fact Sheet for Healthcare Providers:  IncredibleEmployment.be  This test is no t yet approved or cleared by the [place] FDA and  has been authorized for detection and/or diagnosis of SARS-CoV-2 by FDA under an Emergency Use Authorization (EUA). This EUA will remain  in effect (meaning this test can be used) for the duration of the COVID-19 declaration under Section 564(b)(1) of the Act,  21 U.S.C.section 360bbb-3(b)(1), unless the authorization is terminated  or revoked sooner.       Influenza A by PCR NEGATIVE NEGATIVE Final   Influenza B by PCR NEGATIVE NEGATIVE Final    Comment: (NOTE) The Xpert Xpress SARS-CoV-2/FLU/RSV plus assay is intended as an aid in the diagnosis of influenza from Nasopharyngeal swab specimens and should not be used as a sole basis for treatment. Nasal washings and aspirates are unacceptable for Xpert Xpress SARS-CoV-2/FLU/RSV testing.  Fact Sheet for Patients: EntrepreneurPulse.com.au  Fact Sheet for Healthcare Providers: IncredibleEmployment.be  This test is not yet approved or cleared by the [place] FDA and has been authorized for detection and/or diagnosis of SARS-CoV-2 by FDA under an Emergency Use Authorization (EUA). This EUA will remain in effect (meaning this test can be used) for the duration of the COVID-19 declaration under Section 564(b)(1) of the Act, 21 U.S.C. section 360bbb-3(b)(1), unless the authorization is terminated or revoked.  Performed at [facility], [place] [address], [place], [place] [address]     Time coordinating discharge: Approximately 40 minutes  Patrecia Pour, MD  [facility] 10/31/2021, 10:30 AM

## 2021-10-31 NOTE — Progress Notes (Signed)
[facility] Progress Note   Subjective:   feeling improved globally today.  Good appetite, good UOP - 1276mL yesterday, no LUTs.  D/C today  Objective Vitals:   10/30/21 0915 10/30/21 1757 10/31/21 0450 10/31/21 0847  BP: 131/88 139/88 137/79 130/84  Pulse: 76 78 86 85  Resp: 18 17 18 20   Temp: 98.2 F (36.8 C) 98 F (36.7 C) 98.1 F (36.7 C) 98.5 F (36.9 C)  TempSrc:   Oral Oral  SpO2: 95% 96% 99% 96%  Weight:      Height:       Physical Exam Gen: lying flat in bed looking  comfortable Eyes: EOMI ENT: MMM Neck: supple CV: RRR Lungs: clear laterally GU: no foley Extr:  no edema Neuro: awake, conversant Skin:no rashes noted  Additional Objective Labs: Basic Metabolic Panel: Recent Labs  Lab 10/28/21 0304 10/29/21 0327 10/30/21 0214 10/31/21 0412  NA 138 135 136 138  K 3.7 4.0 3.8 4.0  CL 104 102 103 101  CO2 22 22 23 25   GLUCOSE 90 102* 99 105*  BUN 23 23 27* 23  CREATININE 4.95* 4.88* 4.04* 2.84*  CALCIUM 8.3* 8.4* 8.6* 9.5  PHOS 3.1 2.6 2.7  --     Liver Function Tests: Recent Labs  Lab 10/29/21 0327 10/30/21 0214 10/31/21 0412  AST 352* 146* 74*  ALT 585* 406* 333*  ALKPHOS 137* 128* 127*  BILITOT 1.1 0.8 0.7  PROT 6.1* 6.4* 7.4  ALBUMIN 3.1* 3.3*   3.2* 3.8    Recent Labs  Lab 10/25/21 2136  LIPASE 30    CBC: Recent Labs  Lab 10/26/21 0215 10/27/21 0401 10/28/21 0304 10/29/21 0327 10/31/21 0412  WBC 6.6 12.0* 7.0 14.0* 8.1  NEUTROABS 5.4 10.4* 5.2 11.7*  --   HGB 12.1* 11.2* 11.5* 11.4* 12.8*  HCT 36.2* 35.0* 35.3* 35.3* 40.7  MCV 65.3* 65.4* 63.7* 63.6* 64.5*  PLT 156 127* 126* 119* 144*    Blood Culture    Component Value Date/Time   SDES URINE, CATHETERIZED 01/09/2019 0724   SPECREQUEST NONE 01/09/2019 0724   CULT  01/09/2019 0724    NO GROWTH Performed at [facility], [place] [address], [place], [place] [address]    REPTSTATUS 01/10/2019 FINAL 01/09/2019 0724    Cardiac Enzymes: Recent  Labs  Lab 10/26/21 0500  CKTOTAL 57    CBG: No results for input(s): GLUCAP in the last 168 hours. Iron Studies: No results for input(s): IRON, TIBC, TRANSFERRIN, FERRITIN in the last 72 hours. @lablastinr3 @ Studies/Results: No results found. Medications:  cefTRIAXone (ROCEPHIN)  IV 2 g (10/31/21 0909)    amLODipine  10 mg Oral Daily   feeding supplement  237 mL Oral BID BM   folic acid  1 mg Oral Daily   metoprolol succinate  50 mg Oral Daily   multivitamin with minerals  1 tablet Oral Daily   polyethylene glycol  17 g Oral BID   senna-docusate  1 tablet Oral BID   thiamine  100 mg Oral Daily    Assessment/Plan **AKI, severe:  Pt with normal baseline kidney function now with severe AKI in the setting of shock, contrast exposure.   No obstruction on imaging.  Has improved with supportive care alone.  Renal function with substantial improvement on labs in the past few day.  UOP has been great.  OK for D/C   **Hepatitis:  thought to be shock and EtOH induced --> GI following along.  W/u unrevealing except ?hemochromatosis,  gene being sent.  Improving.    **HTN: normotensive now on amlodipine.    **possible pyelonephritis:  s/p ceftriaxone with imaging findings and leukocytosis.    **EtOH overuse: s/p CIWA, vitamin repletion per primary.  Cessation counseling provided.   Ok to d/c from neprology perspective.  PCP can follow labs and if ends up with residual CKD after this AKI episode we're happy to see him in clinic.    Jannifer Hick MD 10/31/2021, 10:15 AM  [facility] Pager: [phone]

## 2021-10-31 NOTE — Progress Notes (Signed)
DISCHARGE NOTE HOME Bradley Ray to be discharged Home per MD order. Discussed prescriptions and follow up appointments with the patient. Prescriptions given to patient; medication list explained in detail. Patient verbalized understanding.  Skin clean, dry and intact without evidence of skin break down, no evidence of skin tears noted. IV catheter discontinued intact. Site without signs and symptoms of complications. Dressing and pressure applied. Pt denies pain at the site currently. No complaints noted.  Patient free of lines, drains, and wounds.   An After Visit Summary (AVS) was printed and given to the patient. Patient escorted via wheelchair, and discharged home via private auto.  [name] S Dearra Myhand, RN

## 2021-10-31 NOTE — TOC Transition Note (Signed)
Transition of Care [facility]) - CM/SW Discharge Note   Patient Details  Name: CAISEN MANGAS MRN: [id number] Date of Birth: May 23, 1956  Transition of Care [facility]) CM/SW Contact:  Tom-Johnson, Renea Ee, RN Phone Number: 10/31/2021, 12:55 PM   Clinical Narrative:    Patient is scheduled for discharge today. No recommendations noted. Patient denies any needs. Family to transport at discharge. No further TOC needs noted.   Final next level of care: Home/Self Care Barriers to Discharge: Barriers Resolved   Patient Goals and CMS Choice Patient states their goals for this hospitalization and ongoing recovery are:: To return home CMS Medicare.gov Compare Post Acute Care list provided to:: Patient Choice offered to / list presented to : NA  Discharge Placement                       Discharge Plan and Services                DME Arranged: N/A           HH Agency: NA        Social Determinants of Health (SDOH) Interventions     Readmission Risk Interventions No flowsheet data found.

## 2021-11-04 LAB — HEMOCHROMATOSIS DNA-PCR(C282Y,H63D)

## 2022-02-18 ENCOUNTER — Other Ambulatory Visit: Payer: Medicaid Other | Admitting: *Deleted

## 2022-02-18 DIAGNOSIS — R001 Bradycardia, unspecified: Secondary | ICD-10-CM

## 2022-02-18 DIAGNOSIS — I2510 Atherosclerotic heart disease of native coronary artery without angina pectoris: Secondary | ICD-10-CM

## 2022-02-18 DIAGNOSIS — I10 Essential (primary) hypertension: Secondary | ICD-10-CM

## 2022-02-18 DIAGNOSIS — E782 Mixed hyperlipidemia: Secondary | ICD-10-CM

## 2022-02-18 LAB — LIPID PANEL
Chol/HDL Ratio: 4 ratio (ref 0.0–5.0)
Cholesterol, Total: 140 mg/dL (ref 100–199)
HDL: 35 mg/dL — ABNORMAL LOW (ref >39)
LDL Chol Calc (NIH): 72 mg/dL (ref 0–99)
Triglycerides: 198 mg/dL — ABNORMAL HIGH (ref 0–149)
VLDL Cholesterol Cal: 33 mg/dL (ref 5–40)

## 2022-02-18 LAB — HEPATIC FUNCTION PANEL: Bilirubin, Direct: 0.16 mg/dL (ref 0.00–0.40)

## 2022-02-18 LAB — COMPREHENSIVE METABOLIC PANEL
ALT: 13 IU/L (ref 0–44)
AST: 13 IU/L (ref 0–40)
Albumin/Globulin Ratio: 1.8 (ref 1.2–2.2)
Albumin: 4.5 g/dL (ref 3.8–4.8)
Alkaline Phosphatase: 65 IU/L (ref 44–121)
BUN/Creatinine Ratio: 14 (ref 10–24)
BUN: 11 mg/dL (ref 8–27)
Bilirubin Total: 0.5 mg/dL (ref 0.0–1.2)
CO2: 23 mmol/L (ref 20–29)
Calcium: 9.4 mg/dL (ref 8.6–10.2)
Chloride: 104 mmol/L (ref 96–106)
Creatinine, Ser: 0.8 mg/dL (ref 0.76–1.27)
Globulin, Total: 2.5 g/dL (ref 1.5–4.5)
Glucose: 97 mg/dL (ref 70–99)
Potassium: 3.5 mmol/L (ref 3.5–5.2)
Sodium: 140 mmol/L (ref 134–144)
Total Protein: 7 g/dL (ref 6.0–8.5)
eGFR: 99 mL/min/{1.73_m2} (ref >59)

## 2022-03-27 IMAGING — CR DG CHEST 2V
2 series · 2 of 2 positions shown · non-contrast
Comparison: 01/01/2021

CLINICAL DATA: Chest pain

EXAM:
CHEST - 2 VIEW

[chest pa]
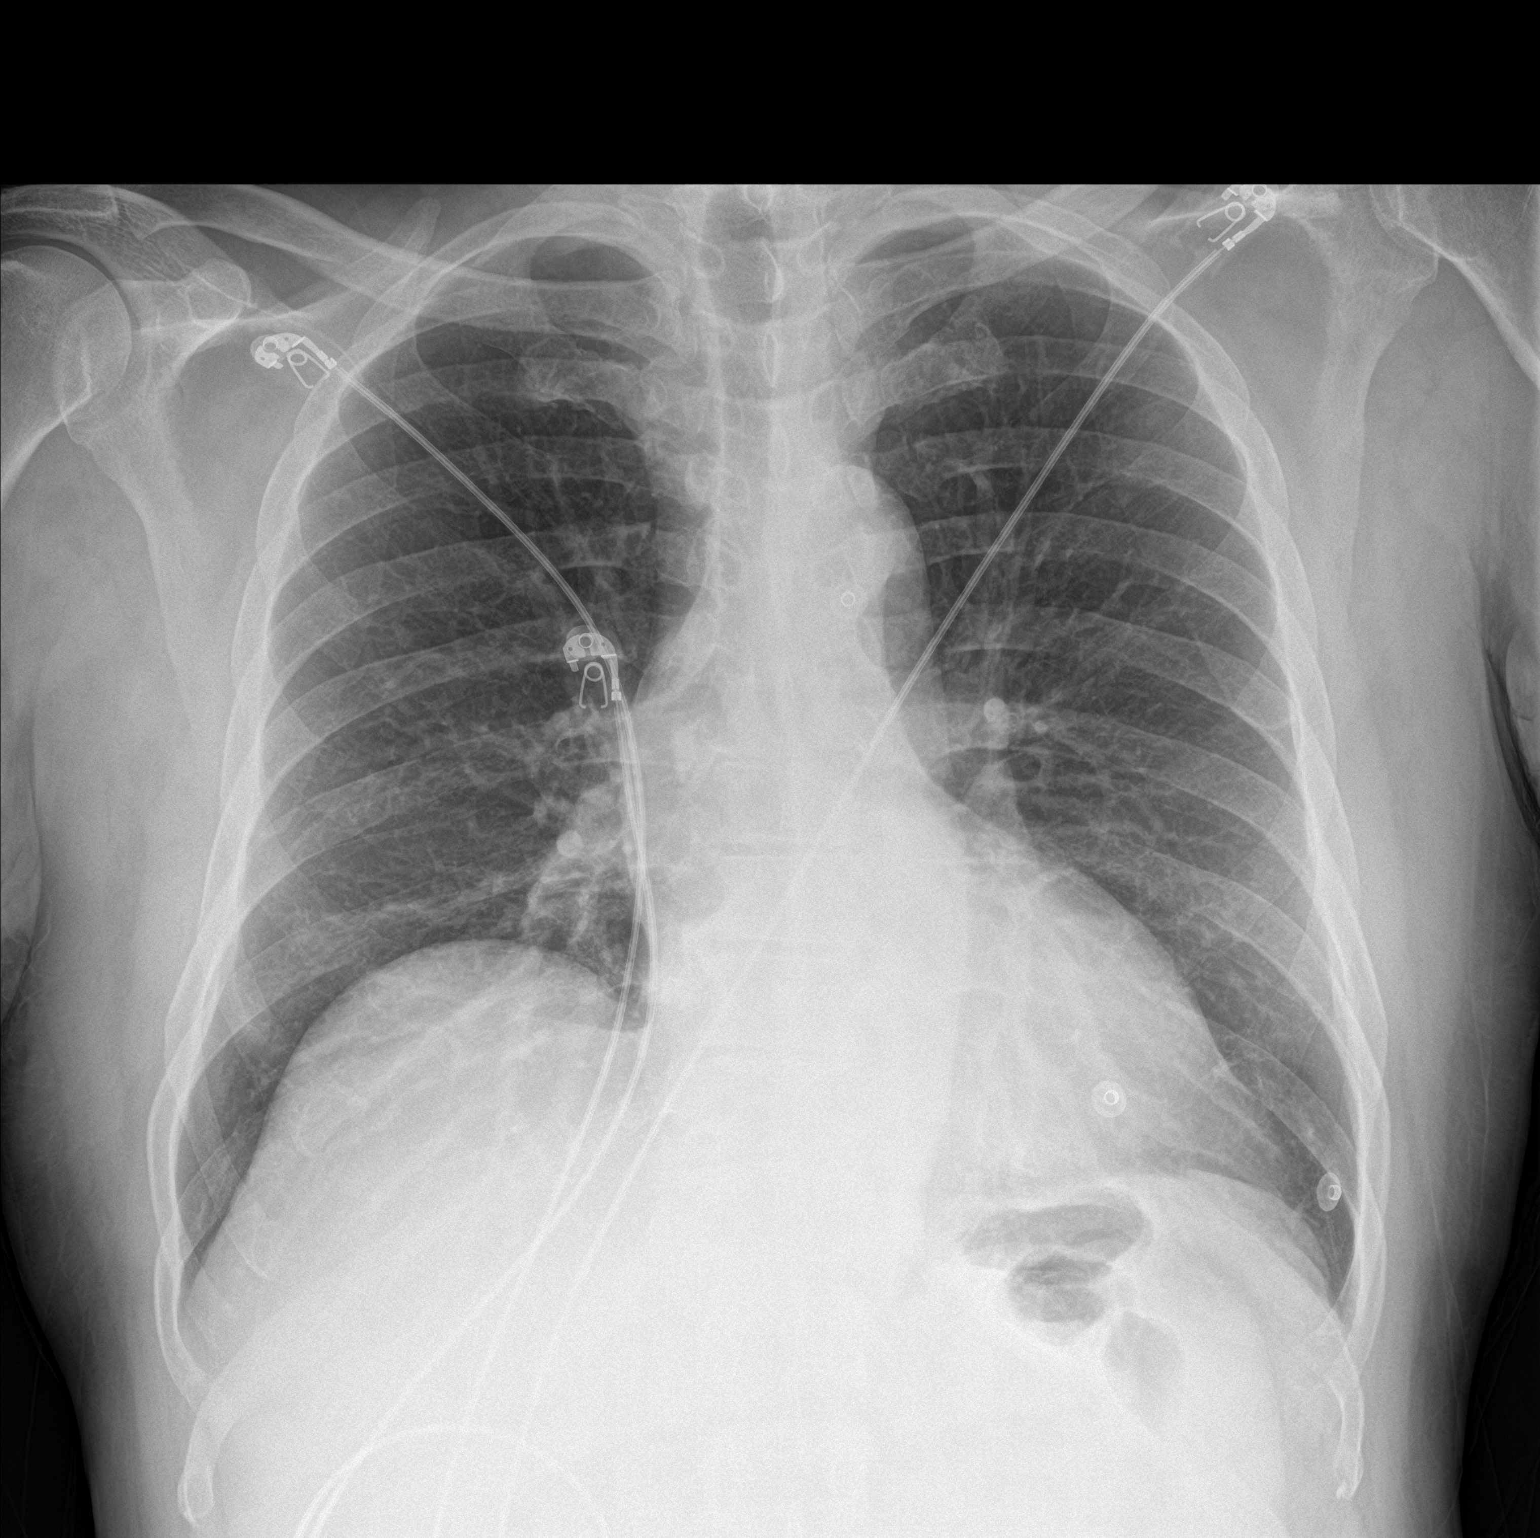

[chest lat]
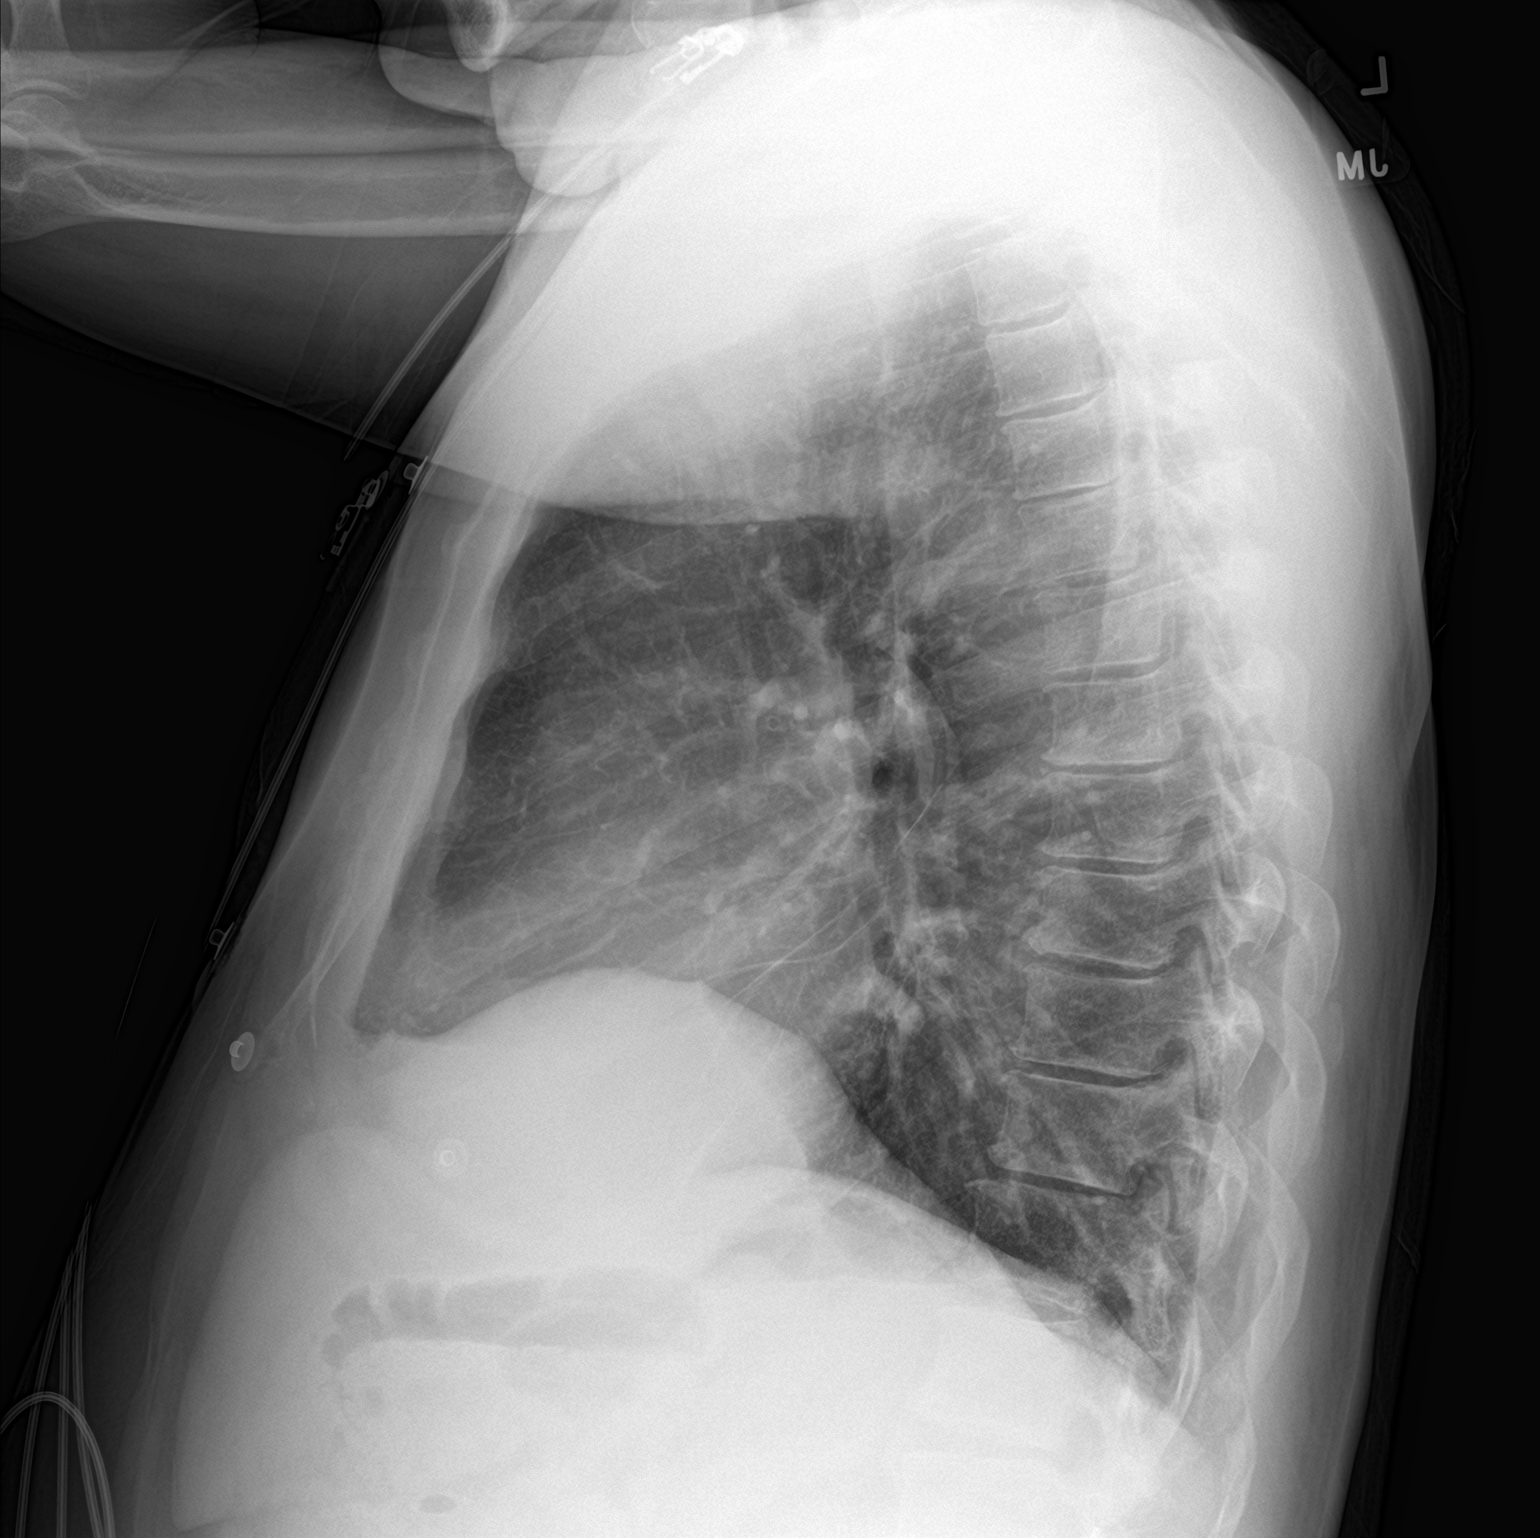

[2 of 2 positions shown; findings below may reference images not displayed]

FINDINGS: The heart size and mediastinal contours are within normal limits.
Both lungs are clear. The visualized skeletal structures are
unremarkable.
IMPRESSION: No active cardiopulmonary disease.

## 2022-03-27 IMAGING — CT CT ANGIO CHEST-ABD-PELV FOR DISSECTION W/ AND WO/W CM
2 of 7 series · 13 of 46 positions shown, 15 images · non-contrast
Comparison: January 01, 2021

CLINICAL DATA: Chest pain with mid epigastric pain.

EXAM:
CT ANGIOGRAPHY CHEST, ABDOMEN AND PELVIS
TECHNIQUE: Non-contrast CT of the chest was initially obtained.

[Series 7: dissection 2mm · axial · 0.87mm/px · z∈[+945,+1515]mm · 10 of 321 slices shown, 12 images]
[im 18/321  soft-tissue]
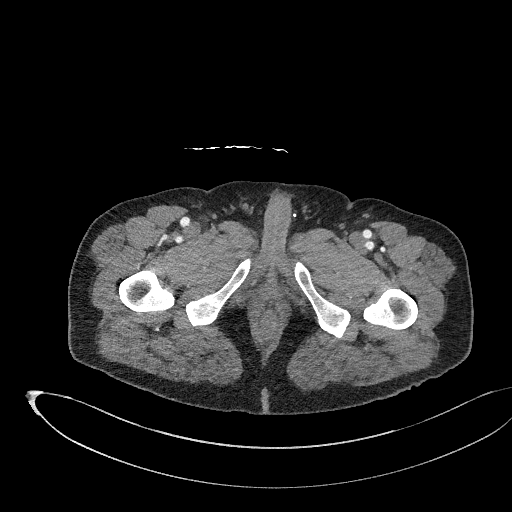
[im 18/321  bone]
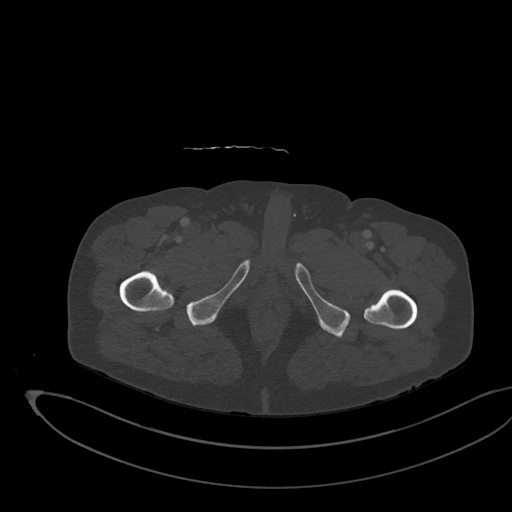
[im 54/321  soft-tissue]
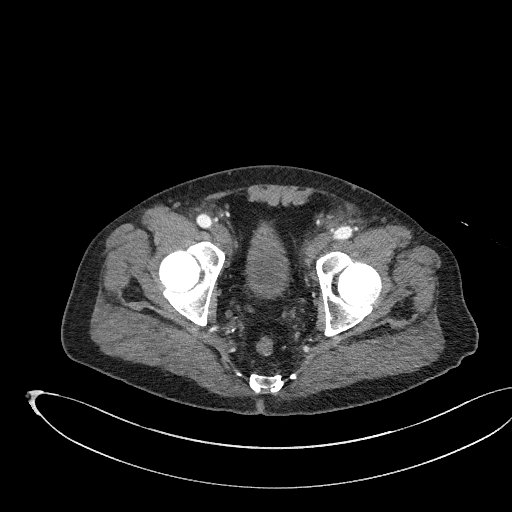
[im 89/321  soft-tissue]
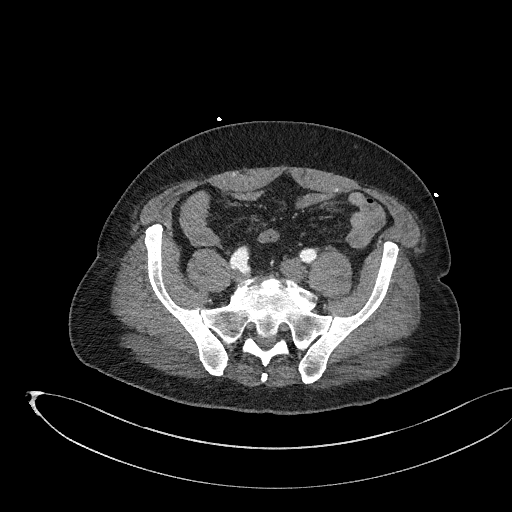
[im 107/321  soft-tissue]
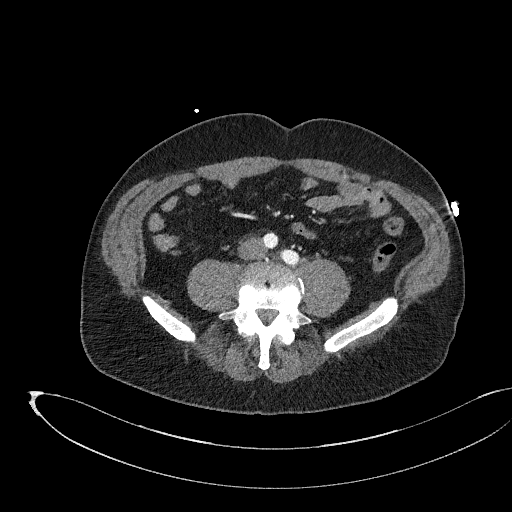
[im 143/321  soft-tissue]
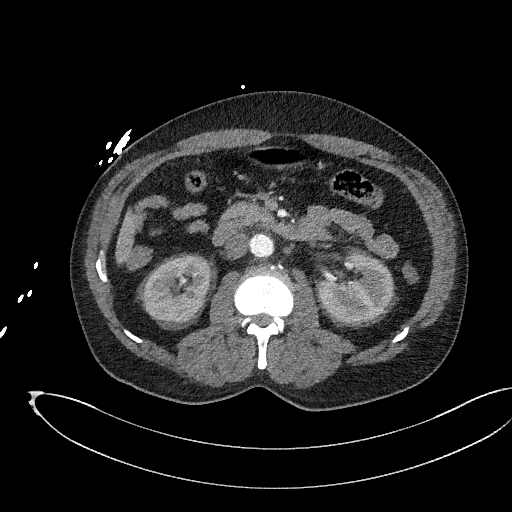
[im 178/321  soft-tissue]
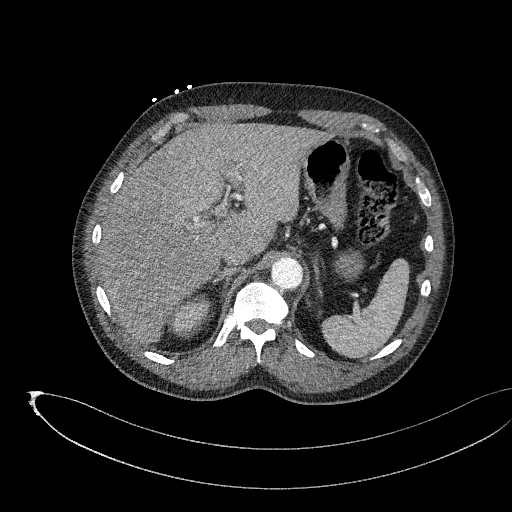
[im 214/321  soft-tissue]
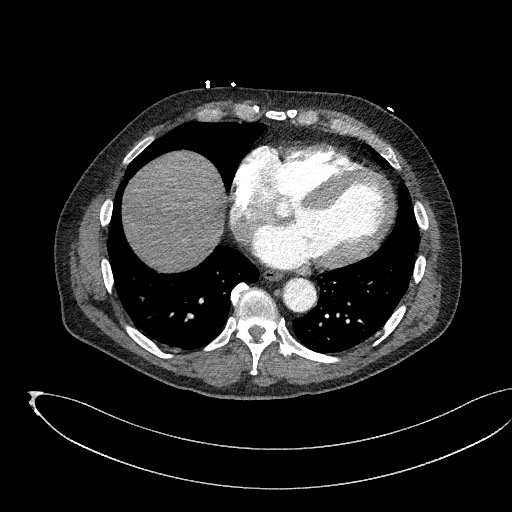
[im 232/321  soft-tissue]
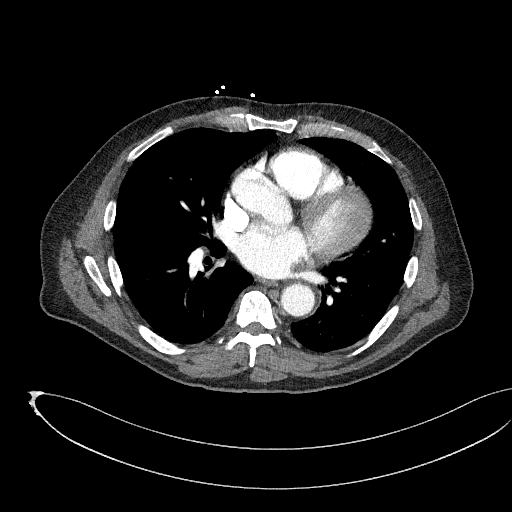
[im 267/321  soft-tissue]
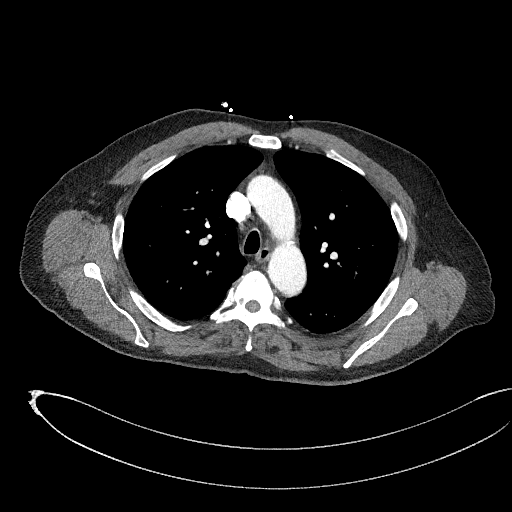
[im 267/321  bone]
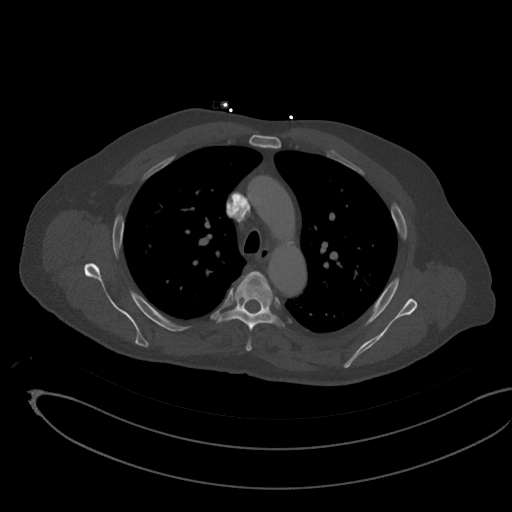
[im 303/321  soft-tissue]
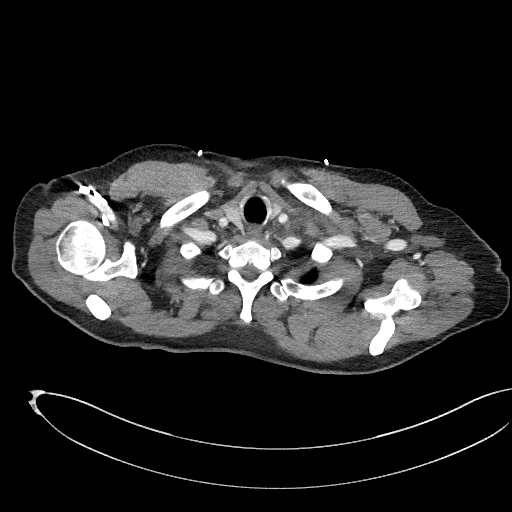

[Series 10: dissection 2mm cor · coronal · 0.82mm/px · 3 of 162 slices shown]
[im 41/162  soft-tissue]
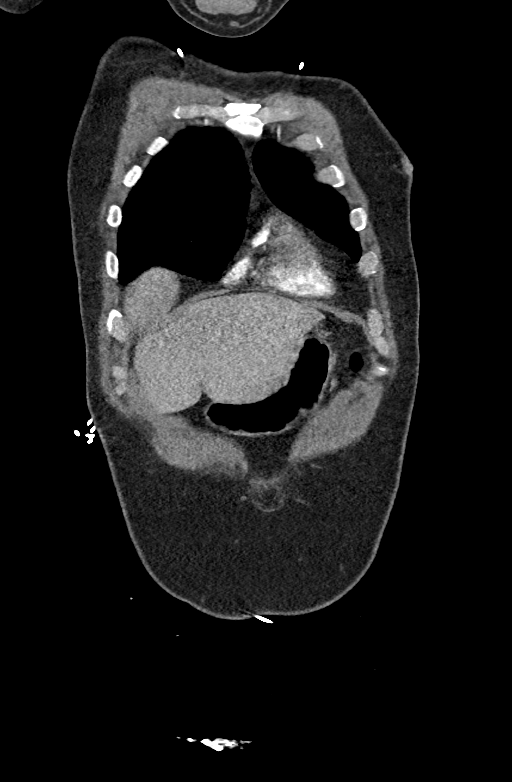
[im 81/162  soft-tissue]
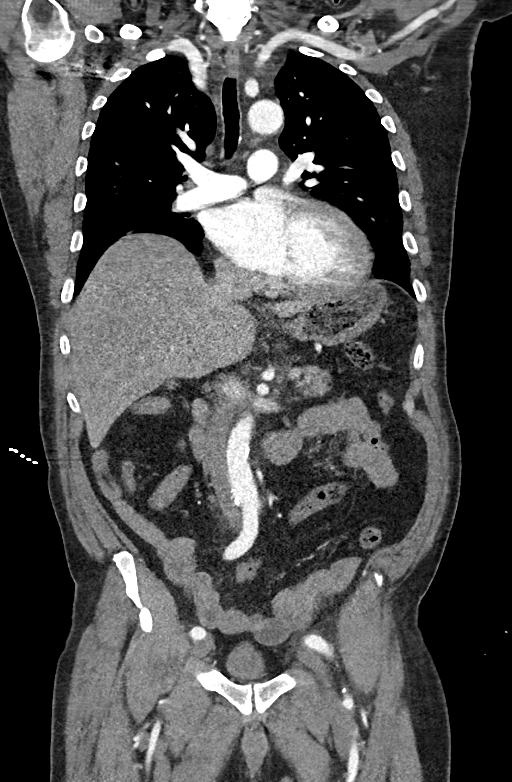
[im 121/162  soft-tissue]
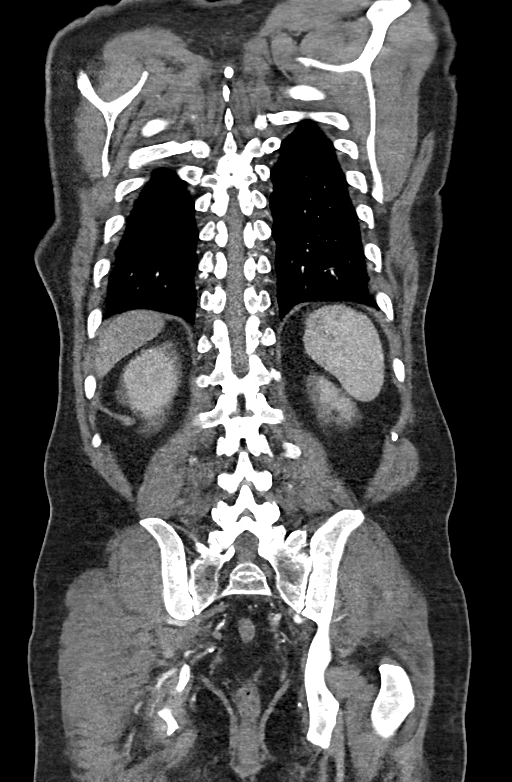

[13 of 46 positions shown; findings below may reference images not displayed]

Multidetector CT imaging through the chest, abdomen and pelvis was
performed using the standard protocol during bolus administration of
intravenous contrast. Multiplanar reconstructed images and MIPs were
obtained and reviewed to evaluate the vascular anatomy.

RADIATION DOSE REDUCTION: This exam was performed according to the
departmental dose-optimization program which includes automated
exposure control, adjustment of the mA and/or kV according to
patient size and/or use of iterative reconstruction technique.

CONTRAST:  80mL OMNIPAQUE IOHEXOL 350 MG/ML SOLN
FINDINGS: CTA CHEST FINDINGS

Cardiovascular: There is very mild calcification of the aortic arch,
without evidence of aortic aneurysm or dissection. Satisfactory
opacification of the pulmonary arteries to the segmental level. No
evidence of pulmonary embolism. Normal heart size with moderate to
marked severity coronary artery calcification. No pericardial
effusion.

Mediastinum/Nodes: No enlarged mediastinal, hilar, or axillary lymph
nodes. Thyroid gland, trachea, and esophagus demonstrate no
significant findings.

Lungs/Pleura: A small, stable, thin walled parenchymal cyst is seen
within the posteromedial aspect of the right middle lobe.

There is no evidence of acute infiltrate, pleural effusion or
pneumothorax.

Musculoskeletal: No chest wall abnormality. No acute or significant
osseous findings.

Review of the MIP images confirms the above findings.

CTA ABDOMEN AND PELVIS FINDINGS

VASCULAR

Aorta: Normal caliber aorta without aneurysm, dissection, vasculitis
or significant stenosis.

Celiac: Patent without evidence of aneurysm, dissection, vasculitis
or significant stenosis.

SMA: Patent without evidence of aneurysm, dissection, vasculitis or
significant stenosis.

Renals: Both renal arteries are patent without evidence of aneurysm,
dissection, vasculitis, fibromuscular dysplasia or significant
stenosis.

IMA: Patent without evidence of aneurysm, dissection, vasculitis or
significant stenosis.

Inflow: Patent without evidence of aneurysm, dissection, vasculitis
or significant stenosis.

Veins: No obvious venous abnormality within the limitations of this
arterial phase study.

Review of the MIP images confirms the above findings.

NON-VASCULAR

Hepatobiliary: There is mild, diffuse fatty infiltration of the
liver parenchyma. No focal liver abnormality is seen. No gallstones,
gallbladder wall thickening, or biliary dilatation.

Pancreas: Unremarkable. No pancreatic ductal dilatation or
surrounding inflammatory changes.

Spleen: Normal in size without focal abnormality.

Adrenals/Urinary Tract: There is mild diffuse enlargement of the
left adrenal gland. The right adrenal gland is unremarkable. Kidneys
are normal in size, without obstructing renal calculi, focal lesion,
or hydronephrosis. A 3 mm nonobstructing renal calculus is seen
within the mid to lower left kidney. Mild, bilateral perinephric
inflammatory fat stranding is noted. This represents a new finding.
There is mild to moderate severity diffuse urinary bladder wall
thickening with a mild amount of surrounding inflammatory fat
stranding.

Stomach/Bowel: Stomach is within normal limits. Appendix appears
normal. No evidence of bowel wall thickening, distention, or
inflammatory changes.

Lymphatic: No abnormal abdominal or pelvic lymph nodes are
identified.

Reproductive: Multiple prostate radiation implantation seeds are
seen.

Other: No abdominal wall hernia or abnormality. No abdominopelvic
ascites.

Musculoskeletal: No acute or significant osseous findings.

Review of the MIP images confirms the above findings.
IMPRESSION: 1. No evidence of aortic aneurysm or dissection.
2. Moderate to marked severity coronary artery calcification.
3. Mild, diffuse fatty infiltration of the liver parenchyma.
4. Findings suggestive of mild to moderate severity cystitis and
acute pyelonephritis. Correlation with urinalysis is recommended.
5. 3 mm nonobstructing left renal calculus.
6. Multiple prostate radiation implantation seeds.

Aortic Atherosclerosis (UCEX5-T94.4).

## 2022-03-28 IMAGING — US US ABDOMEN LIMITED
1 series · 14 of 25 positions shown · non-contrast
Comparison: None.

CLINICAL DATA: Elevated LFTs

EXAM:
ULTRASOUND ABDOMEN LIMITED RIGHT UPPER QUADRANT

[Series 1: us abdomen limited ruq (liver/gb) · 14 of 38 slices shown]
[im 1/38]
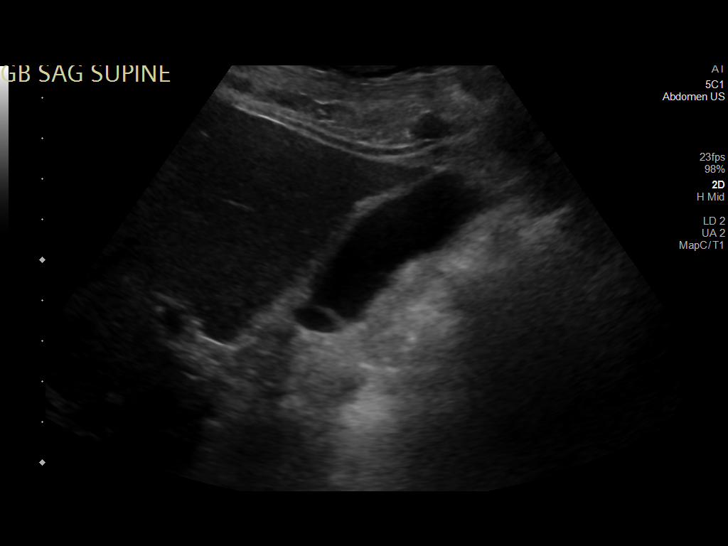
[im 4/38]
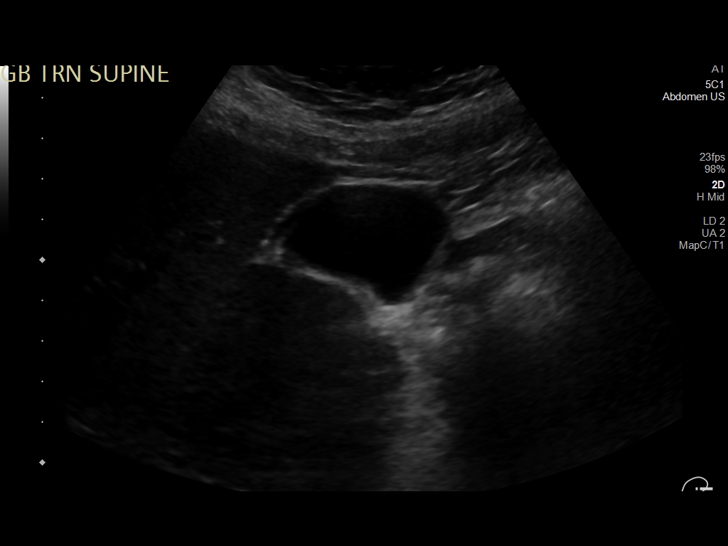
[im 7/38]
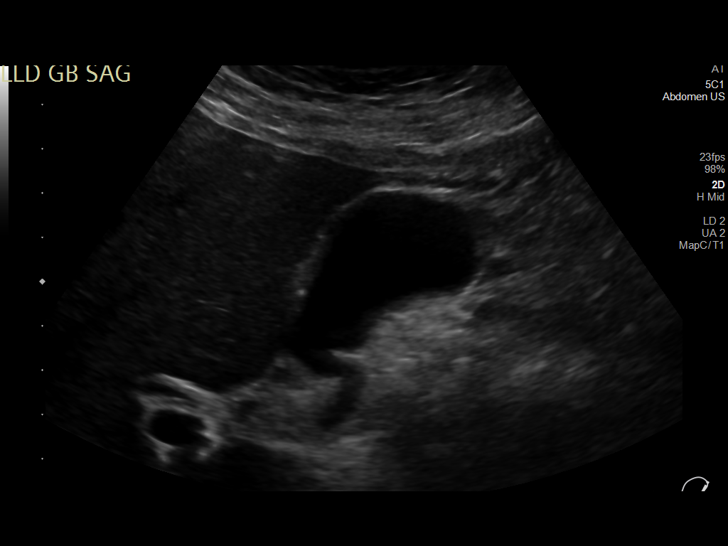
[im 10/38]
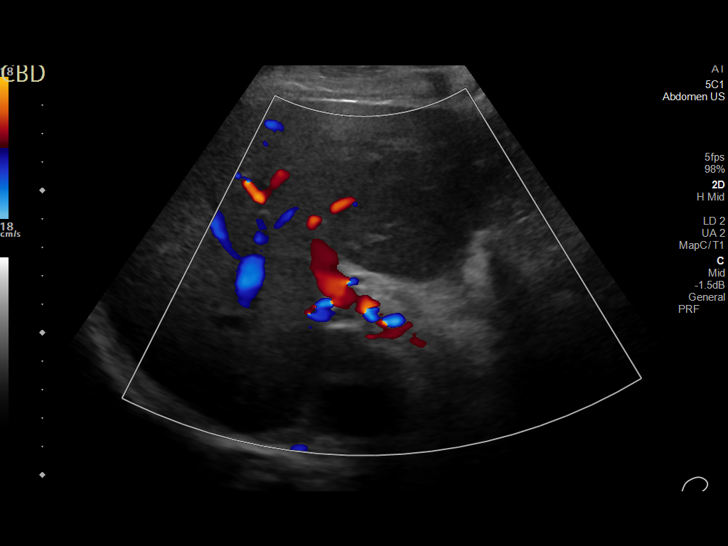
[im 13/38]
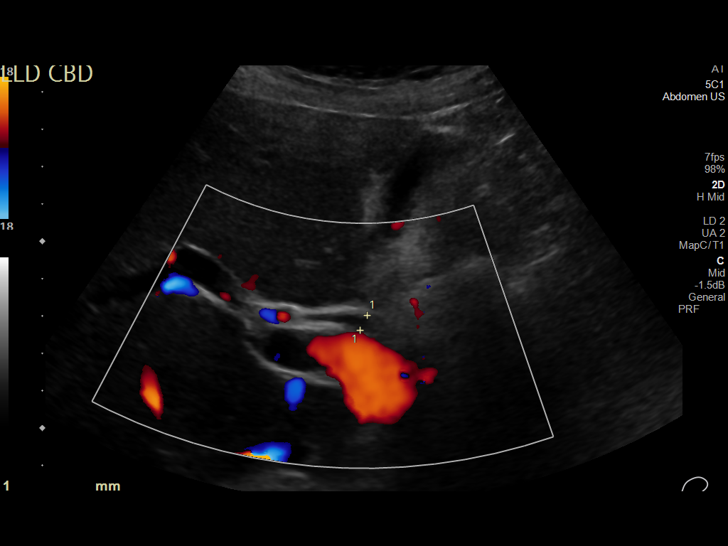
[im 14/38]
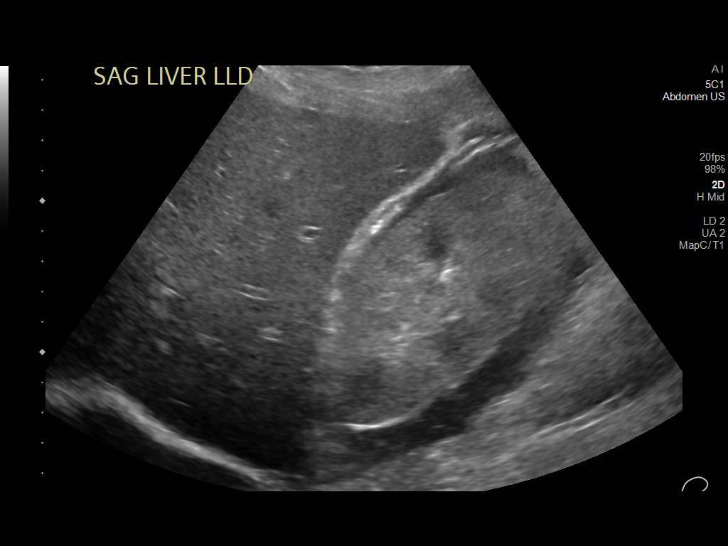
[im 17/38]
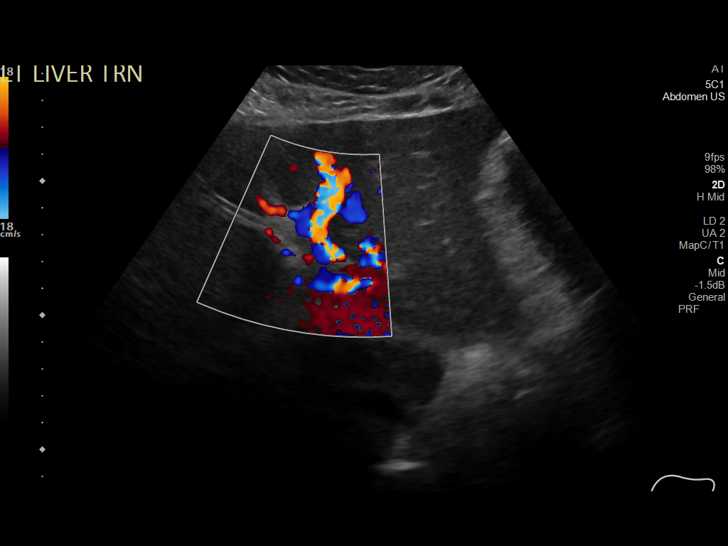
[im 21/38]
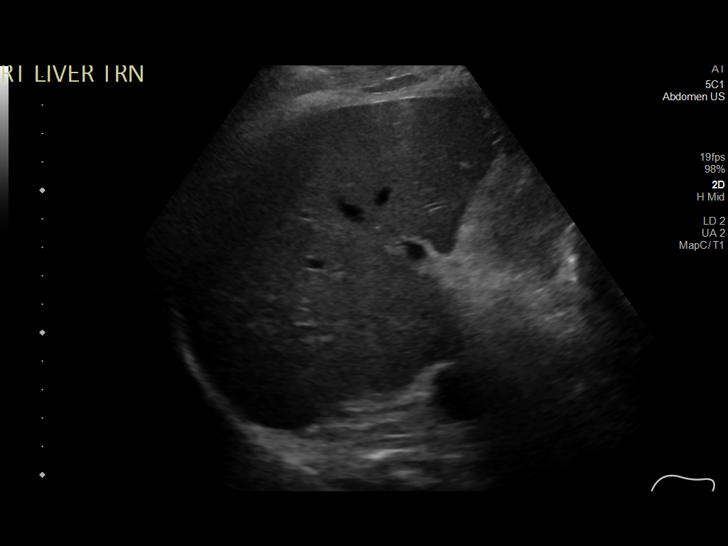
[im 24/38]
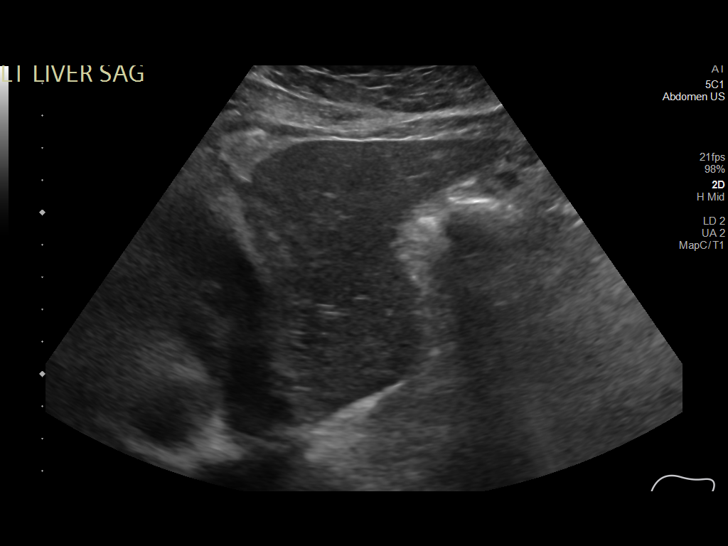
[im 25/38]
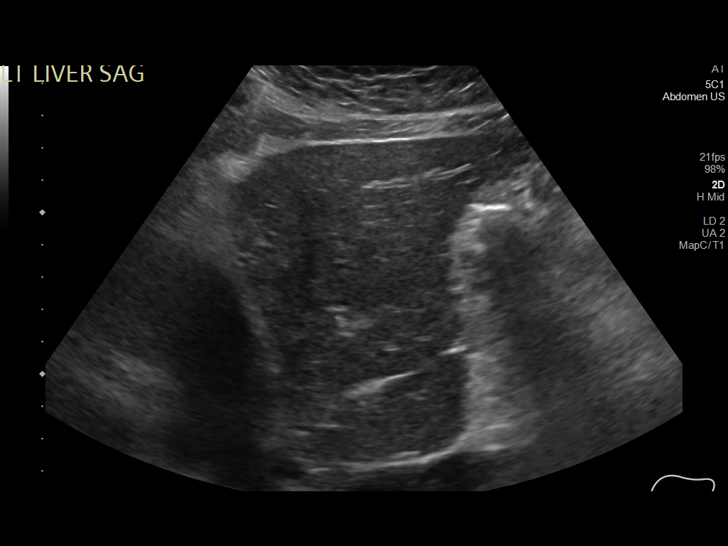
[im 28/38]
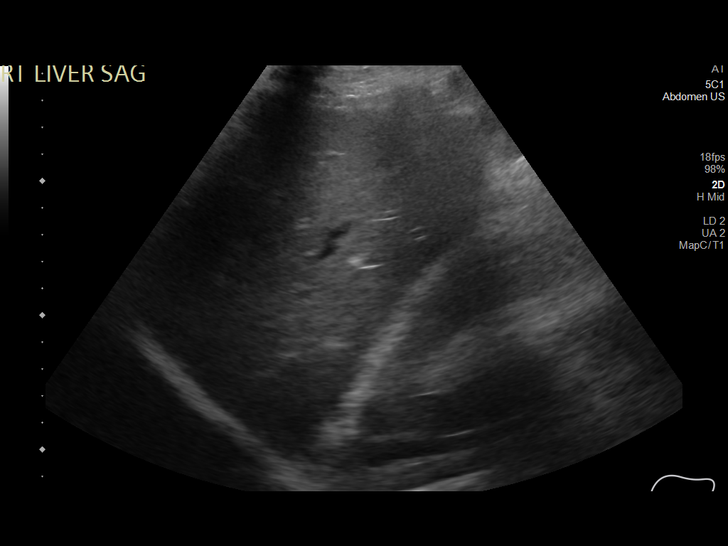
[im 31/38]
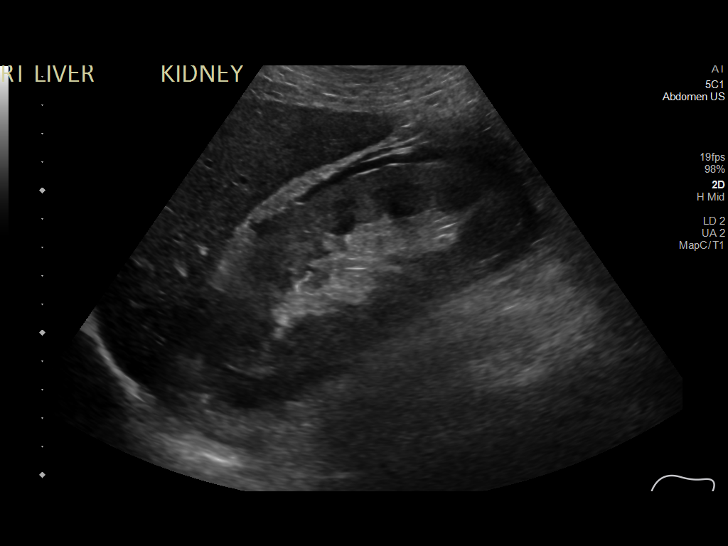
[im 34/38]
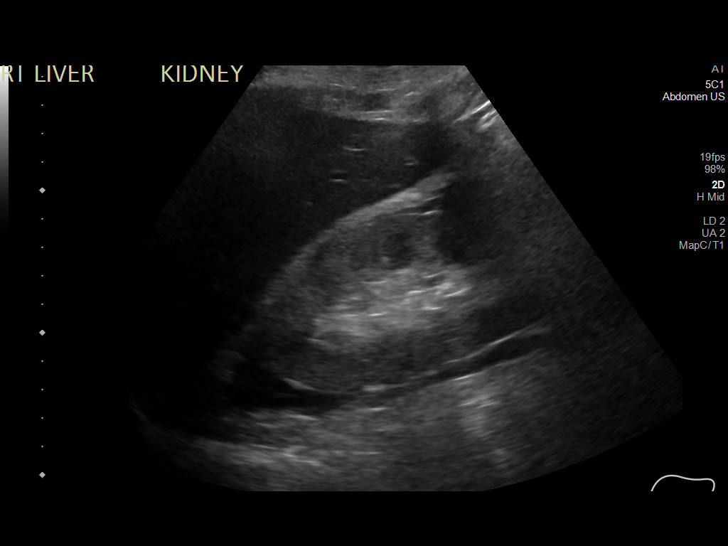
[im 38/38]
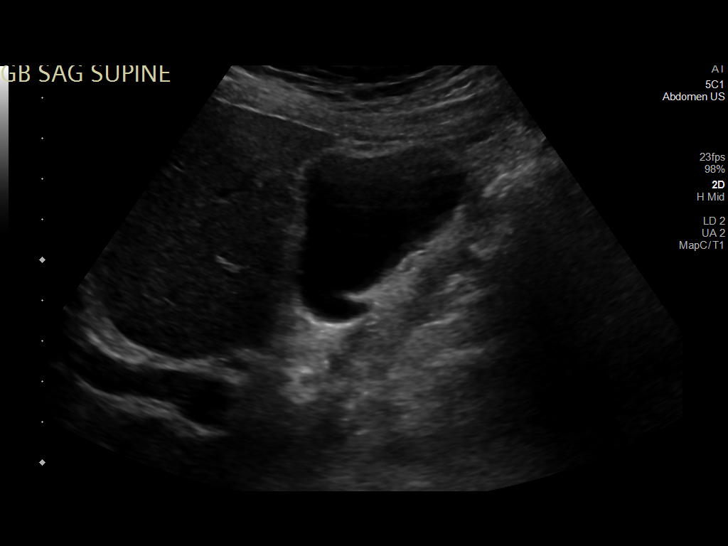

[14 of 25 positions shown; findings below may reference images not displayed]

FINDINGS: Gallbladder:

No gallstones or wall thickening visualized. No sonographic Murphy
sign noted by sonographer.

Common bile duct:

Diameter: 4 mm

Liver:

No focal lesion identified. Within normal limits in parenchymal
echogenicity. Portal vein is patent on color Doppler imaging with
normal direction of blood flow towards the liver.

Other: Small amount of ascites
IMPRESSION: Small amount of ascites. No cholelithiasis or other evidence of
acute cholecystitis.

## 2022-04-07 ENCOUNTER — Ambulatory Visit: Payer: Medicaid Other | Admitting: Cardiology

## 2022-04-07 ENCOUNTER — Encounter: Payer: Self-pay | Admitting: Cardiology

## 2022-04-07 DIAGNOSIS — I5032 Chronic diastolic (congestive) heart failure: Secondary | ICD-10-CM | POA: Diagnosis not present

## 2022-04-07 DIAGNOSIS — E7800 Pure hypercholesterolemia, unspecified: Secondary | ICD-10-CM

## 2022-04-07 NOTE — Assessment & Plan Note (Signed)
Doing well on atorvastatin 40 mg.  Last check LDL 72.  Wonderful.  Very close to goal of 70.  No myalgias.

## 2022-05-02 ENCOUNTER — Other Ambulatory Visit: Payer: Self-pay

## 2022-05-02 ENCOUNTER — Encounter (HOSPITAL_COMMUNITY): Payer: Self-pay | Admitting: Emergency Medicine

## 2022-05-02 ENCOUNTER — Emergency Department (HOSPITAL_COMMUNITY)
Admission: EM | Admit: 2022-05-02 | Discharge: 2022-05-03 | Disposition: A | Discharge status: Home / Self Care | Payer: Medicare Other | Attending: Emergency Medicine | Admitting: Emergency Medicine

## 2022-05-02 DIAGNOSIS — R519 Headache, unspecified: Secondary | ICD-10-CM | POA: Diagnosis present

## 2022-05-02 DIAGNOSIS — Z7982 Long term (current) use of aspirin: Secondary | ICD-10-CM | POA: Diagnosis not present

## 2022-05-02 DIAGNOSIS — Z87891 Personal history of nicotine dependence: Secondary | ICD-10-CM | POA: Insufficient documentation

## 2022-05-02 DIAGNOSIS — I10 Essential (primary) hypertension: Secondary | ICD-10-CM | POA: Diagnosis not present

## 2022-05-02 DIAGNOSIS — Z79899 Other long term (current) drug therapy: Secondary | ICD-10-CM | POA: Insufficient documentation

## 2022-05-02 LAB — CBC WITH DIFFERENTIAL/PLATELET
Abs Immature Granulocytes: 0.02 10*3/uL (ref 0.00–0.07)
Basophils Absolute: 0.1 10*3/uL (ref 0.0–0.1)
Basophils Relative: 1 %
Eosinophils Absolute: 0.4 10*3/uL (ref 0.0–0.5)
Eosinophils Relative: 5 %
HCT: 45.2 % (ref 39.0–52.0)
Hemoglobin: 14.2 g/dL (ref 13.0–17.0)
Immature Granulocytes: 0 %
Lymphocytes Relative: 54 %
Lymphs Abs: 4.3 10*3/uL — ABNORMAL HIGH (ref 0.7–4.0)
MCH: 19.8 pg — ABNORMAL LOW (ref 26.0–34.0)
MCHC: 31.4 g/dL (ref 30.0–36.0)
MCV: 63 fL — ABNORMAL LOW (ref 80.0–100.0)
Monocytes Absolute: 0.5 10*3/uL (ref 0.1–1.0)
Monocytes Relative: 6 %
Neutro Abs: 2.7 10*3/uL (ref 1.7–7.7)
Neutrophils Relative %: 34 %
Platelets: 250 10*3/uL (ref 150–400)
RBC: 7.18 MIL/uL — ABNORMAL HIGH (ref 4.22–5.81)
RDW: 18.9 % — ABNORMAL HIGH (ref 11.5–15.5)
WBC: 8 10*3/uL (ref 4.0–10.5)
nRBC: 0 % (ref 0.0–0.2)

## 2022-05-02 LAB — BASIC METABOLIC PANEL
Anion gap: 9 (ref 5–15)
BUN: 18 mg/dL (ref 8–23)
CO2: 27 mmol/L (ref 22–32)
Calcium: 9.2 mg/dL (ref 8.9–10.3)
Chloride: 105 mmol/L (ref 98–111)
Creatinine, Ser: 0.98 mg/dL (ref 0.61–1.24)
GFR, Estimated: >60 mL/min (ref >60)
Glucose, Bld: 132 mg/dL — ABNORMAL HIGH (ref 70–99)
Potassium: 4 mmol/L (ref 3.5–5.1)
Sodium: 141 mmol/L (ref 135–145)

## 2022-05-02 MED ORDER — ACETAMINOPHEN 325 MG PO TABS
650.0000 mg | ORAL_TABLET | Freq: Once | ORAL | Status: AC
  Administered 2022-05-02: 650 mg via ORAL
  Filled 2022-05-02: qty 2

## 2022-05-02 NOTE — ED Triage Notes (Signed)
Patient reports intermittent headaches for several weeks worse today , denies injury , alert and oriented . No emesis or photophobia .

## 2022-05-03 ENCOUNTER — Emergency Department (HOSPITAL_COMMUNITY): Payer: Medicare Other

## 2022-05-03 DIAGNOSIS — R519 Headache, unspecified: Secondary | ICD-10-CM | POA: Diagnosis not present

## 2022-05-03 MED ORDER — IBUPROFEN 600 MG PO TABS: 600.0000 mg | ORAL_TABLET | Freq: Four times a day (QID) | ORAL | 0 refills | Status: DC | PRN

## 2022-05-03 MED ORDER — DEXAMETHASONE SODIUM PHOSPHATE 10 MG/ML IJ SOLN
12.0000 mg | Freq: Once | INTRAMUSCULAR | Status: AC
  Administered 2022-05-03: 12 mg via INTRAVENOUS
  Filled 2022-05-03: qty 2

## 2022-05-03 MED ORDER — EPINEPHRINE PF 1 MG/ML IJ SOLN
INTRAMUSCULAR | Status: AC
  Filled 2022-05-03: qty 1

## 2022-05-03 MED ORDER — LIDOCAINE 5 % EX PTCH: 1.0000 | MEDICATED_PATCH | CUTANEOUS | 0 refills | Status: AC

## 2022-05-03 MED ORDER — LIDOCAINE 5 % EX PTCH
1.0000 | MEDICATED_PATCH | CUTANEOUS | Status: DC
  Administered 2022-05-03: 1 via TRANSDERMAL
  Filled 2022-05-03: qty 1

## 2022-05-03 MED ORDER — PROCHLORPERAZINE EDISYLATE 10 MG/2ML IJ SOLN
10.0000 mg | Freq: Once | INTRAMUSCULAR | Status: AC
  Administered 2022-05-03: 10 mg via INTRAVENOUS
  Filled 2022-05-03: qty 2

## 2022-05-03 MED ORDER — SODIUM CHLORIDE 0.9 % IV BOLUS
500.0000 mL | Freq: Once | INTRAVENOUS | Status: AC
  Administered 2022-05-03: 500 mL via INTRAVENOUS

## 2022-05-03 MED ORDER — KETOROLAC TROMETHAMINE 15 MG/ML IJ SOLN
15.0000 mg | Freq: Once | INTRAMUSCULAR | Status: AC
  Administered 2022-05-03: 15 mg via INTRAVENOUS
  Filled 2022-05-03: qty 1

## 2022-05-03 NOTE — ED Notes (Signed)
Delay in d/c d/t influx and critical needs in dept

## 2022-05-03 NOTE — ED Provider Notes (Addendum)
[facility] EMERGENCY DEPARTMENT Provider Note   CSN: [id number] Arrival date & time: 05/02/22  2129     History  Chief Complaint  Patient presents with  . Headache    Bradley Ray is a 66 y.o. male.   Headache  66 year old male presents emergency department with complaints of left-sided headache.  Patient states that symptoms again approximately 2 months ago with moderate pain in the left posterior aspect of patient's neck.  He denies known trauma or mechanism of injury.  He states the pain gradually increased over the course of the month before it nearly abated.  He states that approximately 1 month ago pain began in a similar fashion with gradual onset.  He was seen by his PCP approximately 2 weeks ago who gave him prednisone as well as a muscle relaxer which patient said minimally helped.  He presents today for worsening symptoms.  He denies any associated symptoms.  Denies change in vision, changes in gait, slurred speech, facial droop, weakness/sensory deficits.  He describes the pain as "hot drill."  He has taken Tylenol at home with minimal relief.  He denies any relieving symptoms.  He states that movement of his neck in an extension fashion worsens pain.  Denies fever, chest pain, shortness of breath, abdominal pain, nausea/vomiting/diarrhea, urinary symptoms, change in bowel habits, fractured tooth/dental pain.  Past medical history significant for hyperlipidemia, hypertension, hepatitis, arthritis  Home Medications Prior to Admission medications   Medication Sig Start Date End Date Taking? Authorizing Provider  ibuprofen (ADVIL) 600 MG tablet Take 1 tablet (600 mg total) by mouth every 6 (six) hours as needed. 05/03/22  Yes Dion Saucier A, PA  lidocaine (LIDODERM) 5 % Place 1 patch onto the skin daily. Remove & Discard patch within 12 hours or as directed by MD 05/03/22  Yes Wilnette Kales, PA  acetaminophen (TYLENOL) 325 MG tablet Take 2 tablets (650 mg  total) by mouth every 8 (eight) hours as needed for headache, moderate pain or mild pain. 10/31/21   Patrecia Pour, MD  albuterol (PROVENTIL HFA;VENTOLIN HFA) 108 (90 Base) MCG/ACT inhaler Inhale 2 puffs into the lungs every 6 (six) hours as needed for wheezing or shortness of breath.    [provider]  amLODipine (NORVASC) 10 MG tablet Take 1 tablet (10 mg total) by mouth daily. 10/31/21   Patrecia Pour, MD  aspirin EC 81 MG tablet Take 1 tablet (81 mg total) by mouth in the morning and at bedtime. To prevent blood clots after surgery Patient taking differently: Take 81 mg by mouth daily. 12/31/20   Britt Bottom, PA-C  atorvastatin (LIPITOR) 40 MG tablet Take 1 tablet (40 mg total) by mouth daily. 11/28/15   Kelvin Cellar, MD  folic acid (FOLVITE) 1 MG tablet Take 1 tablet (1 mg total) by mouth daily. 01/19/19   Antonieta Pert, MD  metoprolol succinate (TOPROL-XL) 50 MG 24 hr tablet Take 50 mg by mouth daily. 01/28/21   [provider]  thiamine (VITAMIN B-1) 100 MG tablet Take 100 mg by mouth daily.    [provider]  colchicine 0.6 MG tablet Take 0.5 tablets (0.3 mg total) by mouth 2 (two) times a week for 8 doses. Patient not taking: Reported on 09/27/2019 01/19/19 09/27/19  Antonieta Pert, MD      Allergies    Morphine and related    Review of Systems   Review of Systems  Neurological:  Positive for headaches.  All  other systems reviewed and are negative.   Physical Exam Updated Vital Signs BP (!) 128/91 (BP Location: Right Arm)   Pulse (!) 59   Temp 97.7 F (36.5 C)   Resp 16   SpO2 95%  Physical Exam Vitals and nursing note reviewed.  Constitutional:      General: He is not in acute distress.    Appearance: He is well-developed.  HENT:     Head: Normocephalic and atraumatic.  Eyes:     General: No scleral icterus.    Extraocular Movements: Extraocular movements intact.     Right eye: Normal extraocular motion and no nystagmus.     Left eye: Normal  extraocular motion and no nystagmus.     Conjunctiva/sclera: Conjunctivae normal.     Pupils: Pupils are equal, round, and reactive to light. Pupils are equal.  Neck:     Meningeal: Brudzinski's sign and Kernig's sign absent.     Comments: Patient has tenderness to palpation left paraspinal muscles at the base of the skull.  Pain is exacerbated with extension of neck.  Patient has full active range of motion of neck.  No overlying skin abnormalities noted.  No abscess, area of induration, rash, lesion appreciated on exam.  No midline tenderness noted. Cardiovascular:     Rate and Rhythm: Normal rate and regular rhythm.     Heart sounds: No murmur heard. Pulmonary:     Effort: Pulmonary effort is normal. No respiratory distress.     Breath sounds: Normal breath sounds.  Abdominal:     Palpations: Abdomen is soft.     Tenderness: There is no abdominal tenderness.  Musculoskeletal:        General: No swelling.     Cervical back: No rigidity.  Skin:    General: Skin is warm and dry.     Capillary Refill: Capillary refill takes less than 2 seconds.  Neurological:     Mental Status: He is alert.     Cranial Nerves: No dysarthria or facial asymmetry.     Sensory: Sensation is intact.     Motor: Motor function is intact. No pronator drift.     Coordination: Finger-Nose-Finger Test and Heel to Tool Test normal.     Comments: Cranial nerves III through XII grossly intact.  Muscle strength 5 out of 5 upper lower extremities.  No sensory deficits along major nerve distributions of upper or lower extremities.  Radial and posterior tibial pulses equal full bilaterally.  Psychiatric:        Mood and Affect: Mood normal.     ED Results / Procedures / Treatments   Labs (all labs ordered are listed, but only abnormal results are displayed) Labs Reviewed  CBC WITH DIFFERENTIAL/PLATELET - Abnormal; Notable for the following components:      Result Value   RBC 7.18 (*)    MCV 63.0 (*)    MCH 19.8  (*)    RDW 18.9 (*)    Lymphs Abs 4.3 (*)    All other components within normal limits  BASIC METABOLIC PANEL - Abnormal; Notable for the following components:   Glucose, Bld 132 (*)    All other components within normal limits    EKG None  Radiology CT Head Wo Contrast  Result Date: 05/03/2022 CLINICAL DATA:  Intermittent headaches for several weeks with worsening today. EXAM: CT HEAD WITHOUT CONTRAST TECHNIQUE: Contiguous axial images were obtained from the base of the skull through the vertex without intravenous contrast. RADIATION DOSE REDUCTION:  This exam was performed according to the departmental dose-optimization program which includes automated exposure control, adjustment of the mA and/or kV according to patient size and/or use of iterative reconstruction technique. COMPARISON:  No comparison studies available. FINDINGS: Brain: There is no evidence for acute hemorrhage, hydrocephalus, mass lesion, or abnormal extra-axial fluid collection. No definite CT evidence for acute infarction. Vascular: No hyperdense vessel or unexpected calcification. Skull: No evidence for fracture. No worrisome lytic or sclerotic lesion. Sinuses/Orbits: The visualized paranasal sinuses and mastoid air cells are clear. Visualized portions of the globes and intraorbital fat are unremarkable. Other: None. IMPRESSION: No acute intracranial abnormality. Electronically Signed   By: Misty Stanley M.D.   On: 05/03/2022 09:17    Procedures Procedures    Medications Ordered in ED Medications  lidocaine (LIDODERM) 5 % 1 patch (1 patch Transdermal Patch Applied 05/03/22 0948)  acetaminophen (TYLENOL) tablet 650 mg (650 mg Oral Given 05/02/22 2139)  sodium chloride 0.9 % bolus 500 mL (500 mLs Intravenous New Bag/Given 05/03/22 0946)  prochlorperazine (COMPAZINE) injection 10 mg (10 mg Intravenous Given 05/03/22 0947)  dexamethasone (DECADRON) injection 12 mg (12 mg Intravenous Given 05/03/22 0947)  ketorolac (TORADOL)  15 MG/ML injection 15 mg (15 mg Intravenous Given 05/03/22 0947)    ED Course/ Medical Decision Making/ A&P Clinical Course as of 05/03/22 1032  Sun May 03, 2022  1031 Reevaluation of the patient after migraine cocktail and small fluid bolus showed significant improvement the patient and patient's headache. [CR]    Clinical Course User Index [CR] Wilnette Kales, PA                           Medical Decision Making Amount and/or Complexity of Data Reviewed Labs: ordered. Radiology: ordered.  Risk OTC drugs. Prescription drug management.   This patient presents to the ED for concern of headache, this involves an extensive number of treatment options, and is a complaint that carries with it a high risk of complications and morbidity.  The differential diagnosis includes Emergent considerations for headache include subarachnoid hemorrhage, meningitis, temporal arteritis, glaucoma, cerebral ischemia, carotid/vertebral dissection, intracranial tumor, Venous sinus thrombosis, carbon monoxide poisoning, acute or chronic subdural hemorrhage.  Other considerations include: Migraine, Cluster headache, Hypertension, Caffeine, alcohol, or drug withdrawal, Pseudotumor cerebri, Arteriovenous malformation, Head injury, Neurocysticercosis, Tension headache, Sinusitis, Cervical arthritis, Refractive error causing strain, Dental abscess, Otitis media, Temporomandibular joint syndrome, Depression, Somatoform disorder (eg, somatization) Trigeminal neuralgia, Glossopharyngeal neuralgia.  Co morbidities that complicate the patient evaluation  See HPI   Additional history obtained:  Additional history obtained from EMR External records from outside source obtained and reviewed including prior to 524 of 10/31/2021   Lab Tests:  I Ordered, and personally interpreted labs.  The pertinent results include: No acute abnormality.   Imaging Studies ordered:  I ordered imaging studies including CT head I  independently visualized and interpreted imaging which showed no acute intracranial abnormality I agree with the radiologist interpretation   Cardiac Monitoring: / EKG:  The patient was maintained on a cardiac monitor.  I personally viewed and interpreted the cardiac monitored which showed an underlying rhythm of: Sinus rhythm   Consultations Obtained:  N/a   Problem List / ED Course / Critical interventions / Medication management  Headache I ordered medication including Decadron, Toradol, Compazine and normal saline for migraine cocktail.  Lidoderm for topical pain relief.    Reevaluation of the patient after these medicines showed that the patient  improved I have reviewed the patients home medicines and have made adjustments as needed   Social Determinants of Health:  Former cigarette smoker ceased 2006.  Denies illicit drug use.   Test / Admission - Considered:  Headache Vitals signs significant within normal range and stable throughout visit. Laboratory/imaging studies significant for: No acute abnormality Given no focal findings on neurologic exam as well as reproducible pain on physical exam and negative CT findings, patient symptoms most likely secondary to headache of origin in the left paraspinal cervical spine musculature.  Patient treated in the emergency department with fluids and migraine cocktail of which she responded well to.  His symptoms could be secondary to rebound headache given his consumption of regular Tylenol over the past 2 months to treat headaches.  We will try ibuprofen as needed at home for headache as well as Lidoderm patches.  He will be given stretching exercises for his cervical spine musculature as well.  Patient was educated that ibuprofen as well can also be attributed to rebound type headaches.  Doubt intracranial hemorrhage, temporal arteritis, meningitis, dental abscess, trigeminal neuralgia.  Patient was educated regarding treatment plan going  forward and expressed understanding and was agreeable to said plan.  Follow-up with PCP recommended within the next 3 to 5 days for reevaluation of patient's symptoms. Worrisome signs and symptoms were discussed with the patient, and the patient acknowledged understanding to return to the ED if noticed. Patient was stable upon discharge.          Final Clinical Impression(s) / ED Diagnoses Final diagnoses:  Chronic nonintractable headache, unspecified headache type    Rx / DC Orders ED Discharge Orders          Ordered    lidocaine (LIDODERM) 5 %  Every 24 hours        05/03/22 1030    ibuprofen (ADVIL) 600 MG tablet  Every 6 hours PRN        05/03/22 1030              Wilnette Kales, [place] 05/03/22 1030    Wilnette Kales, [place] 05/03/22 1030    Wilnette Kales, [place] 05/03/22 1032    Godfrey Pick, MD 05/03/22 1205

## 2022-05-03 NOTE — Discharge Instructions (Addendum)
We will prescribe ibuprofen to take as needed for headache as well as the Lidoderm patches you have given while in the emergency department.  Note that there is a condition called rebound headache.  This is a condition in which too much headache medication actually causes a continuation or worsening of a known headache.  It may be worth worthwhile to stop taking headache medication for 3 to 5 days and see if that helps.  Even the medical condition I prescribed ibuprofen can be a cause of rebound headache as well.  Lidoderm patches do not cause this phenomena.  Your CT scan of your head was overall negative for any abnormality.  Please follow-up with your primary care physician sometime this next week for reevaluation of your symptoms as well as update on your emergency department visit today.  Please not hesitate to return to the emergency department if the worrisome signs and symptoms we discussed become apparent.

## 2022-05-03 NOTE — ED Notes (Signed)
Pt not in area, pt in CT.

## 2022-05-03 NOTE — ED Notes (Signed)
Pt ambulatory to b/r with steady gait. C/o HA, denies NVD, fever, URI sx.

## 2022-05-03 NOTE — ED Notes (Signed)
Pt alert, NAD, calm, interactive, resps e/u, speech clear.

## 2022-08-29 ENCOUNTER — Encounter: Payer: Self-pay | Admitting: *Deleted

## 2022-09-15 ENCOUNTER — Encounter: Payer: Self-pay | Admitting: *Deleted

## 2022-09-18 NOTE — Progress Notes (Signed)
Unable to contact pt by phone but his PCP office confirms Dr. Nolene Ebbs is indeed pt's PCP. Pt last seen on 06/19/22 and has appt agin on 09/29/22. Letter with normal results mailed to pt to share with Dr. Jeanie Cooks at his next appt. No further health equity team support indicated at this time.

## 2022-11-12 ENCOUNTER — Other Ambulatory Visit: Payer: Self-pay | Admitting: Internal Medicine

## 2022-11-13 LAB — COMPLETE METABOLIC PANEL WITH GFR
AG Ratio: 1.5 (calc) (ref 1.0–2.5)
ALT: 13 U/L (ref 9–46)
AST: 12 U/L (ref 10–35)
Albumin: 4.6 g/dL (ref 3.6–5.1)
Alkaline phosphatase (APISO): 66 U/L (ref 35–144)
BUN: 16 mg/dL (ref 7–25)
CO2: 19 mmol/L — ABNORMAL LOW (ref 20–32)
Calcium: 10 mg/dL (ref 8.6–10.3)
Chloride: 105 mmol/L (ref 98–110)
Creat: 1.03 mg/dL (ref 0.70–1.35)
Globulin: 3 g/dL (calc) (ref 1.9–3.7)
Glucose, Bld: 81 mg/dL (ref 65–99)
Potassium: 3.7 mmol/L (ref 3.5–5.3)
Sodium: 143 mmol/L (ref 135–146)
Total Bilirubin: 1 mg/dL (ref 0.2–1.2)
Total Protein: 7.6 g/dL (ref 6.1–8.1)
eGFR: 81 mL/min/{1.73_m2} (ref >=60)

## 2022-11-13 LAB — LIPID PANEL
Cholesterol: 152 mg/dL (ref <200)
HDL: 34 mg/dL — ABNORMAL LOW (ref >=40)
LDL Cholesterol (Calc): 82 mg/dL (calc)
Non-HDL Cholesterol (Calc): 118 mg/dL (calc) (ref <130)
Total CHOL/HDL Ratio: 4.5 (calc) (ref <5.0)
Triglycerides: 273 mg/dL — ABNORMAL HIGH (ref <150)

## 2022-11-13 LAB — CBC
HCT: 44.3 % (ref 38.5–50.0)
Hemoglobin: 14.3 g/dL (ref 13.2–17.1)
MCH: 20.5 pg — ABNORMAL LOW (ref 27.0–33.0)
MCHC: 32.3 g/dL (ref 32.0–36.0)
MCV: 63.4 fL — ABNORMAL LOW (ref 80.0–100.0)
Platelets: 248 10*3/uL (ref 140–400)
RBC: 6.99 10*6/uL — ABNORMAL HIGH (ref 4.20–5.80)
RDW: 19.8 % — ABNORMAL HIGH (ref 11.0–15.0)
WBC: 8 10*3/uL (ref 3.8–10.8)

## 2022-11-13 LAB — TSH: TSH: 0.81 mIU/L (ref 0.40–4.50)

## 2022-11-13 LAB — VITAMIN D 25 HYDROXY (VIT D DEFICIENCY, FRACTURES): Vit D, 25-Hydroxy: 103 ng/mL — ABNORMAL HIGH (ref 30–100)

## 2023-06-25 ENCOUNTER — Ambulatory Visit: Payer: Medicare Other | Admitting: Cardiology

## 2023-07-01 ENCOUNTER — Ambulatory Visit (INDEPENDENT_AMBULATORY_CARE_PROVIDER_SITE_OTHER): Payer: 59 | Admitting: Cardiology

## 2023-07-01 ENCOUNTER — Encounter (HOSPITAL_BASED_OUTPATIENT_CLINIC_OR_DEPARTMENT_OTHER): Payer: Self-pay | Admitting: Cardiology

## 2023-07-01 VITALS — BP 124/76 | HR 73 | Ht 66.0 in | Wt 191.0 lb

## 2023-07-01 DIAGNOSIS — E7800 Pure hypercholesterolemia, unspecified: Secondary | ICD-10-CM | POA: Diagnosis not present

## 2023-07-01 DIAGNOSIS — I5032 Chronic diastolic (congestive) heart failure: Secondary | ICD-10-CM | POA: Diagnosis not present

## 2023-07-01 NOTE — Patient Instructions (Signed)

## 2023-07-01 NOTE — Progress Notes (Signed)
Cardiology Office Note:  .   Date:  07/01/2023  ID:  Bradley Ray, DOB Aug 04, 1956, MRN [id number] PCP: Bradley Contras, MD  Bradley Ray Cardiologist:  Bradley Schultz, MD    History of Present Illness: .   Bradley Ray is a 67 y.o. male    AI scribe software for clinical note transcription   History of Present Illness   The patient, with a history of chronic diastolic heart failure, presents for a routine follow-up. He reports feeling 'great' and deny any new symptoms. He has lost weight, from 202 to 191 pounds, by cutting out sweets and unhealthy foods, and using an air fryer for cooking. He reports no new symptoms or concerns.       ROS: No CP, no SOB  Studies Reviewed: Marland Kitchen   EKG Interpretation Date/Time:  Thursday July 01 2023 15:16:53 EDT Ventricular Rate:  66 PR Interval:  222 QRS Duration:  100 QT Interval:  426 QTC Calculation: 446 R Axis:   3  Text Interpretation: Sinus rhythm with 1st degree A-V block When compared with ECG of 02-May-2022 21:38, Premature ventricular complexes are no longer Present PR interval has increased Borderline criteria for Inferior infarct are no longer Present Confirmed by Bradley Ray ([id number]) on 07/01/2023 3:24:07 PM    LABS LDL: 82 (11/12/2022) Cr: 1.03 Hb: 14.3 TSH: 0.8  DIAGNOSTIC Lexiscan stress test: No evidence of high-risk ischemia (03/21/2021) Echocardiogram: EF 60-65%, no significant valvular abnormalities (01/01/2021) EKG: Normal  Risk Assessment/Calculations:            Physical Exam:   VS:  BP 124/76   Pulse 73   Ht 5\' 6"  (1.676 m)   Wt 191 lb (86.6 kg)   SpO2 91%   BMI 30.83 kg/m    Wt Readings from Last 3 Encounters:  07/01/23 191 lb (86.6 kg)  04/07/22 192 lb (87.1 kg)  10/30/21 172 lb 6.4 oz (78.2 kg)    GEN: Well nourished, well developed in no acute distress NECK: No JVD; No carotid bruits CARDIAC: RRR, no murmurs, rubs, gallops RESPIRATORY:  Clear to auscultation without rales,  wheezing or rhonchi  ABDOMEN: Soft, non-tender, non-distended EXTREMITIES:  No edema; No deformity   ASSESSMENT AND PLAN: .    Assessment and Plan    Chronic Diastolic Heart Failure (NYHA Class I) CAD (calcified coronary arteries, aortic atherosclerosis on CT scan 2023) Stable with no new symptoms. Echocardiogram on 01/01/2021 showed EF 60-65% with no significant valvular abnormalities. Lexiscan stress test on 03/21/2021 showed no evidence of high-risk ischemia. On Amlodipine 10mg  and Metoprolol 50mg . -Continue current medications.  Hyperlipidemia LDL 82 on 11/12/2022. On Atorvastatin 40mg . -Continue Atorvastatin 40mg .  Weight Management Patient reports weight loss from 202 to 191 lbs through dietary changes. -Encourage continued healthy lifestyle modifications.  General Health Maintenance -Continue current medications and lifestyle modifications. -Return for routine follow-up as scheduled, 1 yr.             Signed, Bradley Schultz, MD

## 2023-08-19 ENCOUNTER — Other Ambulatory Visit: Payer: Self-pay | Admitting: Urology

## 2023-08-22 NOTE — Patient Instructions (Signed)
SURGICAL WAITING ROOM VISITATION Patients having surgery or a procedure may have no more than 2 support people in the waiting area - these visitors may rotate in the visitor waiting room.   Due to an increase in RSV and influenza rates and associated hospitalizations, children ages 48 and under may not visit patients in [facility]. If the patient needs to stay at the hospital during part of their recovery, the visitor guidelines for inpatient rooms apply.  PRE-OP VISITATION  Pre-op nurse will coordinate an appropriate time for 1 support person to accompany the patient in pre-op.  This support person may not rotate.  This visitor will be contacted when the time is appropriate for the visitor to come back in the pre-op area.  Please refer to the [facility] website for the visitor guidelines for Inpatients (after your surgery is over and you are in a regular room).  You are not required to quarantine at this time prior to your surgery. However, you must do this: Hand Hygiene often Do NOT share personal items Notify your provider if you are in close contact with someone who has COVID or you develop fever 100.4 or greater, new onset of sneezing, cough, sore throat, shortness of breath or body aches.  If you test positive for Covid or have been in contact with anyone that has tested positive in the last 10 days please notify you surgeon.    Your procedure is scheduled on:  Tuesday   August 24, 2023  Report to [facility] Main Entrance: [place] entrance where the [employer] is available.   Report to admitting at:  11:00   AM  Call this number if you have any questions or problems the morning of surgery [phone]  DO NOT EAT OR DRINK ANYTHING AFTER MIDNIGHT THE NIGHT PRIOR TO YOUR SURGERY / PROCEDURE.   FOLLOW  ANY ADDITIONAL PRE OP INSTRUCTIONS YOU RECEIVED FROM YOUR SURGEON'S OFFICE!!!   Oral Hygiene is also important to reduce your risk of infection.         Remember - BRUSH YOUR TEETH THE MORNING OF SURGERY WITH YOUR REGULAR TOOTHPASTE  Do NOT smoke after Midnight the night before surgery.  STOP TAKING all Vitamins, Herbs and supplements 1 week before your surgery.   Take ONLY these medicines the morning of surgery with A SIP OF WATER: amlodipine, Metoprolol, You may use Tylenol if needed for pain,   You may not have any metal on your body including jewelry, and body piercing  Do not wear  lotions, powders, cologne, or deodorant  Men may shave face and neck.  Contacts, Hearing Aids, dentures or bridgework may not be worn into surgery. DENTURES WILL BE REMOVED PRIOR TO SURGERY PLEASE DO NOT APPLY "Poly grip" OR ADHESIVES!!!  Patients discharged on the day of surgery will not be allowed to drive home.  Someone NEEDS to stay with you for the first 24 hours after anesthesia.  Do not bring your home medications to the hospital EXCEPT FOR YOUR ALBUTEROL INHALER. The Pharmacy will dispense medications listed on your medication list to you during your admission in the Hospital.   Please read over the following fact sheets you were given: IF YOU HAVE QUESTIONS ABOUT YOUR PRE-OP INSTRUCTIONS, PLEASE CALL [phone].   [facility] - Preparing for Surgery Before surgery, you can play an important role.  Because skin is not sterile, your skin needs to be as free of germs as possible.  You can reduce the number  of germs on your skin by washing with CHG (chlorahexidine gluconate) soap before surgery.  CHG is an antiseptic cleaner which kills germs and bonds with the skin to continue killing germs even after washing. Please DO NOT use if you have an allergy to CHG or antibacterial soaps.  If your skin becomes reddened/irritated stop using the CHG and inform your nurse when you arrive at Short Stay. Do not shave (including legs and underarms) for at least 48 hours prior to the first CHG shower.  You may shave your face/neck.  Please follow these  instructions carefully:  1.  Shower with CHG Soap the night before surgery and the  morning of surgery.  2.  If you choose to wash your hair, wash your hair first as usual with your normal  shampoo.  3.  After you shampoo, rinse your hair and body thoroughly to remove the shampoo.                             4.  Use CHG as you would any other liquid soap.  You can apply chg directly to the skin and wash.  Gently with a scrungie or clean washcloth.  5.  Apply the CHG Soap to your body ONLY FROM THE NECK DOWN.   Do not use on face/ open                           Wound or open sores. Avoid contact with eyes, ears mouth and genitals (private parts).                       Wash face,  Genitals (private parts) with your normal soap.             6.  Wash thoroughly, paying special attention to the area where your  surgery  will be performed.  7.  Thoroughly rinse your body with warm water from the neck down.  8.  DO NOT shower/wash with your normal soap after using and rinsing off the CHG Soap.            9.  Pat yourself dry with a clean towel.            10.  Wear clean pajamas.            11.  Place clean sheets on your bed the night of your first shower and do not  sleep with pets.  ON THE DAY OF SURGERY : Do not apply any lotions/deodorants the morning of surgery.  Please wear clean clothes to the hospital/surgery center.    FAILURE TO FOLLOW THESE INSTRUCTIONS MAY RESULT IN THE CANCELLATION OF YOUR SURGERY  PATIENT SIGNATURE_________________________________  NURSE SIGNATURE__________________________________  ________________________________________________________________________

## 2023-08-22 NOTE — Progress Notes (Signed)
COVID Vaccine received:  []  No [x]  Yes Date of any COVID positive Test in last 90 days:  none  PCP - Fleet Contras, MD  Cardiologist - Donato Schultz, MD   Chest x-ray - 10-25-2021  Epic EKG - 07-01-2023  Stress Test - Eugenie Birks 03-21-2021  Epic ECHO - 01-01-2021  Epic Cardiac Cath -   PCR screen: []  Ordered & Completed []   No Order but Needs PROFEND     [x]   N/A for this surgery  Surgery Plan:  [x]  Ambulatory   []  Outpatient in bed  []  Admit Anesthesia:    [x]  General  []  Spinal  []   Choice []   MAC  Bowel Prep - [x]  No  []   Yes ______  Pacemaker / ICD device [x]  No []  Yes   Spinal Cord Stimulator:[x]  No []  Yes       History of Sleep Apnea? [x]  No []  Yes   CPAP used?- [x]  No []  Yes    Does the patient monitor blood sugar?   [x]  N/A   []  No []  Yes  Patient has: [x]  NO Hx DM   []  Pre-DM   []  DM1  []   DM2  Blood Thinner / Instructions: None Aspirin Instructions:  ASA 81 mg  Patient says he was told to continue Aspirin. PST was day before surgery and he has taken his aspirin today. I called Pam and She said that he is correct, Dr. Annabell Howells wanted him to stay on ASA.   ERAS Protocol Ordered: [x]  No  []  Yes Patient is to be NPO after: Midnight Prior  Dental hx: [x]  Dentures: Upper plate  []  N/A      []  Bridge or Partial:                   []  Loose or Damaged teeth:   Activity level: Patient is able to climb a flight of stairs without difficulty; [x]  No CP  [x]  No SOB  Patient can  perform ADLs without assistance.   Anesthesia review: CHF, CKD-hx of HD in hospital, asthma, ETOH, hx cocaine abuse. Splenic infarct.   Patient denies shortness of breath, fever, cough and chest pain at PAT appointment.  Patient verbalized understanding and agreement to the Pre-Surgical Instructions that were given to them at this PAT appointment. Patient was also educated of the need to review these PAT instructions again prior to his/her surgery.I reviewed the appropriate phone numbers to call if they have any  and questions or concerns.

## 2023-08-23 ENCOUNTER — Encounter (HOSPITAL_COMMUNITY): Payer: Self-pay

## 2023-08-23 ENCOUNTER — Other Ambulatory Visit: Payer: Self-pay

## 2023-08-23 ENCOUNTER — Encounter (HOSPITAL_COMMUNITY)
Admission: RE | Admit: 2023-08-23 | Discharge: 2023-08-23 | Disposition: A | Discharge status: Home / Self Care | Payer: 59 | Source: Ambulatory Visit | Attending: Urology | Admitting: Urology

## 2023-08-23 VITALS — BP 118/68 | HR 62 | Temp 98.0°F | Resp 18 | Ht 66.0 in | Wt 190.0 lb

## 2023-08-23 DIAGNOSIS — F1010 Alcohol abuse, uncomplicated: Secondary | ICD-10-CM | POA: Insufficient documentation

## 2023-08-23 DIAGNOSIS — I5032 Chronic diastolic (congestive) heart failure: Secondary | ICD-10-CM | POA: Diagnosis not present

## 2023-08-23 DIAGNOSIS — R7989 Other specified abnormal findings of blood chemistry: Secondary | ICD-10-CM

## 2023-08-23 DIAGNOSIS — I10 Essential (primary) hypertension: Secondary | ICD-10-CM | POA: Insufficient documentation

## 2023-08-23 DIAGNOSIS — Z01818 Encounter for other preprocedural examination: Secondary | ICD-10-CM | POA: Insufficient documentation

## 2023-08-23 HISTORY — DX: Chronic diastolic (congestive) heart failure: I50.32

## 2023-08-23 LAB — CBC
HCT: 42.4 % (ref 39.0–52.0)
Hemoglobin: 13.2 g/dL (ref 13.0–17.0)
MCH: 20.3 pg — ABNORMAL LOW (ref 26.0–34.0)
MCHC: 31.1 g/dL (ref 30.0–36.0)
MCV: 65.3 fL — ABNORMAL LOW (ref 80.0–100.0)
Platelets: 217 10*3/uL (ref 150–400)
RBC: 6.49 MIL/uL — ABNORMAL HIGH (ref 4.22–5.81)
RDW: 19 % — ABNORMAL HIGH (ref 11.5–15.5)
WBC: 6.3 10*3/uL (ref 4.0–10.5)
nRBC: 0.3 % — ABNORMAL HIGH (ref 0.0–0.2)

## 2023-08-23 LAB — COMPREHENSIVE METABOLIC PANEL
ALT: 18 U/L (ref 0–44)
AST: 18 U/L (ref 15–41)
Albumin: 4.2 g/dL (ref 3.5–5.0)
Alkaline Phosphatase: 71 U/L (ref 38–126)
Anion gap: 11 (ref 5–15)
BUN: 10 mg/dL (ref 8–23)
CO2: 21 mmol/L — ABNORMAL LOW (ref 22–32)
Calcium: 9.2 mg/dL (ref 8.9–10.3)
Chloride: 107 mmol/L (ref 98–111)
Creatinine, Ser: 0.71 mg/dL (ref 0.61–1.24)
GFR, Estimated: >60 mL/min (ref >60)
Glucose, Bld: 87 mg/dL (ref 70–99)
Potassium: 3 mmol/L — ABNORMAL LOW (ref 3.5–5.1)
Sodium: 139 mmol/L (ref 135–145)
Total Bilirubin: 1.2 mg/dL — ABNORMAL HIGH (ref <1.2)
Total Protein: 7.2 g/dL (ref 6.5–8.1)

## 2023-08-23 NOTE — Progress Notes (Signed)
DISCUSSION: Bradley Ray is a 67 yo male who presents to PAT prior to CYSTOSCOPY TRANSURETHRAL INCISION OF BLADDER NECK CONTRACTURE POSSIBLE OPTILUME DILATION on 08/24/23 with Dr. Annabell Howells. PMH of former smoking (quit 2007), HTN, CAD (calcified coronary arteries, aortic atherosclerosis on CT scan 2023), HFpEF, asthma, hx of ETOH abuse, BPH, hx of prostate cancer s/p TURP.  Patient follows with Cardiology for hx of chronic diastolic CHF. Last seen in clinic on 07/01/2023. Doing well from cardiac standpoint. Echocardiogram on 01/01/2021 showed EF 60-65% with no significant valvular abnormalities. Lexiscan stress test on 03/21/2021 showed no evidence of high-risk ischemia. Advised continue medical management and f/u in 1 year.  VS: BP 118/68 Comment: right arm sitting  Pulse 62   Temp 36.7 C (Oral)   Resp 18   Ht 5\' 6"  (1.676 m)   Wt 86.2 kg   SpO2 97%   BMI 30.67 kg/m   PROVIDERS: Fleet Contras, MD Cardiology: Donato Schultz, MD  LABS: Labs reviewed: Acceptable for surgery. (all labs ordered are listed, but only abnormal results are displayed)  Labs Reviewed  COMPREHENSIVE METABOLIC PANEL  CBC     IMAGES:   EKG 07/01/23  SR with 1st degree AV block, rate 66  CV:  Echo 01/01/2021:  IMPRESSIONS     1. Left ventricular ejection fraction, by estimation, is 60 to 65%. The  left ventricle has normal function. The left ventricle has no regional  wall motion abnormalities. Left ventricular diastolic parameters are  indeterminate.   2. Right ventricular systolic function is normal. The right ventricular  size is normal. Tricuspid regurgitation signal is inadequate for assessing  PA pressure.   3. The mitral valve is normal in structure. No evidence of mitral valve  regurgitation.   4. The aortic valve is tricuspid. Aortic valve regurgitation is not  visualized. No aortic stenosis is present.   5. The inferior vena cava is normal in size with greater than 50%  respiratory variability,  suggesting right atrial pressure of 3 mmHg.   Stress test 03/21/2021: The left ventricular ejection fraction is moderately decreased (30-44%). Nuclear stress EF: 43%. Moderate global hypokinesis. There was no ST segment deviation noted during stress. Defect 1: There is a small defect of mild severity present in the apex location. No ischemia. This is an intermediate risk study based upon reduced calculated ejection fraction.    Past Medical History:  Diagnosis Date   Alcoholism (HCC)    per epic documentation long hx dependence--- (06-30-2019  per pt was drinking average 3 shots dialy until 03/ 2020, since then one beer weekly)   Arthritis    knees   Asthma    Beta thalassemia trait    per hematologist/ oncologist note- dr Lanetta Inch--  per blood work-up  dx 02/ 2017   Bladder neck contracture    BPH with obstruction/lower urinary tract symptoms    Chronic kidney disease    Hx: of Hemodyalysis   Cocaine abuse, in remission (HCC)    06-30-2019   per pt states last used 12/ 2017   COVID 11/09/2019   loss of taste and smell and appetite x 14 days all symptoms resolved   ED (erectile dysfunction)    Hepatitis    hepatitis a age 35   History of acute renal failure 12/2018   kidneys fully recovered no nephrologist per pt   History of chest pain    01-06-2019 hospital admission-- dx acute renal failure, hypotensive with associated shock liver, SIRS with tachycardia/ tachyapnea,  chf diastolic  ;   06-30-2019  per pt no chest pain since and everything resolved , followed by pcp   Hyperlipidemia    Hypertension    Prostate cancer [facility]) urologist-  dr wrenn/  oncologist-  dr Kathrynn Running   dx 07-02-2016,  T1c,  Gleason 3+3,  PSA 6.48,  48.44cc   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 (previously dr Bertis Ruddy [facility])   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  anticoagulated w/ xarelto until june 2017 changed to asa 162mg  daily/   (06-30-2019 per pt now taking ASA 81mg  )   Wears dentures     upper    Past Surgical History:  Procedure Laterality Date   CYSTOSCOPY N/A 10/15/2016   Procedure: CYSTOSCOPY FLEXIBLE;  Surgeon: Bjorn Pippin, MD;  Location: [facility];  Service: Urology;  Laterality: N/A;  NO SEEDS FOUND IN BLADDER   CYSTOSCOPY N/A 08/01/2020   Procedure: CYSTOSCOPY/removal of bladder stone and bladder neck biopsy;  Surgeon: Bjorn Pippin, MD;  Location: [facility];  Service: Urology;  Laterality: N/A;   IR FLUORO GUIDE CV LINE RIGHT  01/09/2019   IR US GUIDE VASC ACCESS RIGHT  01/09/2019   ORIF LEFT ANKLE  1980's   PROSTATE BIOPSY     RADIOACTIVE SEED IMPLANT N/A 10/15/2016   Procedure: RADIOACTIVE SEED IMPLANT/BRACHYTHERAPY IMPLANT;  Surgeon: Bjorn Pippin, MD;  Location: [facility];  Service: Urology;  Laterality: N/A;     66    SEEDS IMPLANTED   REPAIR EXTENSOR TENDON Right 06/02/2016   Procedure: RIGHT LONG FINGER EXTENSOR TENDON REPAIR;  Surgeon: Mack Hook, MD;  Location: [facility];  Service: Orthopedics;  Laterality: Right;   TOTAL KNEE ARTHROPLASTY Right 2010  approx.   TOTAL KNEE ARTHROPLASTY Left 12/31/2020   Procedure: TOTAL KNEE ARTHROPLASTY;  Surgeon: Sheral Apley, MD;  Location: [facility];  Service: Orthopedics;  Laterality: Left;   TRANSURETHRAL INCISION OF BLADDER NECK N/A 07/04/2019   Procedure: CYSTOSCOPY WITH INCISION OF BLADDER NECK CONTRACTURE, BALLOON DILITATION;  Surgeon: Bjorn Pippin, MD;  Location: [facility];  Service: Urology;  Laterality: N/A;   TRANSURETHRAL INCISION OF BLADDER NECK N/A 08/01/2020   Procedure: INCISION OF BLADDER NECK;  Surgeon: Bjorn Pippin, MD;  Location: [facility];  Service: Urology;  Laterality: N/A;  1 HR   TRANSURETHRAL RESECTION OF PROSTATE N/A 06/16/2018   Procedure: TRANSURETHRAL RESECTION OF THE PROSTATE (TURP);  Surgeon: Bjorn Pippin, MD;  Location: [facility];  Service: Urology;  Laterality: N/A;    MEDICATIONS:  acetaminophen (TYLENOL) 500 MG tablet   albuterol  (PROVENTIL HFA;VENTOLIN HFA) 108 (90 Base) MCG/ACT inhaler   amLODipine (NORVASC) 10 MG tablet   aspirin EC 81 MG tablet   atorvastatin (LIPITOR) 40 MG tablet   ergocalciferol (VITAMIN D2) 1.25 MG (50000 UT) capsule   folic acid (FOLVITE) 1 MG tablet   lidocaine (LIDODERM) 5 %   metoprolol succinate (TOPROL-XL) 50 MG 24 hr tablet   thiamine (VITAMIN B-1) 100 MG tablet   zolpidem (AMBIEN) 10 MG tablet   No current facility-administered medications for this encounter.   Marcille Blanco [facility] Short Stay/Anest[facility]iology [facility] Phone [phone] 08/23/2023 1:56 PM

## 2023-08-23 NOTE — H&P (Signed)
I have a urethral stricture.     08/16/23: Jakai returns today in f/u. His PSA is down further to 0.038 follow in a seed implant in 2018. He has a history of a BNC s/p TURP with subsequent incision. His urinary symptoms have worsened with a further reduced stream. He stopped the CIC before his last visit. He feels he empties but it is just slow. He has had no hematuria or dysuria.   02/17/2023: Lavi returns. PSA now 0.04. He has lost about 12 pounds in the past 6 months intentionally with exercise. Symptom score sheet assessment is 6 today, no new or bothersome LUTS including changes in force of stream. No longer doing CIC. No interval dysuria or gross hematuria.   08/19/22: Darel returns for the history below. He is doing well with a good stream and no longer does CIC. His IPSS is 4 with nocturia x 2. He PSA is stable at 0.051. He has mild pyuria on UA has had no hematuria or dysuria. He has no weight loss and no bone pain.   02/11/22: Byrl returns today in f/u for his history of a bladder neck contracture that was incised on 07/04/19 and in 10/21 with removal of a bladder stone. He had a TURP in 2019. He had been doing the CIC q2wks but stopped about a month ago and has continued to do well. He is voiding well with an IPSS of 1 and has had no hematuria or dysuria. He has good continence.    He has a history of prostate cancer treated with seeds in 1/18. His PSA is up minimally to 0.049 from 0.041 6 months ago and 0.075 a year ago.   At the time of his biopsy the prostate was 48ml in size. He had 4 cores with 5% Gleason 6 with 2 on the left and 2 on the right without clustering.   He has ED but failed to respond to sildenafil.    CC: AUA Questions Scoring.  HPI: Tresten Elwood is a 67 year-old male established patient who is here for evaluation.      AUA Symptom Score: More than 50% of the time he has the sensation of not emptying his bladder completely when finished urinating. 50% of the time he has  to urinate again fewer than two hours after he has finished urinating. He does not have to stop and start again several times when he urinates. Less than 20% of the time he finds it difficult to postpone urination. Almost always he has a weak urinary stream. He never has to push or strain to begin urination. He has to get up to urinate 3 times from the time he goes to bed until the time he gets up in the morning.   Calculated AUA Symptom Score: 16    ALLERGIES: Morphine Sulfate - Skin Rash    MEDICATIONS: Aspirin 81 mg tablet,chewable 1 tablet PO Daily  Metoprolol Succinate  Atorvastatin Calcium 40 mg tablet 1 tablet PO Daily  Vitamin B12  Vitamin D3 50,000 units weekly     Notes: blood pressure medication   GU PSH: Catheterize For Residual - 2021 Complex cystometrogram, w/ void pressure and urethral pressure profile studies, any technique - 2018 Complex Uroflow - 2021, 2021, 2020, 2020, 2019, 2018, 2018, 2017 Cysto Dilate Stricture (M or F) - 2021, 2021 Cysto Remove Stent FB Sim - 2021 Cystoscopy - 2020, 2019, 2018 Cystoscopy TUIP - 2020 Cystoscopy TURP - 2019 Emg surf Electrd - 2018 Intrabd  voidng Press - 2018 Prostate Needle Biopsy - 2017 Revise Bladder Neck - 2021 TRANSPERI NEEDLE PLACE, PROS - 2018       PSH Notes: left ankle surgery 6 screws 2 metal rods placed   NON-GU PSH: Surgical Pathology, Gross And Microscopic Examination For Prostate Needle - 2017 Total Knee Replacement, Right Visit Complexity (formerly GPC1X) - 02/17/2023     GU PMH: Bladder-neck stenosis/contracture - 02/17/2023, He no longer requires CIC and is voiding well. , - 08/19/2022, He is doing well and has stopped the CIC. He will keep an eye on his stream and resume if needed. , - 2023, He is doing well with q2wk CIC and is voiding without difficulty or incontinence. He has mild bacteriuria but nothing of concern. , - 2022, - 2022, He is doing well s/p incision of BNC and is able to cath without  difficulty. 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 I was able to partially dilated it today and get a foley in but he is going to need repeat incision of the BNC. I reivewed the risks of bleeding, infection, incontinence, recurrent stricture, thrombotic events and anesthetic complications. , - 2021, He has obstructive and irritative voiding symptoms. , - 2017 History of prostate cancer - 02/17/2023, His PSA is minimally increased prior to this visit. He will return in 6 months with a repeat PSA. , - 08/19/2022, He is doing well but his PSA is back up slightly. I will repeat in 6 months. , - 2023, His PSA continues to decline. I will have him return in 6 months with a PSA. , - 2022, - 2022, His PSA is back down to 0.093. I did resect some tissue but the path was benign. , - 2021, His PSA is up a little bit but his has been variable and the the CIC and possible UTI I will just continue to monitor it closely. , - 2021, PSA at f/u in 3 months. , - 2021, His PSA is down to 0.113. , - 2021, PSA continues to fall. , - 2020 (Improving), His PSA is down. He will need a repeat in 6 months. , - 2020, His PSA is up some and he has some firmness at the left base. I am going to have him return in 6 months with a PSA. , - 2020, His PSA continues to fall. , - 2019, - 2019 (Improving), His PSA has fallen nicely and I will get a repeat today and at f/u in 6 months. , - 2019 Nocturia - 02/17/2023, (Stable), - 08/19/2022, - 2019 Rising PSA after prostate cancer treatment - 08/19/2022, - 2023 (Worsening), - 2020 Prostate Cancer - 2021, - 2020, His PSA was back up slightly at his last visit. I will repeat that today., - 2020, - 2020, T1c Nx Mx Gleason 6 disease. , - 2017 Urinary Tract Inf, Unspec site, His UA looks better today. I will repeat the culture. - 2021, - 2018 Weak Urinary Stream - 2021, (Stable), - 2021 (Worsening), - 2020 (Worsening), His flow is markedly reduced and I am concerned about a stricture. I will have  him return for a flowrate and cystoscopy. , - 2020, - 2019 Dysuria - 2021, - 2019 Urethral Stricture, Unspec - 2021 Microscopic hematuria (Stable), 3-10 RBC's. - 2021, He has minimal residual microhematuria that I will follow. , - 2019 Microscopic hematuria, Urine culture. - 2021 ED due to arterial insufficiency, He has a new complaint of ED and I  will start him on sildenafil. Side effects, precautions and instructions reviewed. - 2020 Prostate nodule w/o LUTS, He is voiding well s/p TURP. - 2020 BPH w/o LUTS (Improving), He is doing very well post TURP. I will have him return in 6 months with a PSA for f/u of his prostate cancer history. - 2019 BPH w/LUTS, He didn't have much improvement with doubling the tamsulosin and has a ball valving middle lobe. I have discussed options and will get him set up for a TURP of the middle lobe. I reviewd the risks of a TURP including bleeding, infection, incontinence, stricture, need for secondary procedures, ejaculatory and erectile dysfunction, thrombotic events, fluid overload and anesthetic complications. I explained that 95% of men will have relief of the obstructive symptoms and about 70% will have relief of the irritative symptoms. - 2019 Straining on Urination - 2019 Urinary Urgency - 2019 Hemorrhagic cystitis - 2018 Urinary Retention (Improving), Utkarsh is voiding to completion but he appears to have a minimally symptomatic UTI today. I will send a culture and treat accordingly. - 2018, - 2018 (Stable), He had persistent obstruction on UDS on 4/6. I am going to get a culture today and will have him return in a week. He will remove the foley in the morning and f/u later in the day for a PVR. He will hold the oxybutynin prior to the foley removal. , - 2018 (Stable), He failed his voiding trial today and has a 3cm prostate with bilobar hyperplasia. His options are to replace the foley and give him a voiding trial in 3-4 weeks with urodynamics or consider a Urolift  procedure. I have reviewed the risks of bleeding, infection, injury to adjacent structures, incontinence, strictures, encrustation, sexual/ejaculatory dysfunction, thrombotic events and anesthetic complications. He wanted to go ahead and schedule the Urolift but after review we found that it is not a covered benefit on Medicaid. I will put him on finasteride and reviewed the side effects. he will return for urodynamics in 4 weeks with OV to follow. . A foley was replaced today. , - 2018, - 2018 Urinary Frequency - 2018 Elevated PSA - 2017, He has an elevated PSA with a benign exam. This was his first PSA. , - 2017 Testicular atrophy, He has moderate left testicular atrophy. - 2017      PMH Notes: 3/20 Liver dysfunction and renal failure.   NON-GU PMH: Pyuria/other UA findings, He has some pyuria and bacteria but no symptoms to suggest a UTI. - 08/19/2022 Alcohol abuse, in remission Arthritis Elevation of levels of liver transaminase levels Encounter for general adult medical examination without abnormal findings, Encounter for preventive health examination Hypercholesterolemia Hypertension    FAMILY HISTORY: Death - Father Hypertension - Mother Pacemaker Placement - Mother   SOCIAL HISTORY: Marital Status: Single Preferred Language: English; Ethnicity: Not Hispanic Or Latino; Race: Black or African American Current Smoking Status: Patient does not smoke anymore. Has not smoked since 05/12/2008.   Tobacco Use Assessment Completed: Used Tobacco in last 30 days? Has not drank since 10/26/2021.  Does not drink caffeine. Patient's occupation is/was disabled.     Notes: He quit drinking in January after suffering associated liver dysfunction.    REVIEW OF SYSTEMS:    GU Review Male:   Patient reports frequent urination, burning/ pain with urination, and get up at night to urinate. Patient denies hard to postpone urination, leakage of urine, stream starts and stops, trouble starting your  stream, have to strain to urinate , erection  problems, and penile pain.  Gastrointestinal (Upper):   Patient denies nausea, vomiting, and indigestion/ heartburn.  Gastrointestinal (Lower):   Patient denies diarrhea and constipation.  Constitutional:   Patient denies fever, night sweats, weight loss, and fatigue.  Skin:   Patient denies skin rash/ lesion and itching.  Eyes:   Patient denies blurred vision and double vision.  Ears/ Nose/ Throat:   Patient denies sore throat and sinus problems.  Hematologic/Lymphatic:   Patient denies swollen glands and easy bruising.  Cardiovascular:   Patient denies leg swelling and chest pains.  Respiratory:   Patient denies cough and shortness of breath.  Endocrine:   Patient denies excessive thirst.  Musculoskeletal:   Patient denies back pain and joint pain.  Neurological:   Patient denies headaches and dizziness.  Psychologic:   Patient denies depression and anxiety.   VITAL SIGNS: None   MULTI-SYSTEM PHYSICAL EXAMINATION:    Constitutional: Well-nourished. No physical deformities. Normally developed. Good grooming.  Respiratory: Normal breath sounds. No labored breathing, no use of accessory muscles.   Cardiovascular: Regular rate and rhythm. No murmur, no gallop. Normal temperature, normal extremity pulses, no swelling, no varicosities.      Complexity of Data:  Lab Test Review:   PSA  Records Review:   AUA Symptom Score, Previous Patient Records  Urine Test Review:   Urinalysis   08/06/23 02/10/23 08/05/22 02/04/22 08/06/21 02/03/21 08/15/20 03/06/20  PSA  Total PSA 0.038 ng/mL 0.040 ng/mL 0.051 ng/mL 0.049 ng/mL 0.041 ng/mL 0.075 ng/mL 0.093 ng/mL 0.34 ng/mL    04/15/16  Hormones  Testosterone, Total 274.9 pg/dL    PROCEDURES:          Visit Complexity - G2211 Chronic management         Urinalysis w/Scope Dipstick Dipstick Cont'd Micro  Color: Yellow Bilirubin: Neg mg/dL WBC/hpf: 6 - 47/QQV  Appearance: Clear Ketones: Neg mg/dL  RBC/hpf: 3 - 95/GLO  Specific Gravity: 1.020 Blood: Neg ery/uL Bacteria: Few (10-25/hpf)  pH: 6.0 Protein: 1+ mg/dL Cystals: Ca Oxalate  Glucose: Neg mg/dL Urobilinogen: 0.2 mg/dL Casts: NS (Not Seen)    Nitrites: Neg Trichomonas: Not Present    Leukocyte Esterase: 1+ leu/uL Mucous: Present      Epithelial Cells: 0 - 5/hpf      Yeast: NS (Not Seen)      Sperm: Present    Notes: Motile and nonmotile.    ASSESSMENT:      ICD-10 Details  1 GU:   Weak Urinary Stream - R39.12 Chronic, Worsening - His symptoms are consistent with a recurrent BNC. I will get him set up to go to the OR for cystoscopy and dilation vs incision of the BNC. Risks reviewed in detail.   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  4   Microscopic hematuria - R31.21 Minor - He has some pyuria, hematuria and bacteria and has a prior history of UTI's. I will get a culture.    PLAN:           Orders Labs Urine Culture          Schedule Return Visit/Planned Activity: ASAP - Schedule Surgery  Procedure: Unspecified Date - Cystoscopy TURBNC - 75643 Notes: Next available

## 2023-08-23 NOTE — Anesthesia Preprocedure Evaluation (Signed)
Anesthesia Evaluation  Patient identified by MRN, date of birth, ID band Patient awake    Reviewed: Allergy & Precautions, NPO status , Patient's Chart, lab work & pertinent test results  Airway Mallampati: II  TM Distance: >3 FB Neck ROM: Full    Dental  (+) Edentulous Upper, Edentulous Lower   Pulmonary former smoker   Pulmonary exam normal breath sounds clear to auscultation       Cardiovascular hypertension, Pt. on medications and Pt. on home beta blockers +CHF  Normal cardiovascular exam Rhythm:Regular Rate:Normal  ECHO:  1. The left ventricle has normal systolic function, with an ejection  fraction of 55-60%. The cavity size was mildly dilated. Left ventricular  diastolic Doppler parameters are consistent with impaired relaxation.   2. The right ventricle has normal systolc function. The cavity was  normal. There is no increase in right ventricular wall thickness.   3. No evidence present in the left atrial appendage.   4. There is mild to moderate mitral annular calcification present.   5. The aortic valve is tricuspid Mild sclerosis of the aortic valve.  Aortic valve regurgitation was not assessed by color flow Doppler.   6. No pulmonic valve vegetation visualized.   7. The interatrial septum appears to be lipomatous.    Neuro/Psych  PSYCHIATRIC DISORDERS      negative neurological ROS     GI/Hepatic negative GI ROS,,,(+)     substance abuse  alcohol use, Hepatitis -  Endo/Other  negative endocrine ROS    Renal/GU negative Renal ROS     Musculoskeletal  (+) Arthritis , Osteoarthritis,    Abdominal   Peds  Hematology  (+) Blood dyscrasia, anemia HLD   Anesthesia Other Findings   Reproductive/Obstetrics                              Anesthesia Physical Anesthesia Plan  ASA: 3  Anesthesia Plan: General   Post-op Pain Management: GA combined w/ Regional for post-op pain and  Minimal or no pain anticipated, Tylenol PO (pre-op)* and Celebrex PO (pre-op)*   Induction: Intravenous  PONV Risk Score and Plan: 2 and Ondansetron, Dexamethasone and Treatment may vary due to age or medical condition  Airway Management Planned: Oral ETT and LMA  Additional Equipment: None  Intra-op Plan:   Post-operative Plan: Extubation in OR  Informed Consent: I have reviewed the patients History and Physical, chart, labs and discussed the procedure including the risks, benefits and alternatives for the proposed anesthesia with the patient or authorized representative who has indicated his/her understanding and acceptance.     Dental advisory given  Plan Discussed with: CRNA and Anesthesiologist  Anesthesia Plan Comments: (See PAT note from 11/11 by Sherlie Ban PA-C DISCUSSION: Bradley Ray is a 67 yo male who presents to PAT prior to CYSTOSCOPY TRANSURETHRAL INCISION OF BLADDER NECK CONTRACTURE POSSIBLE OPTILUME DILATION on 08/24/23 with Dr. Annabell Howells. PMH of former smoking (quit 2007), HTN, CAD (calcified coronary arteries, aortic atherosclerosis on CT scan 2023), HFpEF, asthma, hx of ETOH abuse, BPH, hx of prostate cancer s/p TURP.   Patient follows with Cardiology for hx of chronic diastolic CHF. Last seen in clinic on 07/01/2023. Doing well from cardiac standpoint. Echocardiogram on 01/01/2021 showed EF 60-65% with no significant valvular abnormalities. Lexiscan stress test on 03/21/2021 showed no evidence of high-risk ischemia. Advised continue medical management and f/u in 1 year.   EKG 07/01/23   SR with 1st degree  AV block, rate 66   CV:   Echo 01/01/2021:   IMPRESSIONS     1. Left ventricular ejection fraction, by estimation, is 60 to 65%. The  left ventricle has normal function. The left ventricle has no regional  wall motion abnormalities. Left ventricular diastolic parameters are  indeterminate.   2. Right ventricular systolic function is normal. The right ventricular   size is normal. Tricuspid regurgitation signal is inadequate for assessing  PA pressure.   3. The mitral valve is normal in structure. No evidence of mitral valve  regurgitation.   4. The aortic valve is tricuspid. Aortic valve regurgitation is not  visualized. No aortic stenosis is present.   5. The inferior vena cava is normal in size with greater than 50%  respiratory variability, suggesting right atrial pressure of 3 mmHg.    Stress test 03/21/2021:  The left ventricular ejection fraction is moderately decreased (30-44%).  Nuclear stress EF: 43%. Moderate global hypokinesis.  There was no ST segment deviation noted during stress.  Defect 1: There is a small defect of mild severity present in the apex location. No ischemia.  This is an intermediate risk study based upon reduced calculated ejection fraction.    )        Anesthesia Quick Evaluation

## 2023-08-24 ENCOUNTER — Encounter (HOSPITAL_COMMUNITY): Payer: Self-pay | Admitting: Urology

## 2023-08-24 ENCOUNTER — Ambulatory Visit (HOSPITAL_BASED_OUTPATIENT_CLINIC_OR_DEPARTMENT_OTHER): Payer: 59 | Admitting: Anesthesiology

## 2023-08-24 ENCOUNTER — Encounter (HOSPITAL_COMMUNITY)
Admission: RE | Disposition: A | Discharge status: Home / Self Care | Payer: Self-pay | Source: Home / Self Care | Attending: Urology

## 2023-08-24 ENCOUNTER — Ambulatory Visit (HOSPITAL_COMMUNITY): Payer: 59 | Admitting: Medical

## 2023-08-24 ENCOUNTER — Ambulatory Visit (HOSPITAL_COMMUNITY)
Admission: RE | Admit: 2023-08-24 | Discharge: 2023-08-24 | Disposition: A | Discharge status: Home / Self Care | Payer: 59 | Attending: Urology | Admitting: Urology

## 2023-08-24 DIAGNOSIS — Z87891 Personal history of nicotine dependence: Secondary | ICD-10-CM | POA: Diagnosis not present

## 2023-08-24 DIAGNOSIS — I5032 Chronic diastolic (congestive) heart failure: Secondary | ICD-10-CM | POA: Insufficient documentation

## 2023-08-24 DIAGNOSIS — I110 Hypertensive heart disease with heart failure: Secondary | ICD-10-CM | POA: Insufficient documentation

## 2023-08-24 DIAGNOSIS — Z79899 Other long term (current) drug therapy: Secondary | ICD-10-CM | POA: Diagnosis not present

## 2023-08-24 DIAGNOSIS — N320 Bladder-neck obstruction: Secondary | ICD-10-CM

## 2023-08-24 DIAGNOSIS — N210 Calculus in bladder: Secondary | ICD-10-CM

## 2023-08-24 HISTORY — PX: TRANSURETHRAL INCISION OF BLADDER NECK: SHX6152

## 2023-08-24 SURGERY — INCISION, BLADDER NECK, TRANSURETHRAL
Anesthesia: General | Site: Bladder

## 2023-08-24 MED ORDER — MEPERIDINE HCL 50 MG/ML IJ SOLN
6.2500 mg | INTRAMUSCULAR | Status: DC | PRN
Start: 2023-08-24 — End: 2023-08-24

## 2023-08-24 MED ORDER — EPHEDRINE SULFATE-NACL 50-0.9 MG/10ML-% IV SOSY
PREFILLED_SYRINGE | INTRAVENOUS | Status: DC | PRN
  Administered 2023-08-24: 5 mg via INTRAVENOUS

## 2023-08-24 MED ORDER — SODIUM CHLORIDE 0.9% FLUSH: 3.0000 mL | Freq: Two times a day (BID) | INTRAVENOUS | Status: DC

## 2023-08-24 MED ORDER — ACETAMINOPHEN 325 MG PO TABS: 325.0000 mg | ORAL_TABLET | ORAL | Status: DC | PRN

## 2023-08-24 MED ORDER — ORAL CARE MOUTH RINSE: 15.0000 mL | Freq: Once | OROMUCOSAL | Status: AC

## 2023-08-24 MED ORDER — ONDANSETRON HCL 4 MG/2ML IJ SOLN
INTRAMUSCULAR | Status: AC
Start: 2023-08-24 — End: ?
  Filled 2023-08-24: qty 2

## 2023-08-24 MED ORDER — FENTANYL CITRATE PF 50 MCG/ML IJ SOSY: 25.0000 ug | PREFILLED_SYRINGE | INTRAMUSCULAR | Status: DC | PRN

## 2023-08-24 MED ORDER — PROPOFOL 10 MG/ML IV BOLUS
INTRAVENOUS | Status: AC
  Filled 2023-08-24: qty 20

## 2023-08-24 MED ORDER — CHLORHEXIDINE GLUCONATE 0.12 % MT SOLN
15.0000 mL | Freq: Once | OROMUCOSAL | Status: AC
  Administered 2023-08-24: 15 mL via OROMUCOSAL

## 2023-08-24 MED ORDER — FENTANYL CITRATE (PF) 100 MCG/2ML IJ SOLN
INTRAMUSCULAR | Status: AC
  Filled 2023-08-24: qty 2

## 2023-08-24 MED ORDER — MIDAZOLAM HCL 5 MG/5ML IJ SOLN
INTRAMUSCULAR | Status: DC | PRN
  Administered 2023-08-24: 2 mg via INTRAVENOUS

## 2023-08-24 MED ORDER — DEXAMETHASONE SODIUM PHOSPHATE 10 MG/ML IJ SOLN
INTRAMUSCULAR | Status: AC
Start: 2023-08-24 — End: ?
  Filled 2023-08-24: qty 1

## 2023-08-24 MED ORDER — ONDANSETRON HCL 4 MG/2ML IJ SOLN: 4.0000 mg | Freq: Once | INTRAMUSCULAR | Status: DC | PRN

## 2023-08-24 MED ORDER — PROPOFOL 10 MG/ML IV BOLUS
INTRAVENOUS | Status: DC | PRN
  Administered 2023-08-24: 150 mg via INTRAVENOUS

## 2023-08-24 MED ORDER — CEFAZOLIN SODIUM-DEXTROSE 2-4 GM/100ML-% IV SOLN
2.0000 g | INTRAVENOUS | Status: AC
  Administered 2023-08-24: 2 g via INTRAVENOUS
  Filled 2023-08-24: qty 100

## 2023-08-24 MED ORDER — ACETAMINOPHEN 160 MG/5ML PO SOLN: 325.0000 mg | ORAL | Status: DC | PRN

## 2023-08-24 MED ORDER — LIDOCAINE HCL (PF) 2 % IJ SOLN
INTRAMUSCULAR | Status: AC
  Filled 2023-08-24: qty 5

## 2023-08-24 MED ORDER — EPHEDRINE 5 MG/ML INJ
INTRAVENOUS | Status: AC
  Filled 2023-08-24: qty 5

## 2023-08-24 MED ORDER — MIDAZOLAM HCL 2 MG/2ML IJ SOLN
INTRAMUSCULAR | Status: AC
Start: 2023-08-24 — End: ?
  Filled 2023-08-24: qty 2

## 2023-08-24 MED ORDER — LIDOCAINE 2% (20 MG/ML) 5 ML SYRINGE
INTRAMUSCULAR | Status: DC | PRN
  Administered 2023-08-24: 100 mg via INTRAVENOUS

## 2023-08-24 MED ORDER — OXYCODONE HCL 5 MG/5ML PO SOLN: 5.0000 mg | Freq: Once | ORAL | Status: DC | PRN

## 2023-08-24 MED ORDER — OXYCODONE HCL 5 MG PO TABS: 5.0000 mg | ORAL_TABLET | Freq: Once | ORAL | Status: DC | PRN

## 2023-08-24 MED ORDER — DEXAMETHASONE SODIUM PHOSPHATE 10 MG/ML IJ SOLN
INTRAMUSCULAR | Status: DC | PRN
  Administered 2023-08-24: 8 mg via INTRAVENOUS

## 2023-08-24 MED ORDER — LACTATED RINGERS IV SOLN: INTRAVENOUS | Status: DC

## 2023-08-24 MED ORDER — FENTANYL CITRATE (PF) 100 MCG/2ML IJ SOLN
INTRAMUSCULAR | Status: DC | PRN
  Administered 2023-08-24 (×2): 25 ug via INTRAVENOUS

## 2023-08-24 MED ORDER — SODIUM CHLORIDE (PF) 0.9 % IJ SOLN
INTRAMUSCULAR | Status: AC
  Filled 2023-08-24: qty 20

## 2023-08-24 MED ORDER — ONDANSETRON HCL 4 MG/2ML IJ SOLN
INTRAMUSCULAR | Status: DC | PRN
  Administered 2023-08-24: 4 mg via INTRAVENOUS

## 2023-08-24 MED ORDER — SODIUM CHLORIDE 0.9 % IR SOLN
Status: DC | PRN
  Administered 2023-08-24: 3000 mL

## 2023-08-24 SURGICAL SUPPLY — 21 items
BAG DRN RND TRDRP ANRFLXCHMBR (UROLOGICAL SUPPLIES)
BAG URINE DRAIN 2000ML AR STRL (UROLOGICAL SUPPLIES) IMPLANT
BAG URO CATCHER STRL LF (MISCELLANEOUS) ×1 IMPLANT
CATH FOLEY 3WAY 30CC 22FR (CATHETERS) IMPLANT
DRAPE FOOT SWITCH (DRAPES) ×1 IMPLANT
ELECT HOOK LOOP BIPOLAR (NEEDLE) IMPLANT
ELECT REM PT RETURN 15FT ADLT (MISCELLANEOUS) ×1 IMPLANT
GLOVE SURG SS PI 8.0 STRL IVOR (GLOVE) IMPLANT
GOWN STRL REUS W/ TWL XL LVL3 (GOWN DISPOSABLE) ×1 IMPLANT
GOWN STRL REUS W/TWL XL LVL3 (GOWN DISPOSABLE) ×1
HOLDER FOLEY CATH W/STRAP (MISCELLANEOUS) IMPLANT
KIT TURNOVER KIT A (KITS) IMPLANT
LOOP CUT BIPOLAR 24F LRG (ELECTROSURGICAL) IMPLANT
MANIFOLD NEPTUNE II (INSTRUMENTS) ×1 IMPLANT
PACK CYSTO (CUSTOM PROCEDURE TRAY) ×1 IMPLANT
PAD PREP 24X48 CUFFED NSTRL (MISCELLANEOUS) ×1 IMPLANT
SYR 30ML LL (SYRINGE) IMPLANT
SYR TOOMEY IRRIG 70ML (MISCELLANEOUS)
SYRINGE TOOMEY IRRIG 70ML (MISCELLANEOUS) IMPLANT
TUBING CONNECTING 10 (TUBING) ×1 IMPLANT
TUBING UROLOGY SET (TUBING) ×1 IMPLANT

## 2023-08-24 NOTE — Discharge Instructions (Signed)
CYSTOSCOPY HOME CARE INSTRUCTIONS  Activity: Rest for the remainder of the day.  Do not drive or operate equipment today.  You may resume normal activities in one to two days as instructed by your physician.   Meals: Drink plenty of liquids and eat light foods such as gelatin or soup this evening.  You may return to a normal meal plan tomorrow.  Return to Work: You may return to work in one to two days or as instructed by your physician.  Special Instructions / Symptoms: Call your physician if any of these symptoms occur:   -persistent or heavy bleeding  -bleeding which continues after first few urination  -large blood clots that are difficult to pass  -urine stream diminishes or stops completely  -fever equal to or higher than 101 degrees Farenheit.  -cloudy urine with a strong, foul odor  -severe pain   Patient Signature:  ________________________________________________________  Nurse's Signature:  ________________________________________________________  

## 2023-08-24 NOTE — Interval H&P Note (Signed)
History and Physical Interval Note:  08/24/2023 12:03 PM  Bradley Ray  has presented today for surgery, with the diagnosis of BLADDER NECK CONTRACTURE.  The various methods of treatment have been discussed with the patient and family. After consideration of risks, benefits and other options for treatment, the patient has consented to  Procedure(s) with comments: CYSTOSCOPY TRANSURETHRAL INCISION OF BLADDER NECK CONTRACTURE POSSIBLE OPTILUME DILATION (N/A) - 1 HR FOR CASE as a surgical intervention.  The patient's history has been reviewed, patient examined, no change in status, stable for surgery.  I have reviewed the patient's chart and labs.  Questions were answered to the patient's satisfaction.     Bjorn Pippin

## 2023-08-24 NOTE — Transfer of Care (Signed)
Immediate Anesthesia Transfer of Care Note  Patient: Bradley Ray  Procedure(s) Performed: CYSTOSCOPY WITH REMOVAL OF BLADDER STONES AND RESECTION OF BLADDER NECK (Bladder)  Patient Location: PACU  Anesthesia Type:General  Level of Consciousness: awake, alert , oriented, and patient cooperative  Airway & Oxygen Therapy: Patient Spontanous Breathing and Patient connected to face mask oxygen  Post-op Assessment: Report given to RN, Post -op Vital signs reviewed and stable, and Patient moving all extremities  Post vital signs: Reviewed and stable  Last Vitals:  Vitals Value Taken Time  BP 121/105 08/24/23 1321  Temp    Pulse 72 08/24/23 1324  Resp 23 08/24/23 1324  SpO2 100 % 08/24/23 1324  Vitals shown include unfiled device data.  Last Pain:  Vitals:   08/24/23 1153  TempSrc: Oral  PainSc:          Complications: No notable events documented.

## 2023-08-24 NOTE — Op Note (Signed)
Procedure: 1.  Cystoscopy with removal of bladder stones, simple. 2.  Transurethral resection of bladder neck.  Preop diagnosis: History of bladder neck contracture with recurrent reduced stream.  Postop diagnosis: Obstructing bladder neck stones with dystrophic calcification.  Surgeon: Dr. Bjorn Pippin.  Anesthesia: General.  Specimen: Stone fragments.  Drains: None.  EBL: None.  Complications: None.  Indications: The patient is a 67 year old male with a history of a bladder neck contracture that required prior incision.  He presented to the office recently with progressively worsening urinary stream and it was felt that cystoscopy under anesthesia with possible dilation or incision of bladder neck contracture was indicated.  Procedure: He was taken the operating room was given an antibiotic.  A general anesthetic was induced.  He was placed in lithotomy position and fitted with PAS hose.  He was prepped with Betadine solution and draped in usual sterile fashion.  Cystoscopy was performed using a 21 [place] scope and 30 degree lens.  Examination revealed a normal urethra.  The external sphincter was somewhat fixed but passable.  The prostatic urethra had prior resection and there were several small stones impacted at the bladder neck that were dislodged into the bladder.  There was some calcification adherent to the bladder neck laterally on both sides as well.  He has a small prostatic middle lobe but there was no recurrent bladder neck contraction or prostatic urethral obstruction.  Examination of bladder revealed mild to moderate trabeculation without tumors, stones or inflammation.  The cystoscope was removed and a 26 French continuous-flow resectoscope sheath was placed with the aid of visual obturator.  The stones which had been dislodged into the bladder were then aspirated through the scope with the largest approximately 5 to 6 mm.  The visual obturator was replaced with an Wandra Scot  handle with a bipolar loop.  Saline was used the irrigant.  The areas of dystrophic calcification on the lateral aspects of the bladder neck were then resected down to noncalcified tissue.  The resected tissue then was removed and hemostasis was secured.  Final inspection revealed no retained tissue or stones and no active bleeding.  The prostatic urethra remain widely patent.  The scope was removed and pressure on the bladder produced an adequate stream.  He was not felt the catheter was indicated.  He was taken down from lithotomy position, his anesthetic was reversed and he was moved recovery in stable condition.  There were no complications.

## 2023-08-24 NOTE — Anesthesia Postprocedure Evaluation (Signed)
Anesthesia Post Note  Patient: Bradley Ray  Procedure(s) Performed: CYSTOSCOPY WITH REMOVAL OF BLADDER STONES AND RESECTION OF BLADDER NECK (Bladder)     Patient location during evaluation: PACU Anesthesia Type: General Level of consciousness: awake and alert Pain management: pain level controlled Vital Signs Assessment: post-procedure vital signs reviewed and stable Respiratory status: spontaneous breathing, nonlabored ventilation, respiratory function stable and patient connected to nasal cannula oxygen Cardiovascular status: blood pressure returned to baseline and stable Postop Assessment: no apparent nausea or vomiting Anesthetic complications: no   No notable events documented.  Last Vitals:  Vitals:   08/24/23 1321 08/24/23 1330  BP: (!) 121/105 130/84  Pulse: 72 75  Resp: 12 17  Temp: 36.5 C   SpO2: 98% 97%    Last Pain:  Vitals:   08/24/23 1321  TempSrc:   PainSc: 0-No pain                 Sarie Stall

## 2023-08-24 NOTE — Anesthesia Procedure Notes (Signed)
Procedure Name: LMA Insertion Date/Time: 08/24/2023 12:48 PM  Performed by: Elisabeth Cara, CRNAPre-anesthesia Checklist: Patient identified, Emergency Drugs available, Suction available, Patient being monitored and Timeout performed Patient Re-evaluated:Patient Re-evaluated prior to induction Oxygen Delivery Method: Circle system utilized Preoxygenation: Pre-oxygenation with 100% oxygen Induction Type: IV induction Ventilation: Mask ventilation without difficulty LMA: LMA with gastric port inserted LMA Size: 4.0 Number of attempts: 1 Placement Confirmation: positive ETCO2 and breath sounds checked- equal and bilateral Tube secured with: Tape Dental Injury: Teeth and Oropharynx as per pre-operative assessment

## 2023-08-25 ENCOUNTER — Encounter (HOSPITAL_COMMUNITY): Payer: Self-pay | Admitting: Urology
